# Patient Record
Sex: Male | Born: 1935 | Race: White | Hispanic: No | Marital: Single | Smoking: Former smoker
Health system: Southern US, Community
[De-identification: ages and names within clinical notes are randomized; demographics above are authoritative.]

## PROBLEM LIST (undated history)

## (undated) DIAGNOSIS — Z7901 Long term (current) use of anticoagulants: Secondary | ICD-10-CM

## (undated) DIAGNOSIS — I341 Nonrheumatic mitral (valve) prolapse: Secondary | ICD-10-CM

## (undated) DIAGNOSIS — I472 Ventricular tachycardia, unspecified: Secondary | ICD-10-CM

## (undated) DIAGNOSIS — E785 Hyperlipidemia, unspecified: Secondary | ICD-10-CM

## (undated) DIAGNOSIS — N400 Enlarged prostate without lower urinary tract symptoms: Secondary | ICD-10-CM

## (undated) HISTORY — DX: Nonrheumatic mitral (valve) prolapse: I34.1

## (undated) HISTORY — DX: Long term (current) use of anticoagulants: Z79.01

## (undated) HISTORY — DX: Benign prostatic hyperplasia without lower urinary tract symptoms: N40.0

## (undated) HISTORY — DX: Ventricular tachycardia: I47.2

## (undated) HISTORY — DX: Ventricular tachycardia, unspecified: I47.20

## (undated) HISTORY — PX: APPENDICO-VESICOSTOMY CUTANEOUS: SUR39

## (undated) HISTORY — DX: Hyperlipidemia, unspecified: E78.5

---

## 2000-08-28 ENCOUNTER — Ambulatory Visit (HOSPITAL_COMMUNITY): Admission: RE | Admit: 2000-08-28 | Discharge: 2000-08-28 | Payer: Self-pay | Admitting: *Deleted

## 2001-04-25 ENCOUNTER — Emergency Department (HOSPITAL_COMMUNITY): Admission: EM | Admit: 2001-04-25 | Discharge: 2001-04-25 | Payer: Self-pay | Admitting: Emergency Medicine

## 2001-06-17 ENCOUNTER — Emergency Department (HOSPITAL_COMMUNITY): Admission: EM | Admit: 2001-06-17 | Discharge: 2001-06-17 | Payer: Self-pay | Admitting: Emergency Medicine

## 2001-08-17 ENCOUNTER — Ambulatory Visit (HOSPITAL_COMMUNITY): Admission: RE | Admit: 2001-08-17 | Discharge: 2001-08-17 | Payer: Self-pay | Admitting: Cardiovascular Disease

## 2001-08-19 ENCOUNTER — Ambulatory Visit (HOSPITAL_COMMUNITY): Admission: RE | Admit: 2001-08-19 | Discharge: 2001-08-19 | Payer: Self-pay | Admitting: Cardiovascular Disease

## 2001-08-19 HISTORY — PX: CARDIAC CATHETERIZATION: SHX172

## 2001-10-29 ENCOUNTER — Encounter: Payer: Self-pay | Admitting: Cardiothoracic Surgery

## 2001-11-03 ENCOUNTER — Encounter (INDEPENDENT_AMBULATORY_CARE_PROVIDER_SITE_OTHER): Payer: Self-pay | Admitting: *Deleted

## 2001-11-03 ENCOUNTER — Encounter: Payer: Self-pay | Admitting: Cardiothoracic Surgery

## 2001-11-03 ENCOUNTER — Inpatient Hospital Stay (HOSPITAL_COMMUNITY): Admission: RE | Admit: 2001-11-03 | Discharge: 2001-11-08 | Payer: Self-pay | Admitting: Cardiothoracic Surgery

## 2001-11-03 HISTORY — PX: CORONARY STENT PLACEMENT: SHX1402

## 2001-11-04 ENCOUNTER — Encounter: Payer: Self-pay | Admitting: Cardiothoracic Surgery

## 2001-11-05 ENCOUNTER — Encounter: Payer: Self-pay | Admitting: Cardiothoracic Surgery

## 2003-02-01 ENCOUNTER — Encounter: Admission: RE | Admit: 2003-02-01 | Discharge: 2003-03-15 | Payer: Self-pay | Admitting: Internal Medicine

## 2009-12-26 ENCOUNTER — Ambulatory Visit: Payer: Self-pay | Admitting: Cardiology

## 2010-01-23 ENCOUNTER — Ambulatory Visit: Payer: Self-pay | Admitting: Cardiology

## 2010-02-20 ENCOUNTER — Ambulatory Visit: Payer: Self-pay | Admitting: Cardiology

## 2010-03-01 ENCOUNTER — Encounter: Admission: RE | Admit: 2010-03-01 | Discharge: 2010-03-01 | Payer: Self-pay | Admitting: Internal Medicine

## 2010-03-01 IMAGING — US US RENAL
1 series · 14 of 25 positions shown · non-contrast
Comparison: None.

CLINICAL DATA: 74-year-old with hematuria.  Evaluate for renal
abscess.

RENAL/URINARY TRACT ULTRASOUND COMPLETE

[Series 1: us renal · 0.22mm/px · 14 of 34 slices shown]
[im 1/34]
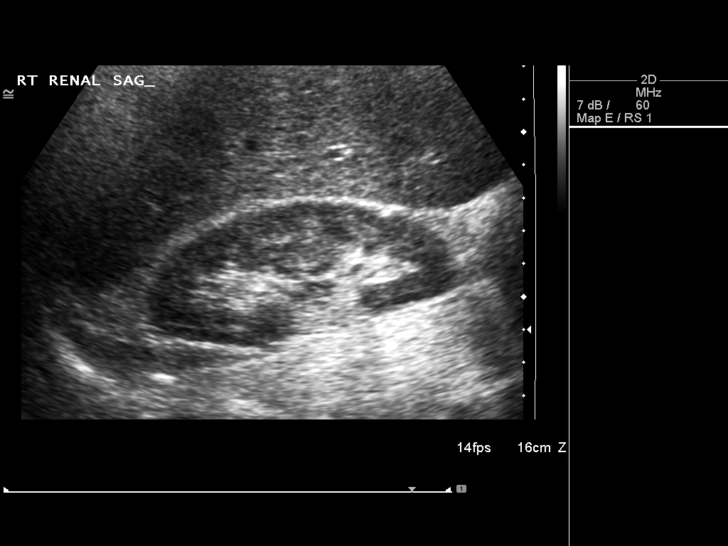
[im 3/34]
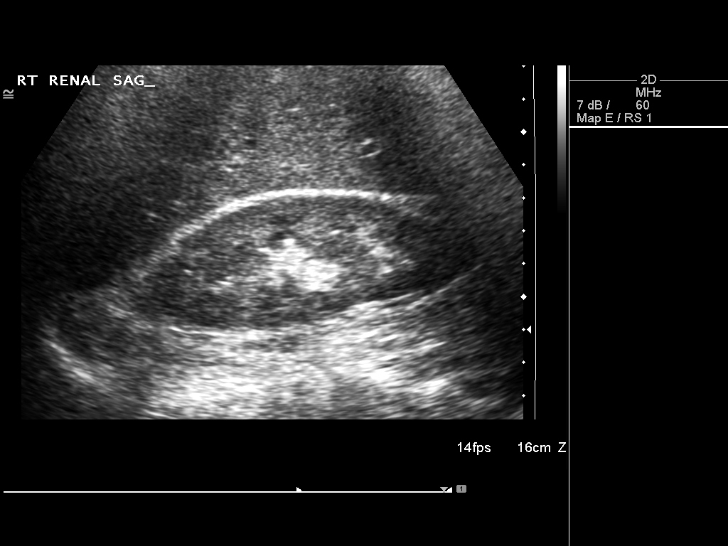
[im 6/34]
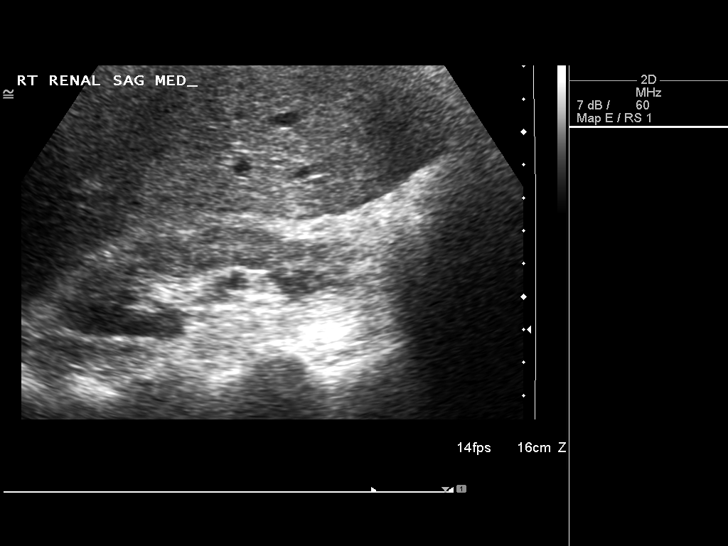
[im 9/34]
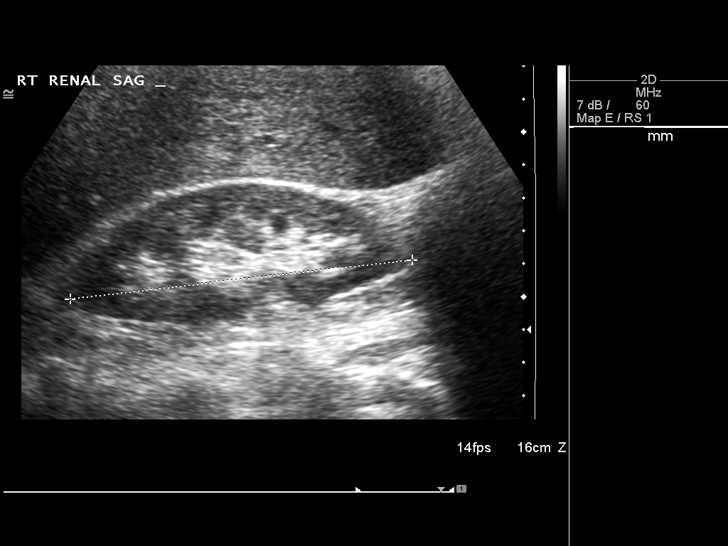
[im 12/34]
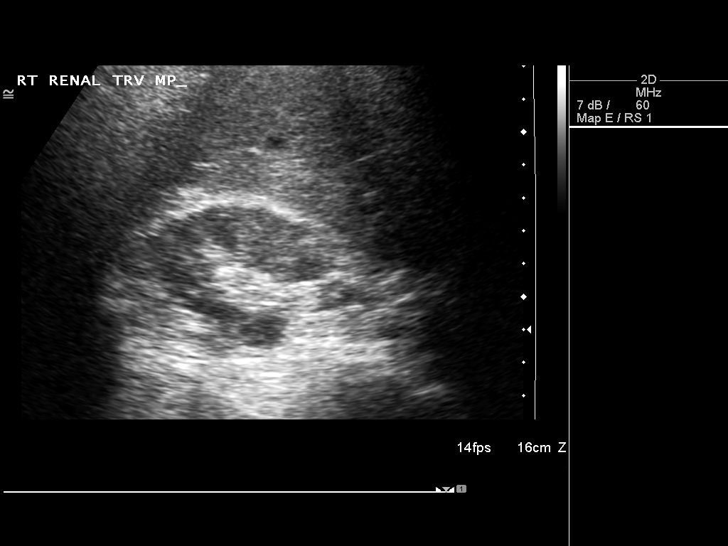
[im 13/34]
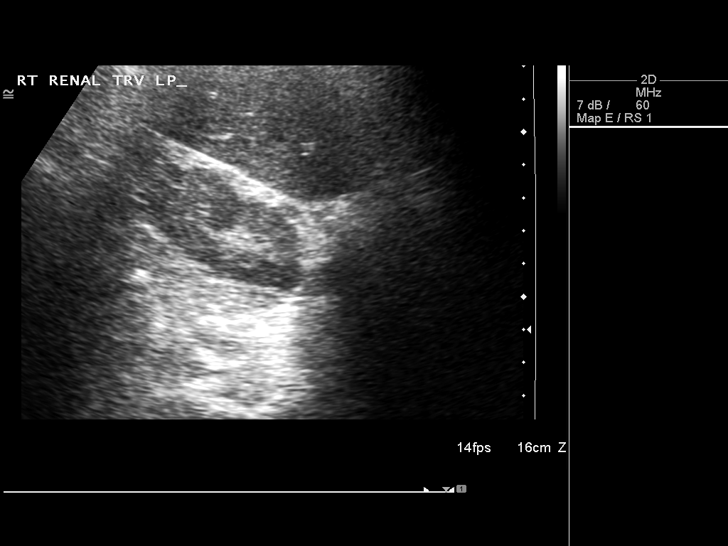
[im 16/34]
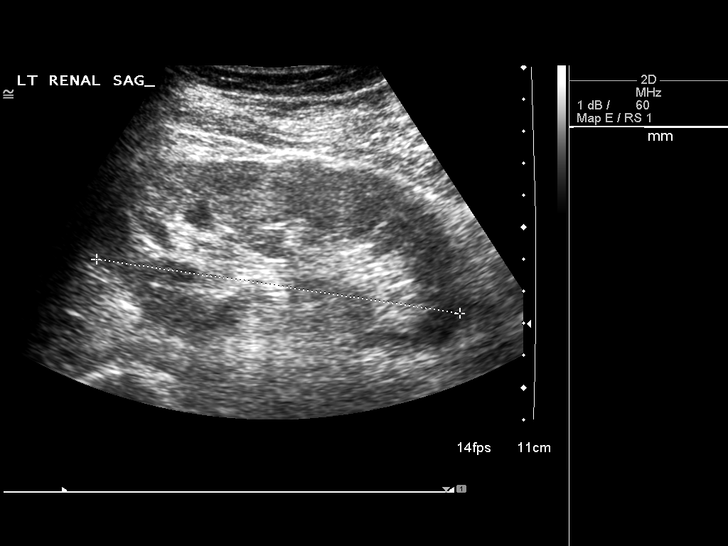
[im 18/34]
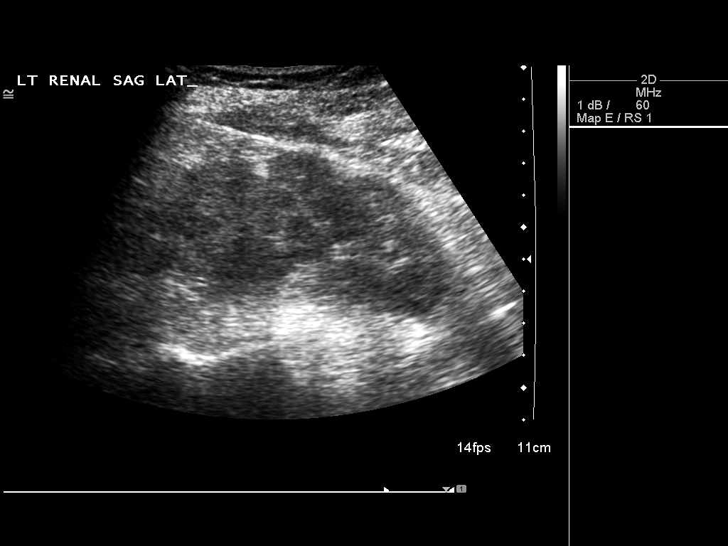
[im 21/34]
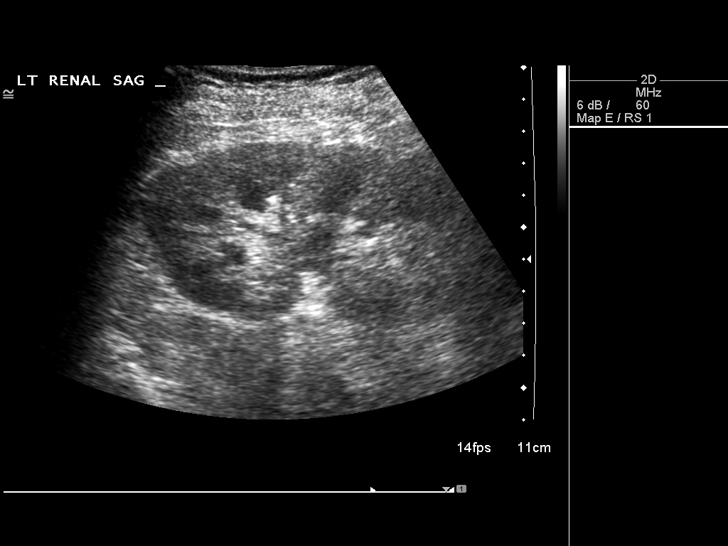
[im 23/34]
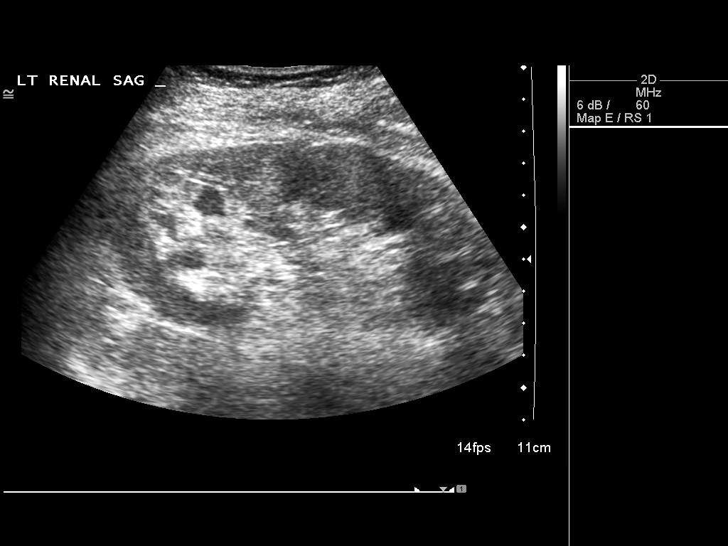
[im 25/34]
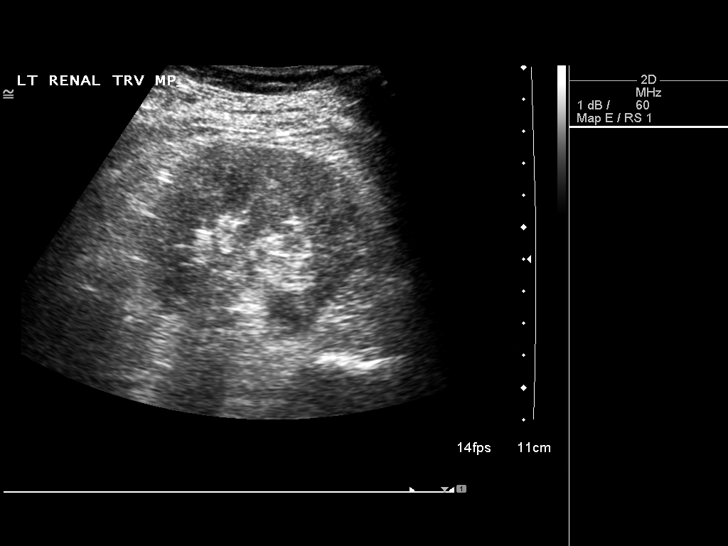
[im 28/34]
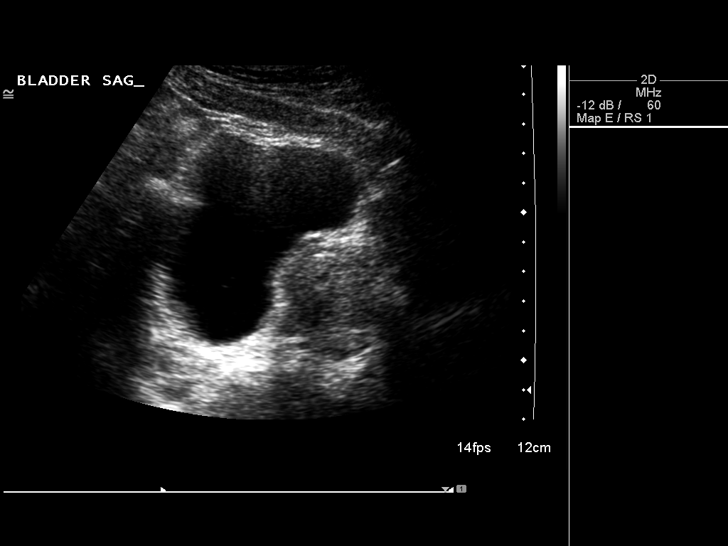
[im 31/34]
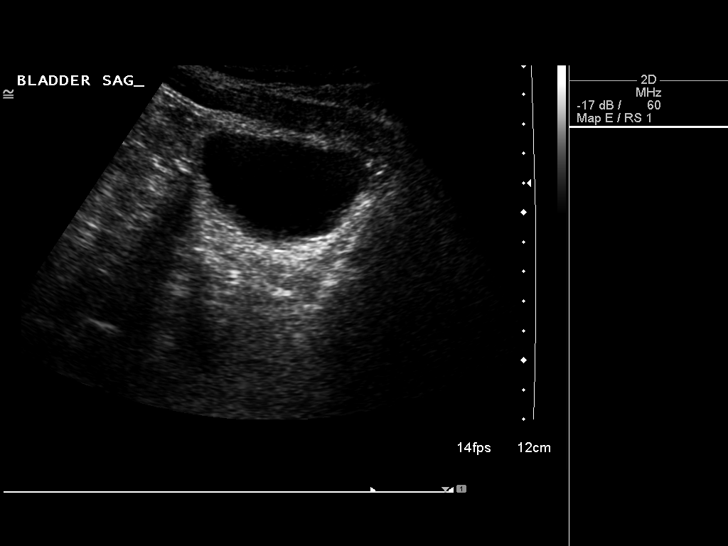
[im 34/34]
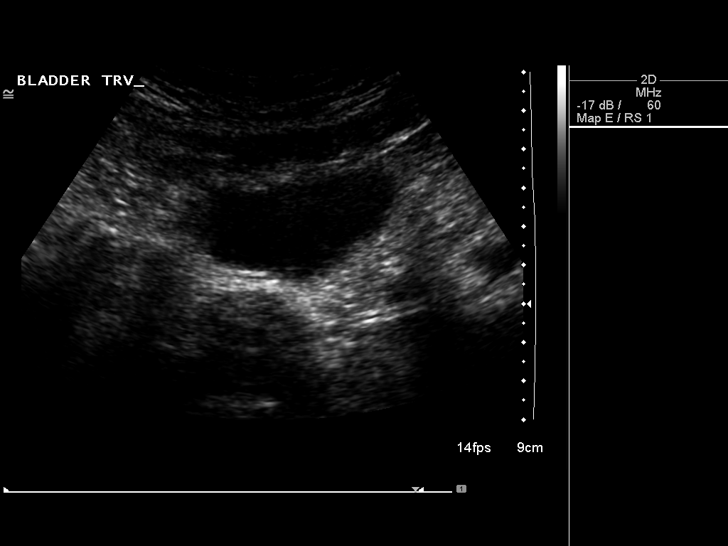

[14 of 25 positions shown; findings below may reference images not displayed]

FINDINGS: Right Kidney:  No hydronephrosis.  Well-preserved cortex.  Normal
size and parenchymal echotexture without focal abnormalities.
Renal length is 10.5 cm.

Left Kidney:  No hydronephrosis.  Well-preserved cortex.  Normal
size and parenchymal echotexture without focal abnormalities.
Renal length is 11.5 cm.

Bladder:  The bladder appears unremarkable for its degree of
distension.  Moderate enlargement prostate gland is noted.  The
prostate measures approximately 4.6 x 4.1 x 5.1 cm.
IMPRESSION: 1.  Normal renal ultrasound.  No evidence of renal abscess.
2.  Moderate enlargement of the prostate gland.
3.  Persistent unexplained hematuria may require further
evaluation.

## 2010-03-20 ENCOUNTER — Ambulatory Visit: Payer: Self-pay | Admitting: Cardiovascular Disease

## 2010-04-20 ENCOUNTER — Ambulatory Visit: Payer: Self-pay | Admitting: Cardiovascular Disease

## 2010-05-24 ENCOUNTER — Ambulatory Visit: Payer: Self-pay | Admitting: Cardiology

## 2010-06-12 ENCOUNTER — Ambulatory Visit: Payer: Self-pay | Admitting: Cardiovascular Disease

## 2010-07-06 ENCOUNTER — Other Ambulatory Visit (INDEPENDENT_AMBULATORY_CARE_PROVIDER_SITE_OTHER): Payer: Medicare Other

## 2010-07-06 DIAGNOSIS — Z954 Presence of other heart-valve replacement: Secondary | ICD-10-CM

## 2010-07-06 DIAGNOSIS — I059 Rheumatic mitral valve disease, unspecified: Secondary | ICD-10-CM

## 2010-07-20 ENCOUNTER — Other Ambulatory Visit (INDEPENDENT_AMBULATORY_CARE_PROVIDER_SITE_OTHER): Payer: Medicare Other

## 2010-07-20 DIAGNOSIS — Z954 Presence of other heart-valve replacement: Secondary | ICD-10-CM

## 2010-07-20 DIAGNOSIS — I059 Rheumatic mitral valve disease, unspecified: Secondary | ICD-10-CM

## 2010-08-03 ENCOUNTER — Other Ambulatory Visit (INDEPENDENT_AMBULATORY_CARE_PROVIDER_SITE_OTHER): Payer: Medicare Other | Admitting: *Deleted

## 2010-08-03 ENCOUNTER — Encounter: Payer: Self-pay | Admitting: *Deleted

## 2010-08-03 ENCOUNTER — Ambulatory Visit: Payer: Self-pay | Admitting: *Deleted

## 2010-08-03 DIAGNOSIS — Z952 Presence of prosthetic heart valve: Secondary | ICD-10-CM | POA: Insufficient documentation

## 2010-08-03 DIAGNOSIS — Z7901 Long term (current) use of anticoagulants: Secondary | ICD-10-CM

## 2010-08-03 DIAGNOSIS — I059 Rheumatic mitral valve disease, unspecified: Secondary | ICD-10-CM

## 2010-08-03 LAB — POCT INR: INR: 3.5

## 2010-08-03 NOTE — Patient Instructions (Signed)
Pt was instructed to stay on the same dose and increase his greens; will reck in 2 weeks; pt verbalized an understanding

## 2010-08-03 NOTE — Progress Notes (Signed)
erroneous

## 2010-08-03 NOTE — Telephone Encounter (Signed)
This encounter was created in error - please disregard.

## 2010-09-04 ENCOUNTER — Ambulatory Visit (INDEPENDENT_AMBULATORY_CARE_PROVIDER_SITE_OTHER): Payer: Medicare Other | Admitting: *Deleted

## 2010-09-04 DIAGNOSIS — I059 Rheumatic mitral valve disease, unspecified: Secondary | ICD-10-CM

## 2010-09-04 LAB — POCT INR: INR: 3.5

## 2010-09-25 ENCOUNTER — Ambulatory Visit (INDEPENDENT_AMBULATORY_CARE_PROVIDER_SITE_OTHER): Payer: Medicare Other | Admitting: *Deleted

## 2010-09-25 DIAGNOSIS — I059 Rheumatic mitral valve disease, unspecified: Secondary | ICD-10-CM

## 2010-09-28 NOTE — Cardiovascular Report (Signed)
[place]. [facility]  Patient:    Terry [name], Terry [name] Visit Number: [id number] MRN: [id number]          Service Type: CAT Location: [facility] 2853 01 Attending Physician:  Koren Bound Dictated by:   [name]., M.D. Proc. Date: 08/19/01 Admit Date:  08/19/2001   CC:         Ramon Dredge B. Tyrone Sage, M.D.  [name]., M.D.  Cardiac Catheterization Laboratory   Cardiac Catheterization  INDICATIONS: The patient is a 75 year old gentleman with a history of mitral valve prolapse - status post mitral valve repair approximately five years ago at another hospital. He presented to me a year or so ago with at least moderate mitral regurgitation. He has had some progressive enlargement of his left ventricle and is referred for a heart catheterization for further evaluation.  His recent echocardiograms reveal significant mitral regurgitation. A recent transesophageal echocardiogram reveals prolapse particularly of the posterior leaflet. There is at least moderate to severe mitral regurgitation.  The patient was referred for a right and left heart catheterization for further evaluation.  The right femoral artery and the right femoral vein were easily cannulated using a modified Seldinger technique.  HEMODYNAMICS: Right atrial pressure was a mean of 3, right ventricular pressure is 30/4, pulmonary artery pressure is 28/9 with a mean of 18. Pulmonary capillary wedge pressure is a mean of 10.  Left ventricular pressure is 89/8, the aortic pressure is 89/50.  ANGIOGRAPHY: The left main coronary artery is smooth and normal.  The left anterior descending artery is essentially normal. There are a few minor luminal irregularities in the mid LAD. The LAD reaches around the apex and supplies the inferoapical wall. There is a large diagonal branch which arises fairly early. This diagonal branch is normal.  There is a large ramus intermedius  branch which is fairly normal. There is a high marginal branch which arises very early on from the circumflex artery. This branch is fairly normal.  The left circumflex artery continues around the AV groove and gives off several smaller posterolateral branches. All of these branches are normal.  The right coronary artery is relatively smooth and normal. It is large and dominant. The posterior descending artery and the posterolateral segment arteries are normal.  LEFT VENTRICULOGRAM: The left ventricular size is at the upper limits of normal. There is moderate mitral regurgitation. The left atrium is significantly enlarged. There is no evidence of aortic stenosis.  COMPLICATIONS: None.  CONCLUSIONS: 1. Essentially smooth and normal coronary arteries. 2. Overall well preserved left ventricular systolic function with at least    moderate mitral regurgitation. The mitral regurgitation actually appeared    to be milder than it had been on the previous echocardiograms. We will    review the case with the patient and with Dr. Tyrone Sage. It is likely    that the patient will need a re-do mitral valve repair or a mitral valve    replacement in the near future. Dictated by:   [name]., M.D. Attending Physician:  Koren Bound DD:  08/19/01 TD:  08/19/01 Job: [id number]

## 2010-09-28 NOTE — Procedures (Signed)
[place]. [facility]  Patient:    ELVIN, MCCARTIN Visit Number: [id number] MRN: [id number]          Service Type: SUR Location: 2000 2019 01 Attending Physician:  Waldo Laine Dictated by:   Judie Petit, M.D. Proc. Date: 11/03/01 Admit Date:  11/03/2001                             Procedure Report  INDICATIONS: Mr. Junker is a 75 year old male, who presents today for mitral valve replacement by Dr. Sheliah Plane.  A TEE probe will be used for pre and post mitral valve replacement.  PROCEDURE: Pre-cardiopulmonary bypass transesophageal echocardiogram.  ANESTHESIOLOGIST: Judie Petit, M.D.  DESCRIPTION OF PROCEDURE: The patient was brought to the holding area on the morning of surgery.  A pulmonary artery catheter and A line catheter were inserted under local anesthesia without difficulty.  The patient was moved to the operating room for routine induction of general anesthesia.  The patient was intubated.  Following intubation of the trachea the TEE probe was passed oropharyngeally into the stomach and then withdrawn for imaging of the cardiac structures.  LEFT VENTRICLE - Papillary muscles were well outlined.  No masses were noted within the left ventricular chamber.  Left ventricular size appeared within normal limits.  The patient appeared to have relatively normal ejection fraction.  AORTIC VALVE - The aortic valve was trileaflet in nature.  On Doppler examination there was no evidence of regurgitation.  MITRAL VALVE - There appeared to be thickening of the mitral valve.  There appeared to be significant prolapsing of the posterior leaflet as well as some prolapsing of the anterior leaflet.  There was moderate to severe mitral regurgitation on Doppler examination.  LEFT ATRIUM - The left atrium was dilated.  The left atrial appendage was normal.  There were no masses noted.  The atrial septum appeared intact.  RIGHT  VENTRICLE - The right ventricle and tricuspid valve was somewhat difficult to visualize.  RIGHT ATRIUM - The right atrium appeared normal.  The patient was placed on cardiopulmonary bypass by Dr. Tyrone Sage.  Hypothermia was instituted.  The diseased mitral valve was excised and replaced with a #35 St. Jude valve.  De-airing maneuvers were carried out at the end of the procedure.  The patient was subsequently rewarmed and separated from cardiopulmonary bypass with the initial attempt.  CARDIOPULMONARY BYPASS TRANSESOPHAGEAL ECHOCARDIOGRAM: A limited examination.  MITRAL VALVE - The #35 St. Jude valve appeared to function with no significant obstruction to flow.  Overall the valve appeared to be seated well and function satisfactorily.  LEFT VENTRICLE - In the early bypass period the left ventricle chamber revealed low volume; however, with replacement of adequate volume the contractile pattern improved.  The rest of the cardiac examination was as previously described. Dictated by:   Judie Petit, M.D. Attending Physician:  Waldo Laine DD:  11/03/01 TD:  11/05/01 Job: [id number]

## 2010-09-28 NOTE — Discharge Summary (Signed)
[place]. [facility]  Patient:    Terry [name], Terry [name] Visit Number: [id number] MRN: [id number]          Service Type: SUR Location: 2000 2019 01 Attending Physician:  Waldo Laine Dictated by:   Tollie Pizza Collins, P.A.-C. Admit Date:  11/03/2001 Discharge Date: 11/08/2001   CC:         [name]., M.D.  Rodrigo Ran, M.D.   Discharge Summary  PRIMARY ADMITTING DIAGNOSES:  Mitral regurgitation status post previous mitral valve repair in 1995.  ADDITIONAL DIAGNOSES: 1. Hyperlipidemia, diet controlled. 2. History of previous ventricular tachycardia converted to normal sinus    rhythm with cardioversion. 3. Osteoporosis. 4. Benign prostatic hypertrophy. 5. 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  PROCEDURE PERFORMED: 1. Redo median sternotomy. 2. Mitral valve replacement with #35 St. Jude mitral valve. 3. Closure of left atrial appendage.  HISTORY:  The patient is a 75 year old white male who is status post previous mitral valve repair in [place] in 1995.  At that time the anterior and posterior leaflets were repaired but no ring was placed.  He has been followed by Dr. Elease Hashimoto and on most recent echocardiogram was found to have posterior leaflet flail and prolapse of the anterior leaflet.  There has been an increase in left ventricular size and worsening of his mitral regurgitation. He was evaluated in the office by Dr. Tyrone Sage and it was felt that his significant mitral regurgitation warranted mitral valve replacement.  HOSPITAL COURSE:  He was admitted on June 24.  Taken to the operating room where he underwent redo median sternotomy with mitral valve replacement with a #35 St. Jude valve.  He also underwent closure of the left atrial appendage. He tolerated procedure well and was transferred to the SICU in stable condition.  He was extubated shortly after  surgery and was hemodynamically stable on postoperative day #1.  At that time he was started on Coumadin and was transferred to the floor.  Postoperatively he has done well.  He has maintained normal sinus rhythm.  He has been afebrile and all other vital signs have been stable.  He has been ambulating in the halls without difficulty with cardiac rehabilitation phase I.  He has been weaned off supplemental oxygen and is maintaining O2 saturations over 90% on room air. He has been mildly anemic during her postoperative course; however, his hemoglobin and hematocrit have remained relatively stable and at present are at 8.3 and 24.6 respectively.  He is tolerating a regular diet and having normal bowel and bladder function.  His incisions are healing well.  He has been appropriately anticoagulated with Coumadin.  It is felt if he remains stable he will be ready for discharge home on November 07, 2001.  DISCHARGE MEDICATIONS: 1. Toprol XL 25 mg q.d. 2. Aspirin 81 mg q.d. 3. Tylox one to two q.4h. p.r.n. for pain. 4. Amiodarone 400 mg b.i.d. 5. Coumadin dose to be determined by PT and INR on day of discharge.  DISCHARGE INSTRUCTIONS:  He is to refrain from driving, heavy lifting, or strenuous activity.  He may continue a low fat, low sodium diet.  He is asked to shower daily and clean his incisions with soap and water.  DISCHARGE FOLLOWUP:  He will see Dr. Elease Hashimoto in the office in the next two weeks and have chest x-ray at that time.  He is to have PT and  INR drawn first of next week and call to Dr. Namon Cirri office for further Coumadin dosing. CVTS office will call and schedule an appointment with Dr. Tyrone Sage in three weeks.  He is asked to bring his chest x-ray to this appointment and he is asked to call if he has any problems in the interim. Dictated by:   Tollie Pizza Collins, P.A.-C. Attending Physician:  Waldo Laine DD:  11/06/01 TD:  11/09/01 Job: [id number]

## 2010-09-28 NOTE — H&P (Signed)
[place]. [facility]  Patient:    Terry [name], Terry [name] Visit Number: [id number] MRN: [id number]          Service Type: SUR Location: 2300 2310 01 Attending Physician:  Waldo Laine Dictated by:   Dominica Severin, P.A. Admit Date:  11/03/2001   CC:         CVTS office   History and Physical  DATE OF BIRTH:  1936-04-15  CHIEF COMPLAINT:  Mitral regurgitation status post repair of mitral valve in 1995.  HISTORY OF PRESENT ILLNESS:  This is a 75 year old Caucasian male who was referred by Dr. Elease Hashimoto for evaluation of mitral valve regurgitation.  He is status post mitral valve repair in [place] in 1995 with repair of the anterior and posterior leaflets.  There is no ring placed.  He now has posterior leaflet flailing and prolapsing of the anterior leaflet.  He has a long-standing history of mitral valve prolapse after he developed a ruptured cord and underwent mitral valve repair in 1995 at [facility] in [place].  He had been followed by Dr. Elease Hashimoto since returning to the [place] area.  Serial echocardiograms showed increase in left ventricular size and worsening of mitral regurgitation.  He was relatively asymptomatic. He walks five times a week three miles a day without any significant shortness of breath.  He denies any dyspnea on exertion, resting shortness of breath, chest pain, lower extremity edema, syncope, or exertional orthopnea.  He has had several episodes of presyncope in 1995 right after surgery, but has not had any since and he has had two episodes of significant tachycardia which he was told was ventricular in origin, but none since being on Toprol.  He denies any angina.  Denies any polyuria, nocturia, hematuria, abdominal fullness, hematochezia, cough, or sputum production.  PAST MEDICAL HISTORY: 1. Mitral valve repair status post cord A tendon rupture. 2. Osteoporosis. 3. History of ventricular  tachycardia requiring shocks, resolved. 4. Benign prostatic hypertrophy. 5. Hyperlipidemia, diet controlled.  PAST SURGICAL HISTORY: 1. Mitral valve repair in 1995 in [place]. 2. Appendectomy in 1960s. 3. Status post left green stick fracture as a child.  MEDICATIONS: 1. Lotensin 5 mg daily. 2. Toprol 100 mg daily. 3. Digoxin 250 mcg daily. 4. Actonel 35 mg q.weekly. 5. Aspirin 81 mg daily.  ALLERGIES:  No known drug allergies.  REVIEW OF SYSTEMS:  Please see HPI for significant positives.  Otherwise, the patient denies any changes in warts, moles, recent rashes, eczema, seborrhea, psoriasis, easy bruising or bleeding.  Denies any brittleness in his nails, ridging, pitting, or curvature.  Denies any difficulty with his lymph nodes such as enlargement or pain.  He denies any fractures, dislocations, sprain, arthritis, myositis, pain, swelling, muscle weakness, wasting, or night cramps.  Denies any history of anemia or spontaneous bleeding.  Denies any history of diabetes, thyroid problems.  He does state he has a history of painless migraines.  Denies any current headaches, trauma, dizziness, fainting, or convulsions.  He wears glasses.  Denies any visual loss or double vision.  He has constant tinnitus all the time which he attributes to using a shotgun frequently as a child.  He has some postnasal drainage.  Otherwise, denies any epistaxis.  Denies any soreness in his mouth, tongue, or problems with his teeth.  He sees a Education officer, community regularly.  Denies any sore throat or difficulty swallowing.  He has a history of vertebral AVM.  Lungs and heart: Please see HPI.  GASTROINTESTINAL:  Denies any changes in appetite, weight changes, difficulty swallowing, nausea, abdominal pain, bleeding in his bowels.  He does have some difficulty starting his stream with urination.  He has a history of benign prostatic hypertrophy.  He denies any weakness or numbness in any of his extremities on  one side of his body more than the other or any history of TIAs or CVAs or amaurosis fugax.  He denies any history of kidney disease.  FAMILY HISTORY:  The patients mother is deceased.  She had a history of rheumatoid arthritis.  The patients father is deceased at 59 chronic lymphocytic leukemia.  Brother is alive at 51 years old.  He has a history of tuberculosis.  He has one brother who passed away at 9 in Oct 03, 1983 of hepatitis C.  Otherwise, there is no history of coronary artery disease, pulmonary disease, diabetes, CVA, or TIAs.  SOCIAL HISTORY:  The patient is single.  Did not have any children.  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  He drinks an occasional one glass of wine a month.  He denies any tobacco use currently. He quit in 10-02-64.  He smoked less than one pack per day for seven to eight years.  PHYSICAL EXAMINATION  VITAL SIGNS:  Blood pressure 118/80, pulse 76, respirations 16.  GENERAL:  This is a 75 year old Caucasian male who is sitting upright on the examination table in our office today in no acute distress.  He is alert and oriented x3.  HEENT:  Head is normocephalic, atraumatic.  Eyes are PERRLA/EOMI without cataracts, glaucoma, or macular degeneration.  He does wear glasses.  NECK:  Supple without JVD, bruits.  He does have bilateral soft thrills throughout his neck.  No lymphadenopathy.  CHEST:  Symmetrical on inspiration.  Demonstrates a well healed sternotomy incision.  LUNGS:  Without wheezes, rhonchi, rales, lymphadenopathy.  HEART:  Regular rate and rhythm.  He has a 3/6 systolic ejection murmur notable in the mitral area that radiates up into the axilla.  There are no rubs or gallops.  ABDOMEN:  Soft, nontender with positive bowel sounds x4 quadrants without any masses or bruits.  GENITOURINARY:  Deferred.  RECTAL:  Deferred.  EXTREMITIES:  Without clubbing, cyanosis, edema, or ulcerations.   The  temperature is warm.  He has 2+ carotid, femoral, popliteal, dorsalis pedis, and posterior tibial pulses bilaterally.  NEUROLOGIC:  There are no focal deficits.  He has a steady gait.  He has deep tendon reflexes that are 2+ and muscle strength 4+ bilaterally.  ASSESSMENT AND PLAN:  Progressive mitral valve disease.  He is for redo mitral valve replacement and/or repair on November 03, 2001 at [facility].  Dr. Tyrone Sage has seen and evaluated this patient prior to this admission and has explained the risks and benefits of procedure and the patient has agreed to continued. Dictated by:   Dominica Severin, P.A. Attending Physician:  Waldo Laine DD:  10/29/01 TD:  10/30/01 Job: [id number]

## 2010-09-28 NOTE — Op Note (Signed)
[facility]  Patient:    Terry [name], Terry [name] Visit Number: [id number] MRN: [id number]          Service Type: SUR Location: 2000 2019 01 Attending Physician:  Waldo Laine Dictated by:   Gwenith Daily Tyrone Sage, M.D. Proc. Date: 11/03/01 Admit Date:  11/03/2001   CC:         [name]., M.D.   Operative Report  PREOPERATIVE DIAGNOSIS:  Recurrent severe mitral regurgitation, status post mitral valve repair.  POSTOPERATIVE DIAGNOSIS:  Recurrent severe mitral regurgitation, status post mitral valve repair.  OPERATION PERFORMED:  Mitral valve replacement with #35 St. Jude mechanical mitral valve prosthesis and closure of left atrial appendage.  SURGEON:  Gwenith Daily. Tyrone Sage, M.D.  FIRST ASSISTANT:  Gina H. Thomasena Edis, P.A.  ANESTHESIA:  General.  INDICATIONS FOR PROCEDURE:  The patient is a 75 year old male who in 1995 underwent mitral valve repair while living in [place].  At that time a repair was performed on the posterior leaflet, no mitral valve ring was placed.  The patient subsequently moved to the [facility] and has been followed by Dr. Delane Ginger and noted to have increasing mitral regurgitation with symptoms.  In addition serial echocardiogram showed some enlarging of the ventricle and decrease in left ventricular function.  Because of these findings, the patient was referred for surgery.  Cardiac catheterization was performed which demonstrated clear coronary arteries.  The patient each time evaluated had been in sinus rhythm and had not been on Coumadin.  After reviewing the cath films and echocardiogram, the risks and options were discussed with the patient including the possible need for mitral valve replacement and lifelong Coumadin therapy.  The patient had no contraindications to Coumadin and was agreeable with this approach.  DESCRIPTION OF PROCEDURE:  With Swan-Ganz and arterial line monitors in  place, the patient underwent general endotracheal anesthesia without incident.  A transesophageal echocardiogram probe was placed by Dr. Randa Evens.  The skin of the chest and legs was prepped with Betadine and draped in the usual sterile manner.  With a sagittal saw, a median sternotomy was performed.  Previously, the pericardium had not been closed.  Carefully, the right ventricle was dissected off the posterior table of the sternum.  The right atrium and ascending aorta were dissected free.  The anterior wall of the heart was dissected free, the posterior left ventricle was left with the superior inferior vena cava freed.  The patient was then systemically heparinized.  The ascending aorta was cannulated.  The cannulas were placed through the right atrium into the superior and inferior vena cava.  A retrograde cardioplegia catheter was placed in the coronary sinus through a separate pursestring suture in the right atrium.  The patient was placed on cardiopulmonary bypass at 2.4 L per minute per meter squared.  The patients body temperature was then cooled to 30 degrees.  The aortic crossclamp was applied.  500 cc of cold blood potassium cardioplegia was administered antegrade with rapid diastolic arrest of the heart.  The atrium was then opened through the previous left atriotomy suture line.  The left atrium was enlarged.  The mitral valve was inspected.  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 Jude mechanical valve.  The posterior leaflet was fibrotic and fused.  Areas of previous repair could be noted between 3 and 6 oclock on the valve annulus was a significant amount of calcification.  The hingepoint of the anterior leaflet, it was decided that the valve was not repairable.  The anterior leaflet was excised saving two paddles of anterior leaflet with chordal attachments in place.  A portion of  the posterior leaflet was also left intact.  The area of significant calcification was carefully debrided to the extent to allow seating of the valve.  Care was taken not to disrupt the atrioventricular junction.  The valve was then sized for a #35 St. Jude mechanical mitral valve prosthesis.  A pledgeted #2 Tycron sutures were placed circumferentially with the pledgets on the atrial side.  The care was taken throughout the procedure to remove all, any loose calcific debris.  The valve was then seated in place and secured.  There were free movements of the leaflets.  The valve seating and pledgets were all inspected including using a dental mirror.  A small Fogarty catheter was placed through the central lumen of the mitral valve.  The left aortotomy was closed with a horizontal mattress 3-0 Prolene suture.  Prior to complete closure of the suture line, the heart was allowed to passively fill.  The Foley catheter through the mitral valve was then removed and the aortotomy closure completed.  The head was put in the down position.  Further deairing maneuvers were carried out with the aortic root vent in place.  The aortic crossclamp was removed with a total crossclamp time of 132 minutes.  A 16 gauge needle was introduced into the left ventricular apex to further deair the heart.  The patient required electrical defibrillation and returned to a sinus rhythm.  Body temperature rewarmed to 37 degrees. The patient was then ventilated and weaned from cardiopulmonary bypass without difficulty.  He remained hemodynamically stable.  He was decannulated in the usual fashion.  Protamine sulfate was administered.  With the operative field hemostatic, two atrial and two ventricular pacing wires were applied.  Mediastinal tissue was closed over the ascending aorta.  There was not sufficient tissue to close over the right ventricle with exception of pleural tissue.  Two mediastinal tubes were left in  place.  The sternum was closed with #6 stainless steel wire, the fascia was closed with interrupted 0  Vicryl and running 3-0 Vicryl in the subcutaneous tissue and a 4-0 subcuticular stitch in the skin edges.  Dry dressings were applied.  The sponge and needle counts were reported as correct at the completion of the procedure.  Please note while the atrium was opened and inspected, there was no free clot both on TEE and direct observation.  The left atrial appendage was sufficient size to decrease the possibility of postoperative clots.  Using a running 4-0 Prolene suture, the left atrial appendage opening was suture obliterated. Dictated by:   Gwenith Daily Tyrone Sage, M.D. Attending Physician:  Waldo Laine DD:  11/04/01 TD:  11/05/01 Job: [id number]

## 2010-10-09 ENCOUNTER — Encounter: Payer: Self-pay | Admitting: Internal Medicine

## 2010-10-16 ENCOUNTER — Ambulatory Visit (INDEPENDENT_AMBULATORY_CARE_PROVIDER_SITE_OTHER): Payer: Medicare Other | Admitting: *Deleted

## 2010-10-16 DIAGNOSIS — I059 Rheumatic mitral valve disease, unspecified: Secondary | ICD-10-CM

## 2010-10-30 ENCOUNTER — Ambulatory Visit: Payer: Medicare Other | Admitting: Cardiovascular Disease

## 2010-11-01 ENCOUNTER — Encounter: Payer: Self-pay | Admitting: *Deleted

## 2010-11-09 ENCOUNTER — Ambulatory Visit (INDEPENDENT_AMBULATORY_CARE_PROVIDER_SITE_OTHER): Payer: Medicare Other | Admitting: Cardiovascular Disease

## 2010-11-09 ENCOUNTER — Ambulatory Visit (INDEPENDENT_AMBULATORY_CARE_PROVIDER_SITE_OTHER): Payer: Medicare Other | Admitting: Cardiology

## 2010-11-09 ENCOUNTER — Encounter: Payer: Self-pay | Admitting: Cardiovascular Disease

## 2010-11-09 VITALS — BP 104/70 | HR 80 | Ht 72.0 in | Wt 161.4 lb

## 2010-11-09 DIAGNOSIS — I059 Rheumatic mitral valve disease, unspecified: Secondary | ICD-10-CM

## 2010-11-09 DIAGNOSIS — Z7901 Long term (current) use of anticoagulants: Secondary | ICD-10-CM

## 2010-11-09 DIAGNOSIS — Z954 Presence of other heart-valve replacement: Secondary | ICD-10-CM

## 2010-11-09 DIAGNOSIS — Z952 Presence of prosthetic heart valve: Secondary | ICD-10-CM

## 2010-11-09 LAB — POCT INR: INR: 2.8

## 2010-11-09 NOTE — Progress Notes (Signed)
Terry [name] Date of Birth  1935-08-13 [facility] [address]     [address] [place], [place]  [address] [phone]  Fax  [phone]  History of Present Illness:  Terry [name] is a day 75 year old gentleman with a history of mitral valve replacement. He has done very well. His INR Levels have been therapeutic. He has not had any problems.  Current Outpatient Prescriptions on File Prior to Visit  Medication Sig Dispense Refill  . ACTONEL 150 MG tablet Take 1 tablet by mouth every 30 (thirty) days.      . calcium carbonate (OS-CAL) 600 MG TABS Take 600 mg by mouth daily.        . Cholecalciferol (VITAMIN D3) 1000 UNITS tablet Take 1,000 Units by mouth daily.        . CRESTOR 20 MG tablet Take 1 tablet by mouth Daily.      . metoprolol (TOPROL-XL) 100 MG 24 hr tablet Take 150 mg by mouth Daily.      . multivitamin (THERAGRAN) per tablet Take 1 tablet by mouth daily.        . niacin 500 MG tablet Take 500 mg by mouth 2 (two) times daily with a meal.        . omeprazole (PRILOSEC OTC) 20 MG tablet Take 20 mg by mouth every other day.        . propranolol (INDERAL) 10 MG tablet Take 10 mg by mouth as needed.        . warfarin (COUMADIN) 5 MG tablet Take by mouth. As directed by the Coumadin Clinic         Allergies  Allergen Reactions  . Sulfa Drugs Cross Reactors     Past Medical History  Diagnosis Date  . Mitral valve prolapse   . Dyslipidemia   . Long-term (current) use of anticoagulants   . Hyperlipidemia   . Ventricular tachycardia   . Osteoporosis   . Benign prostatic hypertrophy     Past Surgical History  Procedure Date  . Cardiac catheterization 08/19/2001    Essentially smooth and normal coronary arteries  . Coronary stent placement 11/03/2001    Mitral valve replacement with #35 St. Jude mechanical mitral valve prosthesis and closure of  left atrial appendage  . Appendico-vesicostomy cutaneous     History  Smoking status   . Former Smoker  . Quit date: 05/13/1964  Smokeless tobacco  . Not on file    History  Alcohol Use     History reviewed. No pertinent family history.  Reviw of Systems:  Reviewed in the HPI.  All other systems are negative.  Physical Exam: BP 104/70  Pulse 80  Ht 6' (1.829 m)  Wt 161 lb 6.4 oz (73.211 kg)  BMI 21.89 kg/m2 The patient is alert and oriented x 3.  The mood and affect are normal.   Skin: warm and dry.  Color is normal.    HEENT:   the sclera are nonicteric.  The mucous membranes are moist.  The carotids are 2+ without bruits.  There is no thyromegaly.  There is no JVD.    Lungs: clear.  The chest wall is non tender.    Heart: regular rate with a mechanical S1 and S2.  There are no murmurs, gallops, or rubs. The PMI is not displaced.     Abdomin: good bowel sounds.  There is no guarding or rebound.  There is no hepatosplenomegaly or tenderness.  There are no  masses.   Extremities:  no clubbing, cyanosis, or edema.  The legs are without rashes.  The distal pulses are intact.   Neuro:  Cranial nerves II - XII are intact.  Motor and sensory functions are intact.    The gait is normal.  Assessment / Plan:

## 2010-11-09 NOTE — Assessment & Plan Note (Signed)
He remains on Coumadin. His INR levels have been therapeutic. He is not having problems. I'll continue to see him every 6 months.

## 2010-11-12 ENCOUNTER — Other Ambulatory Visit: Payer: Self-pay | Admitting: Cardiology

## 2010-11-13 ENCOUNTER — Encounter: Payer: Medicare Other | Admitting: *Deleted

## 2010-11-13 ENCOUNTER — Telehealth: Payer: Self-pay | Admitting: *Deleted

## 2010-11-13 ENCOUNTER — Encounter: Payer: Self-pay | Admitting: Cardiovascular Disease

## 2010-11-13 NOTE — Telephone Encounter (Signed)
Faxed dr Dulce Sellar [phone] recommendation. See scanned paperwork. Cc to dr mark perini

## 2010-11-13 NOTE — Telephone Encounter (Signed)
escribe medication per fax request  

## 2010-11-16 ENCOUNTER — Encounter: Payer: Self-pay | Admitting: Cardiovascular Disease

## 2010-11-26 ENCOUNTER — Telehealth: Payer: Self-pay | Admitting: Cardiovascular Disease

## 2010-11-26 NOTE — Telephone Encounter (Signed)
[facility]NICO[facility]. PT IS HAVING A PROCEDURE AND SHE NEEDS TO GET DR NAHSER TO WRITE SCRIPT FOR PT TO GET LOVENOX IN LIEN OF HIS BLOODTHINNER. THIS HAS PREVIOUSLY DISCUSSED WITH DR Elease Hashimoto AND Jarold Song MD. PLEASE CALL BACK TODAY.

## 2010-11-26 NOTE — Telephone Encounter (Signed)
No date set yet for procedure/ colonoscopy but pt contacted and he does know how to do Lovenox bridge, re taught how to give and how often to give self sq injection, pt feels comfortable with inj. Nicole/ Dr Dulce Sellar nurse to call us when app set so we can order Lovenox and arrange f/u app for INR. All instructions reviewed with pt and i will re teach on final call. Terry Ramus RN

## 2010-12-07 ENCOUNTER — Telehealth: Payer: Self-pay | Admitting: *Deleted

## 2010-12-07 ENCOUNTER — Ambulatory Visit (INDEPENDENT_AMBULATORY_CARE_PROVIDER_SITE_OTHER): Payer: Medicare Other | Admitting: *Deleted

## 2010-12-07 DIAGNOSIS — I059 Rheumatic mitral valve disease, unspecified: Secondary | ICD-10-CM

## 2010-12-07 MED ORDER — ENOXAPARIN SODIUM 80 MG/0.8ML ~~LOC~~ SOLN: 70.0000 mg | Freq: Two times a day (BID) | SUBCUTANEOUS | Status: DC

## 2010-12-07 NOTE — Telephone Encounter (Signed)
Pt called and reviewed meds, coumadin to be held 12/29/10, start lovenox 70mg  12/31/10 BID only one dose morning of 01/02/11  For colonoscopy 01/03/11, will need inr 01/07/11 pt will restart coumadin 01/03/11 evening and will continue lovenox that evening if low risk of bleeding like no biopsies have been done, otherwise will start lovenox on morning of 01/04/11. Endo will advise, Coumadin clinic/ emily called and clarified dates. Pt verbalized understanding. Alfonso Ramus RN

## 2010-12-24 ENCOUNTER — Telehealth: Payer: Self-pay | Admitting: Cardiovascular Disease

## 2010-12-24 NOTE — Telephone Encounter (Signed)
Pt is scheduled for protime tomorrow and he is supposed to go off coumadin for colonoscopy and wants to know does he really need to come in to have protime check

## 2010-12-24 NOTE — Telephone Encounter (Signed)
Coumadin clinic contacted and they will call the pt back.

## 2010-12-25 ENCOUNTER — Encounter: Payer: Medicare Other | Admitting: *Deleted

## 2011-01-07 ENCOUNTER — Ambulatory Visit (INDEPENDENT_AMBULATORY_CARE_PROVIDER_SITE_OTHER): Payer: Medicare Other | Admitting: *Deleted

## 2011-01-07 DIAGNOSIS — I059 Rheumatic mitral valve disease, unspecified: Secondary | ICD-10-CM

## 2011-02-05 ENCOUNTER — Ambulatory Visit (INDEPENDENT_AMBULATORY_CARE_PROVIDER_SITE_OTHER): Payer: Medicare Other | Admitting: *Deleted

## 2011-02-05 DIAGNOSIS — I059 Rheumatic mitral valve disease, unspecified: Secondary | ICD-10-CM

## 2011-02-19 ENCOUNTER — Ambulatory Visit (INDEPENDENT_AMBULATORY_CARE_PROVIDER_SITE_OTHER): Payer: Medicare Other | Admitting: *Deleted

## 2011-02-19 DIAGNOSIS — I059 Rheumatic mitral valve disease, unspecified: Secondary | ICD-10-CM

## 2011-03-01 ENCOUNTER — Ambulatory Visit (INDEPENDENT_AMBULATORY_CARE_PROVIDER_SITE_OTHER): Payer: Medicare Other | Admitting: *Deleted

## 2011-03-01 DIAGNOSIS — I059 Rheumatic mitral valve disease, unspecified: Secondary | ICD-10-CM

## 2011-03-15 ENCOUNTER — Ambulatory Visit (INDEPENDENT_AMBULATORY_CARE_PROVIDER_SITE_OTHER): Payer: Medicare Other | Admitting: *Deleted

## 2011-03-15 DIAGNOSIS — I059 Rheumatic mitral valve disease, unspecified: Secondary | ICD-10-CM

## 2011-03-27 ENCOUNTER — Telehealth: Payer: Self-pay | Admitting: Cardiovascular Disease

## 2011-03-27 NOTE — Telephone Encounter (Signed)
Forwarded to coumadin clinic

## 2011-03-27 NOTE — Telephone Encounter (Signed)
New problem:  Per Sonia calling from [facility]. Patient need to be bridge on Lovenox prior to biposy - schedule on 12/6.  Last office note will faxed over.

## 2011-03-28 NOTE — Telephone Encounter (Signed)
Pt has appt with Coumadin clinic on 11/23.  Will set up Lovenox bridge at that time.

## 2011-04-05 ENCOUNTER — Encounter: Payer: Medicare Other | Admitting: *Deleted

## 2011-04-09 ENCOUNTER — Ambulatory Visit (INDEPENDENT_AMBULATORY_CARE_PROVIDER_SITE_OTHER): Payer: Medicare Other | Admitting: *Deleted

## 2011-04-09 DIAGNOSIS — I059 Rheumatic mitral valve disease, unspecified: Secondary | ICD-10-CM

## 2011-04-09 LAB — POCT INR: INR: 3.5

## 2011-04-09 MED ORDER — ENOXAPARIN SODIUM 80 MG/0.8ML ~~LOC~~ SOLN: 80.0000 mg | Freq: Two times a day (BID) | SUBCUTANEOUS | Status: DC

## 2011-04-09 NOTE — Patient Instructions (Addendum)
Last dose of Coumadin on 04/12/11. 04/13/11 No Coumadin, No Lovenox 04/14/11 No Coumadin, Lovenox 70mg  in then am, 70mg  in the pm 04/15/11 No Coumadin, Lovenox 70mg  in then am, 70mg  in the pm 04/16/11 No Coumadin, Lovenox 70mg  in then am, 70mg  in the pm 04/17/11 No Coumadin, Lovenox 70mg  in then am, nothing in the pm prior to procedure on 04/18/11. Resume Coumadin and Lovenox post procedure per MD instruction. Once resumes take an extra 1/2 tablet x 2 doses then resume normal dosage.

## 2011-04-11 ENCOUNTER — Telehealth: Payer: Self-pay | Admitting: Cardiovascular Disease

## 2011-04-11 ENCOUNTER — Telehealth: Payer: Self-pay | Admitting: *Deleted

## 2011-04-11 NOTE — Telephone Encounter (Signed)
New problem:  Bioposy  On 04/18/11. pls advise on coumadin.

## 2011-04-11 NOTE — Telephone Encounter (Signed)
Please see note/ telephone call from 03/27/11.

## 2011-04-11 NOTE — Telephone Encounter (Signed)
[facility] to let [facility] know he is being bridged.

## 2011-04-11 NOTE — Telephone Encounter (Signed)
Pt called about biopsy, forwarded to cc and sally putt but noted he was bridged in clinic and is at bottom of printout, called msg to call back if needs clarification.

## 2011-04-18 NOTE — Telephone Encounter (Signed)
Spoke with pt.  Had biopsy this morning.  Stated he took a Lovenox injection this AM prior to procedure per surgeon's instructions.  Unsure if this was correct or not.  MD stated he could start Coumadin and Lovenox tomorrow.  He will start Lovenox BID in AM and Coumadin tomorrow night.  He has appt to recheck INR on 12/11.

## 2011-04-18 NOTE — Telephone Encounter (Signed)
Sent to coumadin clinic, i see his inr last visit and bridging is at bottom of sheet but is unclear. Please advise pt, thank you.

## 2011-04-18 NOTE — Telephone Encounter (Signed)
New Msg: Pt calling wanting to know how long he should stay on lovenox after he starts taking his coumadin. Please return pt call to discuss further.

## 2011-04-23 ENCOUNTER — Ambulatory Visit (INDEPENDENT_AMBULATORY_CARE_PROVIDER_SITE_OTHER): Payer: Medicare Other | Admitting: *Deleted

## 2011-04-23 DIAGNOSIS — I059 Rheumatic mitral valve disease, unspecified: Secondary | ICD-10-CM

## 2011-04-23 LAB — POCT INR: INR: 1.7

## 2011-05-03 ENCOUNTER — Ambulatory Visit (INDEPENDENT_AMBULATORY_CARE_PROVIDER_SITE_OTHER): Payer: Medicare Other | Admitting: *Deleted

## 2011-05-03 DIAGNOSIS — I059 Rheumatic mitral valve disease, unspecified: Secondary | ICD-10-CM

## 2011-05-24 ENCOUNTER — Ambulatory Visit (INDEPENDENT_AMBULATORY_CARE_PROVIDER_SITE_OTHER): Payer: Medicare Other | Admitting: *Deleted

## 2011-05-24 DIAGNOSIS — I059 Rheumatic mitral valve disease, unspecified: Secondary | ICD-10-CM

## 2011-05-24 LAB — POCT INR: INR: 3.5

## 2011-06-03 ENCOUNTER — Other Ambulatory Visit: Payer: Self-pay | Admitting: Pharmacist

## 2011-06-03 MED ORDER — WARFARIN SODIUM 5 MG PO TABS: ORAL_TABLET | ORAL | Status: DC

## 2011-06-21 ENCOUNTER — Ambulatory Visit (INDEPENDENT_AMBULATORY_CARE_PROVIDER_SITE_OTHER): Payer: Medicare Other | Admitting: *Deleted

## 2011-06-21 DIAGNOSIS — I059 Rheumatic mitral valve disease, unspecified: Secondary | ICD-10-CM

## 2011-06-21 LAB — POCT INR: INR: 2.8

## 2011-07-19 ENCOUNTER — Ambulatory Visit (INDEPENDENT_AMBULATORY_CARE_PROVIDER_SITE_OTHER): Payer: Medicare Other

## 2011-07-19 DIAGNOSIS — I059 Rheumatic mitral valve disease, unspecified: Secondary | ICD-10-CM

## 2011-08-16 ENCOUNTER — Ambulatory Visit (INDEPENDENT_AMBULATORY_CARE_PROVIDER_SITE_OTHER): Payer: Medicare Other | Admitting: Pharmacist

## 2011-08-16 DIAGNOSIS — Z7901 Long term (current) use of anticoagulants: Secondary | ICD-10-CM

## 2011-08-16 DIAGNOSIS — I059 Rheumatic mitral valve disease, unspecified: Secondary | ICD-10-CM

## 2011-08-16 LAB — POCT INR: INR: 2.9

## 2011-09-27 ENCOUNTER — Ambulatory Visit (INDEPENDENT_AMBULATORY_CARE_PROVIDER_SITE_OTHER): Payer: Medicare Other

## 2011-09-27 DIAGNOSIS — Z7901 Long term (current) use of anticoagulants: Secondary | ICD-10-CM

## 2011-09-27 DIAGNOSIS — I059 Rheumatic mitral valve disease, unspecified: Secondary | ICD-10-CM

## 2011-11-08 ENCOUNTER — Ambulatory Visit (INDEPENDENT_AMBULATORY_CARE_PROVIDER_SITE_OTHER): Payer: Medicare Other | Admitting: *Deleted

## 2011-11-08 DIAGNOSIS — Z7901 Long term (current) use of anticoagulants: Secondary | ICD-10-CM

## 2011-11-08 DIAGNOSIS — I059 Rheumatic mitral valve disease, unspecified: Secondary | ICD-10-CM

## 2011-11-11 ENCOUNTER — Other Ambulatory Visit: Payer: Self-pay | Admitting: *Deleted

## 2011-11-11 MED ORDER — WARFARIN SODIUM 5 MG PO TABS: ORAL_TABLET | ORAL | Status: DC

## 2011-12-20 ENCOUNTER — Ambulatory Visit (INDEPENDENT_AMBULATORY_CARE_PROVIDER_SITE_OTHER): Payer: Medicare Other

## 2011-12-20 DIAGNOSIS — Z7901 Long term (current) use of anticoagulants: Secondary | ICD-10-CM

## 2011-12-20 DIAGNOSIS — I059 Rheumatic mitral valve disease, unspecified: Secondary | ICD-10-CM

## 2011-12-20 LAB — POCT INR: INR: 2.6

## 2012-01-24 ENCOUNTER — Encounter: Payer: Self-pay | Admitting: Cardiovascular Disease

## 2012-01-28 ENCOUNTER — Ambulatory Visit (INDEPENDENT_AMBULATORY_CARE_PROVIDER_SITE_OTHER): Payer: Medicare Other | Admitting: Cardiovascular Disease

## 2012-01-28 ENCOUNTER — Encounter: Payer: Self-pay | Admitting: Cardiovascular Disease

## 2012-01-28 VITALS — BP 118/76 | HR 84 | Ht 72.0 in | Wt 160.1 lb

## 2012-01-28 DIAGNOSIS — Z954 Presence of other heart-valve replacement: Secondary | ICD-10-CM

## 2012-01-28 DIAGNOSIS — Z952 Presence of prosthetic heart valve: Secondary | ICD-10-CM

## 2012-01-28 DIAGNOSIS — I059 Rheumatic mitral valve disease, unspecified: Secondary | ICD-10-CM

## 2012-01-28 NOTE — Assessment & Plan Note (Signed)
Terry [name] is doing very well. His valve sounds are nice and crisp. His INR  levels have been therapeutic.  I'll see him again in one year. He will call me if he has any problems.

## 2012-01-28 NOTE — Progress Notes (Signed)
Terry [name] Date of Birth  1935-07-13 [facility] [address]     [address] [place], [place]  [address] [phone]  Fax  [phone]   Problem list: 1. Mitral valve replacement 2. Hyperlipidemia  History of Present Illness:  Terry [name] is a day 76 year old gentleman with a history of mitral valve replacement. He has done very well. His INR Levels have been therapeutic. He has not had any problems.  He is doing very well.  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   Current Outpatient Prescriptions on File Prior to Visit  Medication Sig Dispense Refill  . ACTONEL 150 MG tablet Take 1 tablet by mouth every 30 (thirty) days.      . Calcium Carbonate-Vitamin D (CALCIUM + D PO) Take 600 mg by mouth daily.      . Cholecalciferol (VITAMIN D3) 1000 UNITS tablet Take 1,000 Units by mouth daily.        . CRESTOR 20 MG tablet Take 1 tablet by mouth Daily.      Marland Kitchen enoxaparin (LOVENOX) 80 MG/0.8ML SOLN injection Inject 0.8 mLs (80 mg total) into the skin every 12 (twelve) hours.  12 Syringe  1  . metoprolol (TOPROL-XL) 100 MG 24 hr tablet TAKE 1 1/2 TABLET BY MOUTH DAILY  140 tablet  11  . multivitamin (THERAGRAN) per tablet Take 1 tablet by mouth daily.        . niacin 500 MG tablet Take 500 mg by mouth 2 (two) times daily with a meal.        . omeprazole (PRILOSEC OTC) 20 MG tablet Take 20 mg by mouth every other day.        . propranolol (INDERAL) 10 MG tablet Take 10 mg by mouth as needed.        . warfarin (COUMADIN) 5 MG tablet Take as directed by Anticoagulation clinic.  90 tablet  1    Allergies  Allergen Reactions  . Sulfa Drugs Cross Reactors     Past Medical History  Diagnosis Date  . Mitral valve prolapse   . Dyslipidemia   . Long-term (current) use of anticoagulants   . Hyperlipidemia   . Ventricular tachycardia   . Osteoporosis   . Benign prostatic hypertrophy     Past Surgical History    Procedure Date  . Cardiac catheterization 08/19/2001    Essentially smooth and normal coronary arteries  . Coronary stent placement 11/03/2001    Mitral valve replacement with #35 St. Jude mechanical mitral valve prosthesis and closure of  left atrial appendage  . Appendico-vesicostomy cutaneous     History  Smoking status  . Former Smoker  . Quit date: 05/13/1964  Smokeless tobacco  . Not on file    History  Alcohol Use     No family history on file.  Reviw of Systems:  Reviewed in the HPI.  All other systems are negative.  Physical Exam: BP 118/76  Pulse 84  Ht 6' (1.829 m)  Wt 160 lb 1.9 oz (72.63 kg)  BMI 21.72 kg/m2 The patient is alert and oriented x 3.  The mood and affect are normal.   Skin: warm and dry.  Color is normal.    HEENT:   the sclera are nonicteric.  The mucous membranes are moist.  The carotids are 2+ without bruits.  There is no thyromegaly.  There is no JVD.    Lungs: clear.  The chest wall is non tender.    Heart: regular rate with a mechanical S1 and S2.  There are no murmurs, gallops, or rubs. The PMI is not displaced.     Abdomin: good bowel sounds.  There is no guarding or rebound.  There is no hepatosplenomegaly or tenderness.  There are no masses.   Extremities:  no clubbing, cyanosis, or edema.  The legs are without rashes.  The distal pulses are intact.   Neuro:  Cranial nerves II - XII are intact.  Motor and sensory functions are intact.    The gait is normal.  EKG: 01/28/2012-normal sinus rhythm at 84 beats a minute. He is minimal voltage criteria for left ventricular hypertrophy. He has nonspecific ST and T wave changes. Assessment / Plan:

## 2012-01-28 NOTE — Patient Instructions (Addendum)
Your physician wants you to follow-up in: 1 year. You will receive a reminder letter in the mail two months in advance. If you don't receive a letter, please call our office to schedule the follow-up appointment.  

## 2012-01-31 ENCOUNTER — Encounter: Payer: Self-pay | Admitting: Cardiovascular Disease

## 2012-01-31 ENCOUNTER — Ambulatory Visit (INDEPENDENT_AMBULATORY_CARE_PROVIDER_SITE_OTHER): Payer: Medicare Other | Admitting: Pharmacist

## 2012-01-31 DIAGNOSIS — Z7901 Long term (current) use of anticoagulants: Secondary | ICD-10-CM

## 2012-01-31 DIAGNOSIS — I059 Rheumatic mitral valve disease, unspecified: Secondary | ICD-10-CM

## 2012-02-03 ENCOUNTER — Other Ambulatory Visit: Payer: Self-pay | Admitting: *Deleted

## 2012-02-03 MED ORDER — METOPROLOL SUCCINATE ER 100 MG PO TB24: ORAL_TABLET | ORAL | Status: DC

## 2012-02-03 NOTE — Telephone Encounter (Signed)
Fax Received. Refill Completed. Terry [name] (R.M.A)   

## 2012-02-04 ENCOUNTER — Other Ambulatory Visit: Payer: Self-pay | Admitting: Cardiovascular Disease

## 2012-02-04 NOTE — Telephone Encounter (Signed)
Fax Received. Refill Completed. Korynn Kenedy Chowoe (R.M.A)   

## 2012-03-13 ENCOUNTER — Ambulatory Visit (INDEPENDENT_AMBULATORY_CARE_PROVIDER_SITE_OTHER): Payer: Medicare Other

## 2012-03-13 DIAGNOSIS — Z7901 Long term (current) use of anticoagulants: Secondary | ICD-10-CM

## 2012-03-13 DIAGNOSIS — I059 Rheumatic mitral valve disease, unspecified: Secondary | ICD-10-CM

## 2012-04-24 ENCOUNTER — Ambulatory Visit (INDEPENDENT_AMBULATORY_CARE_PROVIDER_SITE_OTHER): Payer: Medicare Other

## 2012-04-24 DIAGNOSIS — I059 Rheumatic mitral valve disease, unspecified: Secondary | ICD-10-CM

## 2012-04-24 DIAGNOSIS — Z7901 Long term (current) use of anticoagulants: Secondary | ICD-10-CM

## 2012-04-24 LAB — POCT INR: INR: 3.9

## 2012-05-22 ENCOUNTER — Ambulatory Visit (INDEPENDENT_AMBULATORY_CARE_PROVIDER_SITE_OTHER): Payer: Medicare Other | Admitting: *Deleted

## 2012-05-22 DIAGNOSIS — I059 Rheumatic mitral valve disease, unspecified: Secondary | ICD-10-CM

## 2012-05-22 DIAGNOSIS — Z7901 Long term (current) use of anticoagulants: Secondary | ICD-10-CM

## 2012-05-22 LAB — POCT INR: INR: 3.8

## 2012-06-05 ENCOUNTER — Ambulatory Visit (INDEPENDENT_AMBULATORY_CARE_PROVIDER_SITE_OTHER): Payer: Medicare Other

## 2012-06-05 DIAGNOSIS — I059 Rheumatic mitral valve disease, unspecified: Secondary | ICD-10-CM

## 2012-06-05 DIAGNOSIS — Z7901 Long term (current) use of anticoagulants: Secondary | ICD-10-CM

## 2012-06-27 ENCOUNTER — Other Ambulatory Visit: Payer: Self-pay

## 2012-07-03 ENCOUNTER — Ambulatory Visit (INDEPENDENT_AMBULATORY_CARE_PROVIDER_SITE_OTHER): Payer: Medicare Other

## 2012-07-03 DIAGNOSIS — Z7901 Long term (current) use of anticoagulants: Secondary | ICD-10-CM

## 2012-07-03 DIAGNOSIS — I059 Rheumatic mitral valve disease, unspecified: Secondary | ICD-10-CM

## 2012-07-03 LAB — POCT INR: INR: 2.7

## 2012-07-31 ENCOUNTER — Ambulatory Visit (INDEPENDENT_AMBULATORY_CARE_PROVIDER_SITE_OTHER): Payer: Medicare Other

## 2012-07-31 DIAGNOSIS — Z7901 Long term (current) use of anticoagulants: Secondary | ICD-10-CM

## 2012-07-31 DIAGNOSIS — I059 Rheumatic mitral valve disease, unspecified: Secondary | ICD-10-CM

## 2012-08-03 ENCOUNTER — Other Ambulatory Visit: Payer: Self-pay | Admitting: Pharmacist

## 2012-08-03 MED ORDER — WARFARIN SODIUM 5 MG PO TABS: ORAL_TABLET | ORAL | Status: DC

## 2012-09-11 ENCOUNTER — Ambulatory Visit (INDEPENDENT_AMBULATORY_CARE_PROVIDER_SITE_OTHER): Payer: Medicare Other | Admitting: *Deleted

## 2012-09-11 DIAGNOSIS — I059 Rheumatic mitral valve disease, unspecified: Secondary | ICD-10-CM

## 2012-09-11 DIAGNOSIS — Z7901 Long term (current) use of anticoagulants: Secondary | ICD-10-CM

## 2012-09-11 LAB — POCT INR: INR: 2.7

## 2012-10-23 ENCOUNTER — Ambulatory Visit (INDEPENDENT_AMBULATORY_CARE_PROVIDER_SITE_OTHER): Payer: Medicare Other

## 2012-10-23 DIAGNOSIS — Z7901 Long term (current) use of anticoagulants: Secondary | ICD-10-CM

## 2012-10-23 DIAGNOSIS — I059 Rheumatic mitral valve disease, unspecified: Secondary | ICD-10-CM

## 2012-10-23 LAB — POCT INR: INR: 2.9

## 2012-12-04 ENCOUNTER — Ambulatory Visit (INDEPENDENT_AMBULATORY_CARE_PROVIDER_SITE_OTHER): Payer: Medicare Other | Admitting: *Deleted

## 2012-12-04 DIAGNOSIS — I059 Rheumatic mitral valve disease, unspecified: Secondary | ICD-10-CM

## 2012-12-04 DIAGNOSIS — Z7901 Long term (current) use of anticoagulants: Secondary | ICD-10-CM

## 2012-12-16 ENCOUNTER — Other Ambulatory Visit: Payer: Self-pay

## 2012-12-17 ENCOUNTER — Encounter: Payer: Self-pay | Admitting: Cardiovascular Disease

## 2013-01-15 ENCOUNTER — Ambulatory Visit (INDEPENDENT_AMBULATORY_CARE_PROVIDER_SITE_OTHER): Payer: Medicare Other

## 2013-01-15 DIAGNOSIS — I059 Rheumatic mitral valve disease, unspecified: Secondary | ICD-10-CM

## 2013-01-15 DIAGNOSIS — Z7901 Long term (current) use of anticoagulants: Secondary | ICD-10-CM

## 2013-01-18 ENCOUNTER — Other Ambulatory Visit: Payer: Self-pay | Admitting: *Deleted

## 2013-01-18 MED ORDER — WARFARIN SODIUM 5 MG PO TABS: ORAL_TABLET | ORAL | Status: DC

## 2013-01-26 ENCOUNTER — Ambulatory Visit (INDEPENDENT_AMBULATORY_CARE_PROVIDER_SITE_OTHER): Payer: Medicare Other | Admitting: Pharmacist

## 2013-01-26 DIAGNOSIS — I059 Rheumatic mitral valve disease, unspecified: Secondary | ICD-10-CM

## 2013-01-26 DIAGNOSIS — Z7901 Long term (current) use of anticoagulants: Secondary | ICD-10-CM

## 2013-02-08 ENCOUNTER — Other Ambulatory Visit: Payer: Self-pay | Admitting: *Deleted

## 2013-02-08 MED ORDER — METOPROLOL SUCCINATE ER 100 MG PO TB24: ORAL_TABLET | ORAL | Status: DC

## 2013-02-24 ENCOUNTER — Encounter: Payer: Self-pay | Admitting: Cardiovascular Disease

## 2013-02-26 ENCOUNTER — Ambulatory Visit (INDEPENDENT_AMBULATORY_CARE_PROVIDER_SITE_OTHER): Payer: Medicare Other | Admitting: Pharmacist

## 2013-02-26 DIAGNOSIS — I059 Rheumatic mitral valve disease, unspecified: Secondary | ICD-10-CM

## 2013-02-26 DIAGNOSIS — Z7901 Long term (current) use of anticoagulants: Secondary | ICD-10-CM

## 2013-03-05 ENCOUNTER — Encounter: Payer: Self-pay | Admitting: Cardiovascular Disease

## 2013-03-05 ENCOUNTER — Ambulatory Visit (INDEPENDENT_AMBULATORY_CARE_PROVIDER_SITE_OTHER): Payer: Medicare Other | Admitting: Cardiovascular Disease

## 2013-03-05 VITALS — BP 110/68 | HR 80 | Ht 72.0 in | Wt 161.0 lb

## 2013-03-05 DIAGNOSIS — Z954 Presence of other heart-valve replacement: Secondary | ICD-10-CM

## 2013-03-05 DIAGNOSIS — Z952 Presence of prosthetic heart valve: Secondary | ICD-10-CM

## 2013-03-05 NOTE — Assessment & Plan Note (Signed)
Terry [name] is doing well. His valve sounds crisp. Her levels have been therapeutic. It was a little high today. We'll continue with his same medications. I'll see him again in one year for followup office visit and EKG.

## 2013-03-05 NOTE — Progress Notes (Signed)
Terry [name]  Date of Birth  March 25, 1936 [facility] [address]     [address] [place], [place]  [address] [phone]  Fax  [phone]   Problem list: 1. Mitral valve replacement 2. Hyperlipidemia  History of Present Illness:  Terry [name] is a day 77 year old gentleman with a history of mitral valve replacement. He has done very well. His INR Levels have been therapeutic. He has not had any problems.  He is doing very well.  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   Oct. 24, 2014:  Terry [name] has done well.  Still volunteering at the hospital.   No CP , no dyspnea.   INR levels are ok.     Current Outpatient Prescriptions on File Prior to Visit  Medication Sig Dispense Refill  . ACTONEL 150 MG tablet Take 1 tablet by mouth every 30 (thirty) days.      . Calcium Carbonate-Vitamin D (CALCIUM + D PO) Take 600 mg by mouth daily.      . Cholecalciferol (VITAMIN D3) 1000 UNITS tablet Take 1,000 Units by mouth daily.        . CRESTOR 20 MG tablet Take 1 tablet by mouth Daily.      . metoprolol succinate (TOPROL-XL) 100 MG 24 hr tablet TAKE 1 AND 1/2 TABLET BY MOUTH DAILY  135 tablet  0  . multivitamin (THERAGRAN) per tablet Take 1 tablet by mouth daily.        . niacin 500 MG tablet Take 500 mg by mouth 2 (two) times daily with a meal.        . omeprazole (PRILOSEC OTC) 20 MG tablet Take 20 mg by mouth every other day.        . propranolol (INDERAL) 10 MG tablet Take 10 mg by mouth as needed.        . warfarin (COUMADIN) 5 MG tablet Take as directed by Anticoagulation clinic.  100 tablet  1   No current facility-administered medications on file prior to visit.    Allergies  Allergen Reactions  . Sulfa Drugs Cross Reactors     Past Medical History  Diagnosis Date  . Mitral valve prolapse   . Dyslipidemia   . Long-term (current) use of anticoagulants   . Hyperlipidemia   . Ventricular tachycardia     . Osteoporosis   . Benign prostatic hypertrophy     Past Surgical History  Procedure Laterality Date  . Cardiac catheterization  08/19/2001    Essentially smooth and normal coronary arteries  . Coronary stent placement  11/03/2001    Mitral valve replacement with #35 St. Jude mechanical mitral valve prosthesis and closure of  left atrial appendage  . Appendico-vesicostomy cutaneous      History  Smoking status  . Former Smoker  . Quit date: 05/13/1964  Smokeless tobacco  . Not on file    History  Alcohol Use     No family history on file.  Reviw of Systems:  Reviewed in the HPI.  All other systems are negative.  Physical Exam: BP 110/68  Pulse 80  Ht 6' (1.829 m)  Wt 161 lb (73.029 kg)  BMI 21.83 kg/m2 The patient is alert and oriented x 3.  The mood and affect are normal.   Skin: warm and dry.  Color is normal.    HEENT:   the sclera are nonicteric.  The mucous membranes are moist.  The carotids are 2+ without bruits.  There is no thyromegaly.  There is no JVD.    Lungs: clear.  The chest wall is non tender.    Heart: regular rate with a mechanical S1 and S2.  There are no murmurs, gallops, or rubs. The PMI is not displaced.     Abdomin: good bowel sounds.  There is no guarding or rebound.  There is no hepatosplenomegaly or tenderness.  There are no masses.   Extremities:  no clubbing, cyanosis, or edema.  The legs are without rashes.  The distal pulses are intact.   Neuro:  Cranial nerves II - XII are intact.  Motor and sensory functions are intact.    The gait is normal.  EKG: Oct. 24, 2014;  -normal sinus rhythm at 80 beats a minute. He is minimal voltage criteria for left ventricular hypertrophy. He has nonspecific ST and T wave changes.  Assessment / Plan:

## 2013-03-05 NOTE — Patient Instructions (Signed)
Your physician wants you to follow-up in: 1 year  You will receive a reminder letter in the mail two months in advance. If you don't receive a letter, please call our office to schedule the follow-up appointment.  Your physician recommends that you continue on your current medications as directed. Please refer to the Current Medication list given to you today.  

## 2013-03-18 ENCOUNTER — Other Ambulatory Visit: Payer: Self-pay

## 2013-03-19 ENCOUNTER — Ambulatory Visit (INDEPENDENT_AMBULATORY_CARE_PROVIDER_SITE_OTHER): Payer: Medicare Other | Admitting: Pharmacist

## 2013-03-19 DIAGNOSIS — Z7901 Long term (current) use of anticoagulants: Secondary | ICD-10-CM

## 2013-03-19 DIAGNOSIS — I059 Rheumatic mitral valve disease, unspecified: Secondary | ICD-10-CM

## 2013-03-19 LAB — POCT INR: INR: 3

## 2013-03-25 ENCOUNTER — Encounter: Payer: Self-pay | Admitting: Cardiovascular Disease

## 2013-04-20 ENCOUNTER — Ambulatory Visit (INDEPENDENT_AMBULATORY_CARE_PROVIDER_SITE_OTHER): Payer: Medicare Other | Admitting: General Practice

## 2013-04-20 DIAGNOSIS — Z7901 Long term (current) use of anticoagulants: Secondary | ICD-10-CM

## 2013-04-20 DIAGNOSIS — I059 Rheumatic mitral valve disease, unspecified: Secondary | ICD-10-CM

## 2013-05-17 ENCOUNTER — Other Ambulatory Visit: Payer: Self-pay

## 2013-05-17 MED ORDER — METOPROLOL SUCCINATE ER 100 MG PO TB24: ORAL_TABLET | ORAL | Status: DC

## 2013-05-25 ENCOUNTER — Ambulatory Visit (INDEPENDENT_AMBULATORY_CARE_PROVIDER_SITE_OTHER): Payer: Medicare Other | Admitting: Pharmacist

## 2013-05-25 DIAGNOSIS — Z7901 Long term (current) use of anticoagulants: Secondary | ICD-10-CM

## 2013-05-25 DIAGNOSIS — I059 Rheumatic mitral valve disease, unspecified: Secondary | ICD-10-CM

## 2013-05-25 LAB — POCT INR: INR: 4.2

## 2013-06-15 ENCOUNTER — Ambulatory Visit (INDEPENDENT_AMBULATORY_CARE_PROVIDER_SITE_OTHER): Payer: Medicare Other

## 2013-06-15 DIAGNOSIS — Z7901 Long term (current) use of anticoagulants: Secondary | ICD-10-CM

## 2013-06-15 DIAGNOSIS — I059 Rheumatic mitral valve disease, unspecified: Secondary | ICD-10-CM

## 2013-06-15 LAB — POCT INR: INR: 3.5

## 2013-07-13 ENCOUNTER — Ambulatory Visit (INDEPENDENT_AMBULATORY_CARE_PROVIDER_SITE_OTHER): Payer: Medicare Other | Admitting: Pharmacist

## 2013-07-13 DIAGNOSIS — I059 Rheumatic mitral valve disease, unspecified: Secondary | ICD-10-CM

## 2013-07-13 DIAGNOSIS — Z7901 Long term (current) use of anticoagulants: Secondary | ICD-10-CM

## 2013-07-13 LAB — POCT INR: INR: 2.6

## 2013-08-10 ENCOUNTER — Ambulatory Visit (INDEPENDENT_AMBULATORY_CARE_PROVIDER_SITE_OTHER): Payer: Medicare Other

## 2013-08-10 DIAGNOSIS — Z7901 Long term (current) use of anticoagulants: Secondary | ICD-10-CM

## 2013-08-10 DIAGNOSIS — I059 Rheumatic mitral valve disease, unspecified: Secondary | ICD-10-CM

## 2013-08-10 LAB — POCT INR: INR: 2.6

## 2013-09-14 ENCOUNTER — Ambulatory Visit (INDEPENDENT_AMBULATORY_CARE_PROVIDER_SITE_OTHER): Payer: Medicare Other | Admitting: Pharmacist Clinician (PhC)/ Clinical Pharmacy Specialist

## 2013-09-14 DIAGNOSIS — Z7901 Long term (current) use of anticoagulants: Secondary | ICD-10-CM

## 2013-09-14 DIAGNOSIS — I059 Rheumatic mitral valve disease, unspecified: Secondary | ICD-10-CM

## 2013-09-14 LAB — POCT INR: INR: 2.8

## 2013-10-26 ENCOUNTER — Ambulatory Visit (INDEPENDENT_AMBULATORY_CARE_PROVIDER_SITE_OTHER): Payer: Medicare Other | Admitting: Pharmacist Clinician (PhC)/ Clinical Pharmacy Specialist

## 2013-10-26 DIAGNOSIS — I059 Rheumatic mitral valve disease, unspecified: Secondary | ICD-10-CM

## 2013-10-26 DIAGNOSIS — Z7901 Long term (current) use of anticoagulants: Secondary | ICD-10-CM

## 2013-10-26 LAB — POCT INR: INR: 2.5

## 2013-11-19 ENCOUNTER — Encounter: Payer: Self-pay | Admitting: Cardiovascular Disease

## 2013-11-19 ENCOUNTER — Ambulatory Visit (INDEPENDENT_AMBULATORY_CARE_PROVIDER_SITE_OTHER): Payer: Medicare Other | Admitting: Cardiovascular Disease

## 2013-11-19 VITALS — BP 116/70 | HR 70 | Ht 72.0 in | Wt 161.6 lb

## 2013-11-19 DIAGNOSIS — R002 Palpitations: Secondary | ICD-10-CM

## 2013-11-19 DIAGNOSIS — Z954 Presence of other heart-valve replacement: Secondary | ICD-10-CM

## 2013-11-19 DIAGNOSIS — Z952 Presence of prosthetic heart valve: Secondary | ICD-10-CM

## 2013-11-19 LAB — BASIC METABOLIC PANEL
BUN: 18 mg/dL (ref 6–23)
CHLORIDE: 99 meq/L (ref 96–112)
CO2: 30 meq/L (ref 19–32)
Calcium: 9.4 mg/dL (ref 8.4–10.5)
Creatinine, Ser: 0.8 mg/dL (ref 0.4–1.5)
GFR: 102.74 mL/min (ref >60.00)
GLUCOSE: 94 mg/dL (ref 70–99)
POTASSIUM: 4.1 meq/L (ref 3.5–5.1)
Sodium: 136 mEq/L (ref 135–145)

## 2013-11-19 LAB — TSH: TSH: 0.57 u[IU]/mL (ref 0.35–4.50)

## 2013-11-19 NOTE — Assessment & Plan Note (Signed)
Rush Landmark is doing very well. His INR levels have been therapeutic. Continue with same medications.

## 2013-11-19 NOTE — Patient Instructions (Signed)
Your physician recommends that you have lab work:  TODAY  Your physician recommends that you continue on your current medications as directed. Please refer to the Current Medication list given to you today.  Your physician wants you to follow-up in: 1 year with Dr. Acie Fredrickson.  You will receive a reminder letter in the mail two months in advance. If you don't receive a letter, please call our office to schedule the follow-up appointment.

## 2013-11-19 NOTE — Progress Notes (Signed)
Terry [name]  Date of Birth  02-07-1936 [facility] / [facility] [address]     [place], [place]  [address] [phone]  Fax  [phone]   Problem list: 1. Mitral valve replacement- mitral valve repair while in [place] and had mitral valve replacement here with Dr. Servando Snare 2003 2. Hyperlipidemia  History of Present Illness:  Terry [name] is a day 78 year old gentleman with a history of mitral valve replacement. He has done very well. His INR Levels have been therapeutic. He has not had any problems.  He is doing very well.  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   Oct. 24, 2014:  Terry [name] has done well.  Still volunteering at the hospital.   No CP , no dyspnea.   INR levels are ok.    November 19, 2013:  Terry [name] is doing ok but has been having more palpitations associated with a very brief episode of lightheadedness. He's not had any episodes of syncope. These episodes seem to be more common in the morning and can occur when he is walking down the hall. He also has an occasionally at night when he is relaxing.  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 He will walks for exercise everyday.   Current Outpatient Prescriptions on File Prior to Visit  Medication Sig Dispense Refill  . Calcium Carbonate-Vitamin D (CALCIUM + D PO) Take 600 mg by mouth daily.      . Cholecalciferol (VITAMIN D3) 1000 UNITS tablet Take 1,000 Units by mouth daily.        . CRESTOR 20 MG tablet Take 1 tablet by mouth Daily.      . metoprolol succinate (TOPROL-XL) 100 MG 24 hr tablet TAKE 1 AND 1/2 TABLET BY MOUTH DAILY  135 tablet  6  . multivitamin (THERAGRAN) per tablet Take 1 tablet by mouth daily.        . niacin 500 MG tablet Take 500 mg by mouth 2 (two) times daily with a meal.        . omeprazole (PRILOSEC OTC) 20 MG tablet Take 20 mg by mouth every other day.        .  propranolol (INDERAL) 10 MG tablet Take 10 mg by mouth as needed.        . warfarin (COUMADIN) 5 MG tablet Take as directed by Anticoagulation clinic.  100 tablet  1   No current facility-administered medications on file prior to visit.    Allergies  Allergen Reactions  . Sulfa Drugs Cross Reactors     Past Medical History  Diagnosis Date  . Mitral valve prolapse   . Dyslipidemia   . Long-term (current) use of anticoagulants   . Hyperlipidemia   . Ventricular tachycardia   . Osteoporosis   . Benign prostatic hypertrophy     Past Surgical History  Procedure Laterality Date  . Cardiac catheterization  08/19/2001    Essentially smooth and normal coronary arteries  . Coronary stent placement  11/03/2001    Mitral valve replacement with #35 St. Jude mechanical mitral valve prosthesis and closure of  left atrial appendage  . Appendico-vesicostomy cutaneous      History  Smoking status  . Former Smoker  . Quit date: 05/13/1964  Smokeless tobacco  . Not on file    History  Alcohol Use     No family history on file.  Reviw of Systems:  Reviewed in the HPI.  All other systems are negative.  Physical Exam: BP 116/70  Pulse 70  Ht 6' (1.829 m)  Wt 161 lb 9.6 oz (73.301 kg)  BMI 21.91 kg/m2 The patient is alert and oriented x 3.  The mood and affect are normal.   Skin: warm and dry.  Color is normal.    HEENT:   the sclera are nonicteric.  The mucous membranes are moist.  The carotids are 2+ without bruits.  There is no thyromegaly.  There is no JVD.    Lungs: clear.  The chest wall is non tender.    Heart: regular rate with a mechanical S1 and S2.  There are no murmurs, gallops, or rubs. The PMI is not displaced.     Abdomin: good bowel sounds.  There is no guarding or rebound.  There is no hepatosplenomegaly or tenderness.  There are no masses.   Extremities:  no clubbing, cyanosis, or edema.  The legs are without rashes.  The distal pulses are intact.   Neuro:   Cranial nerves II - XII are intact.  Motor and sensory functions are intact.    The gait is normal.  EKG:  11/19/2013: Normal sinus rhythm at 70. EKG is normal.   Assessment / Plan:

## 2013-12-07 ENCOUNTER — Ambulatory Visit (INDEPENDENT_AMBULATORY_CARE_PROVIDER_SITE_OTHER): Payer: Medicare Other | Admitting: *Deleted

## 2013-12-07 DIAGNOSIS — I059 Rheumatic mitral valve disease, unspecified: Secondary | ICD-10-CM

## 2013-12-07 DIAGNOSIS — Z7901 Long term (current) use of anticoagulants: Secondary | ICD-10-CM

## 2013-12-07 LAB — POCT INR: INR: 3

## 2013-12-09 ENCOUNTER — Telehealth: Payer: Self-pay | Admitting: *Deleted

## 2013-12-09 MED ORDER — WARFARIN SODIUM 5 MG PO TABS: ORAL_TABLET | ORAL | Status: DC

## 2013-12-09 NOTE — Telephone Encounter (Signed)
Rx sent 

## 2013-12-09 NOTE — Telephone Encounter (Signed)
Patient requests coumadin refill [facility] [place]. Thanks, MI

## 2014-01-18 ENCOUNTER — Ambulatory Visit (INDEPENDENT_AMBULATORY_CARE_PROVIDER_SITE_OTHER): Payer: Medicare Other | Admitting: Pharmacist

## 2014-01-18 DIAGNOSIS — Z7901 Long term (current) use of anticoagulants: Secondary | ICD-10-CM

## 2014-01-18 DIAGNOSIS — I059 Rheumatic mitral valve disease, unspecified: Secondary | ICD-10-CM

## 2014-01-18 LAB — POCT INR: INR: 2.5

## 2014-03-01 ENCOUNTER — Ambulatory Visit (INDEPENDENT_AMBULATORY_CARE_PROVIDER_SITE_OTHER): Payer: Medicare Other | Admitting: Pharmacist

## 2014-03-01 DIAGNOSIS — Z7901 Long term (current) use of anticoagulants: Secondary | ICD-10-CM

## 2014-03-01 DIAGNOSIS — I059 Rheumatic mitral valve disease, unspecified: Secondary | ICD-10-CM

## 2014-03-01 LAB — POCT INR: INR: 3.1

## 2014-03-07 ENCOUNTER — Encounter: Payer: Self-pay | Admitting: Cardiovascular Disease

## 2014-03-25 ENCOUNTER — Ambulatory Visit (INDEPENDENT_AMBULATORY_CARE_PROVIDER_SITE_OTHER): Payer: Medicare Other | Admitting: Pharmacist

## 2014-03-25 DIAGNOSIS — I059 Rheumatic mitral valve disease, unspecified: Secondary | ICD-10-CM

## 2014-03-25 DIAGNOSIS — Z7901 Long term (current) use of anticoagulants: Secondary | ICD-10-CM

## 2014-03-25 LAB — POCT INR: INR: 4.3

## 2014-04-12 ENCOUNTER — Ambulatory Visit (INDEPENDENT_AMBULATORY_CARE_PROVIDER_SITE_OTHER): Payer: Medicare Other | Admitting: Pharmacist

## 2014-04-12 DIAGNOSIS — Z7901 Long term (current) use of anticoagulants: Secondary | ICD-10-CM

## 2014-04-12 DIAGNOSIS — I059 Rheumatic mitral valve disease, unspecified: Secondary | ICD-10-CM

## 2014-04-12 LAB — POCT INR: INR: 3.4

## 2014-05-10 ENCOUNTER — Ambulatory Visit (INDEPENDENT_AMBULATORY_CARE_PROVIDER_SITE_OTHER): Payer: Medicare Other | Admitting: Pharmacist Clinician (PhC)/ Clinical Pharmacy Specialist

## 2014-05-10 DIAGNOSIS — I059 Rheumatic mitral valve disease, unspecified: Secondary | ICD-10-CM

## 2014-05-10 DIAGNOSIS — Z7901 Long term (current) use of anticoagulants: Secondary | ICD-10-CM

## 2014-05-10 LAB — POCT INR: INR: 3

## 2014-06-21 ENCOUNTER — Ambulatory Visit (INDEPENDENT_AMBULATORY_CARE_PROVIDER_SITE_OTHER): Payer: 59 | Admitting: *Deleted

## 2014-06-21 DIAGNOSIS — I059 Rheumatic mitral valve disease, unspecified: Secondary | ICD-10-CM

## 2014-06-21 DIAGNOSIS — Z7901 Long term (current) use of anticoagulants: Secondary | ICD-10-CM

## 2014-06-21 LAB — POCT INR: INR: 3.2

## 2014-06-27 ENCOUNTER — Other Ambulatory Visit: Payer: Self-pay | Admitting: Cardiovascular Disease

## 2014-08-02 ENCOUNTER — Other Ambulatory Visit: Payer: Self-pay | Admitting: *Deleted

## 2014-08-02 ENCOUNTER — Ambulatory Visit (INDEPENDENT_AMBULATORY_CARE_PROVIDER_SITE_OTHER): Payer: Medicare Other | Admitting: *Deleted

## 2014-08-02 DIAGNOSIS — Z7901 Long term (current) use of anticoagulants: Secondary | ICD-10-CM | POA: Diagnosis not present

## 2014-08-02 DIAGNOSIS — I059 Rheumatic mitral valve disease, unspecified: Secondary | ICD-10-CM

## 2014-08-02 LAB — POCT INR: INR: 3.8

## 2014-08-02 MED ORDER — METOPROLOL SUCCINATE ER 100 MG PO TB24: ORAL_TABLET | ORAL | Status: DC

## 2014-08-09 ENCOUNTER — Encounter: Payer: Self-pay | Admitting: Cardiovascular Disease

## 2014-08-16 ENCOUNTER — Ambulatory Visit (INDEPENDENT_AMBULATORY_CARE_PROVIDER_SITE_OTHER): Payer: Medicare Other | Admitting: *Deleted

## 2014-08-16 DIAGNOSIS — Z7901 Long term (current) use of anticoagulants: Secondary | ICD-10-CM

## 2014-08-16 DIAGNOSIS — I059 Rheumatic mitral valve disease, unspecified: Secondary | ICD-10-CM

## 2014-08-16 LAB — POCT INR: INR: 3

## 2014-09-13 ENCOUNTER — Ambulatory Visit (INDEPENDENT_AMBULATORY_CARE_PROVIDER_SITE_OTHER): Payer: Medicare Other | Admitting: Pharmacist Clinician (PhC)/ Clinical Pharmacy Specialist

## 2014-09-13 DIAGNOSIS — Z7901 Long term (current) use of anticoagulants: Secondary | ICD-10-CM

## 2014-09-13 DIAGNOSIS — I059 Rheumatic mitral valve disease, unspecified: Secondary | ICD-10-CM | POA: Diagnosis not present

## 2014-09-13 LAB — POCT INR: INR: 3.1

## 2014-10-25 ENCOUNTER — Ambulatory Visit (INDEPENDENT_AMBULATORY_CARE_PROVIDER_SITE_OTHER): Payer: Medicare Other | Admitting: *Deleted

## 2014-10-25 DIAGNOSIS — Z7901 Long term (current) use of anticoagulants: Secondary | ICD-10-CM

## 2014-10-25 DIAGNOSIS — I059 Rheumatic mitral valve disease, unspecified: Secondary | ICD-10-CM

## 2014-10-25 LAB — POCT INR: INR: 3.6

## 2014-11-16 ENCOUNTER — Encounter: Payer: Self-pay | Admitting: Podiatry

## 2014-11-16 ENCOUNTER — Ambulatory Visit (INDEPENDENT_AMBULATORY_CARE_PROVIDER_SITE_OTHER): Payer: Medicare Other | Admitting: Podiatry

## 2014-11-16 ENCOUNTER — Ambulatory Visit (INDEPENDENT_AMBULATORY_CARE_PROVIDER_SITE_OTHER): Payer: Medicare Other

## 2014-11-16 VITALS — BP 125/75 | HR 66 | Resp 15

## 2014-11-16 DIAGNOSIS — M79675 Pain in left toe(s): Secondary | ICD-10-CM

## 2014-11-16 DIAGNOSIS — D169 Benign neoplasm of bone and articular cartilage, unspecified: Secondary | ICD-10-CM

## 2014-11-16 DIAGNOSIS — L84 Corns and callosities: Secondary | ICD-10-CM | POA: Diagnosis not present

## 2014-11-16 NOTE — Progress Notes (Signed)
   Subjective:    Patient ID: Terry [name], male    DOB: 1936-02-11, 79 y.o.   MRN: [id number]  HPI  Pt presents with left 5th toe corn/pressure area that is very painful when wearing shoes. First noticed 2 weeks ago.   Review of Systems  All other systems reviewed and are negative.      Objective:   Physical Exam        Assessment & Plan:

## 2014-11-18 NOTE — Progress Notes (Signed)
Subjective:     Patient ID: Terry [name], male   DOB: 12-03-1935, 79 y.o.   MRN: [id number]  HPI patient presents with painful lesion on the inside of the fifth toe that it's making hard for her to ambulate with an states that he has tried to trim it and pad it without relief   Review of Systems  All other systems reviewed and are negative.      Objective:   Physical Exam  Constitutional: He is oriented to person, place, and time.  Cardiovascular: Intact distal pulses.   Musculoskeletal: Normal range of motion.  Neurological: He is oriented to person, place, and time.  Skin: Skin is warm.  Nursing note and vitals reviewed.  neurovascular status intact muscle strength adequate range of motion within normal with exostotic keratotic-type lesion on the inner medial side of the fifth toe left with a rotated fifth toe against the fourth toe. Patient's noted to have no other forefoot pathology and does have good digital perfusion and is well oriented 3     Assessment:     Distal medial exostosis with keratotic lesion fifth toe left foot with structural abnormality of the toe    Plan:     H&P and x-ray reviewed and today I debrided the lesion and applied padding to take pressure off of it. If symptoms persist we will need to discuss exostectomy which I reviewed with patient

## 2014-11-22 ENCOUNTER — Ambulatory Visit (INDEPENDENT_AMBULATORY_CARE_PROVIDER_SITE_OTHER): Payer: Medicare Other | Admitting: Pharmacist

## 2014-11-22 DIAGNOSIS — Z7901 Long term (current) use of anticoagulants: Secondary | ICD-10-CM

## 2014-11-22 DIAGNOSIS — I059 Rheumatic mitral valve disease, unspecified: Secondary | ICD-10-CM

## 2014-11-22 LAB — POCT INR: INR: 3

## 2015-01-03 ENCOUNTER — Ambulatory Visit (INDEPENDENT_AMBULATORY_CARE_PROVIDER_SITE_OTHER): Payer: Medicare Other | Admitting: *Deleted

## 2015-01-03 DIAGNOSIS — I059 Rheumatic mitral valve disease, unspecified: Secondary | ICD-10-CM | POA: Diagnosis not present

## 2015-01-03 DIAGNOSIS — Z7901 Long term (current) use of anticoagulants: Secondary | ICD-10-CM | POA: Diagnosis not present

## 2015-01-03 LAB — POCT INR: INR: 3.6

## 2015-01-27 ENCOUNTER — Other Ambulatory Visit: Payer: Self-pay | Admitting: Cardiovascular Disease

## 2015-01-31 ENCOUNTER — Ambulatory Visit (INDEPENDENT_AMBULATORY_CARE_PROVIDER_SITE_OTHER): Payer: Medicare Other | Admitting: Pharmacist Clinician (PhC)/ Clinical Pharmacy Specialist

## 2015-01-31 DIAGNOSIS — Z7901 Long term (current) use of anticoagulants: Secondary | ICD-10-CM

## 2015-01-31 DIAGNOSIS — I059 Rheumatic mitral valve disease, unspecified: Secondary | ICD-10-CM

## 2015-01-31 LAB — POCT INR: INR: 3.7

## 2015-02-06 ENCOUNTER — Encounter: Payer: Self-pay | Admitting: Cardiovascular Disease

## 2015-02-14 ENCOUNTER — Ambulatory Visit (INDEPENDENT_AMBULATORY_CARE_PROVIDER_SITE_OTHER): Payer: Medicare Other | Admitting: *Deleted

## 2015-02-14 DIAGNOSIS — Z7901 Long term (current) use of anticoagulants: Secondary | ICD-10-CM | POA: Diagnosis not present

## 2015-02-14 DIAGNOSIS — I059 Rheumatic mitral valve disease, unspecified: Secondary | ICD-10-CM | POA: Diagnosis not present

## 2015-02-14 LAB — POCT INR: INR: 3.2

## 2015-02-15 ENCOUNTER — Telehealth: Payer: Self-pay | Admitting: *Deleted

## 2015-02-15 NOTE — Telephone Encounter (Signed)
called both numbers, no answer, need fm hx & status.Marland KitchenMarland Kitchen

## 2015-02-21 ENCOUNTER — Ambulatory Visit (INDEPENDENT_AMBULATORY_CARE_PROVIDER_SITE_OTHER): Payer: Medicare Other | Admitting: Cardiovascular Disease

## 2015-02-21 ENCOUNTER — Encounter: Payer: Self-pay | Admitting: Cardiovascular Disease

## 2015-02-21 VITALS — BP 102/62 | HR 71 | Ht 72.0 in | Wt 166.2 lb

## 2015-02-21 DIAGNOSIS — Z954 Presence of other heart-valve replacement: Secondary | ICD-10-CM

## 2015-02-21 DIAGNOSIS — I059 Rheumatic mitral valve disease, unspecified: Secondary | ICD-10-CM

## 2015-02-21 DIAGNOSIS — Z952 Presence of prosthetic heart valve: Secondary | ICD-10-CM

## 2015-02-21 NOTE — Progress Notes (Signed)
Terry [name]  Date of Birth  1935/12/27 [name] / [facility] [address]     Terry [name] [place], [place]  [address] [phone]  Fax  [phone]   Problem list: 1. Mitral valve replacement- mitral valve repair while in [place] and had mitral valve replacement here with Dr. Servando [name] 2003 2. Hyperlipidemia  History of Present Illness:  Terry [name] is a day 79 year old gentleman with a history of mitral valve replacement. He has done very well. His INR Levels have been therapeutic. He has not had any problems.  He is doing very well.  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   Oct. 24, 2014:  Terry [name] has done well.  Still volunteering at the hospital.   No CP , no dyspnea.   INR levels are ok.    November 19, 2013:  Terry [name] is doing ok but has been having more palpitations associated with a very brief episode of lightheadedness. He's not had any episodes of syncope. These episodes seem to be more common in the morning and can occur when he is walking down the hall. He also has an occasionally at night when he is relaxing.  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 He will walks for exercise everyday.   02/21/2015.  Doing well. 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  No further episodes of lightheadedness.    Current Outpatient Prescriptions on File Prior to Visit  Medication Sig Dispense Refill  . Calcium Carbonate-Vitamin D (CALCIUM + D PO) Take 600 mg by mouth daily.    . Cholecalciferol (VITAMIN D3) 1000 UNITS tablet Take 1,000 Units by mouth daily.      . CRESTOR 20 MG tablet Take 1 tablet by mouth Daily.    . metoprolol succinate (TOPROL-XL) 100 MG 24 hr tablet TAKE 1 AND 1/2 TABLET BY MOUTH DAILY 90 tablet 0  . multivitamin (THERAGRAN) per tablet Take 1 tablet by mouth daily.      . niacin 500 MG tablet Take 500 mg  by mouth 2 (two) times daily with a meal.      . omeprazole (PRILOSEC OTC) 20 MG tablet Take 20 mg by mouth every other day.      . propranolol (INDERAL) 10 MG tablet Take 10 mg by mouth as needed.      . timolol (TIMOPTIC) 0.5 % ophthalmic solution Place 1 drop into the left eye daily.    Marland Kitchen warfarin (COUMADIN) 5 MG tablet TAKE AS DIRECTED BY ANTICOAGULATION CLINIC. 100 tablet 1   No current facility-administered medications on file prior to visit.    Allergies  Allergen Reactions  . Sulfa Drugs Cross Reactors     Past Medical History  Diagnosis Date  . Mitral valve prolapse   . Dyslipidemia   . Long-term (current) use of anticoagulants   . Hyperlipidemia   . Ventricular tachycardia ([place])   . Osteoporosis   . Benign prostatic hypertrophy     Past Surgical History  Procedure Laterality Date  . Cardiac catheterization  08/19/2001    Essentially smooth and normal coronary arteries  . Coronary stent placement  11/03/2001    Mitral valve replacement with #35 St. Jude mechanical mitral valve prosthesis and closure of  left atrial appendage  . Appendico-vesicostomy cutaneous      History  Smoking status  . Former Smoker  . Quit date: 05/13/1964  Smokeless tobacco  . Not on file    History  Alcohol Use: Not on file    No family history on file.  Reviw of Systems:  Reviewed in the HPI.  All other systems are negative.  Physical Exam: BP 102/62 mmHg  Pulse 71  Ht 6' (1.829 m)  Wt 75.388 kg (166 lb 3.2 oz)  BMI 22.54 kg/m2 The patient is alert and oriented x 3.  The mood and affect are normal.   Skin: warm and dry.  Color is normal.    HEENT:   the sclera are nonicteric.  The mucous membranes are moist.  The carotids are 2+ without bruits.  There is no thyromegaly.  There is no JVD.    Lungs: clear.  The chest wall is non tender.    Heart: regular rate with a mechanical S1 and S2.  There are no murmurs, gallops, or rubs. The PMI is not displaced.     Abdomin:  good bowel sounds.  There is no guarding or rebound.  There is no hepatosplenomegaly or tenderness.  There are no masses.   Extremities:  no clubbing, cyanosis, or edema.  The legs are without rashes.  The distal pulses are intact.   Neuro:  Cranial nerves II - XII are intact.  Motor and sensory functions are intact.    The gait is normal.  EKG:  OCt, 11, 2016:  NSR at 71. Normal ecg    Assessment / Plan:    1. Mitral valve replacement- mitral valve repair while in [place] and had mitral valve replacement here with Dr. Servando [name] 2003 Doing well.  Continue coumadin   2. Hyperlipidemia -  Managed by Dr. Joylene [name]  Will see him in 1 year.     Terry [name], Terry Cheng, MD  02/21/2015 2:45 PM    Terry [name],  [place], [place]  [address] Pager [phone] Phone: [phone]; Fax: [phone]   [facility]  [address] [place], [place]  [address] [phone]   Fax [phone]

## 2015-02-21 NOTE — Patient Instructions (Signed)
Medication Instructions:  Your physician recommends that you continue on your current medications as directed. Please refer to the Current Medication list given to you today.   Labwork: None Ordered   Testing/Procedures: None Ordered   Follow-Up: Your physician wants you to follow-up in: 1 year with Dr. Nahser.  You will receive a reminder letter in the mail two months in advance. If you don't receive a letter, please call our office to schedule the follow-up appointment.      

## 2015-03-07 ENCOUNTER — Ambulatory Visit (INDEPENDENT_AMBULATORY_CARE_PROVIDER_SITE_OTHER): Payer: Medicare Other | Admitting: Pharmacist Clinician (PhC)/ Clinical Pharmacy Specialist

## 2015-03-07 DIAGNOSIS — Z952 Presence of prosthetic heart valve: Secondary | ICD-10-CM

## 2015-03-07 DIAGNOSIS — I059 Rheumatic mitral valve disease, unspecified: Secondary | ICD-10-CM

## 2015-03-07 DIAGNOSIS — Z7901 Long term (current) use of anticoagulants: Secondary | ICD-10-CM

## 2015-03-07 DIAGNOSIS — Z954 Presence of other heart-valve replacement: Secondary | ICD-10-CM

## 2015-03-07 LAB — POCT INR: INR: 2.6

## 2015-03-24 ENCOUNTER — Other Ambulatory Visit: Payer: Self-pay | Admitting: Cardiovascular Disease

## 2015-04-04 ENCOUNTER — Ambulatory Visit (INDEPENDENT_AMBULATORY_CARE_PROVIDER_SITE_OTHER): Payer: Medicare Other | Admitting: *Deleted

## 2015-04-04 DIAGNOSIS — I059 Rheumatic mitral valve disease, unspecified: Secondary | ICD-10-CM | POA: Diagnosis not present

## 2015-04-04 DIAGNOSIS — Z954 Presence of other heart-valve replacement: Secondary | ICD-10-CM

## 2015-04-04 DIAGNOSIS — Z7901 Long term (current) use of anticoagulants: Secondary | ICD-10-CM

## 2015-04-04 DIAGNOSIS — Z952 Presence of prosthetic heart valve: Secondary | ICD-10-CM

## 2015-04-04 LAB — POCT INR: INR: 2.7

## 2015-05-02 ENCOUNTER — Ambulatory Visit (INDEPENDENT_AMBULATORY_CARE_PROVIDER_SITE_OTHER): Payer: Medicare Other | Admitting: Pharmacist

## 2015-05-02 DIAGNOSIS — Z7901 Long term (current) use of anticoagulants: Secondary | ICD-10-CM | POA: Diagnosis not present

## 2015-05-02 DIAGNOSIS — Z954 Presence of other heart-valve replacement: Secondary | ICD-10-CM

## 2015-05-02 DIAGNOSIS — Z952 Presence of prosthetic heart valve: Secondary | ICD-10-CM

## 2015-05-02 DIAGNOSIS — I059 Rheumatic mitral valve disease, unspecified: Secondary | ICD-10-CM

## 2015-05-02 LAB — POCT INR: INR: 2.5

## 2015-05-15 ENCOUNTER — Other Ambulatory Visit: Payer: Self-pay | Admitting: Cardiovascular Disease

## 2015-06-13 ENCOUNTER — Other Ambulatory Visit: Payer: Self-pay | Admitting: Internal Medicine

## 2015-06-13 ENCOUNTER — Ambulatory Visit (INDEPENDENT_AMBULATORY_CARE_PROVIDER_SITE_OTHER): Payer: Medicare Other | Admitting: *Deleted

## 2015-06-13 DIAGNOSIS — I059 Rheumatic mitral valve disease, unspecified: Secondary | ICD-10-CM

## 2015-06-13 DIAGNOSIS — Z7901 Long term (current) use of anticoagulants: Secondary | ICD-10-CM | POA: Diagnosis not present

## 2015-06-13 DIAGNOSIS — G459 Transient cerebral ischemic attack, unspecified: Secondary | ICD-10-CM

## 2015-06-13 DIAGNOSIS — Z954 Presence of other heart-valve replacement: Secondary | ICD-10-CM

## 2015-06-13 DIAGNOSIS — Z952 Presence of prosthetic heart valve: Secondary | ICD-10-CM

## 2015-06-13 LAB — POCT INR: INR: 2.2

## 2015-06-19 ENCOUNTER — Ambulatory Visit
Admission: RE | Admit: 2015-06-19 | Discharge: 2015-06-19 | Disposition: A | Discharge status: Home / Self Care | Payer: Medicare Other | Source: Ambulatory Visit | Attending: Internal Medicine | Admitting: Internal Medicine

## 2015-06-19 DIAGNOSIS — G459 Transient cerebral ischemic attack, unspecified: Secondary | ICD-10-CM

## 2015-06-30 ENCOUNTER — Ambulatory Visit (INDEPENDENT_AMBULATORY_CARE_PROVIDER_SITE_OTHER): Payer: Medicare Other | Admitting: *Deleted

## 2015-06-30 DIAGNOSIS — Z954 Presence of other heart-valve replacement: Secondary | ICD-10-CM

## 2015-06-30 DIAGNOSIS — Z7901 Long term (current) use of anticoagulants: Secondary | ICD-10-CM

## 2015-06-30 DIAGNOSIS — Z952 Presence of prosthetic heart valve: Secondary | ICD-10-CM

## 2015-06-30 DIAGNOSIS — I059 Rheumatic mitral valve disease, unspecified: Secondary | ICD-10-CM | POA: Diagnosis not present

## 2015-06-30 LAB — POCT INR: INR: 3

## 2015-07-03 ENCOUNTER — Encounter: Payer: Self-pay | Admitting: Cardiovascular Disease

## 2015-07-21 ENCOUNTER — Ambulatory Visit (INDEPENDENT_AMBULATORY_CARE_PROVIDER_SITE_OTHER): Payer: Medicare Other | Admitting: *Deleted

## 2015-07-21 DIAGNOSIS — Z952 Presence of prosthetic heart valve: Secondary | ICD-10-CM

## 2015-07-21 DIAGNOSIS — I059 Rheumatic mitral valve disease, unspecified: Secondary | ICD-10-CM | POA: Diagnosis not present

## 2015-07-21 DIAGNOSIS — Z7901 Long term (current) use of anticoagulants: Secondary | ICD-10-CM | POA: Diagnosis not present

## 2015-07-21 DIAGNOSIS — Z954 Presence of other heart-valve replacement: Secondary | ICD-10-CM | POA: Diagnosis not present

## 2015-07-21 LAB — POCT INR: INR: 3.4

## 2015-08-18 ENCOUNTER — Ambulatory Visit (INDEPENDENT_AMBULATORY_CARE_PROVIDER_SITE_OTHER): Payer: Medicare Other | Admitting: Pharmacist

## 2015-08-18 DIAGNOSIS — Z954 Presence of other heart-valve replacement: Secondary | ICD-10-CM

## 2015-08-18 DIAGNOSIS — Z7901 Long term (current) use of anticoagulants: Secondary | ICD-10-CM

## 2015-08-18 DIAGNOSIS — Z952 Presence of prosthetic heart valve: Secondary | ICD-10-CM

## 2015-08-18 DIAGNOSIS — I059 Rheumatic mitral valve disease, unspecified: Secondary | ICD-10-CM

## 2015-08-18 LAB — POCT INR: INR: 3.4

## 2015-08-23 ENCOUNTER — Other Ambulatory Visit (HOSPITAL_COMMUNITY): Payer: Self-pay | Admitting: Internal Medicine

## 2015-08-23 ENCOUNTER — Ambulatory Visit (HOSPITAL_COMMUNITY)
Admission: RE | Admit: 2015-08-23 | Discharge: 2015-08-23 | Disposition: A | Discharge status: Home / Self Care | Payer: Medicare Other | Source: Ambulatory Visit | Attending: Vascular Surgery | Admitting: Vascular Surgery

## 2015-08-23 DIAGNOSIS — Z7901 Long term (current) use of anticoagulants: Secondary | ICD-10-CM | POA: Insufficient documentation

## 2015-08-23 DIAGNOSIS — E785 Hyperlipidemia, unspecified: Secondary | ICD-10-CM | POA: Insufficient documentation

## 2015-08-23 DIAGNOSIS — G459 Transient cerebral ischemic attack, unspecified: Secondary | ICD-10-CM | POA: Diagnosis not present

## 2015-09-15 ENCOUNTER — Ambulatory Visit (INDEPENDENT_AMBULATORY_CARE_PROVIDER_SITE_OTHER): Payer: Medicare Other | Admitting: *Deleted

## 2015-09-15 DIAGNOSIS — I059 Rheumatic mitral valve disease, unspecified: Secondary | ICD-10-CM | POA: Diagnosis not present

## 2015-09-15 DIAGNOSIS — Z952 Presence of prosthetic heart valve: Secondary | ICD-10-CM

## 2015-09-15 DIAGNOSIS — Z7901 Long term (current) use of anticoagulants: Secondary | ICD-10-CM

## 2015-09-15 DIAGNOSIS — Z954 Presence of other heart-valve replacement: Secondary | ICD-10-CM | POA: Diagnosis not present

## 2015-09-15 LAB — POCT INR: INR: 3.3

## 2015-10-27 ENCOUNTER — Ambulatory Visit (INDEPENDENT_AMBULATORY_CARE_PROVIDER_SITE_OTHER): Payer: Medicare Other | Admitting: *Deleted

## 2015-10-27 DIAGNOSIS — I059 Rheumatic mitral valve disease, unspecified: Secondary | ICD-10-CM | POA: Diagnosis not present

## 2015-10-27 DIAGNOSIS — Z954 Presence of other heart-valve replacement: Secondary | ICD-10-CM

## 2015-10-27 DIAGNOSIS — Z952 Presence of prosthetic heart valve: Secondary | ICD-10-CM

## 2015-10-27 DIAGNOSIS — Z7901 Long term (current) use of anticoagulants: Secondary | ICD-10-CM

## 2015-10-27 LAB — POCT INR: INR: 3.6

## 2015-11-20 ENCOUNTER — Telehealth: Payer: Self-pay | Admitting: Cardiovascular Disease

## 2015-11-20 NOTE — Telephone Encounter (Signed)
Spoke with Doroteo Bradford RN in [facility] and she advised that patient be scheduled for appointment with them 3-4 days after starting the medication and to encourage the patient to increase intake of leafy greens.  I spoke with patient who states he doesn't usually eat a lot of leafy greens but he will see what he can do.  He is scheduled for appointment in coumadin clinic on Friday 7/14.  He thanked me for the call.

## 2015-11-20 NOTE — Telephone Encounter (Signed)
New Message  Pt call requesting to speak with RN about new meds doxycycline 100mg   that may affect his coumadin.pt has concerns Please call back to discuss

## 2015-11-24 ENCOUNTER — Ambulatory Visit (INDEPENDENT_AMBULATORY_CARE_PROVIDER_SITE_OTHER): Payer: Medicare Other | Admitting: *Deleted

## 2015-11-24 DIAGNOSIS — I059 Rheumatic mitral valve disease, unspecified: Secondary | ICD-10-CM

## 2015-11-24 DIAGNOSIS — Z954 Presence of other heart-valve replacement: Secondary | ICD-10-CM

## 2015-11-24 DIAGNOSIS — Z7901 Long term (current) use of anticoagulants: Secondary | ICD-10-CM | POA: Diagnosis not present

## 2015-11-24 DIAGNOSIS — Z952 Presence of prosthetic heart valve: Secondary | ICD-10-CM

## 2015-11-24 LAB — POCT INR: INR: 3.8

## 2015-12-05 ENCOUNTER — Ambulatory Visit (INDEPENDENT_AMBULATORY_CARE_PROVIDER_SITE_OTHER): Payer: Medicare Other | Admitting: Pharmacist

## 2015-12-05 DIAGNOSIS — I059 Rheumatic mitral valve disease, unspecified: Secondary | ICD-10-CM | POA: Diagnosis not present

## 2015-12-05 DIAGNOSIS — Z7901 Long term (current) use of anticoagulants: Secondary | ICD-10-CM | POA: Diagnosis not present

## 2015-12-05 DIAGNOSIS — Z954 Presence of other heart-valve replacement: Secondary | ICD-10-CM

## 2015-12-05 DIAGNOSIS — Z952 Presence of prosthetic heart valve: Secondary | ICD-10-CM

## 2015-12-05 LAB — POCT INR: INR: 3.6

## 2015-12-19 ENCOUNTER — Ambulatory Visit (INDEPENDENT_AMBULATORY_CARE_PROVIDER_SITE_OTHER): Payer: Medicare Other

## 2015-12-19 DIAGNOSIS — I059 Rheumatic mitral valve disease, unspecified: Secondary | ICD-10-CM

## 2015-12-19 DIAGNOSIS — Z7901 Long term (current) use of anticoagulants: Secondary | ICD-10-CM

## 2015-12-19 DIAGNOSIS — Z954 Presence of other heart-valve replacement: Secondary | ICD-10-CM

## 2015-12-19 DIAGNOSIS — Z952 Presence of prosthetic heart valve: Secondary | ICD-10-CM

## 2015-12-19 LAB — POCT INR: INR: 3.3

## 2015-12-22 ENCOUNTER — Other Ambulatory Visit: Payer: Self-pay | Admitting: Cardiovascular Disease

## 2016-01-09 ENCOUNTER — Ambulatory Visit (INDEPENDENT_AMBULATORY_CARE_PROVIDER_SITE_OTHER): Payer: Medicare Other | Admitting: *Deleted

## 2016-01-09 DIAGNOSIS — Z7901 Long term (current) use of anticoagulants: Secondary | ICD-10-CM | POA: Diagnosis not present

## 2016-01-09 DIAGNOSIS — I059 Rheumatic mitral valve disease, unspecified: Secondary | ICD-10-CM

## 2016-01-09 DIAGNOSIS — Z954 Presence of other heart-valve replacement: Secondary | ICD-10-CM | POA: Diagnosis not present

## 2016-01-09 DIAGNOSIS — Z952 Presence of prosthetic heart valve: Secondary | ICD-10-CM

## 2016-01-09 LAB — POCT INR: INR: 3.3

## 2016-02-09 ENCOUNTER — Ambulatory Visit (INDEPENDENT_AMBULATORY_CARE_PROVIDER_SITE_OTHER): Payer: Medicare Other | Admitting: *Deleted

## 2016-02-09 DIAGNOSIS — Z954 Presence of other heart-valve replacement: Secondary | ICD-10-CM | POA: Diagnosis not present

## 2016-02-09 DIAGNOSIS — I059 Rheumatic mitral valve disease, unspecified: Secondary | ICD-10-CM | POA: Diagnosis not present

## 2016-02-09 DIAGNOSIS — Z7901 Long term (current) use of anticoagulants: Secondary | ICD-10-CM | POA: Diagnosis not present

## 2016-02-09 DIAGNOSIS — Z952 Presence of prosthetic heart valve: Secondary | ICD-10-CM

## 2016-02-09 LAB — POCT INR: INR: 2.9

## 2016-02-19 ENCOUNTER — Encounter: Payer: Self-pay | Admitting: Cardiovascular Disease

## 2016-03-01 ENCOUNTER — Encounter: Payer: Self-pay | Admitting: Cardiovascular Disease

## 2016-03-01 ENCOUNTER — Ambulatory Visit (INDEPENDENT_AMBULATORY_CARE_PROVIDER_SITE_OTHER): Payer: Medicare Other | Admitting: Cardiovascular Disease

## 2016-03-01 VITALS — BP 114/64 | HR 71 | Ht 71.5 in | Wt 166.0 lb

## 2016-03-01 DIAGNOSIS — Z7901 Long term (current) use of anticoagulants: Secondary | ICD-10-CM | POA: Diagnosis not present

## 2016-03-01 DIAGNOSIS — Z952 Presence of prosthetic heart valve: Secondary | ICD-10-CM | POA: Diagnosis not present

## 2016-03-01 NOTE — Patient Instructions (Signed)
Medication Instructions:  Your physician recommends that you continue on your current medications as directed. Please refer to the Current Medication list given to you today.   Labwork: None Ordered   Testing/Procedures: None Ordered   Follow-Up: Your physician wants you to follow-up in: 1 year with Dr. Nahser.  You will receive a reminder letter in the mail two months in advance. If you don't receive a letter, please call our office to schedule the follow-up appointment.   If you need a refill on your cardiac medications before your next appointment, please call your pharmacy.   Thank you for choosing [facility]! Barett Whidbee, RN [phone]    

## 2016-03-01 NOTE — Progress Notes (Signed)
Terry [name]  Date of Birth  1936/03/11 [facility] / [facility] [id number]     [place], [place]  [address] [phone]  Fax  [phone]   Problem list: 1. Mitral valve replacement- mitral valve repair while in [place] and had mitral valve replacement here with Dr. Servando Snare 2003 2. Hyperlipidemia  History of Present Illness:  Terry [name] is a day 80 year old gentleman with a history of mitral valve replacement. He has done very well. His INR Levels have been therapeutic. He has not had any problems.  He is doing very well.  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   Oct. 24, 2014:  Terry [name] has done well.  Still volunteering at the hospital.   No CP , no dyspnea.   INR levels are ok.    November 19, 2013:  Terry [name] is doing ok but has been having more palpitations associated with a very brief episode of lightheadedness. He's not had any episodes of syncope. These episodes seem to be more common in the morning and can occur when he is walking down the hall. He also has an occasionally at night when he is relaxing.  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 He will walks for exercise everyday.   02/21/2015.  Doing well. 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  No further episodes of lightheadedness.   Oct. 20, 2017:   Doing well Still working at [employer]  INR levels are great.     Current Outpatient Prescriptions on File Prior to Visit  Medication Sig Dispense Refill  . Calcium Carbonate-Vitamin D (CALCIUM + D PO) Take 600 mg by mouth daily.    . Cholecalciferol (VITAMIN D3) 1000 UNITS tablet Take 1,000 Units by mouth daily.      . CRESTOR 20 MG tablet Take 1 tablet by mouth Daily.    . metoprolol succinate (TOPROL-XL) 100 MG 24 hr tablet TAKE 1 AND 1/2 TABLET BY MOUTH DAILY 135 tablet 3  . multivitamin  (THERAGRAN) per tablet Take 1 tablet by mouth daily.      . niacin 500 MG tablet Take 500 mg by mouth 2 (two) times daily with a meal.      . omeprazole (PRILOSEC OTC) 20 MG tablet Take 20 mg by mouth every other day.      . propranolol (INDERAL) 10 MG tablet Take 10 mg by mouth as needed.      . warfarin (COUMADIN) 5 MG tablet TAKE AS DIRECTED BY ANTICOAGULATION CLINIC. 100 tablet 1   No current facility-administered medications on file prior to visit.     Allergies  Allergen Reactions  . Sulfa Drugs Cross Reactors     Past Medical History:  Diagnosis Date  . Benign prostatic hypertrophy   . Dyslipidemia   . Hyperlipidemia   . Long-term (current) use of anticoagulants   . Mitral valve prolapse   . Osteoporosis   . Ventricular tachycardia [facility])     Past Surgical History:  Procedure Laterality Date  . APPENDICO-VESICOSTOMY CUTANEOUS    . CARDIAC CATHETERIZATION  08/19/2001   Essentially smooth and normal coronary arteries  . CORONARY STENT PLACEMENT  11/03/2001   Mitral valve replacement with #35 St. Jude mechanical mitral valve prosthesis and closure of  left atrial appendage    History  Smoking Status  . Former Smoker  . Quit date: 05/13/1964  Smokeless Tobacco  . Never Used    History  Alcohol Use No    No family history on file.  Reviw of Systems:  Reviewed in the HPI.  All other systems are negative.  Physical Exam: BP 114/64   Pulse 71   Ht 5' 11.5" (1.816 m)   Wt 166 lb (75.3 kg)   BMI 22.83 kg/m  The patient is alert and oriented x 3.  The mood and affect are normal.   Skin: warm and dry.  Color is normal.    HEENT:   the sclera are nonicteric.  The mucous membranes are moist.  The carotids are 2+ without bruits.  There is no thyromegaly.  There is no JVD.    Lungs: clear.  The chest wall is non tender.    Heart: regular rate with a mechanical S1 and S2.  There are no murmurs, gallops, or rubs. The PMI is not displaced.     Abdomin: good bowel  sounds.  There is no guarding or rebound.  There is no hepatosplenomegaly or tenderness.  There are no masses.   Extremities:  no clubbing, cyanosis, or edema.  The legs are without rashes.  The distal pulses are intact.   Neuro:  Cranial nerves II - XII are intact.  Motor and sensory functions are intact.    The gait is normal.  EKG:  Oct. 26, 2017:  NSR at 71.  Normal ECG   Assessment / Plan:   1. Mitral valve replacement- mitral valve repair while in [place] and had mitral valve replacement here with Dr. Servando Snare 2003 Doing well.  Continue coumadin  INR levels have been theraputic   2. Hyperlipidemia -  Managed by Dr. Joylene Draft  Will see him in 1 year.     Mertie Moores, MD  03/01/2016 2:23 PM    [place],  [place], [place]  [address] Pager [phone] Phone: [phone]; Fax: [phone]   [facility]  [address] [place], [place]  [address] [phone]   Fax [phone]

## 2016-03-08 ENCOUNTER — Ambulatory Visit (INDEPENDENT_AMBULATORY_CARE_PROVIDER_SITE_OTHER): Payer: Medicare Other | Admitting: Pharmacist

## 2016-03-08 DIAGNOSIS — Z7901 Long term (current) use of anticoagulants: Secondary | ICD-10-CM | POA: Diagnosis not present

## 2016-03-08 DIAGNOSIS — I059 Rheumatic mitral valve disease, unspecified: Secondary | ICD-10-CM

## 2016-03-08 DIAGNOSIS — Z952 Presence of prosthetic heart valve: Secondary | ICD-10-CM | POA: Diagnosis not present

## 2016-03-08 LAB — POCT INR: INR: 3.7

## 2016-03-25 ENCOUNTER — Other Ambulatory Visit: Payer: Self-pay | Admitting: Cardiovascular Disease

## 2016-04-02 ENCOUNTER — Ambulatory Visit (INDEPENDENT_AMBULATORY_CARE_PROVIDER_SITE_OTHER): Payer: Medicare Other | Admitting: *Deleted

## 2016-04-02 DIAGNOSIS — I059 Rheumatic mitral valve disease, unspecified: Secondary | ICD-10-CM | POA: Diagnosis not present

## 2016-04-02 DIAGNOSIS — Z7901 Long term (current) use of anticoagulants: Secondary | ICD-10-CM | POA: Diagnosis not present

## 2016-04-02 DIAGNOSIS — Z952 Presence of prosthetic heart valve: Secondary | ICD-10-CM | POA: Diagnosis not present

## 2016-04-02 LAB — POCT INR: INR: 1.8

## 2016-04-12 ENCOUNTER — Telehealth: Payer: Self-pay | Admitting: Pharmacist

## 2016-04-12 NOTE — Telephone Encounter (Signed)
Pt called stating he forgot to take his 5mg  dose of Coumadin yesterday. Advised him to take a full tablet today, usual dose tomorrow, and full tablet on Sunday to make up for missed dose. Advised him to keep his next appt with [place] on Tuesday 12/5.

## 2016-04-16 ENCOUNTER — Ambulatory Visit (INDEPENDENT_AMBULATORY_CARE_PROVIDER_SITE_OTHER): Payer: Medicare Other | Admitting: *Deleted

## 2016-04-16 DIAGNOSIS — Z952 Presence of prosthetic heart valve: Secondary | ICD-10-CM | POA: Diagnosis not present

## 2016-04-16 DIAGNOSIS — Z7901 Long term (current) use of anticoagulants: Secondary | ICD-10-CM

## 2016-04-16 DIAGNOSIS — I059 Rheumatic mitral valve disease, unspecified: Secondary | ICD-10-CM | POA: Diagnosis not present

## 2016-04-16 LAB — POCT INR: INR: 2.8

## 2016-05-10 ENCOUNTER — Ambulatory Visit (INDEPENDENT_AMBULATORY_CARE_PROVIDER_SITE_OTHER): Payer: Medicare Other

## 2016-05-10 DIAGNOSIS — I059 Rheumatic mitral valve disease, unspecified: Secondary | ICD-10-CM

## 2016-05-10 DIAGNOSIS — Z952 Presence of prosthetic heart valve: Secondary | ICD-10-CM | POA: Diagnosis not present

## 2016-05-10 DIAGNOSIS — Z7901 Long term (current) use of anticoagulants: Secondary | ICD-10-CM | POA: Diagnosis not present

## 2016-05-10 LAB — POCT INR: INR: 2.6

## 2016-06-07 ENCOUNTER — Ambulatory Visit (INDEPENDENT_AMBULATORY_CARE_PROVIDER_SITE_OTHER): Payer: Medicare Other

## 2016-06-07 DIAGNOSIS — Z7901 Long term (current) use of anticoagulants: Secondary | ICD-10-CM

## 2016-06-07 DIAGNOSIS — I059 Rheumatic mitral valve disease, unspecified: Secondary | ICD-10-CM

## 2016-06-07 DIAGNOSIS — Z952 Presence of prosthetic heart valve: Secondary | ICD-10-CM

## 2016-06-07 LAB — POCT INR: INR: 1.8

## 2016-07-05 ENCOUNTER — Ambulatory Visit (INDEPENDENT_AMBULATORY_CARE_PROVIDER_SITE_OTHER): Payer: Medicare Other | Admitting: *Deleted

## 2016-07-05 DIAGNOSIS — Z7901 Long term (current) use of anticoagulants: Secondary | ICD-10-CM

## 2016-07-05 DIAGNOSIS — Z952 Presence of prosthetic heart valve: Secondary | ICD-10-CM

## 2016-07-05 DIAGNOSIS — I059 Rheumatic mitral valve disease, unspecified: Secondary | ICD-10-CM | POA: Diagnosis not present

## 2016-07-05 LAB — POCT INR: INR: 3.5

## 2016-08-02 ENCOUNTER — Ambulatory Visit (INDEPENDENT_AMBULATORY_CARE_PROVIDER_SITE_OTHER): Payer: Medicare Other

## 2016-08-02 DIAGNOSIS — Z7901 Long term (current) use of anticoagulants: Secondary | ICD-10-CM | POA: Diagnosis not present

## 2016-08-02 DIAGNOSIS — I059 Rheumatic mitral valve disease, unspecified: Secondary | ICD-10-CM

## 2016-08-02 DIAGNOSIS — Z952 Presence of prosthetic heart valve: Secondary | ICD-10-CM

## 2016-08-02 LAB — POCT INR: INR: 2.2

## 2016-08-23 ENCOUNTER — Ambulatory Visit (INDEPENDENT_AMBULATORY_CARE_PROVIDER_SITE_OTHER): Payer: Medicare Other | Admitting: *Deleted

## 2016-08-23 DIAGNOSIS — Z7901 Long term (current) use of anticoagulants: Secondary | ICD-10-CM

## 2016-08-23 DIAGNOSIS — I059 Rheumatic mitral valve disease, unspecified: Secondary | ICD-10-CM

## 2016-08-23 DIAGNOSIS — Z952 Presence of prosthetic heart valve: Secondary | ICD-10-CM | POA: Diagnosis not present

## 2016-08-23 LAB — POCT INR: INR: 2.9

## 2016-09-20 ENCOUNTER — Ambulatory Visit (INDEPENDENT_AMBULATORY_CARE_PROVIDER_SITE_OTHER): Payer: Medicare Other | Admitting: Pharmacist

## 2016-09-20 DIAGNOSIS — Z952 Presence of prosthetic heart valve: Secondary | ICD-10-CM

## 2016-09-20 DIAGNOSIS — Z7901 Long term (current) use of anticoagulants: Secondary | ICD-10-CM | POA: Diagnosis not present

## 2016-09-20 DIAGNOSIS — I059 Rheumatic mitral valve disease, unspecified: Secondary | ICD-10-CM

## 2016-09-20 LAB — POCT INR: INR: 2.4

## 2016-09-26 ENCOUNTER — Other Ambulatory Visit: Payer: Self-pay | Admitting: Cardiovascular Disease

## 2016-10-18 ENCOUNTER — Ambulatory Visit (INDEPENDENT_AMBULATORY_CARE_PROVIDER_SITE_OTHER): Payer: Medicare Other | Admitting: *Deleted

## 2016-10-18 DIAGNOSIS — Z952 Presence of prosthetic heart valve: Secondary | ICD-10-CM

## 2016-10-18 LAB — POCT INR: INR: 2.6

## 2016-10-23 ENCOUNTER — Other Ambulatory Visit: Payer: Self-pay | Admitting: Cardiovascular Disease

## 2016-11-15 ENCOUNTER — Ambulatory Visit (INDEPENDENT_AMBULATORY_CARE_PROVIDER_SITE_OTHER): Payer: Medicare Other | Admitting: *Deleted

## 2016-11-15 DIAGNOSIS — Z952 Presence of prosthetic heart valve: Secondary | ICD-10-CM | POA: Diagnosis not present

## 2016-11-15 LAB — POCT INR: INR: 2.3

## 2016-12-10 ENCOUNTER — Ambulatory Visit (INDEPENDENT_AMBULATORY_CARE_PROVIDER_SITE_OTHER): Payer: Medicare Other | Admitting: *Deleted

## 2016-12-10 DIAGNOSIS — Z952 Presence of prosthetic heart valve: Secondary | ICD-10-CM

## 2016-12-10 LAB — POCT INR: INR: 2.4

## 2016-12-11 ENCOUNTER — Ambulatory Visit (INDEPENDENT_AMBULATORY_CARE_PROVIDER_SITE_OTHER): Payer: Medicare Other | Admitting: Podiatry

## 2016-12-11 ENCOUNTER — Other Ambulatory Visit: Payer: Self-pay | Admitting: Podiatry

## 2016-12-11 ENCOUNTER — Ambulatory Visit (INDEPENDENT_AMBULATORY_CARE_PROVIDER_SITE_OTHER): Payer: Medicare Other

## 2016-12-11 ENCOUNTER — Encounter: Payer: Self-pay | Admitting: Podiatry

## 2016-12-11 VITALS — BP 147/81 | HR 74 | Resp 16

## 2016-12-11 DIAGNOSIS — D169 Benign neoplasm of bone and articular cartilage, unspecified: Secondary | ICD-10-CM

## 2016-12-11 DIAGNOSIS — L84 Corns and callosities: Secondary | ICD-10-CM

## 2016-12-11 DIAGNOSIS — M79672 Pain in left foot: Secondary | ICD-10-CM

## 2016-12-11 NOTE — Progress Notes (Signed)
Subjective:    Patient ID: Terry [name], male   DOB: 81 y.o.   MRN: [id number]   HPI patient present stating he wants to get the spur checked on his left fifth toe. States it's been bothersome and he's been wearing a pad    ROS      Objective:  Physical Exam neurovascular status intact with distal medial keratotic lesion with exostotic spot fifth digit left     Assessment:    Exostosis fifth digit left     Plan:    Advised on wider shoes padding cushioning and this was accomplished today

## 2016-12-27 ENCOUNTER — Ambulatory Visit (INDEPENDENT_AMBULATORY_CARE_PROVIDER_SITE_OTHER): Payer: Medicare Other

## 2016-12-27 DIAGNOSIS — Z952 Presence of prosthetic heart valve: Secondary | ICD-10-CM | POA: Diagnosis not present

## 2016-12-27 LAB — POCT INR: INR: 3.3

## 2017-01-17 ENCOUNTER — Ambulatory Visit (INDEPENDENT_AMBULATORY_CARE_PROVIDER_SITE_OTHER): Payer: Medicare Other | Admitting: *Deleted

## 2017-01-17 DIAGNOSIS — Z952 Presence of prosthetic heart valve: Secondary | ICD-10-CM | POA: Diagnosis not present

## 2017-01-17 DIAGNOSIS — Z5181 Encounter for therapeutic drug level monitoring: Secondary | ICD-10-CM

## 2017-01-17 LAB — POCT INR: INR: 3.4

## 2017-02-14 ENCOUNTER — Ambulatory Visit (INDEPENDENT_AMBULATORY_CARE_PROVIDER_SITE_OTHER): Payer: Medicare Other | Admitting: Pharmacist

## 2017-02-14 DIAGNOSIS — Z7901 Long term (current) use of anticoagulants: Secondary | ICD-10-CM | POA: Diagnosis not present

## 2017-02-14 DIAGNOSIS — Z952 Presence of prosthetic heart valve: Secondary | ICD-10-CM

## 2017-02-14 LAB — POCT INR: INR: 2.9

## 2017-03-20 ENCOUNTER — Other Ambulatory Visit: Payer: Self-pay | Admitting: Cardiovascular Disease

## 2017-03-28 ENCOUNTER — Ambulatory Visit (INDEPENDENT_AMBULATORY_CARE_PROVIDER_SITE_OTHER): Payer: Medicare Other | Admitting: *Deleted

## 2017-03-28 DIAGNOSIS — Z952 Presence of prosthetic heart valve: Secondary | ICD-10-CM

## 2017-03-28 DIAGNOSIS — Z5181 Encounter for therapeutic drug level monitoring: Secondary | ICD-10-CM | POA: Diagnosis not present

## 2017-03-28 LAB — POCT INR: INR: 3.1

## 2017-03-28 NOTE — Patient Instructions (Signed)
Continue on same dosage 1 tablet daily except for 1/2 tablet on Mondays, Wednesdays, and Fridays.  Repeat INR in 6 weeks.

## 2017-03-31 ENCOUNTER — Other Ambulatory Visit: Payer: Self-pay | Admitting: Cardiovascular Disease

## 2017-05-09 ENCOUNTER — Ambulatory Visit: Payer: Medicare Other | Admitting: *Deleted

## 2017-05-09 DIAGNOSIS — Z952 Presence of prosthetic heart valve: Secondary | ICD-10-CM | POA: Diagnosis not present

## 2017-05-09 DIAGNOSIS — Z5181 Encounter for therapeutic drug level monitoring: Secondary | ICD-10-CM

## 2017-05-09 LAB — POCT INR: INR: 2.5

## 2017-05-09 NOTE — Patient Instructions (Signed)
Description   Continue on same dosage 1 tablet daily except for 1/2 tablet on Mondays, Wednesdays, and Fridays.  Repeat INR in 6 weeks.

## 2017-05-27 ENCOUNTER — Encounter: Payer: Self-pay | Admitting: Cardiovascular Disease

## 2017-05-27 ENCOUNTER — Ambulatory Visit: Payer: Medicare Other | Admitting: Cardiovascular Disease

## 2017-05-27 VITALS — BP 122/82 | HR 70 | Ht 71.5 in | Wt 173.0 lb

## 2017-05-27 DIAGNOSIS — Z7901 Long term (current) use of anticoagulants: Secondary | ICD-10-CM

## 2017-05-27 DIAGNOSIS — Z952 Presence of prosthetic heart valve: Secondary | ICD-10-CM | POA: Diagnosis not present

## 2017-05-27 NOTE — Progress Notes (Signed)
Terry [name]  Date of Birth  10-20-1935 [facility] / [facility] [address]     [place], [place]  [address] [phone]  Fax  [phone]   Problem list: 1. Mitral valve replacement- mitral valve repair while in [place] and had mitral valve replacement here with Dr. Servando Snare 2003 2. Hyperlipidemia  History of Present Illness:  Rush Landmark is a day 82 year old gentleman with a history of mitral valve replacement. He has done very well. His INR Levels have been therapeutic. He has not had any problems.  He is doing very well.  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   Oct. 24, 2014:  Rush Landmark has done well.  Still volunteering at the hospital.   No CP , no dyspnea.   INR levels are ok.    November 19, 2013:  Rush Landmark is doing ok but has been having more palpitations associated with a very brief episode of lightheadedness. He's not had any episodes of syncope. These episodes seem to be more common in the morning and can occur when he is walking down the hall. He also has an occasionally at night when he is relaxing.  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 He will walks for exercise everyday.   02/21/2015.  Doing well. Still working  at the hospital .  [employer] 5 days a week - 3 miles  No CP or dyspnea.  No further episodes of lightheadedness.   Oct. 20, 2017:   Doing well Still working at [employer]  INR levels are great.   Jan. 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  Tries to walk every day  No CP or dyspnea.  INR is theraputic.    Current Outpatient Medications on File Prior to Visit  Medication Sig Dispense Refill  . aspirin 81 MG tablet Take 81 mg by mouth daily.    . Calcium Carbonate-Vitamin D (CALCIUM + D PO) Take 600 mg by mouth daily.    . Cholecalciferol (VITAMIN D3) 1000 UNITS tablet Take 1,000 Units  by mouth daily.      . COSOPT PF 22.3-6.8 MG/ML SOLN ophthalmic solution APPLY 1 DROP INTO BOTH EYES TWICE A DAY  3  . CRESTOR 20 MG tablet Take 1 tablet by mouth Daily.    . metoprolol succinate (TOPROL-XL) 100 MG 24 hr tablet Take 1 and 1/2 tablet by mouth daily. Please keep upcoming appt in January before anymore refills. Thank you 135 tablet 0  . multivitamin (THERAGRAN) per tablet Take 1 tablet by mouth daily.      Marland Kitchen omeprazole (PRILOSEC OTC) 20 MG tablet Take 20 mg by mouth every other day.      . propranolol (INDERAL) 10 MG tablet Take 10 mg by mouth as needed.      . warfarin (COUMADIN) 5 MG tablet TAKE AS DIRECTED BY ANTICOAGULATION CLINIC. 90 tablet 0  . ZIOPTAN 0.0015 % SOLN APPLY 1 DROP INTO BOTH EYES AT BEDTIME  3   No current facility-administered medications on file prior to visit.     Allergies  Allergen Reactions  . Ciprofloxacin     Heartburn  . Sulfa Drugs Cross Reactors     Past Medical History:  Diagnosis Date  . Benign prostatic hypertrophy   . Dyslipidemia   . Hyperlipidemia   . Long-term (  current) use of anticoagulants   . Mitral valve prolapse   . Osteoporosis   . Ventricular tachycardia [facility])     Past Surgical History:  Procedure Laterality Date  . APPENDICO-VESICOSTOMY CUTANEOUS    . CARDIAC CATHETERIZATION  08/19/2001   Essentially smooth and normal coronary arteries  . CORONARY STENT PLACEMENT  11/03/2001   Mitral valve replacement with #35 St. Jude mechanical mitral valve prosthesis and closure of  left atrial appendage    Social History   Tobacco Use  Smoking Status Former Smoker  . Last attempt to quit: 05/13/1964  . Years since quitting: 53.0  Smokeless Tobacco Never Used    Social History   Substance and Sexual Activity  Alcohol Use No    History reviewed. No pertinent family history.  Reviw of Systems:  Noted in current history.  All other systems are negative.  Physical Exam: Blood pressure 122/82, pulse 70, height 5'  11.5" (1.816 m), weight 173 lb (78.5 kg).  GEN:  Well nourished, well developed in no acute distress HEENT: Normal NECK: No JVD; No carotid bruits LYMPHATICS: No lymphadenopathy CARDIAC:  RR , mechanical S1,  Normal S2. RESPIRATORY:  Clear to auscultation without rales, wheezing or rhonchi  ABDOMEN: Soft, non-tender, non-distended MUSCULOSKELETAL:  No edema; No deformity  SKIN: Warm and dry NEUROLOGIC:  Alert and oriented x 3  EKG: May 27, 2017: Normal sinus rhythm at 70.  Minimal voltage criteria for LVH.  Quite likely normal variant.   Assessment / Plan:   1. Mitral valve replacement- mitral valve repair while in [place] and had mitral valve replacement here with Dr. Servando Snare 2003   2. Hyperlipidemia -   Managed by primary MD   Doing well.  Will see him in 1 year.    Mertie Moores, MD  05/27/2017 2:46 PM    [place] [place],  [place], [place]  [address] Pager [phone] Phone: [phone]; Fax: [phone]

## 2017-05-27 NOTE — Patient Instructions (Signed)

## 2017-06-19 ENCOUNTER — Other Ambulatory Visit: Payer: Self-pay | Admitting: Cardiovascular Disease

## 2017-06-27 ENCOUNTER — Ambulatory Visit: Payer: Medicare Other | Admitting: *Deleted

## 2017-06-27 DIAGNOSIS — Z5181 Encounter for therapeutic drug level monitoring: Secondary | ICD-10-CM

## 2017-06-27 DIAGNOSIS — Z952 Presence of prosthetic heart valve: Secondary | ICD-10-CM

## 2017-06-27 LAB — POCT INR: INR: 3

## 2017-06-27 NOTE — Patient Instructions (Signed)
Description   Continue on same dosage 1 tablet daily except for 1/2 tablet on Mondays, Wednesdays, and Fridays.  Repeat INR in 6 weeks.

## 2017-07-20 ENCOUNTER — Other Ambulatory Visit: Payer: Self-pay | Admitting: Cardiovascular Disease

## 2017-08-08 ENCOUNTER — Ambulatory Visit: Payer: Medicare Other | Admitting: *Deleted

## 2017-08-08 DIAGNOSIS — Z952 Presence of prosthetic heart valve: Secondary | ICD-10-CM | POA: Diagnosis not present

## 2017-08-08 DIAGNOSIS — Z5181 Encounter for therapeutic drug level monitoring: Secondary | ICD-10-CM

## 2017-08-08 LAB — POCT INR: INR: 2.8

## 2017-08-08 NOTE — Patient Instructions (Signed)
Description   Continue on same dosage 1 tablet daily except for 1/2 tablet on Mondays, Wednesdays, and Fridays.  Repeat INR in 6 weeks.

## 2017-08-13 ENCOUNTER — Telehealth: Payer: Self-pay | Admitting: Cardiovascular Disease

## 2017-08-13 NOTE — Telephone Encounter (Signed)
Spoke with patient and advised per Dr. Acie Fredrickson that he continue aspirin in addition to coumadin for his mitral valve replacement unless he is having problems with bleeding. Patient denies problems with bleeding and verbalized understanding to continue current therapy. He thanked me for the prompt call.

## 2017-08-13 NOTE — Telephone Encounter (Signed)
New Message  Had yearly physical with internist, daily low dose aspirin  Pt c/o medication issue:  1. Name of Medication: Aspirin  2. How are you currently taking this medication (dosage and times per day)? Take 81 mg by mouth daily  3. Are you having a reaction (difficulty breathing--STAT)? no  4. What is your medication issue? Pt ants to know if he still needs to take is low dose aspirin. Please call

## 2017-09-16 ENCOUNTER — Ambulatory Visit: Payer: Medicare Other | Admitting: *Deleted

## 2017-09-16 DIAGNOSIS — Z5181 Encounter for therapeutic drug level monitoring: Secondary | ICD-10-CM | POA: Diagnosis not present

## 2017-09-16 DIAGNOSIS — Z952 Presence of prosthetic heart valve: Secondary | ICD-10-CM

## 2017-09-16 LAB — POCT INR: INR: 2.6

## 2017-09-16 NOTE — Patient Instructions (Signed)
Description   Continue on same dosage 1 tablet daily except for 1/2 tablet on Mondays, Wednesdays, and Fridays.  Repeat INR in 6 weeks.

## 2017-10-28 ENCOUNTER — Ambulatory Visit: Payer: Medicare Other

## 2017-10-28 DIAGNOSIS — Z952 Presence of prosthetic heart valve: Secondary | ICD-10-CM

## 2017-10-28 DIAGNOSIS — Z5181 Encounter for therapeutic drug level monitoring: Secondary | ICD-10-CM

## 2017-10-28 LAB — POCT INR: INR: 3.2 — AB (ref 2.0–3.0)

## 2017-10-28 NOTE — Patient Instructions (Signed)
Description   Continue on same dosage 1 tablet daily except for 1/2 tablet on Mondays, Wednesdays, and Fridays.  Repeat INR in 6 weeks.

## 2017-11-17 ENCOUNTER — Other Ambulatory Visit: Payer: Self-pay | Admitting: Cardiovascular Disease

## 2017-12-09 ENCOUNTER — Ambulatory Visit: Payer: Medicare Other | Admitting: *Deleted

## 2017-12-09 DIAGNOSIS — Z5181 Encounter for therapeutic drug level monitoring: Secondary | ICD-10-CM | POA: Diagnosis not present

## 2017-12-09 DIAGNOSIS — Z952 Presence of prosthetic heart valve: Secondary | ICD-10-CM

## 2017-12-09 LAB — POCT INR: INR: 4.2 — AB (ref 2.0–3.0)

## 2017-12-09 NOTE — Patient Instructions (Addendum)
Description   Hold tomorrow's dose then continue on same dosage 1 tablet daily except for 1/2 tablet on Mondays, Wednesdays, and Fridays.  Repeat INR in 4 weeks.

## 2018-01-06 ENCOUNTER — Ambulatory Visit: Payer: Medicare Other | Admitting: *Deleted

## 2018-01-06 DIAGNOSIS — Z5181 Encounter for therapeutic drug level monitoring: Secondary | ICD-10-CM

## 2018-01-06 DIAGNOSIS — Z952 Presence of prosthetic heart valve: Secondary | ICD-10-CM

## 2018-01-06 LAB — POCT INR: INR: 3.2 — AB (ref 2.0–3.0)

## 2018-01-06 NOTE — Patient Instructions (Signed)
Description   Continue on same dosage 1 tablet daily except for 1/2 tablet on Mondays, Wednesdays, and Fridays.  Repeat INR in 4 weeks.

## 2018-02-03 ENCOUNTER — Ambulatory Visit: Payer: Medicare Other | Admitting: *Deleted

## 2018-02-03 DIAGNOSIS — Z5181 Encounter for therapeutic drug level monitoring: Secondary | ICD-10-CM | POA: Diagnosis not present

## 2018-02-03 DIAGNOSIS — Z952 Presence of prosthetic heart valve: Secondary | ICD-10-CM | POA: Diagnosis not present

## 2018-02-03 LAB — POCT INR: INR: 3.3 — AB (ref 2.0–3.0)

## 2018-02-03 NOTE — Patient Instructions (Signed)
Description   Continue on same dosage 1 tablet daily except for 1/2 tablet on Mondays, Wednesdays, and Fridays.  Repeat INR in 5 weeks.

## 2018-03-09 ENCOUNTER — Ambulatory Visit: Payer: Medicare Other

## 2018-03-09 DIAGNOSIS — Z952 Presence of prosthetic heart valve: Secondary | ICD-10-CM | POA: Diagnosis not present

## 2018-03-09 DIAGNOSIS — Z5181 Encounter for therapeutic drug level monitoring: Secondary | ICD-10-CM

## 2018-03-09 LAB — POCT INR: INR: 4.5 — AB (ref 2.0–3.0)

## 2018-03-09 NOTE — Patient Instructions (Signed)
Description   Skip today's dosage of Coumadin, then resume same dosage 1 tablet daily except for 1/2 tablet on Mondays, Wednesdays, and Fridays.  Repeat INR in 2 weeks.

## 2018-03-13 ENCOUNTER — Other Ambulatory Visit: Payer: Self-pay | Admitting: Cardiovascular Disease

## 2018-03-24 ENCOUNTER — Ambulatory Visit: Payer: Medicare Other | Admitting: *Deleted

## 2018-03-24 DIAGNOSIS — Z5181 Encounter for therapeutic drug level monitoring: Secondary | ICD-10-CM

## 2018-03-24 DIAGNOSIS — Z952 Presence of prosthetic heart valve: Secondary | ICD-10-CM

## 2018-03-24 LAB — POCT INR: INR: 3.5 — AB (ref 2.0–3.0)

## 2018-03-24 NOTE — Patient Instructions (Signed)
Description   Continue taking 1 tablet daily except for 1/2 tablet on Mondays, Wednesdays, and Fridays.  Repeat INR in 3 weeks.

## 2018-04-14 ENCOUNTER — Ambulatory Visit: Payer: Medicare Other | Admitting: *Deleted

## 2018-04-14 DIAGNOSIS — Z952 Presence of prosthetic heart valve: Secondary | ICD-10-CM

## 2018-04-14 DIAGNOSIS — Z5181 Encounter for therapeutic drug level monitoring: Secondary | ICD-10-CM | POA: Diagnosis not present

## 2018-04-14 LAB — POCT INR: INR: 2.2 (ref 2.0–3.0)

## 2018-04-14 NOTE — Patient Instructions (Addendum)
Description   Today take another 1/2 tablet then continue taking 1 tablet daily except for 1/2 tablet on Mondays, Wednesdays, and Fridays.  Repeat INR in 2 weeks.

## 2018-05-01 ENCOUNTER — Ambulatory Visit: Payer: Medicare Other | Admitting: Pharmacist

## 2018-05-01 DIAGNOSIS — Z952 Presence of prosthetic heart valve: Secondary | ICD-10-CM | POA: Diagnosis not present

## 2018-05-01 DIAGNOSIS — Z5181 Encounter for therapeutic drug level monitoring: Secondary | ICD-10-CM

## 2018-05-01 LAB — POCT INR: INR: 3.1 — AB (ref 2.0–3.0)

## 2018-05-01 NOTE — Patient Instructions (Addendum)
Description   Continue taking 1 tablet daily except for 1/2 tablet on Mondays, Wednesdays, and Fridays.  Repeat INR in 3 weeks.

## 2018-05-28 ENCOUNTER — Encounter: Payer: Self-pay | Admitting: Cardiovascular Disease

## 2018-05-28 ENCOUNTER — Ambulatory Visit: Payer: Medicare Other | Admitting: Cardiovascular Disease

## 2018-05-28 ENCOUNTER — Ambulatory Visit (INDEPENDENT_AMBULATORY_CARE_PROVIDER_SITE_OTHER): Payer: Medicare Other

## 2018-05-28 VITALS — BP 136/70 | HR 72 | Ht 71.5 in | Wt 163.8 lb

## 2018-05-28 DIAGNOSIS — Z5181 Encounter for therapeutic drug level monitoring: Secondary | ICD-10-CM | POA: Diagnosis not present

## 2018-05-28 DIAGNOSIS — Z7901 Long term (current) use of anticoagulants: Secondary | ICD-10-CM | POA: Diagnosis not present

## 2018-05-28 DIAGNOSIS — Z952 Presence of prosthetic heart valve: Secondary | ICD-10-CM | POA: Diagnosis not present

## 2018-05-28 LAB — POCT INR: INR: 2.8 (ref 2.0–3.0)

## 2018-05-28 NOTE — Patient Instructions (Signed)

## 2018-05-28 NOTE — Progress Notes (Signed)
Terry [name]  Date of Birth  12-14-1935     Problem list: 1. Mitral valve replacement- mitral valve repair while in [place] and had mitral valve replacement here with Dr. Servando Snare 2003 2. Hyperlipidemia    Notes from 2013:   Terry [name] is a day 83 year old gentleman with a history of mitral valve replacement. He has done very well. His INR Levels have been therapeutic. He has not had any problems.  He is doing very well.  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   Oct. 24, 2014:  Terry [name] has done well.  Still volunteering at the hospital.   No CP , no dyspnea.   INR levels are ok.    November 19, 2013:  Terry [name] is doing ok but has been having more palpitations associated with a very brief episode of lightheadedness. He's not had any episodes of syncope. These episodes seem to be more common in the morning and can occur when he is walking down the hall. He also has an occasionally at night when he is relaxing.  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 He will walks for exercise everyday.   02/21/2015.  Doing well. Still working  at the hospital .  Terry [name] 5 days a week - 3 miles  No CP or dyspnea.  No further episodes of lightheadedness.   Oct. 20, 2017:   Doing well Still working at [employer]  INR levels are great.   Jan. 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  Tries to walk every day  No CP or dyspnea.  INR is theraputic.   May 28, 2018: Terry [name] is seen today for follow-up of his mitral valve replacement.  He is done well.  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  Walks daily  INR levels have been therapeutic.  He denies any bleeding.  He does have occasional bruises.   Current Outpatient Medications on File Prior to Visit  Medication Sig Dispense Refill  . aspirin 81 MG tablet Take 81 mg by mouth daily.    .  Calcium Carbonate-Vitamin D (CALCIUM + D PO) Take 600 mg by mouth daily.    . Cholecalciferol (VITAMIN D3) 1000 UNITS tablet Take 1,000 Units by mouth daily.      . COSOPT PF 22.3-6.8 MG/ML SOLN ophthalmic solution APPLY 1 DROP INTO BOTH EYES TWICE A DAY  3  . CRESTOR 20 MG tablet Take 1 tablet by mouth Daily.    . metoprolol succinate (TOPROL-XL) 100 MG 24 hr tablet TAKE 1 AND 1/2 TABLET BY MOUTH DAILY. 135 tablet 3  . multivitamin (THERAGRAN) per tablet Take 1 tablet by mouth daily.      Marland Kitchen omeprazole (PRILOSEC OTC) 20 MG tablet Take 20 mg by mouth every other day.      . propranolol (INDERAL) 10 MG tablet Take 10 mg by mouth as needed.      . warfarin (COUMADIN) 5 MG tablet TAKE AS DIRECTED BY ANTICOAGULATION CLINIC. 90 tablet 1  . ZIOPTAN 0.0015 % SOLN APPLY 1 DROP INTO BOTH EYES AT BEDTIME  3   No current facility-administered medications on file prior to visit.     Allergies  Allergen Reactions  . Ciprofloxacin     Heartburn  . Sulfa Drugs Cross Reactors     Past  Medical History:  Diagnosis Date  . Benign prostatic hypertrophy   . Dyslipidemia   . Hyperlipidemia   . Long-term (current) use of anticoagulants   . Mitral valve prolapse   . Osteoporosis   . Ventricular tachycardia [name])     Past Surgical History:  Procedure Laterality Date  . APPENDICO-VESICOSTOMY CUTANEOUS    . CARDIAC CATHETERIZATION  08/19/2001   Essentially smooth and normal coronary arteries  . CORONARY STENT PLACEMENT  11/03/2001   Mitral valve replacement with #35 St. Jude mechanical mitral valve prosthesis and closure of  left atrial appendage    Social History   Tobacco Use  Smoking Status Former Smoker  . Last attempt to quit: 05/13/1964  . Years since quitting: 54.0  Smokeless Tobacco Never Used    Social History   Substance and Sexual Activity  Alcohol Use No    History reviewed. No pertinent family history.  Reviw of Systems:  Noted in current history.  All other systems are  negative.  Physical Exam: Blood pressure 136/70, pulse 72, height 5' 11.5" (1.816 m), weight 163 lb 12.8 oz (74.3 kg).  GEN:  Well nourished, well developed in no acute distress HEENT: Normal NECK: No JVD; No carotid bruits LYMPHATICS: No lymphadenopathy CARDIAC: RRR, mechanical first heart sound.  Occasional premature beat RESPIRATORY:  Clear to auscultation without rales, wheezing or rhonchi  ABDOMEN: Soft, non-tender, non-distended MUSCULOSKELETAL:  No edema; No deformity  SKIN: Warm and dry NEUROLOGIC:  Alert and oriented x 3   EKG:  May 28, 2018: Normal sinus rhythm at a rate of 72.  Frequent premature ventricular contractions.   Assessment / Plan:   1. Mitral valve replacement- mitral valve repair while in [place] and had mitral valve replacement here with Dr. Servando Snare 2003 Seems to be doing well  His INR levels have been therapeutic.  He has occasional bruising but has not had any bleeding issues.   2. Hyperlipidemia -   Is doing very very well.  He has his levels checked by Dr. Joylene Draft.  He walks on a daily basis.      Mertie Moores, MD  05/28/2018 8:57 AM    [place],  [place], [place]  [address] Pager [phone] Phone: [phone]; Fax: [phone]

## 2018-05-28 NOTE — Patient Instructions (Signed)
Description   Continue taking 1 tablet daily except for 1/2 tablet on Mondays, Wednesdays, and Fridays.  Recheck INR in 4 weeks.

## 2018-06-21 ENCOUNTER — Other Ambulatory Visit: Payer: Self-pay | Admitting: Cardiovascular Disease

## 2018-06-26 ENCOUNTER — Ambulatory Visit: Payer: Medicare Other | Admitting: Pharmacist

## 2018-06-26 DIAGNOSIS — Z5181 Encounter for therapeutic drug level monitoring: Secondary | ICD-10-CM

## 2018-06-26 DIAGNOSIS — Z952 Presence of prosthetic heart valve: Secondary | ICD-10-CM

## 2018-06-26 LAB — POCT INR: INR: 2.2 (ref 2.0–3.0)

## 2018-06-26 NOTE — Patient Instructions (Signed)
Description   Take an extra 1/2 tablet today (since already took 1/2 tablet) then continue taking 1 tablet daily except for 1/2 tablet on Mondays, Wednesdays, and Fridays.  Recheck INR in 3 weeks.

## 2018-07-17 ENCOUNTER — Ambulatory Visit: Payer: Medicare Other | Admitting: *Deleted

## 2018-07-17 DIAGNOSIS — Z952 Presence of prosthetic heart valve: Secondary | ICD-10-CM | POA: Diagnosis not present

## 2018-07-17 DIAGNOSIS — Z5181 Encounter for therapeutic drug level monitoring: Secondary | ICD-10-CM | POA: Diagnosis not present

## 2018-07-17 LAB — POCT INR: INR: 2 (ref 2.0–3.0)

## 2018-07-17 NOTE — Patient Instructions (Signed)
Description   Take an extra 1/2 tablet today (since already took 1/2 tablet) then start taking 1 tablet daily except for 1/2 tablet on Mondays and Fridays.  Recheck INR in 2 weeks.

## 2018-07-30 ENCOUNTER — Telehealth: Payer: Self-pay

## 2018-07-30 NOTE — Telephone Encounter (Signed)

## 2018-07-31 ENCOUNTER — Other Ambulatory Visit: Payer: Self-pay

## 2018-07-31 ENCOUNTER — Ambulatory Visit (INDEPENDENT_AMBULATORY_CARE_PROVIDER_SITE_OTHER): Payer: Medicare Other | Admitting: *Deleted

## 2018-07-31 DIAGNOSIS — Z952 Presence of prosthetic heart valve: Secondary | ICD-10-CM | POA: Diagnosis not present

## 2018-07-31 DIAGNOSIS — Z5181 Encounter for therapeutic drug level monitoring: Secondary | ICD-10-CM

## 2018-07-31 LAB — POCT INR: INR: 3 (ref 2.0–3.0)

## 2018-07-31 NOTE — Patient Instructions (Signed)
Description   Continue taking 1 tablet daily except for 1/2 tablet on Mondays and Fridays.  Recheck INR in 4 weeks.

## 2018-08-27 ENCOUNTER — Telehealth: Payer: Self-pay

## 2018-08-27 NOTE — Telephone Encounter (Signed)

## 2018-08-28 ENCOUNTER — Other Ambulatory Visit: Payer: Self-pay

## 2018-08-28 ENCOUNTER — Ambulatory Visit (INDEPENDENT_AMBULATORY_CARE_PROVIDER_SITE_OTHER): Payer: Medicare Other

## 2018-08-28 DIAGNOSIS — Z5181 Encounter for therapeutic drug level monitoring: Secondary | ICD-10-CM

## 2018-08-28 DIAGNOSIS — Z952 Presence of prosthetic heart valve: Secondary | ICD-10-CM | POA: Diagnosis not present

## 2018-08-28 LAB — POCT INR: INR: 4.5 — AB (ref 2.0–3.0)

## 2018-08-28 NOTE — Patient Instructions (Signed)
Description   Called spoke with pt, advised to skip tomorrow's dosage of Coumadin, then start taking 1 tablet daily except 1/2 tablet on Mondays, Wednesdays, and Fridays.  Recheck INR in 3 weeks.  Call [phone] with new medications.

## 2018-09-16 ENCOUNTER — Telehealth: Payer: Self-pay

## 2018-09-16 NOTE — Telephone Encounter (Signed)

## 2018-09-18 ENCOUNTER — Other Ambulatory Visit: Payer: Self-pay

## 2018-09-18 ENCOUNTER — Ambulatory Visit (INDEPENDENT_AMBULATORY_CARE_PROVIDER_SITE_OTHER): Payer: Medicare Other | Admitting: Pharmacist

## 2018-09-18 DIAGNOSIS — Z5181 Encounter for therapeutic drug level monitoring: Secondary | ICD-10-CM

## 2018-09-18 DIAGNOSIS — Z952 Presence of prosthetic heart valve: Secondary | ICD-10-CM | POA: Diagnosis not present

## 2018-09-18 LAB — POCT INR: INR: 2.7 (ref 2.0–3.0)

## 2018-10-13 ENCOUNTER — Telehealth: Payer: Self-pay

## 2018-10-13 NOTE — Telephone Encounter (Signed)

## 2018-10-16 ENCOUNTER — Ambulatory Visit: Payer: Medicare Other | Admitting: *Deleted

## 2018-10-16 ENCOUNTER — Other Ambulatory Visit: Payer: Self-pay

## 2018-10-16 DIAGNOSIS — Z952 Presence of prosthetic heart valve: Secondary | ICD-10-CM | POA: Diagnosis not present

## 2018-10-16 DIAGNOSIS — Z5181 Encounter for therapeutic drug level monitoring: Secondary | ICD-10-CM

## 2018-10-16 LAB — POCT INR: INR: 3 (ref 2.0–3.0)

## 2018-10-16 NOTE — Patient Instructions (Signed)
Description   Continue taking 1 tablet daily except 1/2 tablet on Mondays, Wednesdays, and Fridays.  Recheck INR in 5 weeks.  Call [phone] with new medications.

## 2018-10-23 ENCOUNTER — Other Ambulatory Visit: Payer: Self-pay | Admitting: Cardiovascular Disease

## 2018-11-16 ENCOUNTER — Telehealth: Payer: Self-pay

## 2018-11-16 NOTE — Telephone Encounter (Signed)

## 2018-11-20 ENCOUNTER — Ambulatory Visit (INDEPENDENT_AMBULATORY_CARE_PROVIDER_SITE_OTHER): Payer: Medicare Other | Admitting: *Deleted

## 2018-11-20 ENCOUNTER — Other Ambulatory Visit: Payer: Self-pay

## 2018-11-20 DIAGNOSIS — Z952 Presence of prosthetic heart valve: Secondary | ICD-10-CM

## 2018-11-20 DIAGNOSIS — Z5181 Encounter for therapeutic drug level monitoring: Secondary | ICD-10-CM

## 2018-11-20 LAB — POCT INR: INR: 3 (ref 2.0–3.0)

## 2018-11-20 NOTE — Patient Instructions (Signed)
Description   Continue taking 1 tablet daily except 1/2 tablet on Mondays, Wednesdays, and Fridays.  Recheck INR in 6 weeks.  Call [phone] with new medications.

## 2019-01-01 ENCOUNTER — Ambulatory Visit (INDEPENDENT_AMBULATORY_CARE_PROVIDER_SITE_OTHER): Payer: Medicare Other | Admitting: *Deleted

## 2019-01-01 ENCOUNTER — Other Ambulatory Visit: Payer: Self-pay

## 2019-01-01 DIAGNOSIS — Z5181 Encounter for therapeutic drug level monitoring: Secondary | ICD-10-CM | POA: Diagnosis not present

## 2019-01-01 DIAGNOSIS — Z952 Presence of prosthetic heart valve: Secondary | ICD-10-CM

## 2019-01-01 LAB — POCT INR: INR: 3.3 — AB (ref 2.0–3.0)

## 2019-01-01 NOTE — Patient Instructions (Signed)
Description   Continue taking 1 tablet daily except 1/2 tablet on Mondays, Wednesdays, and Fridays.  Recheck INR in 6 weeks.  Call [phone] with new medications.    

## 2019-02-12 ENCOUNTER — Other Ambulatory Visit: Payer: Self-pay

## 2019-02-12 ENCOUNTER — Ambulatory Visit (INDEPENDENT_AMBULATORY_CARE_PROVIDER_SITE_OTHER): Payer: Medicare Other | Admitting: *Deleted

## 2019-02-12 DIAGNOSIS — Z5181 Encounter for therapeutic drug level monitoring: Secondary | ICD-10-CM

## 2019-02-12 DIAGNOSIS — Z952 Presence of prosthetic heart valve: Secondary | ICD-10-CM

## 2019-02-12 LAB — POCT INR: INR: 2.7 (ref 2.0–3.0)

## 2019-02-12 NOTE — Patient Instructions (Signed)
Description   Continue taking 1 tablet daily except 1/2 tablet on Mondays, Wednesdays, and Fridays.  Recheck INR in 7 weeks.  Call [phone] with new medications.

## 2019-03-30 ENCOUNTER — Other Ambulatory Visit: Payer: Self-pay

## 2019-03-30 ENCOUNTER — Ambulatory Visit (INDEPENDENT_AMBULATORY_CARE_PROVIDER_SITE_OTHER): Payer: Medicare Other | Admitting: *Deleted

## 2019-03-30 DIAGNOSIS — Z5181 Encounter for therapeutic drug level monitoring: Secondary | ICD-10-CM | POA: Diagnosis not present

## 2019-03-30 DIAGNOSIS — Z952 Presence of prosthetic heart valve: Secondary | ICD-10-CM | POA: Diagnosis not present

## 2019-03-30 LAB — POCT INR: INR: 2.5 (ref 2.0–3.0)

## 2019-03-30 NOTE — Patient Instructions (Signed)
Description   Today take 1.5 tablets then continue taking 1 tablet daily except 1/2 tablet on Mondays, Wednesdays, and Fridays.  Recheck INR in 4 weeks.  Call [phone] with new medications.

## 2019-04-05 ENCOUNTER — Other Ambulatory Visit: Payer: Self-pay | Admitting: Cardiovascular Disease

## 2019-04-27 ENCOUNTER — Other Ambulatory Visit: Payer: Self-pay

## 2019-04-27 ENCOUNTER — Ambulatory Visit: Payer: Medicare Other | Admitting: *Deleted

## 2019-04-27 DIAGNOSIS — Z952 Presence of prosthetic heart valve: Secondary | ICD-10-CM | POA: Diagnosis not present

## 2019-04-27 DIAGNOSIS — Z5181 Encounter for therapeutic drug level monitoring: Secondary | ICD-10-CM | POA: Diagnosis not present

## 2019-04-27 LAB — POCT INR: INR: 2.5 (ref 2.0–3.0)

## 2019-04-27 NOTE — Patient Instructions (Addendum)
Description   Start taking 1 tablet daily except 1/2 tablet on Mondays and Fridays.  Recheck INR in 4 weeks with MD appt.  Call [phone] with new medications.

## 2019-05-28 ENCOUNTER — Encounter: Payer: Self-pay | Admitting: Cardiovascular Disease

## 2019-05-28 ENCOUNTER — Ambulatory Visit (INDEPENDENT_AMBULATORY_CARE_PROVIDER_SITE_OTHER): Payer: Medicare Other

## 2019-05-28 ENCOUNTER — Other Ambulatory Visit: Payer: Self-pay

## 2019-05-28 ENCOUNTER — Ambulatory Visit: Payer: Medicare Other | Admitting: Cardiovascular Disease

## 2019-05-28 VITALS — BP 130/72 | HR 69 | Ht 71.05 in | Wt 168.8 lb

## 2019-05-28 DIAGNOSIS — E782 Mixed hyperlipidemia: Secondary | ICD-10-CM

## 2019-05-28 DIAGNOSIS — Z952 Presence of prosthetic heart valve: Secondary | ICD-10-CM | POA: Diagnosis not present

## 2019-05-28 DIAGNOSIS — Z5181 Encounter for therapeutic drug level monitoring: Secondary | ICD-10-CM

## 2019-05-28 LAB — POCT INR: INR: 3.2 — AB (ref 2.0–3.0)

## 2019-05-28 NOTE — Patient Instructions (Signed)
Description   Continue on same dosage 1 tablet daily except 1/2 tablet on Mondays and Fridays.  Recheck INR in 4 weeks.  Call [phone] with new medications.

## 2019-05-28 NOTE — Patient Instructions (Signed)

## 2019-05-28 NOTE — Progress Notes (Signed)
Terry [name]  Date of Birth  1935-07-19     Problem list: 1. Mitral valve replacement- mitral valve repair while in [place] and had mitral valve replacement here with Dr. Servando Snare 2003 2. Hyperlipidemia    Notes from 2013:   Terry [name] is a day 84 year old gentleman with a history of mitral valve replacement. He has done very well. His INR Levels have been therapeutic. He has not had any problems.  He is doing very well.  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   Oct. 24, 2014:  Terry [name] has done well.  Still volunteering at the hospital.   No CP , no dyspnea.   INR levels are ok.    November 19, 2013:  Terry [name] is doing ok but has been having more palpitations associated with a very brief episode of lightheadedness. He's not had any episodes of syncope. These episodes seem to be more common in the morning and can occur when he is walking down the hall. He also has an occasionally at night when he is relaxing.  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 He will walks for exercise everyday.   02/21/2015.  Doing well. Still working  at the hospital .  [employer] [facility] 5 days a week - 3 miles  No CP or dyspnea.  No further episodes of lightheadedness.   Oct. 20, 2017:   Doing well Still working at [employer]  INR levels are great.   Jan. 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  Tries to walk every day  No CP or dyspnea.  INR is theraputic.   May 28, 2018: Terry [name] is seen today for follow-up of his mitral valve replacement.  He is done well.  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  Walks daily  INR levels have been therapeutic.  He denies any bleeding.  He does have occasional bruises.  May 28, 2019:  Terry [name] is seen for follow-up of his mitral valve replacement.  He is on Coumadin.  His INR levels have been therapeutic.  He has a  history of hyperlipidemia and is on rosuvastatin. Has had his first covid injection  Still very active .   Is on hiatus from volunteering at the hospital     Current Outpatient Medications on File Prior to Visit  Medication Sig Dispense Refill  . aspirin 81 MG tablet Take 81 mg by mouth daily.    . Calcium Carbonate-Vitamin D (CALCIUM + D PO) Take 600 mg by mouth daily.    . Cholecalciferol (VITAMIN D3) 1000 UNITS tablet Take 1,000 Units by mouth daily.      . COSOPT PF 22.3-6.8 MG/ML SOLN ophthalmic solution APPLY 1 DROP INTO BOTH EYES TWICE A DAY  3  . CRESTOR 20 MG tablet Take 1 tablet by mouth Daily.    . metoprolol succinate (TOPROL-XL) 100 MG 24 hr tablet TAKE 1 AND 1/2 TABLETS BY MOUTH DAILY. Please keep upcoming appt in January with Dr. Acie Fredrickson for future refills. Thank you 135 tablet 0  . multivitamin (THERAGRAN) per tablet Take 1 tablet by mouth daily.      Marland Kitchen omeprazole (PRILOSEC OTC) 20 MG tablet Take 20 mg by mouth every other day.      . propranolol (INDERAL) 10 MG tablet Take 10 mg by mouth as  needed.      . triamcinolone cream (KENALOG) 0.1 % Apply 1 application topically as needed.    . warfarin (COUMADIN) 5 MG tablet TAKE AS DIRECTED BY ANTICOAGULATION CLINIC. 90 tablet 1  . ZIOPTAN 0.0015 % SOLN APPLY 1 DROP INTO BOTH EYES AT BEDTIME  3   No current facility-administered medications on file prior to visit.    Allergies  Allergen Reactions  . Ciprofloxacin     Heartburn  . Sulfa Drugs Cross Reactors     Past Medical History:  Diagnosis Date  . Benign prostatic hypertrophy   . Dyslipidemia   . Hyperlipidemia   . Long-term (current) use of anticoagulants   . Mitral valve prolapse   . Osteoporosis   . Ventricular tachycardia [facility])     Past Surgical History:  Procedure Laterality Date  . APPENDICO-VESICOSTOMY CUTANEOUS    . CARDIAC CATHETERIZATION  08/19/2001   Essentially smooth and normal coronary arteries  . CORONARY STENT PLACEMENT  11/03/2001   Mitral  valve replacement with #35 St. Jude mechanical mitral valve prosthesis and closure of  left atrial appendage    Social History   Tobacco Use  Smoking Status Former Smoker  . Quit date: 05/13/1964  . Years since quitting: 55.0  Smokeless Tobacco Never Used    Social History   Substance and Sexual Activity  Alcohol Use No    History reviewed. No pertinent family history.  Reviw of Systems:  Noted in current history.  All other systems are negative.  Physical Exam: Blood pressure 130/72, pulse 69, height 5' 11.05" (1.805 m), weight 168 lb 12.8 oz (76.6 kg), SpO2 98 %.  GEN:  Well nourished, well developed in no acute distress HEENT: Normal NECK: No JVD; No carotid bruits LYMPHATICS: No lymphadenopathy CARDIAC: RRR , mechanical S1  RESPIRATORY:  Clear to auscultation without rales, wheezing or rhonchi  ABDOMEN: Soft, non-tender, non-distended MUSCULOSKELETAL:  No edema; No deformity  SKIN: Warm and dry NEUROLOGIC:  Alert and oriented x 3   EKG:  May 28, 2019: Normal sinus rhythm at 69 beats a minute.  No ST or T wave abnormalities.  Assessment / Plan:   1. Mitral valve replacement- mitral valve repair while in [place] and had mitral valve replacement here with Dr. Servando Snare 2003 Valve sound great.  INR is theraputic.  HR and BP are good    2. Hyperlipidemia -  cont crestor   labs look good    Mertie Moores, MD  05/28/2019 11:28 AM    [facility] [place],  [place], [place]  [address] Pager [phone] Phone: [phone]; Fax: [phone]

## 2019-06-25 ENCOUNTER — Other Ambulatory Visit: Payer: Self-pay

## 2019-06-25 ENCOUNTER — Ambulatory Visit (INDEPENDENT_AMBULATORY_CARE_PROVIDER_SITE_OTHER): Payer: Medicare Other

## 2019-06-25 DIAGNOSIS — Z5181 Encounter for therapeutic drug level monitoring: Secondary | ICD-10-CM | POA: Diagnosis not present

## 2019-06-25 DIAGNOSIS — Z952 Presence of prosthetic heart valve: Secondary | ICD-10-CM | POA: Diagnosis not present

## 2019-06-25 LAB — POCT INR: INR: 2.7 (ref 2.0–3.0)

## 2019-06-25 NOTE — Patient Instructions (Signed)
Description   Continue on same dosage 1 tablet daily except 1/2 tablet on Mondays and Fridays.  Recheck INR in 5 weeks.  Call [phone] with new medications.

## 2019-07-07 ENCOUNTER — Telehealth: Payer: Self-pay | Admitting: *Deleted

## 2019-07-07 ENCOUNTER — Telehealth: Payer: Self-pay | Admitting: Cardiovascular Disease

## 2019-07-07 DIAGNOSIS — R002 Palpitations: Secondary | ICD-10-CM

## 2019-07-07 NOTE — Telephone Encounter (Signed)
Patient notified.  I told him our monitor department would be in touch with him.

## 2019-07-07 NOTE — Telephone Encounter (Signed)
Lets place a 2 week event monitor

## 2019-07-07 NOTE — Telephone Encounter (Signed)
Patient enrolled for [employer] to mail a 14 day ZIO AT long term monitor-Live Telemetry to his home.  Instructions reviewed briefly and sent to patient via My Chart message.

## 2019-07-07 NOTE — Telephone Encounter (Signed)
I spoke with patient who reports he has been having more skipped beats over the last 2 weeks.  He states he has PVC's but this feels different. He will feel skipped beats and then a pounding feeling. Notices more when he is quiet. Occasional brief episodes of feeling lightheaded but otherwise is feeling OK.

## 2019-07-07 NOTE — Telephone Encounter (Signed)
New Message  Patient c/o Palpitations:  High priority if patient c/o lightheadedness, shortness of breath, or chest pain  1) How long have you had palpitations/irregular HR/ Afib? Are you having the symptoms now? A couple weeks ago  2) Are you currently experiencing lightheadedness, SOB or CP? No  3) Do you have a history of afib (atrial fibrillation) or irregular heart rhythm? Heart skipping beats  4) Have you checked your BP or HR? (document readings if available): No  5) Are you experiencing any other symptoms? heart pauses and tries to catch up

## 2019-07-09 ENCOUNTER — Encounter (INDEPENDENT_AMBULATORY_CARE_PROVIDER_SITE_OTHER): Payer: Medicare Other

## 2019-07-09 DIAGNOSIS — R002 Palpitations: Secondary | ICD-10-CM | POA: Diagnosis not present

## 2019-07-10 ENCOUNTER — Telehealth: Payer: Self-pay | Admitting: Cardiology

## 2019-07-10 NOTE — Telephone Encounter (Signed)
Pt is wearing monitor - his HR by his checking is 40 - he has no chest pain or SOB, he does on occ have mild dizziness.  Discussed we could decrease BB to one pill per day but for now he prefers to continue current dose.  If symptoms increase he will call.  He may not be picking up premature beats with checking his pulse.   Will notify Dr. Acie Fredrickson

## 2019-07-12 NOTE — Telephone Encounter (Signed)
These low HR readings by pulse may be PVCs that are not felt.   Will review the monitor when it is available  Cont current meds

## 2019-07-30 ENCOUNTER — Ambulatory Visit: Payer: Medicare Other | Admitting: *Deleted

## 2019-07-30 ENCOUNTER — Other Ambulatory Visit: Payer: Self-pay

## 2019-07-30 DIAGNOSIS — Z5181 Encounter for therapeutic drug level monitoring: Secondary | ICD-10-CM

## 2019-07-30 DIAGNOSIS — Z952 Presence of prosthetic heart valve: Secondary | ICD-10-CM | POA: Diagnosis not present

## 2019-07-30 LAB — POCT INR: INR: 3.3 — AB (ref 2.0–3.0)

## 2019-07-30 NOTE — Patient Instructions (Signed)
Description   Continue on same dosage 1 tablet daily except 1/2 tablet on Mondays and Fridays.  Recheck INR in 6 weeks.  Call [phone] with new medications.    

## 2019-08-15 ENCOUNTER — Other Ambulatory Visit: Payer: Self-pay | Admitting: Cardiovascular Disease

## 2019-08-29 ENCOUNTER — Other Ambulatory Visit: Payer: Self-pay | Admitting: Cardiovascular Disease

## 2019-09-10 ENCOUNTER — Ambulatory Visit: Payer: Medicare Other | Admitting: *Deleted

## 2019-09-10 ENCOUNTER — Other Ambulatory Visit: Payer: Self-pay

## 2019-09-10 DIAGNOSIS — Z5181 Encounter for therapeutic drug level monitoring: Secondary | ICD-10-CM

## 2019-09-10 DIAGNOSIS — Z952 Presence of prosthetic heart valve: Secondary | ICD-10-CM

## 2019-09-10 LAB — POCT INR: INR: 2.6 (ref 2.0–3.0)

## 2019-09-10 NOTE — Patient Instructions (Signed)
Description   Continue on same dosage 1 tablet daily except 1/2 tablet on Mondays and Fridays.  Recheck INR in 6 weeks.  Call [phone] with new medications.    

## 2019-10-22 ENCOUNTER — Ambulatory Visit (INDEPENDENT_AMBULATORY_CARE_PROVIDER_SITE_OTHER): Payer: Medicare Other | Admitting: *Deleted

## 2019-10-22 ENCOUNTER — Other Ambulatory Visit: Payer: Self-pay

## 2019-10-22 DIAGNOSIS — Z952 Presence of prosthetic heart valve: Secondary | ICD-10-CM | POA: Diagnosis not present

## 2019-10-22 DIAGNOSIS — Z5181 Encounter for therapeutic drug level monitoring: Secondary | ICD-10-CM

## 2019-10-22 LAB — POCT INR: INR: 3.3 — AB (ref 2.0–3.0)

## 2019-10-22 NOTE — Patient Instructions (Signed)
Description   Continue on same dosage 1 tablet daily except 1/2 tablet on Mondays and Fridays.  Recheck INR in 6 weeks.  Call [phone] with new medications.

## 2019-12-03 ENCOUNTER — Other Ambulatory Visit: Payer: Self-pay

## 2019-12-03 ENCOUNTER — Ambulatory Visit: Payer: Medicare Other

## 2019-12-03 DIAGNOSIS — Z5181 Encounter for therapeutic drug level monitoring: Secondary | ICD-10-CM | POA: Diagnosis not present

## 2019-12-03 DIAGNOSIS — Z952 Presence of prosthetic heart valve: Secondary | ICD-10-CM | POA: Diagnosis not present

## 2019-12-03 LAB — POCT INR: INR: 2.9 (ref 2.0–3.0)

## 2019-12-03 NOTE — Patient Instructions (Signed)
Continue on same dosage 1 tablet daily except 1/2 tablet on Mondays and Fridays.  Recheck INR in 6 weeks.  Call [phone] with new medications.

## 2020-01-14 ENCOUNTER — Other Ambulatory Visit: Payer: Self-pay

## 2020-01-14 ENCOUNTER — Ambulatory Visit: Payer: Medicare Other | Admitting: *Deleted

## 2020-01-14 DIAGNOSIS — Z5181 Encounter for therapeutic drug level monitoring: Secondary | ICD-10-CM | POA: Diagnosis not present

## 2020-01-14 DIAGNOSIS — Z952 Presence of prosthetic heart valve: Secondary | ICD-10-CM | POA: Diagnosis not present

## 2020-01-14 LAB — POCT INR: INR: 3.8 — AB (ref 2.0–3.0)

## 2020-01-14 NOTE — Patient Instructions (Signed)
Description   Do not take any Warfarin tomorrow, then continue on same dosage 1 tablet daily except 1/2 tablet on Mondays and Fridays. Recheck INR in 4 weeks. Call [phone] with new medications.

## 2020-02-11 ENCOUNTER — Ambulatory Visit (INDEPENDENT_AMBULATORY_CARE_PROVIDER_SITE_OTHER): Payer: Medicare Other | Admitting: *Deleted

## 2020-02-11 ENCOUNTER — Other Ambulatory Visit: Payer: Self-pay

## 2020-02-11 DIAGNOSIS — Z5181 Encounter for therapeutic drug level monitoring: Secondary | ICD-10-CM

## 2020-02-11 DIAGNOSIS — Z952 Presence of prosthetic heart valve: Secondary | ICD-10-CM | POA: Diagnosis not present

## 2020-02-11 LAB — POCT INR: INR: 3.1 — AB (ref 2.0–3.0)

## 2020-02-11 NOTE — Patient Instructions (Signed)
Description   Continue taking 1 tablet daily except 1/2 tablet on Mondays and Fridays. Recheck INR in 5 weeks. Call [phone] with new medications.

## 2020-02-21 ENCOUNTER — Ambulatory Visit: Payer: Medicare Other | Admitting: Podiatry

## 2020-02-21 ENCOUNTER — Ambulatory Visit (INDEPENDENT_AMBULATORY_CARE_PROVIDER_SITE_OTHER): Payer: Medicare Other

## 2020-02-21 ENCOUNTER — Other Ambulatory Visit: Payer: Self-pay

## 2020-02-21 ENCOUNTER — Encounter: Payer: Self-pay | Admitting: Podiatry

## 2020-02-21 DIAGNOSIS — M21619 Bunion of unspecified foot: Secondary | ICD-10-CM

## 2020-02-21 DIAGNOSIS — M2041 Other hammer toe(s) (acquired), right foot: Secondary | ICD-10-CM

## 2020-02-21 DIAGNOSIS — R52 Pain, unspecified: Secondary | ICD-10-CM | POA: Diagnosis not present

## 2020-02-21 DIAGNOSIS — L84 Corns and callosities: Secondary | ICD-10-CM

## 2020-02-21 DIAGNOSIS — M2042 Other hammer toe(s) (acquired), left foot: Secondary | ICD-10-CM

## 2020-02-23 NOTE — Progress Notes (Signed)
Subjective:   Patient ID: Terry [name], male   DOB: 84 y.o.   MRN: [id number]   HPI Patient presents with severe pain in the second digit right which came on all of a sudden over the last month.  States it is in the toe and then the metatarsal has gradually become more of an issue over that time.  Patient states he does not remember any trauma or other injury and does not smoke likes to be active   Review of Systems  All other systems reviewed and are negative.       Objective:  Physical Exam Vitals and nursing note reviewed.  Constitutional:      Appearance: He is well-developed.  Pulmonary:     Effort: Pulmonary effort is normal.  Musculoskeletal:        General: Normal range of motion.  Skin:    General: Skin is warm.  Neurological:     Mental Status: He is alert.     Neurovascular status found to be intact muscle strength adequate range of motion within normal limits with patient noted to have severe plantar callus on the second digit right that he cannot see.  It is localized I did not note any drainage associated with it and there is mild to moderate pain within the metatarsal phalangeal joint but not to the same degree.  Has good digital perfusion well oriented x3     Assessment:  Strong probability for digital deformity with possibility for underlying capsulitis stress fracture     Plan:  H&P all conditions reviewed and today sterile sharp debridement of lesion accomplished no iatrogenic bleeding applied padding discussed the possibility for digital procedure or treatment of the underlying metatarsal if symptoms persist.  Reappoint if any issues were to occur and I educated him on deformity and different possible other solutions  X-rays indicate that there is no signs of osteolysis with digital deformity noted

## 2020-03-13 ENCOUNTER — Other Ambulatory Visit: Payer: Self-pay | Admitting: Cardiovascular Disease

## 2020-03-17 ENCOUNTER — Ambulatory Visit (INDEPENDENT_AMBULATORY_CARE_PROVIDER_SITE_OTHER): Payer: Medicare Other | Admitting: *Deleted

## 2020-03-17 ENCOUNTER — Other Ambulatory Visit: Payer: Self-pay

## 2020-03-17 DIAGNOSIS — Z5181 Encounter for therapeutic drug level monitoring: Secondary | ICD-10-CM | POA: Diagnosis not present

## 2020-03-17 DIAGNOSIS — Z952 Presence of prosthetic heart valve: Secondary | ICD-10-CM

## 2020-03-17 LAB — POCT INR: INR: 3.7 — AB (ref 2.0–3.0)

## 2020-03-17 NOTE — Patient Instructions (Addendum)
Description   Hold tomorrow, then continue taking 1 tablet daily except 1/2 tablet on Mondays and Fridays. Recheck INR in 3 weeks. Call [phone] with new medications.

## 2020-04-04 ENCOUNTER — Other Ambulatory Visit: Payer: Self-pay

## 2020-04-04 ENCOUNTER — Ambulatory Visit: Payer: Medicare Other | Admitting: *Deleted

## 2020-04-04 DIAGNOSIS — Z5181 Encounter for therapeutic drug level monitoring: Secondary | ICD-10-CM | POA: Diagnosis not present

## 2020-04-04 DIAGNOSIS — Z952 Presence of prosthetic heart valve: Secondary | ICD-10-CM

## 2020-04-04 LAB — POCT INR: INR: 3 (ref 2.0–3.0)

## 2020-04-04 NOTE — Patient Instructions (Signed)
Description   Continue taking 1 tablet daily except 1/2 tablet on Mondays and Fridays. Recheck INR in 4 weeks. Coumadin Clinic [phone]

## 2020-05-02 ENCOUNTER — Ambulatory Visit (INDEPENDENT_AMBULATORY_CARE_PROVIDER_SITE_OTHER): Payer: Medicare Other | Admitting: Pharmacist

## 2020-05-02 ENCOUNTER — Other Ambulatory Visit: Payer: Self-pay

## 2020-05-02 DIAGNOSIS — Z952 Presence of prosthetic heart valve: Secondary | ICD-10-CM | POA: Diagnosis not present

## 2020-05-02 DIAGNOSIS — Z5181 Encounter for therapeutic drug level monitoring: Secondary | ICD-10-CM | POA: Diagnosis not present

## 2020-05-02 LAB — POCT INR: INR: 2.6 (ref 2.0–3.0)

## 2020-05-09 ENCOUNTER — Other Ambulatory Visit: Payer: Self-pay | Admitting: Cardiovascular Disease

## 2020-06-02 ENCOUNTER — Ambulatory Visit: Payer: Medicare Other | Admitting: Cardiovascular Disease

## 2020-06-02 ENCOUNTER — Encounter: Payer: Self-pay | Admitting: Cardiovascular Disease

## 2020-06-02 ENCOUNTER — Ambulatory Visit (INDEPENDENT_AMBULATORY_CARE_PROVIDER_SITE_OTHER): Payer: Medicare Other | Admitting: *Deleted

## 2020-06-02 ENCOUNTER — Other Ambulatory Visit: Payer: Self-pay

## 2020-06-02 VITALS — BP 114/66 | HR 74 | Ht 71.0 in | Wt 168.0 lb

## 2020-06-02 DIAGNOSIS — Z7901 Long term (current) use of anticoagulants: Secondary | ICD-10-CM | POA: Diagnosis not present

## 2020-06-02 DIAGNOSIS — Z952 Presence of prosthetic heart valve: Secondary | ICD-10-CM | POA: Diagnosis not present

## 2020-06-02 DIAGNOSIS — Z5181 Encounter for therapeutic drug level monitoring: Secondary | ICD-10-CM

## 2020-06-02 LAB — POCT INR: INR: 4.1 — AB (ref 2.0–3.0)

## 2020-06-02 NOTE — Patient Instructions (Signed)
Medication Instructions:  The current medical regimen is effective;  continue present plan and medications.  *If you need a refill on your cardiac medications before your next appointment, please call your pharmacy*  Follow-Up: At [facility], you and your health needs are our priority.  As part of our continuing mission to provide you with exceptional heart care, we have created designated Provider Care Teams.  These Care Teams include your primary Cardiologist (physician) and Advanced Practice Providers (APPs -  Physician Assistants and Nurse Practitioners) who all work together to provide you with the care you need, when you need it.  We recommend signing up for the patient portal called "MyChart".  Sign up information is provided on this After Visit Summary.  MyChart is used to connect with patients for Virtual Visits (Telemedicine).  Patients are able to view lab/test results, encounter notes, upcoming appointments, etc.  Non-urgent messages can be sent to your provider as well.   To learn more about what you can do with MyChart, go to [email or web address].    Your next appointment:   12 month(s)  The format for your next appointment:   In Person  Provider:   Philip Nahser, MD  Thank you for choosing [facility]!!     

## 2020-06-02 NOTE — Progress Notes (Signed)
Terry [name]  Date of Birth  Jun 20, 1935     Problem list: 1. Mitral valve replacement- mitral valve repair while in [place] and had mitral valve replacement here with Dr. Servando Snare 2003 2. Hyperlipidemia    Notes from 2013:   Terry [name] is a day 85 year old gentleman with a history of mitral valve replacement. He has done very well. His INR Levels have been therapeutic. He has not had any problems.  He is doing very well.  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   Oct. 24, 2014:  Terry [name] has done well.  Still volunteering at the hospital.   No CP , no dyspnea.   INR levels are ok.    November 19, 2013:  Terry [name] is doing ok but has been having more palpitations associated with a very brief episode of lightheadedness. He's not had any episodes of syncope. These episodes seem to be more common in the morning and can occur when he is walking down the hall. He also has an occasionally at night when he is relaxing.  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 He will walks for exercise everyday.   02/21/2015.  Doing well. Still working  at the hospital .  [facility] 5 days a week - 3 miles  No CP or dyspnea.  No further episodes of lightheadedness.   Oct. 20, 2017:   Doing well Still working at [employer]  INR levels are great.   Jan. 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  Tries to walk every day  No CP or dyspnea.  INR is theraputic.   May 28, 2018: Terry [name] is seen today for follow-up of his mitral valve replacement.  He is done well.  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  Walks daily  INR levels have been therapeutic.  He denies any bleeding.  He does have occasional bruises.  May 28, 2019:  Terry [name] is seen for follow-up of his mitral valve replacement.  He is on Coumadin.  His INR levels have been therapeutic.  He has a  history of hyperlipidemia and is on rosuvastatin. Has had his first covid injection  Still very active .   Is on hiatus from volunteering at the hospital   Jan. 21, 2022  Terry [name] is doing ok today  INR is 4.1 today  Still volunteering at [facility] .   Current Outpatient Medications on File Prior to Visit  Medication Sig Dispense Refill  . aspirin 81 MG tablet Take 81 mg by mouth daily.    . Calcium Carbonate-Vitamin D (CALCIUM + D PO) Take 600 mg by mouth daily.    . Cholecalciferol (VITAMIN D3) 1000 UNITS tablet Take 1,000 Units by mouth daily.    . COSOPT PF 22.3-6.8 MG/ML SOLN ophthalmic solution APPLY 1 DROP INTO BOTH EYES TWICE A DAY  3  . CRESTOR 20 MG tablet Take 1 tablet by mouth Daily.    . metoprolol succinate (TOPROL-XL) 100 MG 24 hr tablet TAKE 1 & 1/2 TABLETS BY MOUTH DAILY 135 tablet 2  . multivitamin (THERAGRAN) per tablet Take 1 tablet by mouth daily.    Marland Kitchen omeprazole (PRILOSEC OTC) 20 MG tablet Take 20 mg by mouth every other day.    . propranolol (INDERAL) 10 MG tablet Take 10 mg by  mouth as needed.    . warfarin (COUMADIN) 5 MG tablet TAKE AS DIRECTED BY ANTICOAGULATION CLINIC. 90 tablet 1  . ZIOPTAN 0.0015 % SOLN APPLY 1 DROP INTO BOTH EYES AT BEDTIME  3   No current facility-administered medications on file prior to visit.    Allergies  Allergen Reactions  . Ciprofloxacin     Heartburn  . Sulfa Drugs Cross Reactors     Past Medical History:  Diagnosis Date  . Benign prostatic hypertrophy   . Dyslipidemia   . Hyperlipidemia   . Long-term (current) use of anticoagulants   . Mitral valve prolapse   . Osteoporosis   . Ventricular tachycardia [facility])     Past Surgical History:  Procedure Laterality Date  . APPENDICO-VESICOSTOMY CUTANEOUS    . CARDIAC CATHETERIZATION  08/19/2001   Essentially smooth and normal coronary arteries  . CORONARY STENT PLACEMENT  11/03/2001   Mitral valve replacement with #35 St. Jude mechanical mitral valve prosthesis  and closure of  left atrial appendage    Social History   Tobacco Use  Smoking Status Former Smoker  . Quit date: 05/13/1964  . Years since quitting: 56.0  Smokeless Tobacco Never Used    Social History   Substance and Sexual Activity  Alcohol Use No    History reviewed. No pertinent family history.  Reviw of Systems:  Noted in current history.  All other systems are negative. Physical Exam: Blood pressure 114/66, pulse 74, height 5\' 11"  (1.803 m), weight 168 lb (76.2 kg), SpO2 97 %.  GEN:  Well nourished, well developed in no acute distress HEENT: Normal NECK: No JVD; No carotid bruits LYMPHATICS: No lymphadenopathy CARDIAC: RRR, mechanical S1.   Soft systolic murur  RESPIRATORY:  Clear to auscultation without rales, wheezing or rhonchi  ABDOMEN: Soft, non-tender, non-distended MUSCULOSKELETAL:  No edema; No deformity  SKIN: Warm and dry NEUROLOGIC:  Alert and oriented x 3   EKG:  Jan. 21, 2022:   NSR at 74.   No ST or T wave abn   Assessment / Plan:   1. Mitral valve replacement- mitral valve repair while in [place] and had mitral valve replacement here with Dr. Servando Snare 2003 Valve sounds great.   Cont coumadin , Coumadin is managed by our coumadin clinic   2. Hyperlipidemia -  cont crestor   labs look good    Mertie Moores, MD  06/02/2020 1:31 PM    [facility] [address],  [place],   [address] Pager [phone] Phone: [phone]; Fax: [phone]

## 2020-06-02 NOTE — Patient Instructions (Signed)
Description   Do not take any Warfarin tomorrow then continue taking 1 tablet daily except 1/2 tablet on Mondays and Fridays. Recheck INR in 3 weeks. Coumadin Clinic [phone]

## 2020-06-23 ENCOUNTER — Ambulatory Visit (INDEPENDENT_AMBULATORY_CARE_PROVIDER_SITE_OTHER): Payer: Medicare Other | Admitting: Pharmacist

## 2020-06-23 ENCOUNTER — Other Ambulatory Visit: Payer: Self-pay

## 2020-06-23 DIAGNOSIS — Z952 Presence of prosthetic heart valve: Secondary | ICD-10-CM

## 2020-06-23 DIAGNOSIS — Z5181 Encounter for therapeutic drug level monitoring: Secondary | ICD-10-CM

## 2020-06-23 LAB — POCT INR: INR: 3.7 — AB (ref 2.0–3.0)

## 2020-06-23 NOTE — Patient Instructions (Signed)
Description   Do not take any Warfarin tomorrow then start taking 1 tablet daily except 1/2 tablet on Mondays, Wednesdays, and Fridays. Recheck INR in 3 weeks. Coumadin Clinic [phone]

## 2020-07-14 ENCOUNTER — Ambulatory Visit (INDEPENDENT_AMBULATORY_CARE_PROVIDER_SITE_OTHER): Payer: Medicare Other | Admitting: *Deleted

## 2020-07-14 ENCOUNTER — Other Ambulatory Visit: Payer: Self-pay

## 2020-07-14 DIAGNOSIS — Z5181 Encounter for therapeutic drug level monitoring: Secondary | ICD-10-CM | POA: Diagnosis not present

## 2020-07-14 DIAGNOSIS — Z952 Presence of prosthetic heart valve: Secondary | ICD-10-CM

## 2020-07-14 LAB — POCT INR: INR: 2.3 (ref 2.0–3.0)

## 2020-07-14 NOTE — Patient Instructions (Signed)
Description   Today take 1 tablet then continue taking 1 tablet daily except 1/2 tablet on Mondays, Wednesdays, and Fridays. Recheck INR in 3 weeks. Coumadin Clinic [phone]

## 2020-08-04 ENCOUNTER — Other Ambulatory Visit: Payer: Self-pay

## 2020-08-04 ENCOUNTER — Ambulatory Visit (INDEPENDENT_AMBULATORY_CARE_PROVIDER_SITE_OTHER): Payer: Medicare Other

## 2020-08-04 DIAGNOSIS — Z952 Presence of prosthetic heart valve: Secondary | ICD-10-CM | POA: Diagnosis not present

## 2020-08-04 DIAGNOSIS — Z5181 Encounter for therapeutic drug level monitoring: Secondary | ICD-10-CM | POA: Diagnosis not present

## 2020-08-04 LAB — POCT INR: INR: 2.6 (ref 2.0–3.0)

## 2020-08-04 NOTE — Patient Instructions (Signed)
Description   Continue on same dosage 1 tablet daily except 1/2 tablet on Mondays, Wednesdays, and Fridays. Recheck INR in 4 weeks. Coumadin Clinic [phone]

## 2020-09-01 ENCOUNTER — Other Ambulatory Visit: Payer: Self-pay

## 2020-09-01 ENCOUNTER — Ambulatory Visit (INDEPENDENT_AMBULATORY_CARE_PROVIDER_SITE_OTHER): Payer: Medicare Other | Admitting: Pharmacist

## 2020-09-01 DIAGNOSIS — Z7901 Long term (current) use of anticoagulants: Secondary | ICD-10-CM | POA: Diagnosis not present

## 2020-09-01 DIAGNOSIS — Z5181 Encounter for therapeutic drug level monitoring: Secondary | ICD-10-CM

## 2020-09-01 DIAGNOSIS — Z952 Presence of prosthetic heart valve: Secondary | ICD-10-CM

## 2020-09-01 LAB — POCT INR: INR: 3 (ref 2.0–3.0)

## 2020-09-01 NOTE — Patient Instructions (Signed)
Description   Continue on same dosage 1 tablet daily except 1/2 tablet on Mondays, Wednesdays, and Fridays. Recheck INR in 5 weeks. Coumadin Clinic [phone]

## 2020-09-25 ENCOUNTER — Other Ambulatory Visit: Payer: Self-pay | Admitting: Cardiovascular Disease

## 2020-10-06 ENCOUNTER — Ambulatory Visit (INDEPENDENT_AMBULATORY_CARE_PROVIDER_SITE_OTHER): Payer: Medicare Other | Admitting: *Deleted

## 2020-10-06 ENCOUNTER — Other Ambulatory Visit: Payer: Self-pay

## 2020-10-06 DIAGNOSIS — Z952 Presence of prosthetic heart valve: Secondary | ICD-10-CM

## 2020-10-06 DIAGNOSIS — Z5181 Encounter for therapeutic drug level monitoring: Secondary | ICD-10-CM | POA: Diagnosis not present

## 2020-10-06 LAB — POCT INR: INR: 2.6 (ref 2.0–3.0)

## 2020-10-06 NOTE — Patient Instructions (Signed)
Description   Continue taking Warfarin 1 tablet daily except 1/2 tablet on Mondays, Wednesdays, and Fridays. Recheck INR in 6 weeks. Coumadin Clinic [phone]

## 2020-11-17 ENCOUNTER — Other Ambulatory Visit: Payer: Self-pay

## 2020-11-17 ENCOUNTER — Ambulatory Visit: Payer: Medicare Other | Admitting: *Deleted

## 2020-11-17 DIAGNOSIS — Z952 Presence of prosthetic heart valve: Secondary | ICD-10-CM | POA: Diagnosis not present

## 2020-11-17 DIAGNOSIS — Z5181 Encounter for therapeutic drug level monitoring: Secondary | ICD-10-CM | POA: Diagnosis not present

## 2020-11-17 LAB — POCT INR: INR: 3.1 — AB (ref 2.0–3.0)

## 2020-11-17 NOTE — Patient Instructions (Signed)
Description   Continue taking Warfarin 1 tablet daily except 1/2 tablet on Mondays, Wednesdays, and Fridays. Recheck INR in 6 weeks. Coumadin Clinic [phone]

## 2020-12-29 ENCOUNTER — Other Ambulatory Visit: Payer: Self-pay

## 2020-12-29 ENCOUNTER — Ambulatory Visit: Payer: Medicare Other | Admitting: *Deleted

## 2020-12-29 DIAGNOSIS — Z952 Presence of prosthetic heart valve: Secondary | ICD-10-CM | POA: Diagnosis not present

## 2020-12-29 DIAGNOSIS — Z5181 Encounter for therapeutic drug level monitoring: Secondary | ICD-10-CM | POA: Diagnosis not present

## 2020-12-29 LAB — POCT INR: INR: 1.9 — AB (ref 2.0–3.0)

## 2020-12-29 NOTE — Patient Instructions (Signed)
Description   Today take another 1/2 tablet (already taken today's dose) and tomorrow take 1.5 tablets then continue taking Warfarin 1 tablet daily except 1/2 tablet on Mondays, Wednesdays, and Fridays. Recheck INR in 2 weeks (normally 6 weeks). Coumadin Clinic [phone]

## 2021-01-12 ENCOUNTER — Ambulatory Visit: Payer: Medicare Other | Admitting: *Deleted

## 2021-01-12 ENCOUNTER — Other Ambulatory Visit: Payer: Self-pay

## 2021-01-12 DIAGNOSIS — Z952 Presence of prosthetic heart valve: Secondary | ICD-10-CM | POA: Diagnosis not present

## 2021-01-12 DIAGNOSIS — Z5181 Encounter for therapeutic drug level monitoring: Secondary | ICD-10-CM | POA: Diagnosis not present

## 2021-01-12 LAB — POCT INR: INR: 2.9 (ref 2.0–3.0)

## 2021-01-12 NOTE — Patient Instructions (Signed)
Description   Continue taking Warfarin 1 tablet daily except 1/2 tablet on Mondays, Wednesdays, and Fridays. Recheck INR in 4 weeks (normally 6 weeks). Coumadin Clinic [phone]

## 2021-02-09 ENCOUNTER — Other Ambulatory Visit: Payer: Self-pay

## 2021-02-09 ENCOUNTER — Ambulatory Visit: Payer: Medicare Other | Admitting: *Deleted

## 2021-02-09 DIAGNOSIS — Z952 Presence of prosthetic heart valve: Secondary | ICD-10-CM

## 2021-02-09 DIAGNOSIS — Z5181 Encounter for therapeutic drug level monitoring: Secondary | ICD-10-CM

## 2021-02-09 LAB — POCT INR: INR: 2.8 (ref 2.0–3.0)

## 2021-02-09 NOTE — Patient Instructions (Signed)
Description   Continue taking Warfarin 1 tablet daily except 1/2 tablet on Mondays, Wednesdays, and Fridays. Recheck INR in 6 weeks. Coumadin Clinic [phone]

## 2021-03-05 ENCOUNTER — Other Ambulatory Visit: Payer: Self-pay | Admitting: Cardiovascular Disease

## 2021-03-23 ENCOUNTER — Ambulatory Visit: Payer: Medicare Other | Admitting: *Deleted

## 2021-03-23 ENCOUNTER — Other Ambulatory Visit: Payer: Self-pay

## 2021-03-23 DIAGNOSIS — Z952 Presence of prosthetic heart valve: Secondary | ICD-10-CM

## 2021-03-23 DIAGNOSIS — Z5181 Encounter for therapeutic drug level monitoring: Secondary | ICD-10-CM | POA: Diagnosis not present

## 2021-03-23 LAB — POCT INR: INR: 2.5 (ref 2.0–3.0)

## 2021-03-23 NOTE — Patient Instructions (Signed)
Description   Today take another 1/2 tablet then continue taking Warfarin 1 tablet daily except 1/2 tablet on Mondays, Wednesdays, and Fridays. Recheck INR in 6 weeks. Coumadin Clinic [phone]

## 2021-05-01 ENCOUNTER — Other Ambulatory Visit: Payer: Self-pay

## 2021-05-01 ENCOUNTER — Ambulatory Visit: Payer: Medicare Other | Admitting: Pharmacist

## 2021-05-01 DIAGNOSIS — Z5181 Encounter for therapeutic drug level monitoring: Secondary | ICD-10-CM

## 2021-05-01 DIAGNOSIS — Z952 Presence of prosthetic heart valve: Secondary | ICD-10-CM

## 2021-05-01 LAB — POCT INR: INR: 2.3 (ref 2.0–3.0)

## 2021-05-01 NOTE — Patient Instructions (Signed)
Today take another 1/2 tablet today then start taking Warfarin 1 tablet daily except 1/2 tablet on Mondays and Fridays. Recheck INR in 3 weeks. Coumadin Clinic [phone]

## 2021-05-22 ENCOUNTER — Other Ambulatory Visit: Payer: Self-pay

## 2021-05-22 ENCOUNTER — Ambulatory Visit: Payer: Medicare Other

## 2021-05-22 DIAGNOSIS — Z952 Presence of prosthetic heart valve: Secondary | ICD-10-CM | POA: Diagnosis not present

## 2021-05-22 DIAGNOSIS — Z5181 Encounter for therapeutic drug level monitoring: Secondary | ICD-10-CM

## 2021-05-22 LAB — POCT INR: INR: 2.8 (ref 2.0–3.0)

## 2021-05-22 NOTE — Patient Instructions (Signed)
Description   Continue on same dosage of Warfarin 1 tablet daily except 1/2 tablet on Mondays and Fridays. Recheck INR in 4 weeks (normally a 6 weeker). Coumadin Clinic [phone]

## 2021-06-01 ENCOUNTER — Other Ambulatory Visit: Payer: Self-pay | Admitting: Cardiovascular Disease

## 2021-06-01 DIAGNOSIS — Z952 Presence of prosthetic heart valve: Secondary | ICD-10-CM

## 2021-06-01 DIAGNOSIS — Z7901 Long term (current) use of anticoagulants: Secondary | ICD-10-CM

## 2021-06-19 ENCOUNTER — Other Ambulatory Visit: Payer: Self-pay

## 2021-06-19 ENCOUNTER — Ambulatory Visit: Payer: Medicare Other

## 2021-06-19 DIAGNOSIS — Z5181 Encounter for therapeutic drug level monitoring: Secondary | ICD-10-CM | POA: Diagnosis not present

## 2021-06-19 DIAGNOSIS — Z952 Presence of prosthetic heart valve: Secondary | ICD-10-CM | POA: Diagnosis not present

## 2021-06-19 LAB — POCT INR: INR: 3 (ref 2.0–3.0)

## 2021-06-19 NOTE — Patient Instructions (Signed)
Description   Continue on same dosage of Warfarin 1 tablet daily except 1/2 tablet on Mondays and Fridays. Recheck INR in 6 weeks. Coumadin Clinic [phone]

## 2021-07-31 ENCOUNTER — Ambulatory Visit: Payer: Medicare Other

## 2021-07-31 ENCOUNTER — Other Ambulatory Visit: Payer: Self-pay

## 2021-07-31 DIAGNOSIS — Z952 Presence of prosthetic heart valve: Secondary | ICD-10-CM | POA: Diagnosis not present

## 2021-07-31 DIAGNOSIS — Z5181 Encounter for therapeutic drug level monitoring: Secondary | ICD-10-CM

## 2021-07-31 LAB — POCT INR: INR: 2.8 (ref 2.0–3.0)

## 2021-07-31 NOTE — Patient Instructions (Signed)
Description   ?Continue on same dosage of Warfarin 1 tablet daily except 1/2 tablet on Mondays and Fridays. Recheck INR in 6 weeks. Coumadin Clinic [phone] ?  ?  ?

## 2021-08-14 ENCOUNTER — Encounter: Payer: Self-pay | Admitting: Cardiovascular Disease

## 2021-08-14 NOTE — Progress Notes (Signed)
? ? ?[name] ? ?Date of Birth  1935/05/19 ?  ? ? ?Problem list: ?1. Mitral valve replacement- mitral valve repair while in [place] and had mitral valve replacement here with Dr. Servando Snare 2003 ?2. Hyperlipidemia ? ? ? ?Notes from 2013: ? ? ?Terry [name] is a day 86 year old gentleman with a history of mitral valve replacement. He has done very well. His INR Levels have been therapeutic. He has not had any problems. ? ?He is doing very well.  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  ? ?Oct. 24, 2014: ? ?Terry [name] has done well.  Still volunteering at the hospital.   No CP , no dyspnea.   INR levels are ok.   ? ?November 19, 2013: ? ?Terry [name] is doing ok but has been having more palpitations associated with a very brief episode of lightheadedness. He's not had any episodes of syncope. These episodes seem to be more common in the morning and can occur when he is walking down the hall. He also has an occasionally at night when he is relaxing.  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 He will walks for exercise everyday. ? ? 02/21/2015. ? ?Doing well. ?Still working  at the hospital .  [facility] [employer] [facility] 5 days a week - 3 miles  ?No CP or dyspnea.  No further episodes of lightheadedness.  ? ?Oct. 20, 2017: ?  ?Doing well ?Still working at [employer]  ?INR levels are great.  ? ?Jan. 15, 2019: ? 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 ? ?Tries to walk every day  ?No CP or dyspnea.  INR is theraputic.  ? ?May 28, 2018: ?Terry [name] is seen today for follow-up of his mitral valve replacement.  He is done well.  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 ? ?Walks daily  ?INR levels have been therapeutic.  He denies any bleeding.  He does have occasional bruises. ? ?May 28, 2019: ? ?Terry [name] is seen for follow-up of his mitral valve replacement.  He is on Coumadin.  His INR levels have been therapeutic.  He has a  history of hyperlipidemia and is on rosuvastatin. ?Has had his first covid injection  ?Still very active .   Is on hiatus from volunteering at the hospital  ? ?Jan. 21, 2022 ? ?Terry [name] is doing ok today  ?INR is 4.1 today  ?Still volunteering at [facility] .  ? ?August 15, 2021: ?Terry [name] is doing well.  ?Hx of MV replacement in 2003 Servando Snare)  ?Still volunteers at [place] about 3 miles day ?INR levels are good ,  reviewed, theraputic ? ? ? ?Current Outpatient Medications on File Prior to Visit  ?Medication Sig Dispense Refill  ? aspirin 81 MG tablet Take 81 mg by mouth daily.    ? Calcium Carbonate-Vitamin D (CALCIUM + D PO) Take 600 mg by mouth daily.    ? Cholecalciferol (VITAMIN D3) 1000 UNITS tablet Take 1,000 Units by mouth daily.    ? COSOPT PF 22.3-6.8 MG/ML SOLN ophthalmic solution APPLY 1 DROP INTO BOTH EYES TWICE A DAY  3  ? CRESTOR 20 MG tablet Take 1 tablet by mouth Daily.    ? metoprolol succinate (TOPROL-XL) 100 MG 24 hr tablet TAKE 1 AND 1/2 TABLETS BY MOUTH DAILY 135 tablet 2  ? multivitamin [facility])  per tablet Take 1 tablet by mouth daily.    ? omeprazole (PRILOSEC OTC) 20 MG tablet Take 20 mg by mouth every other day.    ? propranolol (INDERAL) 10 MG tablet Take 10 mg by mouth as needed.    ? warfarin (COUMADIN) 5 MG tablet TAKE AS DIRECTED BY ANTICOAGULATION CLINIC 90 tablet 1  ? ZIOPTAN 0.0015 % SOLN APPLY 1 DROP INTO BOTH EYES AT BEDTIME  3  ? silodosin (RAPAFLO) 8 MG CAPS capsule Take 8 mg by mouth daily with breakfast. (Patient not taking: Reported on 08/15/2021)    ? ?No current facility-administered medications on file prior to visit.  ? ? ?Allergies  ?Allergen Reactions  ? Ciprofloxacin   ?  Heartburn  ? Sulfa Drugs Cross Reactors   ? ? ?Past Medical History:  ?Diagnosis Date  ? Benign prostatic hypertrophy   ? Dyslipidemia   ? Hyperlipidemia   ? Long-term (current) use of anticoagulants   ? Mitral valve prolapse   ? Osteoporosis   ? Ventricular tachycardia ([place])    ? ? ?Past Surgical History:  ?Procedure Laterality Date  ? APPENDICO-VESICOSTOMY CUTANEOUS    ? CARDIAC CATHETERIZATION  08/19/2001  ? Essentially smooth and normal coronary arteries  ? CORONARY STENT PLACEMENT  11/03/2001  ? Mitral valve replacement with #35 St. Jude mechanical mitral valve prosthesis and closure of  left atrial appendage  ? ? ?Social History  ? ?Tobacco Use  ?Smoking Status Former  ? Types: Cigarettes  ? Quit date: 05/13/1964  ? Years since quitting: 57.2  ?Smokeless Tobacco Never  ? ? ?Social History  ? ?Substance and Sexual Activity  ?Alcohol Use No  ? ? ?History reviewed. No pertinent family history. ? ?Reviw of Systems:  ?Noted in current history.  All other systems are negative. ? ? ?Physical Exam: ?Blood pressure 122/62, pulse 63, height 5' 11.5" (1.816 m), weight 172 lb (78 kg), SpO2 96 %. ? ?GEN:  Well nourished, well developed in no acute distress ?HEENT: Normal ?NECK: No JVD; No carotid bruits ?LYMPHATICS: No lymphadenopathy ?CARDIAC: RR,  mechanical S1,  ?RESPIRATORY:  Clear to auscultation without rales, wheezing or rhonchi  ?ABDOMEN: Soft, non-tender, non-distended ?MUSCULOSKELETAL:  No edema; No deformity  ?SKIN: Warm and dry ?NEUROLOGIC:  Alert and oriented x 3 ? ? ? ?EKG: ?  ?Assessment / Plan:  ? ?1. Mitral valve replacement- mitral valve repair while in [place] and had mitral valve replacement here with Dr. Servando Snare 2003 ? ?Inr levels have been theraputic  ? ?2. Hyperlipidemia -  managed by Dr. Joylene Draft  ? ? ?Mertie Moores, MD  ?08/15/2021 11:37 AM    ?[place] ?[address],  [address] ?[place], [place]  [address] ?Pager [phone] ?Phone: [phone]; Fax: [phone]  ? ? ?

## 2021-08-15 ENCOUNTER — Ambulatory Visit: Payer: Medicare Other | Admitting: Cardiovascular Disease

## 2021-08-15 ENCOUNTER — Encounter: Payer: Self-pay | Admitting: Cardiovascular Disease

## 2021-08-15 VITALS — BP 122/62 | HR 63 | Ht 71.5 in | Wt 172.0 lb

## 2021-08-15 DIAGNOSIS — Z952 Presence of prosthetic heart valve: Secondary | ICD-10-CM

## 2021-08-15 DIAGNOSIS — Z7901 Long term (current) use of anticoagulants: Secondary | ICD-10-CM

## 2021-08-15 NOTE — Patient Instructions (Signed)
Medication Instructions:  ?Your physician recommends that you continue on your current medications as directed. Please refer to the Current Medication list given to you today. ? ?*If you need a refill on your cardiac medications before your next appointment, please call your pharmacy* ? ?Lab Work: ?NONE ?If you have labs (blood work) drawn today and your tests are completely normal, you will receive your results only by: ?MyChart Message (if you have MyChart) OR ?A paper copy in the mail ?If you have any lab test that is abnormal or we need to change your treatment, we will call you to review the results. ? ?Testing/Procedures: ?NONE ? ?Follow-Up: ?At [facility], you and your health needs are our priority.  As part of our continuing mission to provide you with exceptional heart care, we have created designated Provider Care Teams.  These Care Teams include your primary Cardiologist (physician) and Advanced Practice Providers (APPs -  Physician Assistants and Nurse Practitioners) who all work together to provide you with the care you need, when you need it. ? ?Your next appointment:   ?1 year(s) ? ?The format for your next appointment:   ?In Person ? ?Provider:   ?Christen Bame, NP  ?

## 2021-09-12 ENCOUNTER — Ambulatory Visit (INDEPENDENT_AMBULATORY_CARE_PROVIDER_SITE_OTHER): Payer: Medicare Other | Admitting: *Deleted

## 2021-09-12 DIAGNOSIS — Z952 Presence of prosthetic heart valve: Secondary | ICD-10-CM

## 2021-09-12 DIAGNOSIS — Z5181 Encounter for therapeutic drug level monitoring: Secondary | ICD-10-CM

## 2021-09-12 LAB — POCT INR: INR: 3.3 — AB (ref 2.0–3.0)

## 2021-09-12 NOTE — Patient Instructions (Signed)
Description   ?Continue taking Warfarin 1 tablet daily except 1/2 tablet on Mondays and Fridays. Recheck INR in 6 weeks. Coumadin Clinic [phone] ?  ?  ?

## 2021-09-26 ENCOUNTER — Other Ambulatory Visit: Payer: Self-pay | Admitting: Gastroenterology

## 2021-09-26 DIAGNOSIS — Z1211 Encounter for screening for malignant neoplasm of colon: Secondary | ICD-10-CM

## 2021-10-23 ENCOUNTER — Ambulatory Visit: Payer: Medicare Other | Admitting: *Deleted

## 2021-10-23 DIAGNOSIS — Z5181 Encounter for therapeutic drug level monitoring: Secondary | ICD-10-CM | POA: Diagnosis not present

## 2021-10-23 DIAGNOSIS — Z952 Presence of prosthetic heart valve: Secondary | ICD-10-CM | POA: Diagnosis not present

## 2021-10-23 LAB — POCT INR: INR: 2.7 (ref 2.0–3.0)

## 2021-10-23 NOTE — Patient Instructions (Signed)
Description   Continue taking Warfarin 1 tablet daily except 1/2 tablet on Mondays and Fridays. Recheck INR in 6 weeks. Coumadin Clinic [phone]

## 2021-11-01 ENCOUNTER — Ambulatory Visit
Admission: RE | Admit: 2021-11-01 | Discharge: 2021-11-01 | Disposition: A | Discharge status: Home / Self Care | Payer: Medicare Other | Source: Ambulatory Visit | Attending: Gastroenterology | Admitting: Gastroenterology

## 2021-11-01 DIAGNOSIS — Z1211 Encounter for screening for malignant neoplasm of colon: Secondary | ICD-10-CM

## 2021-12-07 ENCOUNTER — Emergency Department (HOSPITAL_COMMUNITY): Payer: PRIVATE HEALTH INSURANCE

## 2021-12-07 ENCOUNTER — Other Ambulatory Visit: Payer: Self-pay

## 2021-12-07 ENCOUNTER — Encounter (HOSPITAL_COMMUNITY): Payer: Self-pay

## 2021-12-07 ENCOUNTER — Inpatient Hospital Stay (HOSPITAL_COMMUNITY)
Admission: EM | Admit: 2021-12-07 | Discharge: 2021-12-24 | DRG: 312 | Disposition: A | Discharge status: Skilled Nursing Facility | Payer: PRIVATE HEALTH INSURANCE | Attending: Internal Medicine | Admitting: Internal Medicine

## 2021-12-07 DIAGNOSIS — Z79899 Other long term (current) drug therapy: Secondary | ICD-10-CM

## 2021-12-07 DIAGNOSIS — Z955 Presence of coronary angioplasty implant and graft: Secondary | ICD-10-CM

## 2021-12-07 DIAGNOSIS — R54 Age-related physical debility: Secondary | ICD-10-CM | POA: Diagnosis present

## 2021-12-07 DIAGNOSIS — I10 Essential (primary) hypertension: Secondary | ICD-10-CM

## 2021-12-07 DIAGNOSIS — H05232 Hemorrhage of left orbit: Secondary | ICD-10-CM

## 2021-12-07 DIAGNOSIS — T148XXA Other injury of unspecified body region, initial encounter: Secondary | ICD-10-CM

## 2021-12-07 DIAGNOSIS — Z881 Allergy status to other antibiotic agents status: Secondary | ICD-10-CM

## 2021-12-07 DIAGNOSIS — I951 Orthostatic hypotension: Secondary | ICD-10-CM | POA: Diagnosis not present

## 2021-12-07 DIAGNOSIS — S0012XA Contusion of left eyelid and periocular area, initial encounter: Secondary | ICD-10-CM | POA: Diagnosis present

## 2021-12-07 DIAGNOSIS — Z7982 Long term (current) use of aspirin: Secondary | ICD-10-CM

## 2021-12-07 DIAGNOSIS — Z602 Problems related to living alone: Secondary | ICD-10-CM | POA: Diagnosis present

## 2021-12-07 DIAGNOSIS — Z952 Presence of prosthetic heart valve: Secondary | ICD-10-CM | POA: Diagnosis not present

## 2021-12-07 DIAGNOSIS — E785 Hyperlipidemia, unspecified: Secondary | ICD-10-CM

## 2021-12-07 DIAGNOSIS — R55 Syncope and collapse: Secondary | ICD-10-CM | POA: Diagnosis not present

## 2021-12-07 DIAGNOSIS — K5900 Constipation, unspecified: Secondary | ICD-10-CM | POA: Diagnosis not present

## 2021-12-07 DIAGNOSIS — Z7901 Long term (current) use of anticoagulants: Secondary | ICD-10-CM

## 2021-12-07 DIAGNOSIS — W19XXXA Unspecified fall, initial encounter: Secondary | ICD-10-CM | POA: Diagnosis not present

## 2021-12-07 DIAGNOSIS — S51012A Laceration without foreign body of left elbow, initial encounter: Secondary | ICD-10-CM | POA: Diagnosis present

## 2021-12-07 DIAGNOSIS — H1132 Conjunctival hemorrhage, left eye: Secondary | ICD-10-CM | POA: Diagnosis present

## 2021-12-07 DIAGNOSIS — M810 Age-related osteoporosis without current pathological fracture: Secondary | ICD-10-CM | POA: Diagnosis present

## 2021-12-07 DIAGNOSIS — Z66 Do not resuscitate: Secondary | ICD-10-CM | POA: Diagnosis present

## 2021-12-07 DIAGNOSIS — T07XXXA Unspecified multiple injuries, initial encounter: Secondary | ICD-10-CM

## 2021-12-07 DIAGNOSIS — W010XXA Fall on same level from slipping, tripping and stumbling without subsequent striking against object, initial encounter: Secondary | ICD-10-CM | POA: Diagnosis present

## 2021-12-07 DIAGNOSIS — Z882 Allergy status to sulfonamides status: Secondary | ICD-10-CM

## 2021-12-07 DIAGNOSIS — D62 Acute posthemorrhagic anemia: Secondary | ICD-10-CM

## 2021-12-07 DIAGNOSIS — Y92481 Parking lot as the place of occurrence of the external cause: Secondary | ICD-10-CM

## 2021-12-07 DIAGNOSIS — S7012XA Contusion of left thigh, initial encounter: Secondary | ICD-10-CM | POA: Diagnosis present

## 2021-12-07 DIAGNOSIS — S80212A Abrasion, left knee, initial encounter: Secondary | ICD-10-CM | POA: Diagnosis present

## 2021-12-07 DIAGNOSIS — S41102A Unspecified open wound of left upper arm, initial encounter: Secondary | ICD-10-CM

## 2021-12-07 DIAGNOSIS — N400 Enlarged prostate without lower urinary tract symptoms: Secondary | ICD-10-CM | POA: Diagnosis present

## 2021-12-07 LAB — CBC WITH DIFFERENTIAL/PLATELET
Abs Immature Granulocytes: 0.03 10*3/uL (ref 0.00–0.07)
Basophils Absolute: 0 10*3/uL (ref 0.0–0.1)
Basophils Relative: 0 %
Eosinophils Absolute: 0 10*3/uL (ref 0.0–0.5)
Eosinophils Relative: 0 %
HCT: 39.2 % (ref 39.0–52.0)
Hemoglobin: 12.7 g/dL — ABNORMAL LOW (ref 13.0–17.0)
Immature Granulocytes: 0 %
Lymphocytes Relative: 12 %
Lymphs Abs: 1.5 10*3/uL (ref 0.7–4.0)
MCH: 31.9 pg (ref 26.0–34.0)
MCHC: 32.4 g/dL (ref 30.0–36.0)
MCV: 98.5 fL (ref 80.0–100.0)
Monocytes Absolute: 1.4 10*3/uL — ABNORMAL HIGH (ref 0.1–1.0)
Monocytes Relative: 11 %
Neutro Abs: 9.6 10*3/uL — ABNORMAL HIGH (ref 1.7–7.7)
Neutrophils Relative %: 77 %
Platelets: 182 10*3/uL (ref 150–400)
RBC: 3.98 MIL/uL — ABNORMAL LOW (ref 4.22–5.81)
RDW: 12.9 % (ref 11.5–15.5)
WBC: 12.6 10*3/uL — ABNORMAL HIGH (ref 4.0–10.5)
nRBC: 0 % (ref 0.0–0.2)

## 2021-12-07 LAB — PROTIME-INR
INR: 3.5 — ABNORMAL HIGH (ref 0.8–1.2)
Prothrombin Time: 35 seconds — ABNORMAL HIGH (ref 11.4–15.2)

## 2021-12-07 LAB — CBC
HCT: 41.5 % (ref 39.0–52.0)
Hemoglobin: 13.5 g/dL (ref 13.0–17.0)
MCH: 31.8 pg (ref 26.0–34.0)
MCHC: 32.5 g/dL (ref 30.0–36.0)
MCV: 97.6 fL (ref 80.0–100.0)
Platelets: 177 10*3/uL (ref 150–400)
RBC: 4.25 MIL/uL (ref 4.22–5.81)
RDW: 12.9 % (ref 11.5–15.5)
WBC: 6.6 10*3/uL (ref 4.0–10.5)
nRBC: 0 % (ref 0.0–0.2)

## 2021-12-07 LAB — BASIC METABOLIC PANEL
Anion gap: 9 (ref 5–15)
BUN: 22 mg/dL (ref 8–23)
CO2: 26 mmol/L (ref 22–32)
Calcium: 8.7 mg/dL — ABNORMAL LOW (ref 8.9–10.3)
Chloride: 106 mmol/L (ref 98–111)
Creatinine, Ser: 0.98 mg/dL (ref 0.61–1.24)
GFR, Estimated: >60 mL/min (ref >60)
Glucose, Bld: 139 mg/dL — ABNORMAL HIGH (ref 70–99)
Potassium: 3.9 mmol/L (ref 3.5–5.1)
Sodium: 141 mmol/L (ref 135–145)

## 2021-12-07 MED ORDER — TRANEXAMIC ACID FOR EPISTAXIS
500.0000 mg | Freq: Once | TOPICAL | Status: AC
  Administered 2021-12-07: 500 mg via TOPICAL
  Filled 2021-12-07: qty 10

## 2021-12-07 NOTE — ED Notes (Signed)
Pt's wounds were wrapped twice with combat gauze & he was still bleeding through after pressure was held. EDP at bedside assessing the wounds at this time.

## 2021-12-07 NOTE — ED Notes (Signed)
Pt up to walk, began to feel dizzy, had syncopal episode lasting approx. 1 minute. Assisted to chair and then stretcher. Bolus hung per Dr. Maryan Rued.

## 2021-12-07 NOTE — ED Notes (Signed)
Suture cart & supplies requested by EDP at bedside.

## 2021-12-07 NOTE — ED Provider Notes (Signed)
[place] EMERGENCY DEPARTMENT Provider Note   CSN: [id number] Arrival date & time: 12/07/21  1310     History {Add pertinent medical, surgical, social history, OB history to HPI:1} No chief complaint on file.   Terry [name] is a 86 y.o. male with a history of a mechanical valve, Coumadin, presenting to ED with complaint of mechanical fall and abrasions to the forehead and hands.  Patient tripped in the parking lot reportedly over speed bump.  He fell onto his outstretched hands and also struck his left forehead on the ground.  He denies loss of consciousness.  He was having oozing or bleeding from the superficial cut sites of the bilateral arms, Rescriptor his hands  HPI     Home Medications Prior to Admission medications   Medication Sig Start Date End Date Taking? Authorizing Provider  aspirin 81 MG tablet Take 81 mg by mouth daily.    [provider]  Calcium Carbonate-Vitamin D (CALCIUM + D PO) Take 600 mg by mouth daily.    [provider]  Cholecalciferol (VITAMIN D3) 1000 UNITS tablet Take 1,000 Units by mouth daily.    [provider]  COSOPT PF 22.3-6.8 MG/ML SOLN ophthalmic solution APPLY 1 DROP INTO BOTH EYES TWICE A DAY 04/25/17   [provider]  CRESTOR 20 MG tablet Take 1 tablet by mouth Daily. 09/01/10   [provider]  metoprolol succinate (TOPROL-XL) 100 MG 24 hr tablet TAKE 1 AND 1/2 TABLETS BY MOUTH DAILY 03/05/21   Nahser, Wonda Cheng, MD  multivitamin [facility]) per tablet Take 1 tablet by mouth daily.    [provider]  omeprazole (PRILOSEC OTC) 20 MG tablet Take 20 mg by mouth every other day.    [provider]  propranolol (INDERAL) 10 MG tablet Take 10 mg by mouth as needed.    [provider]  silodosin (RAPAFLO) 8 MG CAPS capsule Take 8 mg by mouth daily with breakfast. Patient not taking: Reported on 08/15/2021    [provider]  warfarin (COUMADIN)  5 MG tablet TAKE AS DIRECTED BY ANTICOAGULATION CLINIC 06/01/21   Nahser, Wonda Cheng, MD  ZIOPTAN 0.0015 % SOLN APPLY 1 DROP INTO BOTH EYES AT BEDTIME 04/24/17   [provider]      Allergies    Ciprofloxacin and Sulfa drugs cross reactors    Review of Systems   Review of Systems  Physical Exam Updated Vital Signs BP 140/82 (BP Location: Left Arm)  Physical Exam Constitutional:      General: He is not in acute distress. HENT:     Head: Normocephalic.     Comments: Ecchymosis around left eye Eyes:     Conjunctiva/sclera: Conjunctivae normal.     Pupils: Pupils are equal, round, and reactive to light.  Cardiovascular:     Rate and Rhythm: Normal rate. Rhythm irregular.  Pulmonary:     Effort: Pulmonary effort is normal. No respiratory distress.  Abdominal:     General: There is no distension.     Tenderness: There is no abdominal tenderness.  Musculoskeletal:     Comments: Skin tear to the right hand, left forearm, small skin tear to the left cheek  Skin:    General: Skin is warm and dry.  Neurological:     General: No focal deficit present.     Mental Status: He is alert and oriented to person, place, and time. Mental status is at baseline.  Psychiatric:  Mood and Affect: Mood normal.        Behavior: Behavior normal.     ED Results / Procedures / Treatments   Labs (all labs ordered are listed, but only abnormal results are displayed) Labs Reviewed - No data to display  EKG None  Radiology No results found.  Procedures Procedures  {Document cardiac monitor, telemetry assessment procedure when appropriate:1}  Medications Ordered in ED Medications - No data to display  ED Course/ Medical Decision Making/ A&P                           Medical Decision Making Amount and/or Complexity of Data Reviewed Labs: ordered. Radiology: ordered.   Patient is here as a level 2 fall on Coumadin blood thinners with head injury.  No loss of  consciousness  CT imaging of the head and x-rays of the extremities ordered and personally reviewed and interpreted, showing ***  He does have some oozing from the superficial skin bleeding sites.  His LV irrigated, this is not amenable to suturing given how thin and friable his skin is, we will attempt quick clot powder or topical TXA  {Document critical care time when appropriate:1} {Document review of labs and clinical decision tools ie heart score, Chads2Vasc2 etc:1}  {Document your independent review of radiology images, and any outside records:1} {Document your discussion with family members, caretakers, and with consultants:1} {Document social determinants of health affecting pt's care:1} {Document your decision making why or why not admission, treatments were needed:1} Final Clinical Impression(s) / ED Diagnoses Final diagnoses:  None    Rx / DC Orders ED Discharge Orders     None

## 2021-12-07 NOTE — ED Notes (Signed)
X-ray at bedside

## 2021-12-07 NOTE — Progress Notes (Signed)
ANTICOAGULATION CONSULT NOTE - Initial Consult  Pharmacy Consult for Warfarin  Indication: MVR  Allergies  Allergen Reactions   Ciprofloxacin     Heartburn   Sulfa Drugs Cross Reactors     Patient Measurements: Height: '5\' 11"'$  (180.3 cm) Weight: 74.8 kg (165 lb) IBW/kg (Calculated) : 75.3  Vital Signs: Temp: 97.6 F (36.4 C) (07/28 1830) Temp Source: Oral (07/28 1322) BP: 119/75 (07/28 2230) Pulse Rate: 81 (07/28 2230)  Labs: Recent Labs    12/07/21 1355 12/07/21 2248  HGB 13.5 12.7*  HCT 41.5 39.2  PLT 177 182  LABPROT 35.0*  --   INR 3.5*  --   CREATININE 0.98  --     Estimated Creatinine Clearance: 59.4 mL/min (by C-G formula based on SCr of 0.98 mg/dL).   Medical History: Past Medical History:  Diagnosis Date   Benign prostatic hypertrophy    Dyslipidemia    Hyperlipidemia    Long-term (current) use of anticoagulants    Mitral valve prolapse    Osteoporosis    Ventricular tachycardia [facility])     [name], INR is 3.5, pt in the ED s/p fall with significant skin tears that took multiple hours to stop bleeding. No bleeding on CT head. Hgb 12.7.   Goal of Therapy:  INR 2.5-3.5 Monitor platelets by anticoagulation protocol: Yes   Plan:  INR with AM labs to assess dosing needs  Narda Bonds, PharmD, BCPS Clinical Pharmacist Phone: [phone]

## 2021-12-07 NOTE — Discharge Instructions (Addendum)
Follow with Primary MD Crist Infante, MD /SNF Physician  Get CBC, CMP,  checked  by Primary MD next visit.    Activity: As tolerated with Full fall precautions use walker/cane & assistance as needed   Disposition SNF   Diet: Heart Healthy    On your next visit with your primary care physician please Get Medicines reviewed and adjusted.   Please request your Prim.MD to go over all Hospital Tests and Procedure/Radiological results at the follow up, please get all Hospital records sent to your Prim MD by signing hospital release before you go home.   If you experience worsening of your admission symptoms, develop shortness of breath, life threatening emergency, suicidal or homicidal thoughts you must seek medical attention immediately by calling 911 or calling your MD immediately  if symptoms less severe.  You Must read complete instructions/literature along with all the possible adverse reactions/side effects for all the Medicines you take and that have been prescribed to you. Take any new Medicines after you have completely understood and accpet all the possible adverse reactions/side effects.   Do not drive, operating heavy machinery, perform activities at heights, swimming or participation in water activities or provide baby sitting services if your were admitted for syncope or siezures until you have seen by Primary MD or a Neurologist and advised to do so again.  Do not drive when taking Pain medications.    Do not take more than prescribed Pain, Sleep and Anxiety Medications  Special Instructions: If you have smoked or chewed Tobacco  in the last 2 yrs please stop smoking, stop any regular Alcohol  and or any Recreational drug use.  Wear Seat belts while driving.   Please note  You were cared for by a hospitalist during your hospital stay. If you have any questions about your discharge medications or the care you received while you were in the hospital after you are  discharged, you can call the unit and asked to speak with the hospitalist on call if the hospitalist that took care of you is not available. Once you are discharged, your primary care physician will handle any further medical issues. Please note that NO REFILLS for any discharge medications will be authorized once you are discharged, as it is imperative that you return to your primary care physician (or establish a relationship with a primary care physician if you do not have one) for your aftercare needs so that they can reassess your need for medications and monitor your lab values.

## 2021-12-07 NOTE — ED Notes (Signed)
Trauma Response Nurse Documentation   Terry [name] is a 86 y.o. 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 He was in the parking lot going to his car and his foot caught the edge of the curb causing him to go down onto cement causing abrasions to his left eye/face, left forearm/hand and right hand.  On warfarin daily. Trauma was activated as a Level 2 by CRN based on the following trauma criteria Elderly patients > 65 with head trauma on anti-coagulation (excluding ASA). Trauma team at the bedside on patient arrival. Patient cleared for CT by Dr. Langston Masker. Patient to CT with team. GCS 15.  History   Past Medical History:  Diagnosis Date   Benign prostatic hypertrophy    Dyslipidemia    Hyperlipidemia    Long-term (current) use of anticoagulants    Mitral valve prolapse    Osteoporosis    Ventricular tachycardia [facility])      Past Surgical History:  Procedure Laterality Date   APPENDICO-VESICOSTOMY CUTANEOUS     CARDIAC CATHETERIZATION  08/19/2001   Essentially smooth and normal coronary arteries   CORONARY STENT PLACEMENT  11/03/2001   Mitral valve replacement with #35 [employer] Jude mechanical mitral valve prosthesis and closure of  left atrial appendage     Initial Focused Assessment (If applicable, or please see trauma documentation): A&Ox4, GCS 15, PERR 3 Abrasions/laceration to left face/eye, left forearm hand and right hand. Wrapped and bleeding controlled.  CT's Completed:   CT Head and CT Maxillofacial   Interventions:  IV, labs XR L FA, R hand CT Head/Maxillofacial Sutures and pressure dressing  Plan for disposition:  Discharge home   Event Summary: Imaging showed no injury, attempts to control bleeding from extremity wounds. Will hopefully discharge home.  Bedside handoff with ED RN Arby Barrette.    Park Pope Ashe Graybeal  Trauma Response RN  Please call TRN at [phone] for further assistance.

## 2021-12-07 NOTE — ED Notes (Signed)
Pt's Temple wound was added more pressure to stop his bleeding through his combat guaze.

## 2021-12-07 NOTE — ED Notes (Signed)
Pt transported to CT with Trauma RN

## 2021-12-07 NOTE — H&P (Incomplete)
History and Physical    Patient: Terry [name] FAO:[id number] DOB: Mar 19, 1936 DOA: 12/07/2021 DOS: the patient was seen and examined on 12/08/2021 PCP: Crist Infante, MD  Patient coming from: Home  Chief Complaint:  Chief Complaint  Patient presents with   Level 2-Fall on thinners   HPI: Terry [name] is a 86 y.o. male with medical history significant of mitral valve replacement on warfarin, and high blood, history of V. tach, BPH who presents following a fall.  Patient is a Psychologist, occupational here at [employer] and when he was walking in the parking Terry he tripped and fell hitting his forehead.  He denies any lightheadedness or dizziness prior.  He was found to have abrasions to his forehead, forearm and hand.  INR was therapeutic at 3.5.  He had persistent bleeding from an arm wound that required figure of 8 suturing, combat gauze and pressure.  After multiple hours the bleeding was controlled.  ED physician then attempted to ambulate him prior to discharge but patient felt dizzy and had a syncopal episode with loss of consciousness for about a minute.    He otherwise remained normotensive in the ED.  No anemia or leukocytosis.  No significant electrolyte derangements.  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  Initial hemoglobin was normal at 13.5.  Repeat hemoglobin following syncopal episode at 12.7. Review of Systems: As mentioned in the history of present illness. All other systems reviewed and are negative. Past Medical History:  Diagnosis Date   Benign prostatic hypertrophy    Dyslipidemia    Hyperlipidemia    Long-term (current) use of anticoagulants    Mitral valve prolapse    Osteoporosis    Ventricular tachycardia [facility])    Past Surgical History:  Procedure Laterality Date   APPENDICO-VESICOSTOMY CUTANEOUS     CARDIAC CATHETERIZATION  08/19/2001   Essentially smooth and normal  coronary arteries   CORONARY STENT PLACEMENT  11/03/2001   Mitral valve replacement with #35 [employer]. Jude mechanical mitral valve prosthesis and closure of  left atrial appendage   Social History:  reports that he quit smoking about 57 years ago. His smoking use included cigarettes. He has never used smokeless tobacco. He reports that he does not drink alcohol and does not use drugs.  Allergies  Allergen Reactions   Ciprofloxacin     Heartburn   Sulfa Drugs Cross Reactors     History reviewed. No pertinent family history.  Prior to Admission medications   Medication Sig Start Date End Date Taking? Authorizing Provider  aspirin 81 MG tablet Take 81 mg by mouth daily.    [provider]  Calcium Carbonate-Vitamin D (CALCIUM + D PO) Take 600 mg by mouth daily.    [provider]  Cholecalciferol (VITAMIN D3) 1000 UNITS tablet Take 1,000 Units by mouth daily.    [provider]  COSOPT PF 22.3-6.8 MG/ML SOLN ophthalmic solution APPLY 1 DROP INTO BOTH EYES TWICE A DAY 04/25/17   [provider]  CRESTOR 20 MG tablet Take 1 tablet by mouth Daily. 09/01/10   [provider]  metoprolol succinate (TOPROL-XL) 100 MG 24 hr tablet TAKE 1 AND 1/2 TABLETS BY MOUTH DAILY 03/05/21   Nahser, Wonda Cheng, MD  multivitamin [facility]) per tablet Take 1 tablet by mouth daily.    [provider]  omeprazole (PRILOSEC OTC) 20 MG tablet Take 20 mg by mouth every other  day.    [provider]  propranolol (INDERAL) 10 MG tablet Take 10 mg by mouth as needed.    [provider]  silodosin (RAPAFLO) 8 MG CAPS capsule Take 8 mg by mouth daily with breakfast. Patient not taking: Reported on 08/15/2021    [provider]  warfarin (COUMADIN) 5 MG tablet TAKE AS DIRECTED BY ANTICOAGULATION CLINIC 06/01/21   Nahser, Wonda Cheng, MD  ZIOPTAN 0.0015 % SOLN APPLY 1 DROP INTO BOTH EYES AT BEDTIME 04/24/17   [provider]    Physical  Exam: Vitals:   12/07/21 2030 12/07/21 2100 12/07/21 2130 12/07/21 2230  BP: 140/90 121/85 123/77 119/75  Pulse: 81 79 78 81  Resp: '20 19 13 15  '$ Temp:      TempSrc:      SpO2: 96% 98% 96% 97%  Weight:      Height:       Constitutional: NAD, calm, elderly gentleman attempting to stand during orthostatic and became severely symptomatic feeling lightheaded and nauseous.  This gradually improved after being placed into the bed. Head is wrapped with clean Ace bandage across the forehead Eyes: Subconjunctival bleeding of the left eye with periorbital swelling and hematoma. ENMT: Mucous membranes are moist.  Neck: normal, supple,  Respiratory: clear to auscultation bilaterally, no wheezing, no crackles. Normal respiratory effort. No accessory muscle use.  Cardiovascular: Regular rate and rhythm, no murmurs / rubs / gallops. No extremity edema.  Abdomen: Soft, nontender nondistended.  Bowel sounds positive.  Musculoskeletal: no clubbing / cyanosis. No joint deformity upper and lower extremities.  Skin: Left forearm wrapped in clean Ace bandage.  Small abrasion to left knee.  Large golf ball sized firm hematoma to left lateral thigh. Neurologic: CN 2-12 grossly intact.  Sensation intact.  Strength 5/5 in all 4.  Psychiatric: Normal judgment and insight. Alert and oriented x 3. Normal mood.  Able to tell jokes. Data Reviewed:  See HPI  Assessment and Plan: * Syncope Pt had syncope with LOC when attempts made to ambulate in ED. Orthostatic vital signs later attempted and he had no significant drop in blood pressure from laying to sitting but became severely symptomatic with dizziness and nausea when attempting to stand.  Suspect orthostatic hypotension more likely due to postconcussive symptoms. -Initial CT head is negative for acute findings although did show a possible subacute to chronic infarct -Will follow with neurochecks q2hr x 2 and then q4hr if he remains stable. Currently has no  abnormal findings on neurological exam.  Low threshold to repeat CT head imaging if he has any significant clinical changes overnight -Keep on continuous telemetry - Obtain echocardiogram  Multiple abrasions -Subconjunctival with periorbital hematoma. Stable with no vision changes -left forearm abrasion.  This had more persistent bleeding requiring figure-of-eight, combat gauze and pressure dressing.  This is now stable with clean bandage. -Left lateral thigh hematoma -Small left anterior patella abrasion -Although patient had persistent bleeding these were at non-life-threatening locations and is now controlled.  Benefit still outweighs risk to continue Coumadin due to his mechanical mitral valve.  Hemoglobin is stable at 13.5 with repeat at 12.7.  We will follow again in the morning.  H/O mitral valve replacement with mechanical valve Therapeutic today at 3.5 with goal of 2.5-3.5 -Coumadin per pharmacy      Advance Care Planning: DNR- confirmed with pt and niece also has healthcare power of attorney  Consults: none  Family Communication: Discussed with niece at bedside  Severity of Illness:  The appropriate patient status for this patient is OBSERVATION. Observation status is judged to be reasonable and necessary in order to provide the required intensity of service to ensure the patient's safety. The patient's presenting symptoms, physical exam findings, and initial radiographic and laboratory data in the context of their medical condition is felt to place them at decreased risk for further clinical deterioration. Furthermore, it is anticipated that the patient will be medically stable for discharge from the hospital within 2 midnights of admission.   Author: Orene Desanctis, DO 12/08/2021 12:18 AM  For on call review [email or web address].

## 2021-12-07 NOTE — ED Triage Notes (Addendum)
Pt came in from the parking deck here at the hospital d/t tripping over the speed bump. He is on Coumadin, A/Ox4, arrived to ED rating pain 2/10. Have skin tears to bilateral hands, Left forearm, & Left temple, bleeding controlled.

## 2021-12-07 NOTE — ED Notes (Signed)
Pt unable to complete a full set of orthostatic vitals. Pt stated he is feeling dizzy and needs to sit back on bed. Once back in bed pt stated he feels like he needs to vomit. Pt provided vomit bag. Vitals retaken once laying back down. BP now 89/58. Admitting doctor came in, is updated and is currently at bedside. Nurse notified.

## 2021-12-07 NOTE — ED Notes (Signed)
Pt's Lt FA continues to bleed through dressings, EDP Trifan is putting in some stitches at this time.

## 2021-12-07 NOTE — ED Provider Notes (Signed)
Patient was checked out by Dr. Langston Masker at 330.  Patient had a fall in the parking lot with significant skin tears of the arms, face.  Patient is on Coumadin with a therapeutic INR of 3.5.  Patient had persistent bleeding from his arm wound that required figure-of-eight suturing, combat gauze and pressure.  After multiple hours in the emergency room the bleeding did finally stop.  He has also multiple wounds to his hands and face.  He was scanned showed no intracranial bleed.  Patient was feeling okay however when attempting to walk him patient had a syncopal event.  He has now been in the emergency room for 9 and half hours.  We will recheck his hemoglobin.  The event was short and he regained consciousness.  However do not feel he is appropriate for discharge home.   Blanchie Dessert, MD 12/07/21 [phone]

## 2021-12-08 ENCOUNTER — Observation Stay (HOSPITAL_COMMUNITY): Payer: PRIVATE HEALTH INSURANCE

## 2021-12-08 ENCOUNTER — Other Ambulatory Visit (HOSPITAL_COMMUNITY): Payer: Medicare Other

## 2021-12-08 DIAGNOSIS — W19XXXA Unspecified fall, initial encounter: Secondary | ICD-10-CM | POA: Diagnosis not present

## 2021-12-08 DIAGNOSIS — Z952 Presence of prosthetic heart valve: Secondary | ICD-10-CM | POA: Diagnosis not present

## 2021-12-08 DIAGNOSIS — T148XXA Other injury of unspecified body region, initial encounter: Secondary | ICD-10-CM

## 2021-12-08 DIAGNOSIS — R55 Syncope and collapse: Secondary | ICD-10-CM

## 2021-12-08 DIAGNOSIS — T07XXXA Unspecified multiple injuries, initial encounter: Secondary | ICD-10-CM

## 2021-12-08 LAB — ECHOCARDIOGRAM COMPLETE
Area-P 1/2: 4.31 cm2
Height: 71 in
MV VTI: 2.1 cm2
Weight: 2640 oz

## 2021-12-08 LAB — CBC
HCT: 35.3 % — ABNORMAL LOW (ref 39.0–52.0)
Hemoglobin: 11.5 g/dL — ABNORMAL LOW (ref 13.0–17.0)
MCH: 31.6 pg (ref 26.0–34.0)
MCHC: 32.6 g/dL (ref 30.0–36.0)
MCV: 97 fL (ref 80.0–100.0)
Platelets: 165 10*3/uL (ref 150–400)
RBC: 3.64 MIL/uL — ABNORMAL LOW (ref 4.22–5.81)
RDW: 12.9 % (ref 11.5–15.5)
WBC: 11.7 10*3/uL — ABNORMAL HIGH (ref 4.0–10.5)
nRBC: 0 % (ref 0.0–0.2)

## 2021-12-08 LAB — PROTIME-INR
INR: 3.2 — ABNORMAL HIGH (ref 0.8–1.2)
Prothrombin Time: 32.4 seconds — ABNORMAL HIGH (ref 11.4–15.2)

## 2021-12-08 MED ORDER — WARFARIN - PHARMACIST DOSING INPATIENT: Freq: Every day | Status: DC

## 2021-12-08 MED ORDER — ROSUVASTATIN CALCIUM 20 MG PO TABS
20.0000 mg | ORAL_TABLET | Freq: Every day | ORAL | Status: DC
  Administered 2021-12-08 – 2021-12-24 (×17): 20 mg via ORAL
  Filled 2021-12-08 (×17): qty 1

## 2021-12-08 MED ORDER — PERFLUTREN LIPID MICROSPHERE
1.0000 mL | INTRAVENOUS | Status: AC | PRN
  Administered 2021-12-08: 3 mL via INTRAVENOUS

## 2021-12-08 MED ORDER — SODIUM CHLORIDE 0.9 % IV SOLN
INTRAVENOUS | Status: AC
Start: 2021-12-08 — End: 2021-12-08

## 2021-12-08 MED ORDER — WARFARIN SODIUM 5 MG PO TABS
5.0000 mg | ORAL_TABLET | Freq: Once | ORAL | Status: AC
  Administered 2021-12-08: 5 mg via ORAL
  Filled 2021-12-08: qty 1

## 2021-12-08 NOTE — Evaluation (Signed)
Occupational Therapy Evaluation Patient Details Name: Terry [name] MRN: [id number] DOB: [birth year] Today's Date: 12/08/2021   History of Present Illness 86 y/o male presented to ED on 12/07/21 after fall in parking lot. Had a syncopal event in ED. 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 Awaiting MRI. PMH: mitral valve replacement, HTN   Clinical Impression   Pt independent at baseline with ADLs and functional mobility, lives in an in-law suite on niece's property. Pt currently min A for ADLs, min guard for short distance mobility, limited by orthostatic hypotension, pt asymptomatic. Pt BP from sit >stand orthostatic however 99/66 with initiatial stand and 99/70 after 3 mins. Pt presenting with impairments listed below, will follow acutely. Anticipate not OT follow up pending pt progression.      Recommendations for follow up therapy are one component of a multi-disciplinary discharge planning process, led by the attending physician.  Recommendations may be updated based on patient status, additional functional criteria and insurance authorization.   Follow Up Recommendations  No OT follow up (anticipate will not need OT follow up pending progress with further mobility)    Assistance Recommended at Discharge Set up Supervision/Assistance  Patient can return home with the following A little help with walking and/or transfers;A little help with bathing/dressing/bathroom;Assistance with cooking/housework;Direct supervision/assist for medications management;Direct supervision/assist for financial management;Assist for transportation;Help with stairs or ramp for entrance    Functional Status Assessment  Patient has had a recent decline in their functional status and demonstrates the ability to make significant improvements in function in a reasonable and predictable amount of time.  Equipment Recommendations  None recommended by OT (pt has all needed DME)     Recommendations for Other Services PT consult     Precautions / Restrictions Precautions Precautions: Fall Precaution Comments: watch BP Restrictions Weight Bearing Restrictions: No      Mobility Bed Mobility               General bed mobility comments: sitting EOB on arrival    Transfers Overall transfer level: Needs assistance Equipment used: None Transfers: Sit to/from Stand, Bed to chair/wheelchair/BSC Sit to Stand: Min guard     Step pivot transfers: Min guard     General transfer comment: min guard for safety. decreased stance time on L during step pivot transfer due to pain      Balance Overall balance assessment: Needs assistance Sitting-balance support: No upper extremity supported, Feet supported Sitting balance-Leahy Scale: Good     Standing balance support: Single extremity supported, During functional activity Standing balance-Leahy Scale: Fair                             ADL either performed or assessed with clinical judgement   ADL Overall ADL's : Needs assistance/impaired Eating/Feeding: Modified independent   Grooming: Min guard   Upper Body Bathing: Minimal assistance   Lower Body Bathing: Minimal assistance   Upper Body Dressing : Minimal assistance   Lower Body Dressing: Minimal assistance Lower Body Dressing Details (indicate cue type and reason): to don sock Toilet Transfer: Minimal assistance   Toileting- Clothing Manipulation and Hygiene: Minimal assistance       Functional mobility during ADLs: Minimal assistance       Vision Patient Visual Report: No change from baseline Additional Comments: pt noted with bruising and blood in L eye     Perception     Praxis  Pertinent Vitals/Pain Pain Assessment Pain Assessment: Faces Faces Pain Scale: Hurts even more Pain Location: L hip Pain Descriptors / Indicators: Discomfort, Grimacing, Guarding Pain Intervention(s): Limited activity within patient's  tolerance, Monitored during session     Hand Dominance     Extremity/Trunk Assessment Upper Extremity Assessment Upper Extremity Assessment: Overall WFL for tasks assessed (functional, WFL given bandages limiting mobility, bil hands and L elbow bandaged)   Lower Extremity Assessment Lower Extremity Assessment: Defer to PT evaluation   Cervical / Trunk Assessment Cervical / Trunk Assessment: Kyphotic   Communication Communication Communication: HOH   Cognition Arousal/Alertness: Awake/alert Behavior During Therapy: WFL for tasks assessed/performed Overall Cognitive Status: Within Functional Limits for tasks assessed                                       General Comments  pt orthostatic with sit > stand transfer, however BP soft for standing mobility 99/66 (inital stand) and 99/70 (after 3 mins); pt asymptomatic    Exercises     Shoulder Instructions      Home Living Family/patient expects to be discharged to:: Private residence Living Arrangements: Other relatives (niece) Available Help at Discharge: Family;Available 24 hours/day Type of Home: Apartment (tiny home/in law suite on neice's property) Home Access: Level entry     Home Layout: One level     Bathroom Shower/Tub: Occupational psychologist: Handicapped height     Home Equipment: Grab bars - tub/shower;Grab bars - toilet;Shower seat - built in          Prior Functioning/Environment Prior Level of Function : Independent/Modified Independent;Driving             Mobility Comments: works as Psychologist, occupational at [employer]          OT Problem List: Decreased strength;Decreased range of motion;Decreased activity tolerance;Impaired balance (sitting and/or standing);Impaired UE functional use      OT Treatment/Interventions: Self-care/ADL training;Therapeutic exercise;DME and/or AE instruction;Energy conservation;Therapeutic activities;Patient/family education;Balance training    OT  Goals(Current goals can be found in the care plan section) Acute Rehab OT Goals Patient Stated Goal: to get better OT Goal Formulation: With patient Time For Goal Achievement: 12/22/21 Potential to Achieve Goals: Good ADL Goals Pt Will Perform Upper Body Dressing: with modified independence;sitting Pt Will Perform Lower Body Dressing: with modified independence;sitting/lateral leans;sit to/from stand Pt Will Transfer to Toilet: with modified independence;regular height toilet;ambulating Pt Will Perform Tub/Shower Transfer: Shower transfer;shower seat;ambulating;with modified independence Additional ADL Goal #1: pt will complete bed mobility with mod I in prep for ADLs  OT Frequency: Min 2X/week    Co-evaluation              AM-PAC OT "6 Clicks" Daily Activity     Outcome Measure Help from another person eating meals?: None Help from another person taking care of personal grooming?: A Little Help from another person toileting, which includes using toliet, bedpan, or urinal?: A Little Help from another person bathing (including washing, rinsing, drying)?: A Little Help from another person to put on and taking off regular upper body clothing?: A Little Help from another person to put on and taking off regular lower body clothing?: A Little 6 Click Score: 19   End of Session Equipment Utilized During Treatment: Gait belt;Rolling walker (2 wheels) Nurse Communication: Mobility status;Other (comment) (pt's IV came out)  Activity Tolerance: Patient tolerated treatment well Patient left: in  chair;with call bell/phone within reach;with chair alarm set  OT Visit Diagnosis: Other abnormalities of gait and mobility (R26.89);Unsteadiness on feet (R26.81);Muscle weakness (generalized) (M62.81)                Time: 1100-1119 OT Time Calculation (min): 19 min Charges:  OT General Charges $OT Visit: 1 Visit OT Evaluation $OT Eval Low Complexity: 1 Low  Lynnda Child, OTD, OTR/L Acute  Rehab [phone] - [place] 12/08/2021, 12:19 PM

## 2021-12-08 NOTE — Progress Notes (Signed)
PROGRESS NOTE        PATIENT DETAILS Name: Terry [name] Age: 86 y.o. Sex: male Date of Birth: 08/12/1935 Admit Date: 12/07/2021 Admitting Physician Orene Desanctis, DO VEH:MCNOBS, [name], MD  Brief Summary: Patient is a 86 y.o.  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  See below for further details.   Significant events: 7/28>> fall at [facility] tears on bilateral arms/face-s/p suturing/combat gauze/pressure-subsequently when attempting to ambulate-he syncopized.  Admit to [facility]. 7/29>> orthostatic vital signs positive  Significant studies: 7/28>> x-ray left forearm: No fracture/dislocation.  Soft tissue swelling over the dorsum. 7/28>> x-ray right hand: No fracture. 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 7/28>> CT maxillofacial: Left periorbital soft tissue swelling without acute fracture.  Significant microbiology data: None  Procedures: None  Consults: None  Subjective: Lying comfortably in bed-denies any chest pain or shortness of breath.  Speech clear.  Moving all 4 extremities.  Has pain in his left periorbital area-and in his bilateral upper extremities from where he sustained soft tissue injury when he fell yesterday.  Objective: Vitals: Blood pressure 124/69, pulse 84, temperature 98 F (36.7 C), temperature source Oral, resp. rate 17, height '5\' 11"'$  (1.803 m), weight 74.8 kg, SpO2 99 %.   Exam: Gen Exam:Alert awake-not in any distress.  Left periorbital ecchymosis present.  Numerous bandages present-did not open. HEENT:atraumatic, normocephalic Chest: B/L  clear to auscultation anteriorly CVS:S1S2 regular.  Metallic click. Abdomen:soft non tender, non distended Extremities:no edema.  Both upper extremities wrapped in compression bandages. Neurology: Non focal Skin: no rash  Pertinent Labs/Radiology:    Latest Ref Rng & Units 12/08/2021    3:28 AM 12/07/2021   10:48 PM 12/07/2021    1:55 PM  CBC  WBC 4.0 - 10.5 K/uL 11.7  12.6  6.6   Hemoglobin 13.0 - 17.0 g/dL 11.5  12.7  13.5   Hematocrit 39.0 - 52.0 % 35.3  39.2  41.5   Platelets 150 - 400 K/uL 165  182  177     Lab Results  Component Value Date   NA 141 12/07/2021   K 3.9 12/07/2021   CL 106 12/07/2021   CO2 26 12/07/2021      Assessment/Plan: Syncope: Likely orthostatic hypotension-telemetry negative-echo pending-continue gentle hydration for a few more hours today-Place thigh-high TED stockings-hold all antihypertensives-and repeat orthostatic vital signs tomorrow.  Mechanical fall with soft tissue injury involving left pelvic periorbital area/bilateral upper extremities: Supportive care-mobilize with PT/OT.  Per patient he is updated with his tetanus vaccine.  Note-he required suture to his left forearm small arterial bleeding.  Low-density changes seen on occipital lobe on CT imaging: Getting MRI brain to further delineate pathology.  Mechanical mitral valve replacement: On Coumadin-being managed by pharmacy.  Goal INR 3.5-4.5.  HTN: Hold all antihypertensives for now.  Recheck orthostatic vital signs tomorrow before resuming.  HLD: On statin  BMI: Estimated body mass index is 23.01 kg/m as calculated from the following:   Height as of this encounter: '5\' 11"'$  (1.803 m).  Weight as of this encounter: 74.8 kg.   Code status:   Code Status: DNR   DVT Prophylaxis: warfarin (COUMADIN) tablet 5 mg    Family Communication: None at bedside   Disposition Plan: Status is: Observation The patient will require care spanning > 2 midnights and should be moved to  inpatient because: Ongoing orthostatic hypotension-continuing gentle hydration for a few more hours-holding all antihypertensives-not yet stable for discharge.   Planned Discharge Destination:Home likely on 7/30   Diet: Diet Order             Diet regular Room service appropriate? Yes; Fluid consistency: Thin  Diet effective now                     Antimicrobial agents: Anti-infectives (From admission, onward)    None        MEDICATIONS: Scheduled Meds:  rosuvastatin  20 mg Oral Daily   warfarin  5 mg Oral ONCE-1600   Warfarin - Pharmacist Dosing Inpatient   Does not apply q1600   Continuous Infusions:  sodium chloride 75 mL/hr at 12/08/21 0250   PRN Meds:.   I have personally reviewed following labs and imaging studies  LABORATORY DATA: CBC: Recent Labs  Lab 12/07/21 1355 12/07/21 2248 12/08/21 0328  WBC 6.6 12.6* 11.7*  NEUTROABS  --  9.6*  --   HGB 13.5 12.7* 11.5*  HCT 41.5 39.2 35.3*  MCV 97.6 98.5 97.0  PLT 177 182 751    Basic Metabolic Panel: Recent Labs  Lab 12/07/21 1355  NA 141  K 3.9  CL 106  CO2 26  GLUCOSE 139*  BUN 22  CREATININE 0.98  CALCIUM 8.7*    GFR: Estimated Creatinine Clearance: 59.4 mL/min (by C-G formula based on SCr of 0.98 mg/dL).  Liver Function Tests: No results for input(s): "AST", "ALT", "ALKPHOS", "BILITOT", "PROT", "ALBUMIN" in the last 168 hours. No results for input(s): "LIPASE", "AMYLASE" in the last 168 hours. No results for input(s): "AMMONIA" in the last 168 hours.  Coagulation Profile: Recent Labs  Lab 12/07/21 1355 12/08/21 0328  INR 3.5* 3.2*    Cardiac Enzymes: No results for input(s): "CKTOTAL", "CKMB", "CKMBINDEX", "TROPONINI" in the last 168 hours.  BNP (last 3 results) No results for input(s): "PROBNP" in the last 8760 hours.  Lipid Profile: No results for input(s): "CHOL", "HDL", "LDLCALC", "TRIG", "CHOLHDL", "LDLDIRECT" in the last 72 hours.  Thyroid Function Tests: No  results for input(s): "TSH", "T4TOTAL", "FREET4", "T3FREE", "THYROIDAB" in the last 72 hours.  Anemia Panel: No results for input(s): "VITAMINB12", "FOLATE", "FERRITIN", "TIBC", "IRON", "RETICCTPCT" in the last 72 hours.  Urine analysis: No results found for: "COLORURINE", "APPEARANCEUR", "LABSPEC", "PHURINE", "GLUCOSEU", "HGBUR", "BILIRUBINUR", "KETONESUR", "PROTEINUR", "UROBILINOGEN", "NITRITE", "LEUKOCYTESUR"  Sepsis Labs: Lactic Acid, Venous No results found for: "LATICACIDVEN"  MICROBIOLOGY: No results found for this or any previous visit (from the past 240 hour(s)).  RADIOLOGY STUDIES/RESULTS: CT HEAD WO CONTRAST (5MM)  Result Date: 12/07/2021 CLINICAL DATA:  Fall.  Left eye pain.  Head trauma EXAM: CT HEAD WITHOUT CONTRAST CT MAXILLOFACIAL WITHOUT CONTRAST TECHNIQUE: Multidetector CT imaging of the head and maxillofacial structures were performed using the standard protocol without intravenous contrast. Multiplanar CT image reconstructions of the maxillofacial structures were also generated. RADIATION DOSE REDUCTION: This exam was performed according to the departmental dose-optimization program which includes automated exposure control, adjustment of the mA and/or kV according to patient size and/or use of iterative reconstruction technique. 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 Elsewhere, no evidence of an acute infarction. No evidence of hemorrhage, hydrocephalus, extra-axial collection or mass lesion/mass effect. Scattered low-density changes within the periventricular and subcortical white matter compatible with chronic microvascular ischemic change. Mild diffuse cerebral volume loss. Vascular: Atherosclerotic calcifications involving the large vessels of the skull base. No unexpected hyperdense vessel. Skull: Normal. Negative for fracture or focal lesion. Other: Negative for  scalp hematoma. CT MAXILLOFACIAL FINDINGS Osseous: No acute maxillofacial bone fracture. Bony orbital walls are intact. Mandible intact. Temporomandibular joints are aligned without dislocation. Degenerative changes of the temporomandibular joints. Orbits: Negative. No traumatic or inflammatory finding. Sinuses: Clear. Soft tissues: Left periorbital soft tissue swelling. IMPRESSION: 1. 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 Consider dedicated MRI to further characterize. 2. Otherwise, no acute intracranial findings. 3. Left periorbital soft tissue swelling without acute maxillofacial bone fracture. Electronically Signed   By: Davina Poke D.O.   On: 12/07/2021 13:59   CT Maxillofacial Wo Contrast  Result Date: 12/07/2021 CLINICAL DATA:  Fall.  Left eye pain.  Head trauma EXAM: CT HEAD WITHOUT CONTRAST CT MAXILLOFACIAL WITHOUT CONTRAST TECHNIQUE: Multidetector CT imaging of the head and maxillofacial structures were performed using the standard protocol without intravenous contrast. Multiplanar CT image reconstructions of the maxillofacial structures were also generated. RADIATION DOSE REDUCTION: This exam was performed according to the departmental dose-optimization program which includes automated exposure control, adjustment of the mA and/or kV according to patient size and/or use of iterative reconstruction technique. 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 Elsewhere, no evidence of an acute infarction. No evidence of hemorrhage, hydrocephalus, extra-axial collection or mass lesion/mass effect. Scattered low-density changes within the periventricular and subcortical white matter compatible with chronic microvascular ischemic change. Mild diffuse cerebral volume loss. Vascular: Atherosclerotic calcifications involving the large vessels of the  skull base. No unexpected hyperdense vessel. Skull: Normal. Negative for fracture or focal lesion. Other: Negative for scalp hematoma. CT MAXILLOFACIAL FINDINGS Osseous: No acute maxillofacial bone fracture. Bony orbital walls are intact. Mandible intact. Temporomandibular joints are aligned without dislocation. Degenerative changes of the temporomandibular joints. Orbits: Negative. No traumatic or inflammatory finding. Sinuses: Clear. Soft tissues: Left periorbital soft tissue swelling. IMPRESSION: 1. 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 Consider dedicated MRI to further characterize. 2. Otherwise, no acute intracranial findings. 3. Left periorbital soft tissue swelling without acute maxillofacial bone fracture. Electronically Signed   By: Davina Poke D.O.   On: 12/07/2021 13:59   DG Forearm Left  Result Date: 12/07/2021 CLINICAL DATA:  Fall, left forearm pain EXAM: LEFT FOREARM - 2 VIEW COMPARISON:  None Available. FINDINGS: There is no evidence of fracture or dislocation. Soft tissue swelling over the dorsum of the forearm. IMPRESSION: No acute fracture or dislocation. Soft tissue swelling over the dorsum of the forearm. Electronically Signed   By: Davina Poke D.O.   On: 12/07/2021 13:42   DG Hand Complete Right  Result Date: 12/07/2021 CLINICAL DATA:  Fall.  Right hand laceration EXAM: RIGHT HAND - COMPLETE 3+ VIEW COMPARISON:  None Available. FINDINGS: No acute fracture. No dislocation. Severe arthropathy of the first [facility] joint. Mild-to-moderate degenerative changes throughout the rest of the hand. Mild soft tissue swelling. Scattered atherosclerotic vascular calcifications. IMPRESSION: 1. No acute osseous abnormality of the right hand. 2.  Degenerative changes of the hand, severe at the first [facility] joint. Electronically Signed   By: Davina Poke D.O.   On: 12/07/2021 13:41     LOS: 0 days   Oren Binet, MD  [facility]    To contact the attending provider between 7A-7P or the covering provider during after hours 7P-7A, please log into the web site [email or web address] and access using universal [place] password for that web site. If you do not have the password, please call the hospital operator.  12/08/2021, 9:46 AM

## 2021-12-08 NOTE — Progress Notes (Signed)
Echocardiogram 2D Echocardiogram has been performed.  Oneal Deputy Sanaiyah Kirchhoff RDCS 12/08/2021, 2:13 PM  Peripheral IV started in order to administer perflutren as part of echo, IV left in for use during this admission.  Patient is on blood thinners and has pre-existing bruise to the left chest, this bruise was irritated and started to blister from probe during echo despite light pressure being used.  RN notified of both events.

## 2021-12-08 NOTE — Assessment & Plan Note (Addendum)
-  Subconjunctival with periorbital hematoma. Stable with no vision changes -left forearm abrasion.  This had more persistent bleeding requiring figure-of-eight, combat gauze and pressure dressing.  This is now stable with clean bandage. -Left lateral thigh hematoma -Small left anterior patella abrasion -Although patient had persistent bleeding these were at non-life-threatening locations and is now controlled.  Benefit still outweighs risk to continue Coumadin due to his mechanical mitral valve.  Hemoglobin is stable at 13.5 with repeat at 12.7.  We will follow again in the morning.

## 2021-12-08 NOTE — Assessment & Plan Note (Signed)
Therapeutic today at 3.5 with goal of 2.5-3.5 -Coumadin per pharmacy

## 2021-12-08 NOTE — Assessment & Plan Note (Addendum)
Pt had syncope with LOC when attempts made to ambulate in ED. Orthostatic vital signs later attempted and he had no significant drop in blood pressure from laying to sitting but became severely symptomatic with dizziness and nausea when attempting to stand.  Suspect orthostatic hypotension more likely due to postconcussive symptoms. -Initial CT head is negative for acute findings although did show a possible subacute to chronic infarct -Will follow with neurochecks q2hr x 2 and then q4hr if he remains stable. Currently has no abnormal findings on neurological exam.  Low threshold to repeat CT head imaging if he has any significant clinical changes overnight -Keep on continuous telemetry - Obtain echocardiogram

## 2021-12-08 NOTE — Evaluation (Signed)
Physical Therapy Evaluation Patient Details Name: Terry [name] MRN: [id number] DOB: 1936-04-03 Today's Date: 12/08/2021  History of Present Illness  86 y/o male presented to ED on 12/07/21 after fall in parking lot. Had a syncopal event in ED. 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 Awaiting MRI. PMH: mitral valve replacement, HTN  Clinical Impression  Patient admitted with the above. PTA, patient lives in apartment connected to niece's home and was independent as well as working as Psychologist, occupational at [employer]. Patient presents with weakness, impaired balance, decreased activity tolerance, and pain. Patient able to stand and transfer to recliner with min guard and no AD. Patient complaining of pain in L hip. Obtained orthostatics but asymptomatic, see below. Patient will benefit from skilled PT services during acute stay to address listed deficits. No PT follow up recommended at this time, per patient request but will consider if needed after ambulating further.   Orthostatic BPs  Sitting 107/67  Standing 89/59  Standing after 3 min 102/63        Recommendations for follow up therapy are one component of a multi-disciplinary discharge planning process, led by the attending physician.  Recommendations may be updated based on patient status, additional functional criteria and insurance authorization.  Follow Up Recommendations No PT follow up (patient prefers no follow up but will consider if needed after further ambulation)      Assistance Recommended at Discharge Intermittent Supervision/Assistance  Patient can return home with the following  A little help with walking and/or transfers;Assistance with cooking/housework;Assist for transportation;Help with stairs or ramp for entrance    Equipment Recommendations Rolling Francia Verry (2 wheels)  Recommendations for Other Services       Functional Status Assessment Patient has had a recent decline in their functional status  and demonstrates the ability to make significant improvements in function in a reasonable and predictable amount of time.     Precautions / Restrictions Precautions Precautions: Fall Precaution Comments: watch BP Restrictions Weight Bearing Restrictions: No      Mobility  Bed Mobility               General bed mobility comments: sitting EOB on arrival    Transfers Overall transfer level: Needs assistance Equipment used: None Transfers: Sit to/from Stand, Bed to chair/wheelchair/BSC Sit to Stand: Min guard   Step pivot transfers: Min guard       General transfer comment: min guard for safety. decreased stance time on L during step pivot transfer due to pain    Ambulation/Gait               General Gait Details: patient declined hallway ambulation due to pain and feeling tired  Stairs            Wheelchair Mobility    Modified Rankin (Stroke Patients Only)       Balance Overall balance assessment: Needs assistance Sitting-balance support: No upper extremity supported, Feet supported Sitting balance-Leahy Scale: Good     Standing balance support: Single extremity supported, During functional activity Standing balance-Leahy Scale: Fair                               Pertinent Vitals/Pain Pain Assessment Pain Assessment: Faces Faces Pain Scale: Hurts even more Pain Location: L hip Pain Descriptors / Indicators: Discomfort, Grimacing, Guarding Pain Intervention(s): Monitored during session, Repositioned, Limited activity within patient's tolerance    Home Living Family/patient expects to be  discharged to:: Private residence Living Arrangements: Other relatives (niece) Available Help at Discharge: Family;Available 24 hours/day Type of Home: Apartment Home Access: Level entry       Home Layout: One level Home Equipment: None      Prior Function Prior Level of Function : Independent/Modified Independent;Driving              Mobility Comments: works as Psychologist, occupational at [employer]        Extremity/Trunk Assessment   Upper Extremity Assessment Upper Extremity Assessment: Defer to OT evaluation    Lower Extremity Assessment Lower Extremity Assessment: Generalized weakness    Cervical / Trunk Assessment Cervical / Trunk Assessment: Kyphotic  Communication   Communication: No difficulties  Cognition Arousal/Alertness: Awake/alert Behavior During Therapy: WFL for tasks assessed/performed Overall Cognitive Status: Within Functional Limits for tasks assessed                                          General Comments      Exercises     Assessment/Plan    PT Assessment Patient needs continued PT services  PT Problem List Decreased strength;Decreased activity tolerance;Decreased balance;Decreased mobility       PT Treatment Interventions DME instruction;Gait training;Functional mobility training;Therapeutic activities;Therapeutic exercise;Balance training;Patient/family education    PT Goals (Current goals can be found in the Care Plan section)  Acute Rehab PT Goals Patient Stated Goal: to get some rest PT Goal Formulation: With patient Time For Goal Achievement: 12/22/21 Potential to Achieve Goals: Good    Frequency Min 4X/week     Co-evaluation               AM-PAC PT "6 Clicks" Mobility  Outcome Measure Help needed turning from your back to your side while in a flat bed without using bedrails?: A Little Help needed moving from lying on your back to sitting on the side of a flat bed without using bedrails?: A Little Help needed moving to and from a bed to a chair (including a wheelchair)?: A Little Help needed standing up from a chair using your arms (e.g., wheelchair or bedside chair)?: A Little Help needed to walk in hospital room?: A Little Help needed climbing 3-5 steps with a railing? : A Little 6 Click Score: 18    End of Session  Equipment Utilized During Treatment: Gait belt Activity Tolerance: Patient tolerated treatment well Patient left: in chair;with call bell/phone within reach;with chair alarm set Nurse Communication: Mobility status PT Visit Diagnosis: Unsteadiness on feet (R26.81);Muscle weakness (generalized) (M62.81);History of falling (Z91.81);Difficulty in walking, not elsewhere classified (R26.2)    Time: 1505-6979 PT Time Calculation (min) (ACUTE ONLY): 24 min   Charges:   PT Evaluation $PT Eval Low Complexity: 1 Low PT Treatments $Therapeutic Activity: 8-22 mins        Kimara Bencomo A. Gilford Rile PT, DPT Acute Rehabilitation Services Office [phone]   Linna Hoff 12/08/2021, 9:58 AM

## 2021-12-08 NOTE — Progress Notes (Signed)
ANTICOAGULATION CONSULT NOTE - Initial Consult  Pharmacy Consult for Warfarin  Indication: MVR  Allergies  Allergen Reactions   Ciprofloxacin     Heartburn   Sulfa Drugs Cross Reactors     Patient Measurements: Height: '5\' 11"'$  (180.3 cm) Weight: 74.8 kg (165 lb) IBW/kg (Calculated) : 75.3  Vital Signs: Temp: 97.9 F (36.6 C) (07/29 0327) BP: 119/95 (07/29 0327) Pulse Rate: 84 (07/29 0327)  Labs: Recent Labs    12/07/21 1355 12/07/21 2248 12/08/21 0328  HGB 13.5 12.7* 11.5*  HCT 41.5 39.2 35.3*  PLT 177 182 165  LABPROT 35.0*  --  32.4*  INR 3.5*  --  3.2*  CREATININE 0.98  --   --      Estimated Creatinine Clearance: 59.4 mL/min (by C-G formula based on SCr of 0.98 mg/dL).   Medical History: Past Medical History:  Diagnosis Date   Benign prostatic hypertrophy    Dyslipidemia    Hyperlipidemia    Long-term (current) use of anticoagulants    Mitral valve prolapse    Osteoporosis    Ventricular tachycardia [facility])     [name], INR is 3.5, pt in the ED s/p fall with significant skin tears that took multiple hours to stop bleeding. Periorbital hematoma and left lateral thigh hematoma noted. No bleeding on CT head.   7/29: INR 3.2, Hgb 11.5 (down from 12.7). No bleeding noted this morning per RN.   [name], [name]   Goal of Therapy:  INR 2.5-3.5 Monitor platelets by anticoagulation protocol: Yes   Plan:  Warfarin 5 mg PO x1 per PTA regimen  Daily CBC/INR  Monitor for s/sx of bleeding   Adria Dill, PharmD PGY-2 Infectious Diseases Resident  12/08/2021 8:57 AM

## 2021-12-09 ENCOUNTER — Inpatient Hospital Stay (HOSPITAL_COMMUNITY): Payer: PRIVATE HEALTH INSURANCE

## 2021-12-09 DIAGNOSIS — S0012XA Contusion of left eyelid and periocular area, initial encounter: Secondary | ICD-10-CM | POA: Diagnosis not present

## 2021-12-09 DIAGNOSIS — Z66 Do not resuscitate: Secondary | ICD-10-CM | POA: Diagnosis not present

## 2021-12-09 DIAGNOSIS — S7012XA Contusion of left thigh, initial encounter: Secondary | ICD-10-CM | POA: Diagnosis present

## 2021-12-09 DIAGNOSIS — Z882 Allergy status to sulfonamides status: Secondary | ICD-10-CM | POA: Diagnosis not present

## 2021-12-09 DIAGNOSIS — M810 Age-related osteoporosis without current pathological fracture: Secondary | ICD-10-CM | POA: Diagnosis not present

## 2021-12-09 DIAGNOSIS — S80212A Abrasion, left knee, initial encounter: Secondary | ICD-10-CM | POA: Diagnosis present

## 2021-12-09 DIAGNOSIS — S51012A Laceration without foreign body of left elbow, initial encounter: Secondary | ICD-10-CM | POA: Diagnosis not present

## 2021-12-09 DIAGNOSIS — Z952 Presence of prosthetic heart valve: Secondary | ICD-10-CM | POA: Diagnosis not present

## 2021-12-09 DIAGNOSIS — K5900 Constipation, unspecified: Secondary | ICD-10-CM | POA: Diagnosis not present

## 2021-12-09 DIAGNOSIS — E785 Hyperlipidemia, unspecified: Secondary | ICD-10-CM | POA: Diagnosis not present

## 2021-12-09 DIAGNOSIS — H1132 Conjunctival hemorrhage, left eye: Secondary | ICD-10-CM | POA: Diagnosis not present

## 2021-12-09 DIAGNOSIS — Z79899 Other long term (current) drug therapy: Secondary | ICD-10-CM | POA: Diagnosis not present

## 2021-12-09 DIAGNOSIS — Z7901 Long term (current) use of anticoagulants: Secondary | ICD-10-CM | POA: Diagnosis not present

## 2021-12-09 DIAGNOSIS — Z602 Problems related to living alone: Secondary | ICD-10-CM | POA: Diagnosis present

## 2021-12-09 DIAGNOSIS — S41102A Unspecified open wound of left upper arm, initial encounter: Secondary | ICD-10-CM | POA: Diagnosis not present

## 2021-12-09 DIAGNOSIS — N400 Enlarged prostate without lower urinary tract symptoms: Secondary | ICD-10-CM | POA: Diagnosis not present

## 2021-12-09 DIAGNOSIS — I10 Essential (primary) hypertension: Secondary | ICD-10-CM | POA: Diagnosis not present

## 2021-12-09 DIAGNOSIS — Y92481 Parking lot as the place of occurrence of the external cause: Secondary | ICD-10-CM | POA: Diagnosis not present

## 2021-12-09 DIAGNOSIS — W010XXA Fall on same level from slipping, tripping and stumbling without subsequent striking against object, initial encounter: Secondary | ICD-10-CM | POA: Diagnosis present

## 2021-12-09 DIAGNOSIS — R54 Age-related physical debility: Secondary | ICD-10-CM | POA: Diagnosis not present

## 2021-12-09 DIAGNOSIS — Z955 Presence of coronary angioplasty implant and graft: Secondary | ICD-10-CM | POA: Diagnosis not present

## 2021-12-09 DIAGNOSIS — Z7982 Long term (current) use of aspirin: Secondary | ICD-10-CM | POA: Diagnosis not present

## 2021-12-09 DIAGNOSIS — D62 Acute posthemorrhagic anemia: Secondary | ICD-10-CM | POA: Diagnosis not present

## 2021-12-09 DIAGNOSIS — R55 Syncope and collapse: Secondary | ICD-10-CM | POA: Diagnosis present

## 2021-12-09 DIAGNOSIS — Z881 Allergy status to other antibiotic agents status: Secondary | ICD-10-CM | POA: Diagnosis not present

## 2021-12-09 DIAGNOSIS — I951 Orthostatic hypotension: Secondary | ICD-10-CM | POA: Diagnosis not present

## 2021-12-09 LAB — CBC
HCT: 30.8 % — ABNORMAL LOW (ref 39.0–52.0)
Hemoglobin: 10.4 g/dL — ABNORMAL LOW (ref 13.0–17.0)
MCH: 32.7 pg (ref 26.0–34.0)
MCHC: 33.8 g/dL (ref 30.0–36.0)
MCV: 96.9 fL (ref 80.0–100.0)
Platelets: 172 10*3/uL (ref 150–400)
RBC: 3.18 MIL/uL — ABNORMAL LOW (ref 4.22–5.81)
RDW: 13.1 % (ref 11.5–15.5)
WBC: 10.1 10*3/uL (ref 4.0–10.5)
nRBC: 0 % (ref 0.0–0.2)

## 2021-12-09 LAB — PROTIME-INR
INR: 3.4 — ABNORMAL HIGH (ref 0.8–1.2)
Prothrombin Time: 34.2 seconds — ABNORMAL HIGH (ref 11.4–15.2)

## 2021-12-09 MED ORDER — ACETAMINOPHEN 500 MG PO TABS
500.0000 mg | ORAL_TABLET | Freq: Four times a day (QID) | ORAL | Status: DC | PRN
  Administered 2021-12-09 – 2021-12-23 (×15): 500 mg via ORAL
  Filled 2021-12-09 (×16): qty 1

## 2021-12-09 MED ORDER — METOPROLOL TARTRATE 25 MG PO TABS
25.0000 mg | ORAL_TABLET | Freq: Two times a day (BID) | ORAL | Status: DC
  Administered 2021-12-09: 25 mg via ORAL
  Filled 2021-12-09 (×2): qty 1

## 2021-12-09 MED ORDER — WARFARIN SODIUM 2.5 MG PO TABS
2.5000 mg | ORAL_TABLET | Freq: Once | ORAL | Status: AC
Start: 2021-12-09 — End: 2021-12-09
  Administered 2021-12-09: 2.5 mg via ORAL
  Filled 2021-12-09: qty 1

## 2021-12-09 MED ORDER — LACTATED RINGERS IV SOLN: INTRAVENOUS | Status: AC

## 2021-12-09 MED ORDER — FUROSEMIDE 10 MG/ML IJ SOLN
20.0000 mg | Freq: Once | INTRAMUSCULAR | Status: AC
  Administered 2021-12-09: 20 mg via INTRAVENOUS
  Filled 2021-12-09: qty 4

## 2021-12-09 NOTE — Progress Notes (Addendum)
PROGRESS NOTE        PATIENT DETAILS Name: RYSHAWN SANZONE Age: 86 y.o. Sex: male Date of Birth: 04/02/1936 Admit Date: 12/07/2021 Admitting Physician Orene Desanctis, DO TKW:[id number], [name], MD  Brief Summary: Patient is a 86 y.o.  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  See below for further details.   Significant events: 7/28>> fall at [facility] tears on bilateral arms/face-s/p suturing/combat gauze/pressure-subsequently when attempting to ambulate-he syncopized.  Admit to [facility]. 7/29>> orthostatic vital signs positive  Significant studies: 7/28>> x-ray left forearm: No fracture/dislocation.  Soft tissue swelling over the dorsum. 7/28>> x-ray right hand: No fracture. 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 7/28>> CT maxillofacial: Left periorbital soft tissue swelling without acute fracture.  7/29 >>  MRI - 1. No acute intracranial abnormality. 2. Small left periorbital/facial contusion. 3. Chronic encephalomalacia of with hemosiderin staining at the right occipital lobe, consistent with remote hemorrhagic infarct and/or bleed at this location. 4. Generalized age-related cerebral atrophy with chronic small vessel ischemic disease, with a few scattered remote lacunar infarcts involving the pons/midbrain and cerebellum. Overall, changes are progressed as compared to 2017. 5. Multiple scattered chronic micro hemorrhages involving both cerebral and cerebellar hemispheres. Findings are nonspecific, and could be related to chronic underlying hypertension or possibly cerebral amyloid  angiopathy  7/29 >> TTE -  1. Study is not well visualized. Left ventricular ejection fraction, by estimation, is 60 to 65%. The left ventricle has normal function. The left ventricle has no regional wall motion abnormalities. Left ventricular diastolic parameters are consistent with Grade I diastolic dysfunction (impaired relaxation).  2. Right ventricular systolic function is normal. The right ventricular size is not well visualized. Tricuspid regurgitation signal is inadequate for assessing PA pressure.  3. S/p mechanical MVR 2003. MV mean gradient 4 mmHg at HR 92 bpm. No significant stenosis; normal prosthesis. No evidence of mitral valve regurgitation.  4. The aortic valve was not well visualized. Aortic valve regurgitation is not visualized. No aortic stenosis is present.  5. The inferior vena cava is normal in size with greater than 50% respiratory variability, suggesting right atrial pressure of 3 mmHg  Significant microbiology data: None  Procedures: None  Consults:  None  Subjective:  Sitting in recliner trying to eat breakfast, denies any headache chest or abdominal pain, overall feels extremely weak and frail, he lives by himself and not confident that he can take care of himself at home with large left periorbital ecchymosis, had bandage and multiple bandages in upper extremities from fall related wounds.   Objective: Vitals: Blood pressure 116/70, pulse 97, temperature 97.7 F (36.5 C), temperature source Oral, resp. rate 16, height '5\' 11"'$  (1.803 m), weight 74.8 kg, SpO2 98 %.   Exam:  Alert awake-not in any distress.  Left periorbital ecchymosis present.  Numerous bandages present-did not open. Supple Neck, No JVD,   Symmetrical Chest wall movement, Good air movement bilaterally, CTAB RRR,No Gallops, Rubs or new Murmurs,  +ve B.Sounds, Abd Soft, No tenderness,   No Cyanosis,  Clubbing or edema   Assessment/Plan:  Syncope: Likely orthostatic hypotension-sustained a fall  at home and incurred severe left periorbital bruise and laceration, multiple wounds in upper extremities requiring bandages, lives by himself, also on Coumadin with high INR.  Overall weak and frail do not think he is safe for discharge by himself to home at this point, continue PT OT, advance activity, echo MRI nonacute.  Continue to monitor on telemetry, still orthostatic, gentle hydration with IV fluids.  Mechanical fall with soft tissue injury involving left pelvic periorbital area/bilateral upper extremities: Supportive care-mobilize with PT/OT.  Per patient he is updated with his tetanus vaccine.  Note-he required suture to his left forearm small arterial bleeding.  Low-density changes seen on occipital lobe on CT imaging: MRI brain nonacute.  Mechanical mitral valve replacement: On Coumadin-being managed by pharmacy.  Goal INR 2.5-3.5  HTN: Hold all antihypertensives for now.  Recheck orthostatic vital signs tomorrow before resuming.  HLD: On statin  BMI: Estimated body mass index is 23.01 kg/m as calculated from the following:   Height as of this encounter: '5\' 11"'$  (1.803 m).   Weight as of this encounter: 74.8 kg.   Code status:   Code Status: DNR   DVT Prophylaxis:Place TED hose Start: 12/08/21 1008    Family Communication: None at bedside   Disposition Plan: Status is: Observation The patient will require care spanning > 2 midnights and should be moved to inpatient because: Ongoing orthostatic hypotension-continuing gentle hydration for a few more hours-holding all antihypertensives-not yet stable for discharge.   Planned Discharge Destination:Home likely on 7/30   Diet: Diet Order             Diet regular Room service appropriate? Yes; Fluid consistency: Thin  Diet effective now                   MEDICATIONS: Scheduled Meds:  rosuvastatin  20 mg Oral Daily   Warfarin - Pharmacist Dosing Inpatient   Does not apply q1600   Continuous Infusions:   PRN  Meds:.   I have personally reviewed following labs and imaging studies  LABORATORY DATA:  Recent Labs  Lab 12/07/21 1355 12/07/21 2248 12/08/21 0328  WBC 6.6 12.6* 11.7*  HGB 13.5 12.7* 11.5*  HCT 41.5 39.2 35.3*  PLT 177 182 165  MCV 97.6 98.5 97.0  MCH 31.8 31.9 31.6  MCHC 32.5 32.4 32.6  RDW 12.9 12.9 12.9  LYMPHSABS  --  1.5  --   MONOABS  --  1.4*  --   EOSABS  --  0.0  --   BASOSABS  --  0.0  --     Recent Labs  Lab 12/07/21 1355 12/08/21 0328  NA 141  --   K 3.9  --   CL 106  --   CO2 26  --   GLUCOSE 139*  --   BUN 22  --   CREATININE 0.98  --   CALCIUM 8.7*  --   INR 3.5* 3.2*    RADIOLOGY STUDIES/RESULTS: MR BRAIN WO CONTRAST  Result Date: 12/08/2021 CLINICAL DATA:  Initial evaluation for acute dizziness. EXAM: MRI HEAD WITHOUT CONTRAST TECHNIQUE: Multiplanar, multiecho pulse sequences of the brain and surrounding structures were obtained without intravenous contrast. COMPARISON:  Prior head CT from 12/07/2021 as well as brain MRI from 06/19/2015. FINDINGS: Brain: Generalized age-related cerebral atrophy. Scattered patchy T2/FLAIR hyperintensity involving the periventricular deep white matter both cerebral hemispheres as well as the pons, most consistent with  chronic small vessel ischemic disease. Changes are progressed as compared to 2017. Few scattered superimposed remote lacunar infarcts present about the pons/midbrain and cerebellum. Encephalomalacia and gliosis involving the right occipital lobe with associated susceptibility artifact, consistent with prior hemorrhagic infarction or bleed at this location. Additionally, there are multiple scattered chronic micro hemorrhages involving both cerebral and cerebellar hemispheres. While these could be hypertensive/small vessel related, possible cerebral amyloid angiopathy could also be considered. No acute or subacute intracranial infarct. Gray-white matter differentiation maintained. No acute intracranial  hemorrhage. No mass lesion, midline shift or mass effect. No hydrocephalus or extra-axial fluid collection. Pituitary gland grossly within normal limits. Vascular: Major intracranial vascular flow voids are maintained. Skull and upper cervical spine: Craniocervical junction within normal limits. Bone marrow signal intensity within normal limits. Small left periorbital/facial contusion noted. Sinuses/Orbits: Globes and orbital soft tissues demonstrate no acute finding. Mild scattered mucosal thickening about the ethmoidal air cells. Paranasal sinuses are otherwise clear. Trace right mastoid effusion noted from about significance. Other: None. IMPRESSION: 1. No acute intracranial abnormality. 2. Small left periorbital/facial contusion. 3. Chronic encephalomalacia of with hemosiderin staining at the right occipital lobe, consistent with remote hemorrhagic infarct and/or bleed at this location. 4. Generalized age-related cerebral atrophy with chronic small vessel ischemic disease, with a few scattered remote lacunar infarcts involving the pons/midbrain and cerebellum. Overall, changes are progressed as compared to 2017. 5. Multiple scattered chronic micro hemorrhages involving both cerebral and cerebellar hemispheres. Findings are nonspecific, and could be related to chronic underlying hypertension or possibly cerebral amyloid angiopathy. Electronically Signed   By: Jeannine Boga M.D.   On: 12/08/2021 20:01   ECHOCARDIOGRAM COMPLETE  Result Date: 12/08/2021    ECHOCARDIOGRAM REPORT   Patient Name:   BRANDAN ROBICHEAUX Date of Exam: 12/08/2021 Medical Rec #:  [id number]          Height:       71.0 in Accession #:    [id number]         Weight:       165.0 lb Date of Birth:  January 15, 1936           BSA:          1.943 m Patient Age:    68 years           BP:           124/69 mmHg Patient Gender: M                  HR:           92 bpm. Exam Location:  Inpatient Procedure: 2D Echo, Color Doppler, Cardiac Doppler and  Intracardiac            Opacification Agent Indications:    R55 Syncope  History:        Patient has prior history of Echocardiogram examinations, most                 recent 10/17/2006. Risk Factors:Dyslipidemia. Mechanical MVR June                 2003.  Sonographer:    Raquel Sarna Senior RDCS Referring Phys: [id number] [place]  1. Study is not well visualized. Left ventricular ejection fraction, by estimation, is 60 to 65%. The left ventricle has normal function. The left ventricle has no regional wall motion abnormalities. Left ventricular diastolic parameters are consistent with Grade I diastolic dysfunction (impaired relaxation).  2. Right ventricular systolic function is normal.  The right ventricular size is not well visualized. Tricuspid regurgitation signal is inadequate for assessing PA pressure.  3. S/p mechanical MVR 2003. MV mean gradient 4 mmHg at HR 92 bpm. No significant stenosis; normal prosthesis. No evidence of mitral valve regurgitation.  4. The aortic valve was not well visualized. Aortic valve regurgitation is not visualized. No aortic stenosis is present.  5. The inferior vena cava is normal in size with greater than 50% respiratory variability, suggesting right atrial pressure of 3 mmHg. Comparison(s): No prior Echocardiogram. Conclusion(s)/Recommendation(s): Normal biventricular function without evidence of hemodynamically significant valvular heart disease. FINDINGS  Left Ventricle: Study is not well visualized. Left ventricular ejection fraction, by estimation, is 60 to 65%. The left ventricle has normal function. The left ventricle has no regional wall motion abnormalities. Definity contrast agent was given IV to delineate the left ventricular endocardial borders. The left ventricular internal cavity size was normal in size. Left ventricular diastolic parameters are consistent with Grade I diastolic dysfunction (impaired relaxation). Right Ventricle: The right ventricular size is not  well visualized. Right ventricular systolic function is normal. Tricuspid regurgitation signal is inadequate for assessing PA pressure. Left Atrium: Left atrial size was normal in size. Right Atrium: Right atrial size was normal in size. Pericardium: There is no evidence of pericardial effusion. Mitral Valve: S/p mechanical MVR 2003. MV mean gradient 4 mmHg at HR 92 bpm. No significant stenosis; normal prosthesis. No evidence of mitral valve regurgitation. MV peak gradient, 7.8 mmHg. The mean mitral valve gradient is 4.5 mmHg. Tricuspid Valve: Tricuspid valve regurgitation is not demonstrated. Aortic Valve: The aortic valve was not well visualized. Aortic valve regurgitation is not visualized. No aortic stenosis is present. Pulmonic Valve: The pulmonic valve was not well visualized. Pulmonic valve regurgitation is not visualized. Aorta: The aortic root and ascending aorta are structurally normal, with no evidence of dilitation. Venous: The inferior vena cava is normal in size with greater than 50% respiratory variability, suggesting right atrial pressure of 3 mmHg. IAS/Shunts: No atrial level shunt detected by color flow Doppler.  LEFT VENTRICLE PLAX 2D LVOT diam:     2.00 cm   Diastology LV SV:         52        LV e' medial:    6.53 cm/s LV SV Index:   27        LV E/e' medial:  13.2 LVOT Area:     3.14 cm  LV e' lateral:   8.05 cm/s                          LV E/e' lateral: 10.7  RIGHT VENTRICLE RV S prime:     10.20 cm/s TAPSE (M-mode): 1.9 cm LEFT ATRIUM           Index LA Vol (A4C): 42.8 ml 22.03 ml/m  AORTIC VALVE LVOT Vmax:   95.30 cm/s LVOT Vmean:  67.200 cm/s LVOT VTI:    0.165 m  AORTA Ao Root diam: 3.50 cm Ao Asc diam:  3.50 cm MITRAL VALVE MV Area (PHT): 4.31 cm     SHUNTS MV Area VTI:   2.10 cm     Systemic VTI:  0.16 m MV Peak grad:  7.8 mmHg     Systemic Diam: 2.00 cm MV Mean grad:  4.5 mmHg MV Vmax:       1.40 m/s MV Vmean:      100.9 cm/s MV Decel Time: 176 msec  MV E velocity: 85.90 cm/s MV A  velocity: 111.00 cm/s MV E/A ratio:  0.77 Phineas Inches Electronically signed by Phineas Inches Signature Date/Time: 12/08/2021/2:27:52 PM    Final    CT HEAD WO CONTRAST (5MM)  Result Date: 12/07/2021 CLINICAL DATA:  Fall.  Left eye pain.  Head trauma EXAM: CT HEAD WITHOUT CONTRAST CT MAXILLOFACIAL WITHOUT CONTRAST TECHNIQUE: Multidetector CT imaging of the head and maxillofacial structures were performed using the standard protocol without intravenous contrast. Multiplanar CT image reconstructions of the maxillofacial structures were also generated. RADIATION DOSE REDUCTION: This exam was performed according to the departmental dose-optimization program which includes automated exposure control, adjustment of the mA and/or kV according to patient size and/or use of iterative reconstruction technique. 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 Elsewhere, no evidence of an acute infarction. No evidence of hemorrhage, hydrocephalus, extra-axial collection or mass lesion/mass effect. Scattered low-density changes within the periventricular and subcortical white matter compatible with chronic microvascular ischemic change. Mild diffuse cerebral volume loss. Vascular: Atherosclerotic calcifications involving the large vessels of the skull base. No unexpected hyperdense vessel. Skull: Normal. Negative for fracture or focal lesion. Other: Negative for scalp hematoma. CT MAXILLOFACIAL FINDINGS Osseous: No acute maxillofacial bone fracture. Bony orbital walls are intact. Mandible intact. Temporomandibular joints are aligned without dislocation. Degenerative changes of the temporomandibular joints. Orbits: Negative. No traumatic or inflammatory finding. Sinuses: Clear. Soft tissues: Left periorbital soft tissue swelling. IMPRESSION: 1. Low-density changes within the right occipital lobe likely late subacute to chronic  infarct. Consider dedicated MRI to further characterize. 2. Otherwise, no acute intracranial findings. 3. Left periorbital soft tissue swelling without acute maxillofacial bone fracture. Electronically Signed   By: Davina Poke D.O.   On: 12/07/2021 13:59   CT Maxillofacial Wo Contrast  Result Date: 12/07/2021 CLINICAL DATA:  Fall.  Left eye pain.  Head trauma EXAM: CT HEAD WITHOUT CONTRAST CT MAXILLOFACIAL WITHOUT CONTRAST TECHNIQUE: Multidetector CT imaging of the head and maxillofacial structures were performed using the standard protocol without intravenous contrast. Multiplanar CT image reconstructions of the maxillofacial structures were also generated. RADIATION DOSE REDUCTION: This exam was performed according to the departmental dose-optimization program which includes automated exposure control, adjustment of the mA and/or kV according to patient size and/or use of iterative reconstruction technique. 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 Elsewhere, no evidence of an acute infarction. No evidence of hemorrhage, hydrocephalus, extra-axial collection or mass lesion/mass effect. Scattered low-density changes within the periventricular and subcortical white matter compatible with chronic microvascular ischemic change. Mild diffuse cerebral volume loss. Vascular: Atherosclerotic calcifications involving the large vessels of the skull base. No unexpected hyperdense vessel. Skull: Normal. Negative for fracture or focal lesion. Other: Negative for scalp hematoma. CT MAXILLOFACIAL FINDINGS Osseous: No acute maxillofacial bone fracture. Bony orbital walls are intact. Mandible intact. Temporomandibular joints are aligned without dislocation. Degenerative changes of the temporomandibular joints. Orbits: Negative. No traumatic or inflammatory finding. Sinuses: Clear. Soft tissues: Left periorbital  soft tissue swelling. IMPRESSION: 1. 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 Consider dedicated MRI to further characterize. 2. Otherwise, no acute intracranial findings. 3. Left periorbital soft tissue swelling without acute maxillofacial bone fracture. Electronically Signed   By: Davina Poke D.O.   On: 12/07/2021 13:59   DG Forearm Left  Result Date: 12/07/2021  CLINICAL DATA:  Fall, left forearm pain EXAM: LEFT FOREARM - 2 VIEW COMPARISON:  None Available. FINDINGS: There is no evidence of fracture or dislocation. Soft tissue swelling over the dorsum of the forearm. IMPRESSION: No acute fracture or dislocation. Soft tissue swelling over the dorsum of the forearm. Electronically Signed   By: Davina Poke D.O.   On: 12/07/2021 13:42   DG Hand Complete Right  Result Date: 12/07/2021 CLINICAL DATA:  Fall.  Right hand laceration EXAM: RIGHT HAND - COMPLETE 3+ VIEW COMPARISON:  None Available. FINDINGS: No acute fracture. No dislocation. Severe arthropathy of the first [facility] joint. Mild-to-moderate degenerative changes throughout the rest of the hand. Mild soft tissue swelling. Scattered atherosclerotic vascular calcifications. IMPRESSION: 1. No acute osseous abnormality of the right hand. 2. Degenerative changes of the hand, severe at the first [facility] joint. Electronically Signed   By: Davina Poke D.O.   On: 12/07/2021 13:41     LOS: 0 days   Signature  Lala Lund M.D on 12/09/2021 at 8:39 AM   -  To page go to [email or web address]

## 2021-12-09 NOTE — Progress Notes (Signed)
Physical Therapy Treatment Patient Details Name: Terry [name] MRN: [id number] DOB: 10/06/1935 Today's Date: 12/09/2021   History of Present Illness 86 y/o male presented to ED on 12/07/21 after fall in parking lot. Had a syncopal event in ED. 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 PMH: mitral valve replacement, HTN    PT Comments    Pt received in recliner, pleasant and eager to participate in therapy. He required min assist sit to stand, and min guard assist ambulation 150' with RW. RW needed due to L hip pain. Discharge recommendation updated to SNF due to pt lives alone.     Recommendations for follow up therapy are one component of a multi-disciplinary discharge planning process, led by the attending physician.  Recommendations may be updated based on patient status, additional functional criteria and insurance authorization.  Follow Up Recommendations  Skilled nursing-short term rehab (<3 hours/day) Can patient physically be transported by private vehicle: Yes   Assistance Recommended at Discharge Frequent or constant Supervision/Assistance  Patient can return home with the following A little help with walking and/or transfers;A lot of help with bathing/dressing/bathroom;Assistance with cooking/housework;Assist for transportation;Assistance with feeding   Equipment Recommendations  Rolling walker (2 wheels)    Recommendations for Other Services       Precautions / Restrictions Precautions Precautions: Fall;Other (comment) Precaution Comments: watch BP (+orthostatics but assymptomatic)     Mobility  Bed Mobility               General bed mobility comments: Up in recliner    Transfers Overall transfer level: Needs assistance Equipment used: Rolling walker (2 wheels) Transfers: Sit to/from Stand Sit to Stand: Min assist           General transfer comment: increased time    Ambulation/Gait Ambulation/Gait assistance: Min guard Gait  Distance (Feet): 150 Feet Assistive device: Rolling walker (2 wheels) Gait Pattern/deviations: Step-through pattern, Decreased stride length Gait velocity: decreased Gait velocity interpretation: <1.31 ft/sec, indicative of household ambulator   General Gait Details: slow, steady gait. No c/o dizziness   Stairs             Wheelchair Mobility    Modified Rankin (Stroke Patients Only)       Balance Overall balance assessment: Needs assistance Sitting-balance support: No upper extremity supported, Feet supported Sitting balance-Leahy Scale: Good     Standing balance support: Bilateral upper extremity supported, During functional activity, No upper extremity supported Standing balance-Leahy Scale: Fair Standing balance comment: RW for amb due to L hip pain                            Cognition Arousal/Alertness: Awake/alert Behavior During Therapy: WFL for tasks assessed/performed Overall Cognitive Status: Within Functional Limits for tasks assessed                                          Exercises      General Comments        Pertinent Vitals/Pain Pain Assessment Pain Assessment: Faces Faces Pain Scale: Hurts even more Pain Location: L hip Pain Descriptors / Indicators: Discomfort, Grimacing, Guarding Pain Intervention(s): Limited activity within patient's tolerance, Monitored during session    Home Living  Prior Function            PT Goals (current goals can now be found in the care plan section) Acute Rehab PT Goals Patient Stated 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 Progress towards PT goals: Progressing toward goals    Frequency    Min 3X/week      PT Plan Frequency needs to be updated    Co-evaluation              AM-PAC PT "6 Clicks" Mobility   Outcome Measure  Help needed turning from your back to your side while in a flat bed without using bedrails?: A  Little Help needed moving from lying on your back to sitting on the side of a flat bed without using bedrails?: A Little Help needed moving to and from a bed to a chair (including a wheelchair)?: A Little Help needed standing up from a chair using your arms (e.g., wheelchair or bedside chair)?: A Little Help needed to walk in hospital room?: A Little Help needed climbing 3-5 steps with a railing? : A Lot 6 Click Score: 17    End of Session Equipment Utilized During Treatment: Gait belt Activity Tolerance: Patient tolerated treatment well Patient left: in chair;with call bell/phone within reach Nurse Communication: Mobility status PT Visit Diagnosis: Unsteadiness on feet (R26.81);Muscle weakness (generalized) (M62.81);History of falling (Z91.81);Difficulty in walking, not elsewhere classified (R26.2)     Time: 0932-6712 PT Time Calculation (min) (ACUTE ONLY): 23 min  Charges:  $Gait Training: 23-37 mins                     Terry [name], [place]  Office # [phone] Pager [phone]    Terry [name] 12/09/2021, 9:38 AM

## 2021-12-09 NOTE — Progress Notes (Signed)
Occupational Therapy Treatment Patient Details Name: Terry [name] MRN: [id number] DOB: [birth year] Today's Date: 12/09/2021   History of present illness 86 y/o male presented to ED on 12/07/21 after fall in parking lot. Had a syncopal event in ED. 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 PMH: mitral valve replacement, HTN   OT comments  Pt progressing towards goals, completing standing ADLs and transfers with min guard-min A this session using RW, presents with impaired fine motor coordination skills due to bil hand bandaging. Administered built up handle to assist with self feeding and grooming tasks, pt verbalized understanding of use. Pt presenting with impairments listed below, will follow acutely. Updating d/c recommendation to SNF.   Recommendations for follow up therapy are one component of a multi-disciplinary discharge planning process, led by the attending physician.  Recommendations may be updated based on patient status, additional functional criteria and insurance authorization.    Follow Up Recommendations  Skilled nursing-short term rehab (<3 hours/day)    Assistance Recommended at Discharge Intermittent Supervision/Assistance  Patient can return home with the following  A little help with walking and/or transfers;Assistance with cooking/housework;Direct supervision/assist for medications management;Direct supervision/assist for financial management;Assist for transportation;Help with stairs or ramp for entrance;A lot of help with bathing/dressing/bathroom   Equipment Recommendations  None recommended by OT;Other (comment) (defer)    Recommendations for Other Services PT consult    Precautions / Restrictions Precautions Precautions: Fall;Other (comment) Precaution Comments: watch BP (+orthostatics but assymptomatic) Restrictions Weight Bearing Restrictions: No       Mobility Bed Mobility               General bed mobility comments: Up in  recliner    Transfers Overall transfer level: Needs assistance Equipment used: Rolling walker (2 wheels) Transfers: Sit to/from Stand Sit to Stand: Min assist           General transfer comment: increased time     Balance                                           ADL either performed or assessed with clinical judgement   ADL Overall ADL's : Needs assistance/impaired     Grooming: Minimal assistance;Standing Grooming Details (indicate cue type and reason): oral care in standing                 Toilet Transfer: Min guard;Comfort height toilet;Ambulation;Rolling walker (2 wheels) Toilet Transfer Details (indicate cue type and reason): simulated in room         Functional mobility during ADLs: Min guard;Rolling walker (2 wheels)      Extremity/Trunk Assessment Upper Extremity Assessment Upper Extremity Assessment:  (impaired fine motor skills, bil hands bandaged, L elbow bandaged)   Lower Extremity Assessment Lower Extremity Assessment: Defer to PT evaluation        Vision   Additional Comments: pt with noted bruising/blood in L eye, vision [facility]   Perception Perception Perception: Not tested   Praxis Praxis Praxis: Not tested    Cognition Arousal/Alertness: Awake/alert Behavior During Therapy: WFL for tasks assessed/performed Overall Cognitive Status: Within Functional Limits for tasks assessed                                          Exercises  Shoulder Instructions       General Comments BP 119/66 (81) HR 105 post ambulation    Pertinent Vitals/ Pain       Pain Assessment Pain Assessment: Faces Pain Score: 6  Faces Pain Scale: Hurts even more Pain Location: L hip Pain Descriptors / Indicators: Discomfort, Grimacing, Guarding Pain Intervention(s): Limited activity within patient's tolerance, Monitored during session, Repositioned  Home Living                                           Prior Functioning/Environment              Frequency  Min 2X/week        Progress Toward Goals  OT Goals(current goals can now be found in the care plan section)  Progress towards OT goals: Progressing toward goals  Acute Rehab OT Goals Patient Stated Goal: to go to rehab OT Goal Formulation: With patient Time For Goal Achievement: 12/22/21 Potential to Achieve Goals: Good ADL Goals Pt Will Perform Upper Body Dressing: with modified independence;sitting Pt Will Perform Lower Body Dressing: with modified independence;sitting/lateral leans;sit to/from stand Pt Will Transfer to Toilet: with modified independence;regular height toilet;ambulating Pt Will Perform Tub/Shower Transfer: Shower transfer;shower seat;ambulating;with modified independence Additional ADL Goal #1: pt will complete bed mobility with mod I in prep for ADLs  Plan Discharge plan needs to be updated;Frequency remains appropriate    Co-evaluation                 AM-PAC OT "6 Clicks" Daily Activity     Outcome Measure   Help from another person eating meals?: A Little Help from another person taking care of personal grooming?: A Little Help from another person toileting, which includes using toliet, bedpan, or urinal?: A Lot Help from another person bathing (including washing, rinsing, drying)?: A Little Help from another person to put on and taking off regular upper body clothing?: A Little Help from another person to put on and taking off regular lower body clothing?: A Little 6 Click Score: 17    End of Session Equipment Utilized During Treatment: Gait belt;Rolling walker (2 wheels)  OT Visit Diagnosis: Other abnormalities of gait and mobility (R26.89);Unsteadiness on feet (R26.81);Muscle weakness (generalized) (M62.81)   Activity Tolerance Patient tolerated treatment well   Patient Left in chair;with call bell/phone within reach;with chair alarm set   Nurse Communication Mobility  status        Time: 9774-1423 OT Time Calculation (min): 16 min  Charges: OT General Charges $OT Visit: 1 Visit OT Treatments $Self Care/Home Management : 8-22 mins  Lynnda Child, OTD, OTR/L Acute Rehab [phone] Craig 12/09/2021, 10:24 AM

## 2021-12-09 NOTE — Progress Notes (Signed)
Terry [name] for Warfarin  Indication: MVR  Allergies  Allergen Reactions   Cipro [Ciprofloxacin Hcl] Other (See Comments)    Heartburn    Sulfa Drugs Cross Reactors Hives    Patient Measurements: Height: '5\' 11"'$  (180.3 cm) Weight: 74.8 kg (165 lb) IBW/kg (Calculated) : 75.3  Vital Signs: Temp: 97.6 F (36.4 C) (07/30 0500) Temp Source: Oral (07/30 0500) BP: 135/72 (07/30 0500) Pulse Rate: 95 (07/30 0500)  Labs: Recent Labs    12/07/21 1355 12/07/21 2248 12/08/21 0328  HGB 13.5 12.7* 11.5*  HCT 41.5 39.2 35.3*  PLT 177 182 165  LABPROT 35.0*  --  32.4*  INR 3.5*  --  3.2*  CREATININE 0.98  --   --      Estimated Creatinine Clearance: 59.4 mL/min (by C-G formula based on SCr of 0.98 mg/dL).   Medical History: Past Medical History:  Diagnosis Date   Benign prostatic hypertrophy    Dyslipidemia    Hyperlipidemia    Long-term (current) use of anticoagulants    Mitral valve prolapse    Osteoporosis    Ventricular tachycardia [facility])     [name], INR is 3.5, pt in the ED s/p fall with significant skin tears that took multiple hours to stop bleeding. Periorbital hematoma and left lateral thigh hematoma noted. No bleeding on CT head.   7/29: INR 3.2, Hgb 11.5>10.4 (down from 12.7). No significant bleeding noted this morning per RN (dressings not saturated).   [name], [name]   Goal of Therapy:  INR 2.5-3.5 Monitor platelets by anticoagulation protocol: Yes   Plan:  Warfarin 2.5 mg PO x1 per PTA regimen  Daily CBC/INR  Monitor for s/sx of bleeding   Adria Dill, PharmD PGY-2 Infectious Diseases Resident  12/09/2021 7:37 AM

## 2021-12-10 DIAGNOSIS — R55 Syncope and collapse: Secondary | ICD-10-CM | POA: Diagnosis not present

## 2021-12-10 LAB — CBC
HCT: 29.1 % — ABNORMAL LOW (ref 39.0–52.0)
Hemoglobin: 9.5 g/dL — ABNORMAL LOW (ref 13.0–17.0)
MCH: 31.6 pg (ref 26.0–34.0)
MCHC: 32.6 g/dL (ref 30.0–36.0)
MCV: 96.7 fL (ref 80.0–100.0)
Platelets: 162 10*3/uL (ref 150–400)
RBC: 3.01 MIL/uL — ABNORMAL LOW (ref 4.22–5.81)
RDW: 13 % (ref 11.5–15.5)
WBC: 9.5 10*3/uL (ref 4.0–10.5)
nRBC: 0 % (ref 0.0–0.2)

## 2021-12-10 LAB — PROTIME-INR
INR: 3.2 — ABNORMAL HIGH (ref 0.8–1.2)
Prothrombin Time: 32.8 seconds — ABNORMAL HIGH (ref 11.4–15.2)

## 2021-12-10 LAB — BRAIN NATRIURETIC PEPTIDE: B Natriuretic Peptide: 48 pg/mL (ref 0.0–100.0)

## 2021-12-10 LAB — BASIC METABOLIC PANEL
Anion gap: 7 (ref 5–15)
BUN: 22 mg/dL (ref 8–23)
CO2: 26 mmol/L (ref 22–32)
Calcium: 8.2 mg/dL — ABNORMAL LOW (ref 8.9–10.3)
Chloride: 105 mmol/L (ref 98–111)
Creatinine, Ser: 0.82 mg/dL (ref 0.61–1.24)
GFR, Estimated: >60 mL/min (ref >60)
Glucose, Bld: 98 mg/dL (ref 70–99)
Potassium: 3.9 mmol/L (ref 3.5–5.1)
Sodium: 138 mmol/L (ref 135–145)

## 2021-12-10 LAB — MAGNESIUM: Magnesium: 1.9 mg/dL (ref 1.7–2.4)

## 2021-12-10 MED ORDER — METOPROLOL TARTRATE 25 MG PO TABS
25.0000 mg | ORAL_TABLET | Freq: Two times a day (BID) | ORAL | Status: DC
  Administered 2021-12-10 – 2021-12-24 (×26): 25 mg via ORAL
  Filled 2021-12-10 (×28): qty 1

## 2021-12-10 MED ORDER — WARFARIN SODIUM 5 MG PO TABS
5.0000 mg | ORAL_TABLET | Freq: Once | ORAL | Status: AC
  Administered 2021-12-10: 5 mg via ORAL
  Filled 2021-12-10: qty 1

## 2021-12-10 MED ORDER — SILVER NITRATE-POT NITRATE 75-25 % EX MISC
10.0000 | CUTANEOUS | Status: DC | PRN
  Administered 2021-12-11 – 2021-12-18 (×3): 10 via TOPICAL
  Filled 2021-12-10: qty 10

## 2021-12-10 NOTE — Progress Notes (Signed)
PROGRESS NOTE        PATIENT DETAILS Name: Terry [name] Age: 86 y.o. Sex: male Date of Birth: [birth year] Admit Date: 12/07/2021 Admitting Physician Orene Desanctis, DO PQZ:RAQTMA, [name], MD  Brief Summary: Patient is a 86 y.o.  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  See below for further details.   Significant events: 7/28>> fall at [facility] tears on bilateral arms/face-s/p suturing/combat gauze/pressure-subsequently when attempting to ambulate-he syncopized.  Admit to [facility]. 7/29>> orthostatic vital signs positive  Significant studies: 7/28>> x-ray left forearm: No fracture/dislocation.  Soft tissue swelling over the dorsum. 7/28>> x-ray right hand: No fracture. 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 7/28>> CT maxillofacial: Left periorbital soft tissue swelling without acute fracture.  7/29 >>  MRI - 1. No acute intracranial abnormality. 2. Small left periorbital/facial contusion. 3. Chronic encephalomalacia of with hemosiderin staining at the right occipital lobe, consistent with remote hemorrhagic infarct and/or bleed at this location. 4. Generalized age-related cerebral atrophy with chronic small vessel ischemic disease, with a few scattered remote lacunar infarcts involving the pons/midbrain and cerebellum. Overall, changes are progressed as compared to 2017. 5. Multiple scattered chronic micro hemorrhages involving both cerebral and cerebellar hemispheres. Findings are nonspecific, and could be related to chronic underlying hypertension or possibly cerebral amyloid  angiopathy  7/29 >> TTE -  1. Study is not well visualized. Left ventricular ejection fraction, by estimation, is 60 to 65%. The left ventricle has normal function. The left ventricle has no regional wall motion abnormalities. Left ventricular diastolic parameters are consistent with Grade I diastolic dysfunction (impaired relaxation).  2. Right ventricular systolic function is normal. The right ventricular size is not well visualized. Tricuspid regurgitation signal is inadequate for assessing PA pressure.  3. S/p mechanical MVR 2003. MV mean gradient 4 mmHg at HR 92 bpm. No significant stenosis; normal prosthesis. No evidence of mitral valve regurgitation.  4. The aortic valve was not well visualized. Aortic valve regurgitation is not visualized. No aortic stenosis is present.  5. The inferior vena cava is normal in size with greater than 50% respiratory variability, suggesting right atrial pressure of 3 mmHg  Significant microbiology data: None  Procedures: None  Consults:  None  Subjective: Patient in bed, appears comfortable, denies any headache, no fever, no chest pain or pressure, no shortness of breath , no abdominal pain. No focal weakness.  Objective: Vitals: Blood pressure 110/70, pulse 99, temperature 97.9 F (36.6 C), temperature source Axillary, resp. rate 20, height '5\' 11"'$  (1.803 m), weight 74.8 kg, SpO2 99 %.   Exam:  Alert awake-not in any distress.  Left periorbital ecchymosis present.  Numerous bandages present-did not open. Terry [name],PERRAL Supple Neck, No JVD,   Symmetrical Chest wall movement, Good air movement bilaterally, CTAB RRR,No Gallops, Rubs or new Murmurs,  +ve B.Sounds, Abd Soft, No tenderness,   No Cyanosis, Clubbing or edema    Assessment/Plan:  Syncope: Likely orthostatic hypotension-sustained a fall at home and incurred severe left periorbital bruise and laceration, multiple wounds in upper  extremities requiring bandages, lives by himself, also on  Coumadin with high INR.  Overall weak and frail do not think he is safe for discharge by himself to home at this point, continue PT OT, advance activity, echo MRI nonacute.  Orthostatics have improved, stable on telemetry, looking for SNF bed.  Mechanical fall with soft tissue injury involving left pelvic periorbital area/bilateral upper extremities: Supportive care-mobilize with PT/OT.  Per patient he is updated with his tetanus vaccine.  Note-he required suture to his left forearm small arterial bleeding.  Low-density changes seen on occipital lobe on CT imaging: MRI brain nonacute.  Mechanical mitral valve replacement: On Coumadin-being managed by pharmacy.  Goal INR 2.5-3.5  HTN: Blood pressure is improved low-dose beta-blocker added.  HLD: On statin  BMI: Estimated body mass index is 23.01 kg/m as calculated from the following:   Height as of this encounter: '5\' 11"'$  (1.803 m).   Weight as of this encounter: 74.8 kg.   Code status:   Code Status: DNR   DVT Prophylaxis:Place TED hose Start: 12/09/21 0849 Place TED hose Start: 12/08/21 1008    Family Communication: None at bedside   Disposition Plan: Status is: Observation The patient will require care spanning > 2 midnights and should be moved to inpatient because: Ongoing orthostatic hypotension-continuing gentle hydration for a few more hours-holding all antihypertensives-not yet stable for discharge.   Planned Discharge Destination:Home likely on 7/30   Diet: Diet Order             Diet regular Room service appropriate? Yes; Fluid consistency: Thin  Diet effective now                   MEDICATIONS: Scheduled Meds:  metoprolol tartrate  25 mg Oral BID   rosuvastatin  20 mg Oral Daily   Warfarin - Pharmacist Dosing Inpatient   Does not apply q1600   Continuous Infusions:   PRN Meds:.   I have personally reviewed following labs and imaging studies  LABORATORY DATA:  Recent Labs  Lab 12/07/21 1355  12/07/21 2248 12/08/21 0328 12/09/21 0658 12/10/21 0327  WBC 6.6 12.6* 11.7* 10.1 9.5  HGB 13.5 12.7* 11.5* 10.4* 9.5*  HCT 41.5 39.2 35.3* 30.8* 29.1*  PLT 177 182 165 172 162  MCV 97.6 98.5 97.0 96.9 96.7  MCH 31.8 31.9 31.6 32.7 31.6  MCHC 32.5 32.4 32.6 33.8 32.6  RDW 12.9 12.9 12.9 13.1 13.0  LYMPHSABS  --  1.5  --   --   --   MONOABS  --  1.4*  --   --   --   EOSABS  --  0.0  --   --   --   BASOSABS  --  0.0  --   --   --     Recent Labs  Lab 12/07/21 1355 12/08/21 0328 12/09/21 0658 12/10/21 0327  NA 141  --   --  138  K 3.9  --   --  3.9  CL 106  --   --  105  CO2 26  --   --  26  GLUCOSE 139*  --   --  98  BUN 22  --   --  22  CREATININE 0.98  --   --  0.82  CALCIUM 8.7*  --   --  8.2*  MG  --   --   --  1.9  INR 3.5* 3.2* 3.4* 3.2*  BNP  --   --   --  48.0    RADIOLOGY STUDIES/RESULTS: DG Chest Port 1 View  Result Date: 12/09/2021 CLINICAL DATA:  Shortness of breath. Level 2 trauma. Fell on thinners. EXAM: PORTABLE CHEST 1 VIEW COMPARISON:  None Available. FINDINGS: Postoperative changes in the mediastinum. Heart size and pulmonary vascularity are normal. Emphysematous changes in the lungs with fibrosis scattered throughout the lungs. Bilateral upper lobe scarring and apical pleural calcifications are likely postinflammatory. No airspace disease or consolidation. No pleural effusions. No pneumothorax. Mediastinal contours appear intact. IMPRESSION: Emphysematous changes, fibrosis, and apical postinflammatory changes demonstrated in the chest. No focal consolidation. Electronically Signed   By: Lucienne Capers M.D.   On: 12/09/2021 20:27   MR BRAIN WO CONTRAST  Result Date: 12/08/2021 CLINICAL DATA:  Initial evaluation for acute dizziness. EXAM: MRI HEAD WITHOUT CONTRAST TECHNIQUE: Multiplanar, multiecho pulse sequences of the brain and surrounding structures were obtained without intravenous contrast. COMPARISON:  Prior head CT from 12/07/2021 as well as brain  MRI from 06/19/2015. FINDINGS: Brain: Generalized age-related cerebral atrophy. Scattered patchy T2/FLAIR hyperintensity involving the periventricular deep white matter both cerebral hemispheres as well as the pons, most consistent with chronic small vessel ischemic disease. Changes are progressed as compared to 2017. Few scattered superimposed remote lacunar infarcts present about the pons/midbrain and cerebellum. Encephalomalacia and gliosis involving the right occipital lobe with associated susceptibility artifact, consistent with prior hemorrhagic infarction or bleed at this location. Additionally, there are multiple scattered chronic micro hemorrhages involving both cerebral and cerebellar hemispheres. While these could be hypertensive/small vessel related, possible cerebral amyloid angiopathy could also be considered. No acute or subacute intracranial infarct. Gray-white matter differentiation maintained. No acute intracranial hemorrhage. No mass lesion, midline shift or mass effect. No hydrocephalus or extra-axial fluid collection. Pituitary gland grossly within normal limits. Vascular: Major intracranial vascular flow voids are maintained. Skull and upper cervical spine: Craniocervical junction within normal limits. Bone marrow signal intensity within normal limits. Small left periorbital/facial contusion noted. Sinuses/Orbits: Globes and orbital soft tissues demonstrate no acute finding. Mild scattered mucosal thickening about the ethmoidal air cells. Paranasal sinuses are otherwise clear. Trace right mastoid effusion noted from about significance. Other: None. IMPRESSION: 1. No acute intracranial abnormality. 2. Small left periorbital/facial contusion. 3. Chronic encephalomalacia of with hemosiderin staining at the right occipital lobe, consistent with remote hemorrhagic infarct and/or bleed at this location. 4. Generalized age-related cerebral atrophy with chronic small vessel ischemic disease, with a few  scattered remote lacunar infarcts involving the pons/midbrain and cerebellum. Overall, changes are progressed as compared to 2017. 5. Multiple scattered chronic micro hemorrhages involving both cerebral and cerebellar hemispheres. Findings are nonspecific, and could be related to chronic underlying hypertension or possibly cerebral amyloid angiopathy. Electronically Signed   By: Jeannine Boga M.D.   On: 12/08/2021 20:01   ECHOCARDIOGRAM COMPLETE  Result Date: 12/08/2021    ECHOCARDIOGRAM REPORT   Patient Name:   ARI BERNABEI Date of Exam: 12/08/2021 Medical Rec #:  [id number]          Height:       71.0 in Accession #:    [id number]         Weight:       165.0 lb Date of Birth:  1936/01/27           BSA:          1.943 m Patient Age:    20 years           BP:  124/69 mmHg Patient Gender: M                  HR:           92 bpm. Exam Location:  Inpatient Procedure: 2D Echo, Color Doppler, Cardiac Doppler and Intracardiac            Opacification Agent Indications:    R55 Syncope  History:        Patient has prior history of Echocardiogram examinations, most                 recent 10/17/2006. Risk Factors:Dyslipidemia. Mechanical MVR June                 2003.  Sonographer:    Raquel Sarna Senior RDCS Referring Phys: [id number] [place]  1. Study is not well visualized. Left ventricular ejection fraction, by estimation, is 60 to 65%. The left ventricle has normal function. The left ventricle has no regional wall motion abnormalities. Left ventricular diastolic parameters are consistent with Grade I diastolic dysfunction (impaired relaxation).  2. Right ventricular systolic function is normal. The right ventricular size is not well visualized. Tricuspid regurgitation signal is inadequate for assessing PA pressure.  3. S/p mechanical MVR 2003. MV mean gradient 4 mmHg at HR 92 bpm. No significant stenosis; normal prosthesis. No evidence of mitral valve regurgitation.  4. The aortic valve was not  well visualized. Aortic valve regurgitation is not visualized. No aortic stenosis is present.  5. The inferior vena cava is normal in size with greater than 50% respiratory variability, suggesting right atrial pressure of 3 mmHg. Comparison(s): No prior Echocardiogram. Conclusion(s)/Recommendation(s): Normal biventricular function without evidence of hemodynamically significant valvular heart disease. FINDINGS  Left Ventricle: Study is not well visualized. Left ventricular ejection fraction, by estimation, is 60 to 65%. The left ventricle has normal function. The left ventricle has no regional wall motion abnormalities. Definity contrast agent was given IV to delineate the left ventricular endocardial borders. The left ventricular internal cavity size was normal in size. Left ventricular diastolic parameters are consistent with Grade I diastolic dysfunction (impaired relaxation). Right Ventricle: The right ventricular size is not well visualized. Right ventricular systolic function is normal. Tricuspid regurgitation signal is inadequate for assessing PA pressure. Left Atrium: Left atrial size was normal in size. Right Atrium: Right atrial size was normal in size. Pericardium: There is no evidence of pericardial effusion. Mitral Valve: S/p mechanical MVR 2003. MV mean gradient 4 mmHg at HR 92 bpm. No significant stenosis; normal prosthesis. No evidence of mitral valve regurgitation. MV peak gradient, 7.8 mmHg. The mean mitral valve gradient is 4.5 mmHg. Tricuspid Valve: Tricuspid valve regurgitation is not demonstrated. Aortic Valve: The aortic valve was not well visualized. Aortic valve regurgitation is not visualized. No aortic stenosis is present. Pulmonic Valve: The pulmonic valve was not well visualized. Pulmonic valve regurgitation is not visualized. Aorta: The aortic root and ascending aorta are structurally normal, with no evidence of dilitation. Venous: The inferior vena cava is normal in size with greater  than 50% respiratory variability, suggesting right atrial pressure of 3 mmHg. IAS/Shunts: No atrial level shunt detected by color flow Doppler.  LEFT VENTRICLE PLAX 2D LVOT diam:     2.00 cm   Diastology LV SV:         52        LV e' medial:    6.53 cm/s LV SV Index:   27  LV E/e' medial:  13.2 LVOT Area:     3.14 cm  LV e' lateral:   8.05 cm/s                          LV E/e' lateral: 10.7  RIGHT VENTRICLE RV S prime:     10.20 cm/s TAPSE (M-mode): 1.9 cm LEFT ATRIUM           Index LA Vol (A4C): 42.8 ml 22.03 ml/m  AORTIC VALVE LVOT Vmax:   95.30 cm/s LVOT Vmean:  67.200 cm/s LVOT VTI:    0.165 m  AORTA Ao Root diam: 3.50 cm Ao Asc diam:  3.50 cm MITRAL VALVE MV Area (PHT): 4.31 cm     SHUNTS MV Area VTI:   2.10 cm     Systemic VTI:  0.16 m MV Peak grad:  7.8 mmHg     Systemic Diam: 2.00 cm MV Mean grad:  4.5 mmHg MV Vmax:       1.40 m/s MV Vmean:      100.9 cm/s MV Decel Time: 176 msec MV E velocity: 85.90 cm/s MV A velocity: 111.00 cm/s MV E/A ratio:  0.77 Mary Scientist, physiological signed by Phineas Inches Signature Date/Time: 12/08/2021/2:27:52 PM    Final      LOS: 1 day   Signature  Lala Lund M.D on 12/10/2021 at 10:40 AM   -  To page go to [email or web address]

## 2021-12-10 NOTE — Progress Notes (Signed)
[place] for Warfarin  Indication: MVR  Allergies  Allergen Reactions   Cipro [Ciprofloxacin Hcl] Other (See Comments)    Heartburn    Sulfa Drugs Cross Reactors Hives    Patient Measurements: Height: '5\' 11"'$  (180.3 cm) Weight: 74.8 kg (165 lb) IBW/kg (Calculated) : 75.3  Vital Signs: Temp: 97.6 F (36.4 C) (07/31 1147) Temp Source: Oral (07/31 1147) BP: 128/73 (07/31 1147) Pulse Rate: 103 (07/31 1147)  Labs: Recent Labs    12/08/21 0328 12/09/21 0658 12/10/21 0327  HGB 11.5* 10.4* 9.5*  HCT 35.3* 30.8* 29.1*  PLT 165 172 162  LABPROT 32.4* 34.2* 32.8*  INR 3.2* 3.4* 3.2*  CREATININE  --   --  0.82     Estimated Creatinine Clearance: 70.9 mL/min (by C-G formula based on SCr of 0.82 mg/dL).   Medical History: Past Medical History:  Diagnosis Date   Benign prostatic hypertrophy    Dyslipidemia    Hyperlipidemia    Long-term (current) use of anticoagulants    Mitral valve prolapse    Osteoporosis    Ventricular tachycardia [facility])     [name], INR is 3.5, pt in the ED s/p fall with significant skin tears that took multiple hours to stop bleeding. Periorbital hematoma and left lateral thigh hematoma noted. No bleeding on CT head.   7/31:  INR 3.2, therapeutic, Goal 2.5-3.5.  Hgb 10.4>9.5,  PLTC  wnl. No bleeding noted. [name], [name]   Goal of Therapy:  INR 2.5-3.5 Monitor platelets by anticoagulation protocol: Yes   Plan:  Warfarin 5 mg PO x1 today per PTA regimen  Daily CBC/INR  Monitor for s/sx of bleeding    Nicole Cella, RPh Clinical Pharmacist [phone] 12/10/2021 2:08 PM   Please check AMION for all [place] phone numbers After 10:00 PM, call [place] [phone]

## 2021-12-10 NOTE — TOC CAGE-AID Note (Signed)
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 Patient denies any alcohol or drug use, no need for substance abuse resources at this time.  CAGE-AID Screening:    Have You Ever Felt You Ought to Cut Down on Your Drinking or Drug Use?: No Have People Annoyed You By Critizing Your Drinking Or Drug Use?: No Have You Felt Bad Or Guilty About Your Drinking Or Drug Use?: No Have You Ever Had a Drink or Used Drugs First Thing In The Morning to Steady Your Nerves or to Get Rid of a Hangover?: No CAGE-AID Score: 0  Substance Abuse Education Offered: No

## 2021-12-10 NOTE — Plan of Care (Signed)
  Problem: Clinical Measurements: Goal: Will remain free from infection Outcome: Progressing   Problem: Clinical Measurements: Goal: Diagnostic test results will improve Outcome: Progressing   Problem: Clinical Measurements: Goal: Respiratory complications will improve Outcome: Progressing   Problem: Clinical Measurements: Goal: Cardiovascular complication will be avoided Outcome: Progressing   Problem: Activity: Goal: Risk for activity intolerance will decrease Outcome: Progressing   

## 2021-12-10 NOTE — Consult Note (Signed)
WOC Nurse Consult Note: Reason for Consult: multiple wounds from mechanical fall  Wound type: Skin tear left arm requiring sutures Abrasions hands and face Pressure Injury POA: NA Measurement:see nursing flow sheets Dressing procedure/placement/frequency: I would not recommend daily wound care as to not disrupt hemostasis  Dressings last applied and reinforced 7/31 per notes.   Beginning 12/11/21. Removed dressing to the UEs, knees, hands Use calcium alginate on and wounds with oozing. Cover with ABD pads and secure with kerlix and Coban. Wounds with no active oozing use single layer of xeroform gauze.   Change every other day.   Kellie Simmering # for alginate [id number] placed this in wound care order as well.   Will add PRN order for silver nitrate use for oozing if needed during dressing changes.    Re consult if needed, will not follow at this time. Thanks  Dannielle Baskins [employer], RN,CWOCN, CNS, [place] [phone])

## 2021-12-10 NOTE — Progress Notes (Signed)
This one is back approved! Approved 8/1 - 8/3, next review due 8/3. Reference ID: [id number]. Plan auth still generating.

## 2021-12-10 NOTE — TOC Initial Note (Signed)
Transition of Care [facility]) - Initial/Assessment Note    Patient Details  Name: Terry [name] MRN: [id number] Date of Birth: [birth year]  [facility] CM/SW Contact:    Geralynn Ochs, LCSW Phone Number: 12/10/2021, 12:28 PM  Clinical Narrative:      CSW met with patient to discuss recommendation for SNF, and patient in agreement. CSW provided CMS choice list and patient chose [facility] in [place]. CSW sent referral, and [facility] is able to offer a bed. CSW sent request for insurance authorization, awaiting approval. CSW to follow.             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 CMS Medicare.gov Compare Post Acute Care list provided to:: Patient Choice offered to / list presented to : Patient  Expected Discharge Plan and Services Expected Discharge Plan: [place] Choice: [place] Living arrangements for the past 2 months: Single Family Home                                      Prior Living Arrangements/Services Living arrangements for the past 2 months: Single Family Home Lives with:: Self Patient language and need for interpreter reviewed:: No Do you feel safe going back to the place where you live?: Yes      Need for Family Participation in Patient Care: No (Comment) Care giver support system in place?: No (comment)   Criminal Activity/Legal Involvement Pertinent to Current Situation/Hospitalization: No - Comment as needed  Activities of Daily Living Home Assistive Devices/Equipment: None ADL Screening (condition at time of admission) Patient's cognitive ability adequate to safely complete daily activities?: Yes Is the patient deaf or have difficulty  hearing?: Yes Does the patient have difficulty seeing, even when wearing glasses/contacts?: No Does the patient have difficulty concentrating, remembering, or making decisions?: No Patient able to express need for assistance with ADLs?: Yes Does the patient have difficulty dressing or bathing?: No Independently performs ADLs?: Yes (appropriate for developmental age) Does the patient have difficulty walking or climbing stairs?: Yes Weakness of Legs: None Weakness of Arms/Hands: None  Permission Sought/Granted Permission sought to share information with : Chartered certified accountant granted to share information with : Yes, Verbal Permission Granted     Permission granted to share info w AGENCY: SNF        Emotional Assessment Appearance:: Appears stated age Attitude/Demeanor/Rapport: Engaged Affect (typically observed): Appropriate Orientation: : Oriented to Self, Oriented to Place, Oriented to  Time, Oriented to Situation Alcohol / Substance Use: Not Applicable Psych Involvement: No (comment)  Admission diagnosis:  Syncope [R55] Conjunctival hemorrhage of left eye [H11.32] Fall, initial encounter [W19.XXXA] Periorbital hematoma of left eye [H05.232] Skin tear of left elbow without complication, initial encounter [S51.012A] Wound of left upper extremity, initial encounter [S41.102A] Syncope, unspecified syncope type [R55] Patient Active Problem List   Diagnosis Date Noted   Fall 12/08/2021   Multiple abrasions 12/08/2021   Hematoma 12/08/2021   Syncope 12/07/2021   Encounter for therapeutic drug monitoring 05/09/2017   Chronic anticoagulation 03/01/2016   S/P mitral valve replacement 11/09/2010   H/O mitral valve replacement with mechanical valve 08/03/2010   PCP:  Crist Infante, MD Pharmacy:   [id number] [place], [id number]: [phone]:  [phone]    Social Determinants of Health (SDOH) Interventions    Readmission Risk Interventions     No data to display

## 2021-12-10 NOTE — Progress Notes (Signed)
Occupational Therapy Treatment Patient Details Name: Terry [name] MRN: [id number] DOB: Oct 01, 1935 Today's Date: 12/10/2021   History of present illness 86 y/o male presented to ED on 12/07/21 after fall in parking lot. Had a syncopal event in ED. 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 PMH: mitral valve replacement, HTN   OT comments  Pt progressing towards established OT goals. Pt presents with increased dexterity and functional use of BUE, however, continues with pain and decreased strength. Pt performing grooming while standing at sink with min guard. Educated on use of compensatory dressing techniques, and pt pulling up socks with min A due to decreased grip strength. Performing functional mobility with min guard using RW; noting increased work of breathing, but VSS. Pt reporting difficulty relaxing when standing. Educated on four square breathing technique, and pt with ability to return demonstrate. Recommend discharge to SNF for continued OT services to optimize independence in ADL and IADL. Will continue to follow acutely.    Recommendations for follow up therapy are one component of a multi-disciplinary discharge planning process, led by the attending physician.  Recommendations may be updated based on patient status, additional functional criteria and insurance authorization.    Follow Up Recommendations  Skilled nursing-short term rehab (<3 hours/day)    Assistance Recommended at Discharge Intermittent Supervision/Assistance  Patient can return home with the following  A little help with walking and/or transfers;Assistance with cooking/housework;Direct supervision/assist for medications management;Direct supervision/assist for financial management;Assist for transportation;Help with stairs or ramp for entrance;A lot of help with bathing/dressing/bathroom   Equipment Recommendations  None recommended by OT;Other (comment) (defer to next venue of care)     Recommendations for Other Services      Precautions / Restrictions Precautions Precautions: Fall;Other (comment) Precaution Comments: watch BP (+orthostatics but assymptomatic) Restrictions Weight Bearing Restrictions: No       Mobility Bed Mobility               General bed mobility comments: Up in recliner on arrival and departure    Transfers Overall transfer level: Needs assistance Equipment used: Rolling walker (2 wheels) Transfers: Sit to/from Stand Sit to Stand: Min guard     Step pivot transfers: Min guard     General transfer comment: increased time     Balance Overall balance assessment: Needs assistance Sitting-balance support: No upper extremity supported, Feet supported Sitting balance-Leahy Scale: Good Sitting balance - Comments: Reaching to feet sitting edge of recliner.   Standing balance support: Bilateral upper extremity supported, During functional activity, No upper extremity supported Standing balance-Leahy Scale: Fair Standing balance comment: RW for amb due to L hip pain                           ADL either performed or assessed with clinical judgement   ADL Overall ADL's : Needs assistance/impaired Eating/Feeding: Modified independent Eating/Feeding Details (indicate cue type and reason): Pt finishing up breakfast on arrival Grooming: Min guard;Standing;Oral care Grooming Details (indicate cue type and reason): oral care in standing; min guard A for safety. No physical assist needed.             Lower Body Dressing: Minimal assistance Lower Body Dressing Details (indicate cue type and reason): to don sock due to decreased fine motor dexterity. educated on use of figure 4 position Toilet Transfer: Min guard;Comfort height toilet;Ambulation;Rolling walker (2 wheels) Toilet Transfer Details (indicate cue type and reason): simulated in room  Functional mobility during ADLs: Min guard;Rolling walker (2  wheels) General ADL Comments: Performing functional mobility in hallway with min guard A. 1-2 cues for safety. With one minor LOB with self recovery. Pt reporting difficulty relaxing and not being tense in standing, additionally with labored breathing, however, VSS> Guided through four square breathing, and pt with good compliance, reporting he feels more relaxd. Encouraged to use deep breathing as needed in standing.    Extremity/Trunk Assessment Upper Extremity Assessment Upper Extremity Assessment: Generalized weakness (Impaired FM skills, bil UE bandaged, L elbow bandaged)   Lower Extremity Assessment Lower Extremity Assessment: Defer to PT evaluation        Vision   Additional Comments: Pt continues with bruising and blood in L eye. Pt with skill to scan at sink to locate items with increased time.   Perception Perception Perception: Not tested   Praxis Praxis Praxis: Not tested    Cognition Arousal/Alertness: Awake/alert Behavior During Therapy: WFL for tasks assessed/performed Overall Cognitive Status: Within Functional Limits for tasks assessed                                 General Comments: Pt with slightly decreased processing speed, reporting that he feels a bit off from his baseline. However, cognition WFL for tasks assessed.        Exercises      Shoulder Instructions       General Comments BP 111/75 (85) seated; 110/67 (80) standing; 129/69 (89)    Pertinent Vitals/ Pain       Pain Assessment Pain Assessment: Faces Faces Pain Scale: Hurts even more Pain Location: L hip Pain Descriptors / Indicators: Discomfort, Grimacing, Guarding Pain Intervention(s): Limited activity within patient's tolerance, Monitored during session  Home Living                                          Prior Functioning/Environment              Frequency  Min 2X/week        Progress Toward Goals  OT Goals(current goals can now be  found in the care plan section)  Progress towards OT goals: Progressing toward goals  Acute Rehab OT Goals Patient Stated Goal: To go to rehab and eventually get back to volunteering OT Goal Formulation: With patient Time For Goal Achievement: 12/22/21 Potential to Achieve Goals: Good ADL Goals Pt Will Perform Upper Body Dressing: with modified independence;sitting Pt Will Perform Lower Body Dressing: with modified independence;sitting/lateral leans;sit to/from stand Pt Will Transfer to Toilet: with modified independence;regular height toilet;ambulating Pt Will Perform Tub/Shower Transfer: Shower transfer;shower seat;ambulating;with modified independence Additional ADL Goal #1: pt will complete bed mobility with mod I in prep for ADLs  Plan Discharge plan remains appropriate    Co-evaluation                 AM-PAC OT "6 Clicks" Daily Activity     Outcome Measure   Help from another person eating meals?: A Little Help from another person taking care of personal grooming?: A Little Help from another person toileting, which includes using toliet, bedpan, or urinal?: A Lot Help from another person bathing (including washing, rinsing, drying)?: A Little Help from another person to put on and taking off regular upper body clothing?: A Little Help from another person  to put on and taking off regular lower body clothing?: A Little 6 Click Score: 17    End of Session Equipment Utilized During Treatment: Gait belt;Rolling walker (2 wheels)  OT Visit Diagnosis: Other abnormalities of gait and mobility (R26.89);Unsteadiness on feet (R26.81);Muscle weakness (generalized) (M62.81)   Activity Tolerance Patient tolerated treatment well   Patient Left in chair;with call bell/phone within reach;with chair alarm set   Nurse Communication Mobility status        Time: 0459-1368 OT Time Calculation (min): 30 min  Charges: OT General Charges $OT Visit: 1 Visit OT Treatments $Self  Care/Home Management : 8-22 mins $Therapeutic Activity: 8-22 mins  Shanda Howells, OTR/L [facility] Acute Rehabilitation Office: [phone]   [name] 12/10/2021, 10:34 AM

## 2021-12-10 NOTE — Progress Notes (Signed)
Physical Therapy Treatment Patient Details Name: Terry [name] MRN: [id number] DOB: December 12, 1935 Today's Date: 12/10/2021   History of Present Illness 86 y/o male presented to ED on 12/07/21 after fall in parking lot. Had a syncopal event in ED. 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 PMH: mitral valve replacement, HTN    PT Comments    Patient progressing slowly with painful L hip and swelling L UE.  Still eager for ambulation despite pain and feels he works out some of the soreness with activity.  L hand unable to place on sink due to pain and swelling so LE therex in standing performed with hands on walker.  He remains appropriate for STSNF.  Family supportive and helping with decision.   Recommendations for follow up therapy are one component of a multi-disciplinary discharge planning process, led by the attending physician.  Recommendations may be updated based on patient status, additional functional criteria and insurance authorization.  Follow Up Recommendations  Skilled nursing-short term rehab (<3 hours/day)     Assistance Recommended at Discharge Frequent or constant Supervision/Assistance  Patient can return home with the following A little help with walking and/or transfers;A lot of help with bathing/dressing/bathroom;Assistance with cooking/housework;Assist for transportation;Assistance with feeding   Equipment Recommendations  Rolling walker (2 wheels)    Recommendations for Other Services       Precautions / Restrictions Precautions Precautions: Fall Precaution Comments: watch BP (+orthostatics but assymptomatic) Restrictions Weight Bearing Restrictions: No     Mobility  Bed Mobility               General bed mobility comments: Up in recliner on arrival and departure    Transfers Overall transfer level: Needs assistance Equipment used: Rolling walker (2 wheels) Transfers: Sit to/from Stand Sit to Stand: Min assist            General transfer comment: assist for balance and anterior weight shift    Ambulation/Gait Ambulation/Gait assistance: Min guard, Min assist Gait Distance (Feet): 200 Feet Assistive device: Rolling walker (2 wheels) Gait Pattern/deviations: Step-to pattern, Step-through pattern, Decreased stride length, Antalgic, Trunk flexed       General Gait Details: antalgic on L hip especially with turning needing min A for turns due to hip instability   Stairs             Wheelchair Mobility    Modified Rankin (Stroke Patients Only)       Balance Overall balance assessment: Needs assistance Sitting-balance support: Feet supported Sitting balance-Leahy Scale: Good     Standing balance support: Reliant on assistive device for balance, Single extremity supported Standing balance-Leahy Scale: Poor Standing balance comment: needing at least 1 UE support or min A when transitioning hands to sink in the room                            Cognition Arousal/Alertness: Awake/alert Behavior During Therapy: WFL for tasks assessed/performed Overall Cognitive Status: Within Functional Limits for tasks assessed                                          Exercises General Exercises - Lower Extremity Hip ABduction/ADduction: AROM, Both, 10 reps, Standing Hip Flexion/Marching: AROM, Both, 10 reps, Standing Other Exercises Other Exercises: standing hip extension x 10 on both legs holding walker min A    General  Comments General comments (skin integrity, edema, etc.): three neices in the room and SW in during session      Pertinent Vitals/Pain Pain Assessment Faces Pain Scale: Hurts even more Pain Location: L hip Pain Descriptors / Indicators: Discomfort, Grimacing, Guarding Pain Intervention(s): Monitored during session, Repositioned, Ice applied    Home Living                          Prior Function            PT Goals (current goals can  now be found in the care plan section) Progress towards PT goals: Progressing toward goals    Frequency    Min 3X/week      PT Plan Current plan remains appropriate    Co-evaluation              AM-PAC PT "6 Clicks" Mobility   Outcome Measure  Help needed turning from your back to your side while in a flat bed without using bedrails?: A Little Help needed moving from lying on your back to sitting on the side of a flat bed without using bedrails?: A Little Help needed moving to and from a bed to a chair (including a wheelchair)?: A Little Help needed standing up from a chair using your arms (e.g., wheelchair or bedside chair)?: A Little Help needed to walk in hospital room?: A Little Help needed climbing 3-5 steps with a railing? : Total 6 Click Score: 16    End of Session Equipment Utilized During Treatment: Gait belt Activity Tolerance: Patient tolerated treatment well Patient left: in chair;with call bell/phone within reach;with family/visitor present   PT Visit Diagnosis: Muscle weakness (generalized) (M62.81);History of falling (Z91.81);Difficulty in walking, not elsewhere classified (R26.2)     Time: 5456-2563 PT Time Calculation (min) (ACUTE ONLY): 30 min  Charges:  $Gait Training: 8-22 mins $Therapeutic Exercise: 8-22 mins                     Magda Kiel, PT Acute Rehabilitation Services [phone] 12/10/2021    [name] 12/10/2021, 12:53 PM

## 2021-12-11 DIAGNOSIS — R55 Syncope and collapse: Secondary | ICD-10-CM | POA: Diagnosis not present

## 2021-12-11 LAB — CBC
HCT: 26.6 % — ABNORMAL LOW (ref 39.0–52.0)
Hemoglobin: 8.5 g/dL — ABNORMAL LOW (ref 13.0–17.0)
MCH: 31.1 pg (ref 26.0–34.0)
MCHC: 32 g/dL (ref 30.0–36.0)
MCV: 97.4 fL (ref 80.0–100.0)
Platelets: 193 10*3/uL (ref 150–400)
RBC: 2.73 MIL/uL — ABNORMAL LOW (ref 4.22–5.81)
RDW: 13.1 % (ref 11.5–15.5)
WBC: 9.4 10*3/uL (ref 4.0–10.5)
nRBC: 0 % (ref 0.0–0.2)

## 2021-12-11 LAB — BASIC METABOLIC PANEL
Anion gap: 6 (ref 5–15)
BUN: 23 mg/dL (ref 8–23)
CO2: 28 mmol/L (ref 22–32)
Calcium: 8.3 mg/dL — ABNORMAL LOW (ref 8.9–10.3)
Chloride: 104 mmol/L (ref 98–111)
Creatinine, Ser: 0.84 mg/dL (ref 0.61–1.24)
GFR, Estimated: >60 mL/min (ref >60)
Glucose, Bld: 110 mg/dL — ABNORMAL HIGH (ref 70–99)
Potassium: 4 mmol/L (ref 3.5–5.1)
Sodium: 138 mmol/L (ref 135–145)

## 2021-12-11 LAB — BRAIN NATRIURETIC PEPTIDE: B Natriuretic Peptide: 64.8 pg/mL (ref 0.0–100.0)

## 2021-12-11 LAB — ABO/RH: ABO/RH(D): A POS

## 2021-12-11 LAB — MAGNESIUM: Magnesium: 2.1 mg/dL (ref 1.7–2.4)

## 2021-12-11 LAB — PROTIME-INR
INR: 3 — ABNORMAL HIGH (ref 0.8–1.2)
Prothrombin Time: 31.1 seconds — ABNORMAL HIGH (ref 11.4–15.2)

## 2021-12-11 NOTE — Progress Notes (Signed)
Dressings changed to posterior bilateral hands and Left posterior forearm.   Cleansed with sterile saline and applied xeroform, kerlix and coban to R hand.  L hand cleansed with sterile saline, Alginate, ABD, kerlix and coban applied. Small amount of sanguinous drainage.  L forearm cleansed, significant sanguinous drainage. Single stick of Silver Nitrate applied, Alginate, ABD, kerlix and coban used.

## 2021-12-11 NOTE — Plan of Care (Signed)

## 2021-12-11 NOTE — Progress Notes (Signed)
PROGRESS NOTE        PATIENT DETAILS Name: Terry [name] Age: 86 y.o. Sex: male Date of Birth: 1935-12-14 Admit Date: 12/07/2021 Admitting Physician Orene Desanctis, DO [id number], [name], MD  Brief Summary: Patient is a 86 y.o.  male with history of mitral valve replacement with a mechanical valve in 2003, HLD, HTN, BPH-who works as a Psychologist, occupational here at [facility] to the hospital following a mechanical fall while walking in the parking lot (tripped on a speed bump)-unfortunately while in the ED-he sustained a syncopal episode and subsequently admitted to the [facility] hospitalist service.  See below for further details.   Significant events: 7/28>> fall at [facility] tears on bilateral arms/face-s/p suturing/combat gauze/pressure-subsequently when attempting to ambulate-he syncopized.  Admit to [facility]. 7/29>> orthostatic vital signs positive  Significant studies: 7/28>> x-ray left forearm: No fracture/dislocation.  Soft tissue swelling over the dorsum. 7/28>> x-ray right hand: No fracture. 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 7/28>> CT maxillofacial: Left periorbital soft tissue swelling without acute fracture.  7/29 >>  MRI - 1. No acute intracranial abnormality. 2. Small left periorbital/facial contusion. 3. Chronic encephalomalacia of with hemosiderin staining at the right occipital lobe, consistent with remote hemorrhagic infarct and/or bleed at this location. 4. Generalized age-related cerebral atrophy with chronic small vessel ischemic disease, with a few scattered remote lacunar infarcts involving the pons/midbrain and cerebellum. Overall, changes are progressed as compared to 2017. 5. Multiple scattered chronic micro hemorrhages involving both cerebral and cerebellar hemispheres. Findings are nonspecific, and could be related to chronic underlying hypertension or possibly cerebral amyloid  angiopathy  7/29 >> TTE -  1. Study is not well visualized. Left ventricular ejection fraction, by estimation, is 60 to 65%. The left ventricle has normal function. The left ventricle has no regional wall motion abnormalities. Left ventricular diastolic parameters are consistent with Grade I diastolic dysfunction (impaired relaxation).  2. Right ventricular systolic function is normal. The right ventricular size is not well visualized. Tricuspid regurgitation signal is inadequate for assessing PA pressure.  3. S/p mechanical MVR 2003. MV mean gradient 4 mmHg at HR 92 bpm. No significant stenosis; normal prosthesis. No evidence of mitral valve regurgitation.  4. The aortic valve was not well visualized. Aortic valve regurgitation is not visualized. No aortic stenosis is present.  5. The inferior vena cava is normal in size with greater than 50% respiratory variability, suggesting right atrial pressure of 3 mmHg  Significant microbiology data: None  Procedures: None  Consults:  None  Subjective: Patient in bed, appears comfortable, denies any headache, no fever, no chest pain or pressure, no shortness of breath , no abdominal pain. No new focal weakness.   Objective: Vitals: Blood pressure 111/62, pulse (!) 109, temperature 99 F (37.2 C), temperature source Oral, resp. rate 18, height '5\' 11"'$  (1.803 m), weight 74.8 kg, SpO2 99 %.   Exam:  Alert awake-not in any distress.  Left periorbital ecchymosis present.  Numerous bandages present-did not open. [place].AT,PERRAL Supple Neck, No JVD,   Symmetrical Chest wall movement, Good air movement bilaterally, CTAB RRR,No Gallops, Rubs or new Murmurs,  +ve B.Sounds, Abd Soft, No tenderness,   No Cyanosis, Clubbing or edema     Assessment/Plan:  Syncope: Likely orthostatic hypotension-sustained a fall at home and incurred severe left periorbital bruise and laceration,  multiple wounds in upper extremities requiring bandages, lives by himself, also  on Coumadin with high INR.  Overall weak and frail do not think he is safe for discharge by himself to home at this point, continue PT OT, advance activity, echo, MRI nonacute.  Orthostatics have improved, stable on telemetry, looking for SNF bed.  Mechanical fall with soft tissue injury involving left pelvic periorbital area/bilateral upper extremities with bleeding and acute blood loss anemia: Supportive care-mobilize with PT/OT.  Per patient he is updated with his tetanus vaccine.  Note-he required suture to his left forearm small arterial bleeding.  He is still bleeding intermittently during dressing changes and H&H is now gradually falling, will stop his Coumadin on 12/11/2021 and once INR drops below 2.5 we will start heparin drip without bolus with caution.  He may require transfusions.  We will continue to monitor for now.  Mechanical mitral valve replacement: On Coumadin, Goal INR 2.5-3.5, plz see anticoagulation discussion above.  Low-density changes seen on occipital lobe on CT imaging: MRI brain nonacute.  HTN: Blood pressure is improved low-dose beta-blocker added.  HLD: On statin  BMI: Estimated body mass index is 23.01 kg/m as calculated from the following:   Height as of this encounter: '5\' 11"'$  (1.803 m).   Weight as of this encounter: 74.8 kg.   Code status:   Code Status: DNR   DVT Prophylaxis:Place TED hose Start: 12/09/21 0849 Place TED hose Start: 12/08/21 1008    Family Communication: None at bedside   Disposition Plan: Status is: Observation The patient will require care spanning > 2 midnights and should be moved to inpatient because: Ongoing orthostatic hypotension-continuing gentle hydration for a few more hours-holding all antihypertensives-not yet stable for discharge.   Planned Discharge Destination:Home likely on 7/30   Diet: Diet Order             Diet regular Room service appropriate? Yes; Fluid consistency: Thin  Diet effective now                    MEDICATIONS: Scheduled Meds:  metoprolol tartrate  25 mg Oral BID   rosuvastatin  20 mg Oral Daily   Continuous Infusions:   PRN Meds:.   I have personally reviewed following labs and imaging studies  LABORATORY DATA:  Recent Labs  Lab 12/07/21 2248 12/08/21 0328 12/09/21 0658 12/10/21 0327 12/11/21 0229  WBC 12.6* 11.7* 10.1 9.5 9.4  HGB 12.7* 11.5* 10.4* 9.5* 8.5*  HCT 39.2 35.3* 30.8* 29.1* 26.6*  PLT 182 165 172 162 193  MCV 98.5 97.0 96.9 96.7 97.4  MCH 31.9 31.6 32.7 31.6 31.1  MCHC 32.4 32.6 33.8 32.6 32.0  RDW 12.9 12.9 13.1 13.0 13.1  LYMPHSABS 1.5  --   --   --   --   MONOABS 1.4*  --   --   --   --   EOSABS 0.0  --   --   --   --   BASOSABS 0.0  --   --   --   --     Recent Labs  Lab 12/07/21 1355 12/08/21 0328 12/09/21 0658 12/10/21 0327 12/11/21 0229  NA 141  --   --  138 138  K 3.9  --   --  3.9 4.0  CL 106  --   --  105 104  CO2 26  --   --  26 28  GLUCOSE 139*  --   --  98 110*  BUN  22  --   --  22 23  CREATININE 0.98  --   --  0.82 0.84  CALCIUM 8.7*  --   --  8.2* 8.3*  MG  --   --   --  1.9 2.1  INR 3.5* 3.2* 3.4* 3.2* 3.0*  BNP  --   --   --  48.0 64.8    RADIOLOGY STUDIES/RESULTS: DG Chest Port 1 View  Result Date: 12/09/2021 CLINICAL DATA:  Shortness of breath. Level 2 trauma. Fell on thinners. EXAM: PORTABLE CHEST 1 VIEW COMPARISON:  None Available. FINDINGS: Postoperative changes in the mediastinum. Heart size and pulmonary vascularity are normal. Emphysematous changes in the lungs with fibrosis scattered throughout the lungs. Bilateral upper lobe scarring and apical pleural calcifications are likely postinflammatory. No airspace disease or consolidation. No pleural effusions. No pneumothorax. Mediastinal contours appear intact. IMPRESSION: Emphysematous changes, fibrosis, and apical postinflammatory changes demonstrated in the chest. No focal consolidation. Electronically Signed   By: Lucienne Capers M.D.   On: 12/09/2021  20:27     LOS: 2 days   Signature  Lala Lund M.D on 12/11/2021 at 9:21 AM   -  To page go to [email or web address]

## 2021-12-12 DIAGNOSIS — R55 Syncope and collapse: Secondary | ICD-10-CM | POA: Diagnosis not present

## 2021-12-12 LAB — BASIC METABOLIC PANEL
Anion gap: 6 (ref 5–15)
BUN: 23 mg/dL (ref 8–23)
CO2: 26 mmol/L (ref 22–32)
Calcium: 8.1 mg/dL — ABNORMAL LOW (ref 8.9–10.3)
Chloride: 105 mmol/L (ref 98–111)
Creatinine, Ser: 0.83 mg/dL (ref 0.61–1.24)
GFR, Estimated: >60 mL/min (ref >60)
Glucose, Bld: 94 mg/dL (ref 70–99)
Potassium: 3.7 mmol/L (ref 3.5–5.1)
Sodium: 137 mmol/L (ref 135–145)

## 2021-12-12 LAB — CBC
HCT: 23.5 % — ABNORMAL LOW (ref 39.0–52.0)
Hemoglobin: 7.8 g/dL — ABNORMAL LOW (ref 13.0–17.0)
MCH: 32.2 pg (ref 26.0–34.0)
MCHC: 33.2 g/dL (ref 30.0–36.0)
MCV: 97.1 fL (ref 80.0–100.0)
Platelets: 215 10*3/uL (ref 150–400)
RBC: 2.42 MIL/uL — ABNORMAL LOW (ref 4.22–5.81)
RDW: 13.2 % (ref 11.5–15.5)
WBC: 8.5 10*3/uL (ref 4.0–10.5)
nRBC: 0 % (ref 0.0–0.2)

## 2021-12-12 LAB — HEMOGLOBIN AND HEMATOCRIT, BLOOD
HCT: 26.7 % — ABNORMAL LOW (ref 39.0–52.0)
Hemoglobin: 8.9 g/dL — ABNORMAL LOW (ref 13.0–17.0)

## 2021-12-12 LAB — MAGNESIUM: Magnesium: 1.9 mg/dL (ref 1.7–2.4)

## 2021-12-12 LAB — PREPARE RBC (CROSSMATCH)

## 2021-12-12 LAB — PROTIME-INR
INR: 2.8 — ABNORMAL HIGH (ref 0.8–1.2)
Prothrombin Time: 28.9 seconds — ABNORMAL HIGH (ref 11.4–15.2)

## 2021-12-12 LAB — BRAIN NATRIURETIC PEPTIDE: B Natriuretic Peptide: 115.5 pg/mL — ABNORMAL HIGH (ref 0.0–100.0)

## 2021-12-12 MED ORDER — SODIUM CHLORIDE 0.9% IV SOLUTION: Freq: Once | INTRAVENOUS | Status: AC

## 2021-12-12 NOTE — Progress Notes (Signed)
Physical Therapy Treatment Patient Details Name: Terry [name] MRN: [id number] DOB: 04/11/1936 Today's Date: 12/12/2021   History of Present Illness 86 y/o male presented to ED on 12/07/21 after fall in parking lot. Had a syncopal event in ED. 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 PMH: mitral valve replacement, HTN    PT Comments    Pt tolerates therapy well today, ambulating household distances with an AD and participating in therex. Pt continues to report LLE pain with mobilization, but it gradually resolves with increased movement and weightbearing. Continued therapy will assist the pt in building strength, balance, and activity tolerance for progression of independence and functional mobility.    Recommendations for follow up therapy are one component of a multi-disciplinary discharge planning process, led by the attending physician.  Recommendations may be updated based on patient status, additional functional criteria and insurance authorization.  Follow Up Recommendations  Skilled nursing-short term rehab (<3 hours/day) Can patient physically be transported by private vehicle: Yes   Assistance Recommended at Discharge Intermittent Supervision/Assistance  Patient can return home with the following A little help with walking and/or transfers;A little help with bathing/dressing/bathroom;Assistance with cooking/housework;Assist for transportation;Help with stairs or ramp for entrance   Equipment Recommendations  Rolling walker (2 wheels)    Recommendations for Other Services       Precautions / Restrictions Precautions Precautions: Fall Precaution Comments: watch BP Restrictions Weight Bearing Restrictions: No     Mobility  Bed Mobility Overal bed mobility: Needs Assistance Bed Mobility: Supine to Sit, Sit to Supine     Supine to sit: Supervision, HOB elevated Sit to supine: Supervision, HOB elevated   General bed mobility comments: Pt received with  bed alarm off, however states multiple times that he may get up regardless or that he will forget he shouldn't get up without the help of staff    Transfers Overall transfer level: Needs assistance Equipment used: Rolling walker (2 wheels) Transfers: Sit to/from Stand Sit to Stand: Min guard, From elevated surface           General transfer comment: Verbal cues for hand placement    Ambulation/Gait Ambulation/Gait assistance: Min guard Gait Distance (Feet): 60 Feet Assistive device: Rolling walker (2 wheels) Gait Pattern/deviations: Step-through pattern, Decreased stride length, Antalgic, Trunk flexed Gait velocity: decreased Gait velocity interpretation: <1.31 ft/sec, indicative of household ambulator   General Gait Details: Antalgic on L Hip but gets better with increased mobility   Stairs             Wheelchair Mobility    Modified Rankin (Stroke Patients Only)       Balance Overall balance assessment: Needs assistance Sitting-balance support: Feet supported, No upper extremity supported Sitting balance-Leahy Scale: Good     Standing balance support: Bilateral upper extremity supported, During functional activity, Reliant on assistive device for balance Standing balance-Leahy Scale: Poor                              Cognition Arousal/Alertness: Awake/alert Behavior During Therapy: WFL for tasks assessed/performed Overall Cognitive Status: Within Functional Limits for tasks assessed                                          Exercises Other Exercises Other Exercises: LAQ LLE x5 (Complains of increased hip pain towards terminal extension of knee)  Other Exercises: Standing Marches - 45s Other Exercises: Mini Squats x10    General Comments General comments (skin integrity, edema, etc.): VSS on RA      Pertinent Vitals/Pain Pain Assessment Pain Assessment: Faces Faces Pain Scale: Hurts a little bit Pain Location: L  hip Pain Descriptors / Indicators: Discomfort, Grimacing, Guarding Pain Intervention(s): Monitored during session    Home Living                          Prior Function            PT Goals (current goals can now be found in the care plan section) Acute Rehab PT Goals Patient Stated 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 PT Goal Formulation: With patient Time For Goal Achievement: 12/22/21 Potential to Achieve Goals: Good Progress towards PT goals: Progressing toward goals    Frequency    Min 3X/week      PT Plan Current plan remains appropriate    Co-evaluation              AM-PAC PT "6 Clicks" Mobility   Outcome Measure  Help needed turning from your back to your side while in a flat bed without using bedrails?: A Little Help needed moving from lying on your back to sitting on the side of a flat bed without using bedrails?: A Little Help needed moving to and from a bed to a chair (including a wheelchair)?: A Little Help needed standing up from a chair using your arms (e.g., wheelchair or bedside chair)?: A Little Help needed to walk in hospital room?: A Little Help needed climbing 3-5 steps with a railing? : A Lot 6 Click Score: 17    End of Session   Activity Tolerance: Patient tolerated treatment well Patient left: in bed;with call bell/phone within reach;with bed alarm set Nurse Communication: Mobility status PT Visit Diagnosis: Muscle weakness (generalized) (M62.81);History of falling (Z91.81);Difficulty in walking, not elsewhere classified (R26.2)     Time: 9233-0076 PT Time Calculation (min) (ACUTE ONLY): 23 min  Charges:  $Gait Training: 8-22 mins $Therapeutic Exercise: 8-22 mins                     Hall Busing, SPT Acute Rehabilitation Office #: [phone]    Hall Busing 12/12/2021, 5:22 PM

## 2021-12-12 NOTE — NC FL2 (Signed)
  [place] LEVEL OF CARE SCREENING TOOL     IDENTIFICATION  Patient Name: Terry [name] Birthdate: [birth year] Sex: male Admission Date (Current Location): 12/07/2021  [facility] and [place] Number:  [employer] and Address:  [facility], [place] [address] [place], [place] [address]      Provider Number: [id number]  Attending Physician Name and Address:  Thurnell Lose, MD  Relative Name and Phone Number:       Current Level of Care: Hospital Recommended Level of Care: [place] Prior Approval Number:    Date Approved/Denied:   PASRR Number: [id number]  Discharge Plan: SNF    Current Diagnoses: Patient Active Problem List   Diagnosis Date Noted   Fall 12/08/2021   Multiple abrasions 12/08/2021   Hematoma 12/08/2021   Syncope 12/07/2021   Encounter for therapeutic drug monitoring 05/09/2017   Chronic anticoagulation 03/01/2016   S/P mitral valve replacement 11/09/2010   H/O mitral valve replacement with mechanical valve 08/03/2010    Orientation RESPIRATION BLADDER Height & Weight     Self, Time, Situation, Place  Normal Continent Weight: 165 lb (74.8 kg) Height:  '5\' 11"'$  (180.3 cm)  BEHAVIORAL SYMPTOMS/MOOD NEUROLOGICAL BOWEL NUTRITION STATUS      Continent Diet (regular)  AMBULATORY STATUS COMMUNICATION OF NEEDS Skin   Limited Assist Verbally Skin abrasions, Bruising                       Personal Care Assistance Level of Assistance  Bathing, Feeding, Dressing Bathing Assistance: Limited assistance Feeding assistance: Limited assistance Dressing Assistance: Limited assistance     Functional Limitations Info  Hearing, Sight Sight Info: Adequate (left eye trauma) Hearing Info: Impaired (hearing aid)      [place]  PT (By licensed PT), OT (By licensed OT)     PT Frequency: 5x/wk OT Frequency: 5x/wk            Contractures Contractures Info: Not present     Additional Factors Info  Code Status, Allergies Code Status Info: DNR Allergies Info: Cipro (Ciprofloxacin Hcl), Sulfa Drugs Cross Reactors           Current Medications (12/12/2021):  This is the current hospital active medication list Current Facility-Administered Medications  Medication Dose Route Frequency Provider Last Rate Last Admin   acetaminophen (TYLENOL) tablet 500 mg  500 mg Oral Q6H PRN Thurnell Lose, MD   500 mg at 12/11/21 1742   metoprolol tartrate (LOPRESSOR) tablet 25 mg  25 mg Oral BID Thurnell Lose, MD   25 mg at 12/12/21 1033   rosuvastatin (CRESTOR) tablet 20 mg  20 mg Oral Daily Jonetta Osgood, MD   20 mg at 12/12/21 1033   silver nitrate applicators applicator 10 Stick  10 Stick Topical PRN Thurnell Lose, MD   10 Stick at 12/11/21 1849     Discharge Medications: Please see discharge summary for a list of discharge medications.  Relevant Imaging Results:  Relevant Lab Results:   Additional Information SS#: [id number]  Geralynn Ochs, LCSW

## 2021-12-12 NOTE — Care Management Important Message (Signed)
Important Message  Patient Details  Name: Terry [name] MRN: [id number] Date of Birth: 05/05/1936   Medicare Important Message Given:  Yes     Orbie Pyo 12/12/2021, 2:07 PM

## 2021-12-12 NOTE — Progress Notes (Signed)
Occupational Therapy Treatment Patient Details Name: Terry [name] MRN: [id number] DOB: Sep 20, 1935 Today's Date: 12/12/2021   History of present illness 86 y/o male presented to ED on 12/07/21 after fall in parking lot. Had a syncopal event in ED. 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 PMH: mitral valve replacement, HTN   OT comments  Pt progressing towards goals, able to complete standing grooming task min guard A, mod A for LB dressing -- limited by L hip pain when reaching forward. Pt eager for mobility, completes hallway ambulation with min guard A and RW. Pt continues to present difficulty with fine motor skills due to bandages, however has adapted well to self feeding/grooming tasks. Pt presenting with impairments listed below, will follow acutely. Continue to recommend SNF.   Recommendations for follow up therapy are one component of a multi-disciplinary discharge planning process, led by the attending physician.  Recommendations may be updated based on patient status, additional functional criteria and insurance authorization.    Follow Up Recommendations  Skilled nursing-short term rehab (<3 hours/day)    Assistance Recommended at Discharge Intermittent Supervision/Assistance  Patient can return home with the following  A little help with walking and/or transfers;Assistance with cooking/housework;Direct supervision/assist for medications management;Direct supervision/assist for financial management;Assist for transportation;Help with stairs or ramp for entrance;A lot of help with bathing/dressing/bathroom   Equipment Recommendations  None recommended by OT;Other (comment) (defer)    Recommendations for Other Services PT consult    Precautions / Restrictions Precautions Precautions: Fall Precaution Comments: watch BP (+orthostatics but assymptomatic) Restrictions Weight Bearing Restrictions: No       Mobility Bed Mobility               General bed  mobility comments: Up in recliner on arrival and departure    Transfers Overall transfer level: Needs assistance Equipment used: Rolling walker (2 wheels) Transfers: Sit to/from Stand Sit to Stand: Min assist     Step pivot transfers: Min guard     General transfer comment: assist for balance and anterior weight shift     Balance Overall balance assessment: Needs assistance Sitting-balance support: Feet supported Sitting balance-Leahy Scale: Good Sitting balance - Comments: Reaching to feet sitting edge of recliner.   Standing balance support: Reliant on assistive device for balance, Single extremity supported Standing balance-Leahy Scale: Poor Standing balance comment: needing at least 1 UE support or min A when transitioning hands to sink in the room                           ADL either performed or assessed with clinical judgement   ADL Overall ADL's : Needs assistance/impaired     Grooming: Min guard;Standing;Oral care               Lower Body Dressing: Moderate assistance;Sitting/lateral leans Lower Body Dressing Details (indicate cue type and reason): to don socks, pt able to doff bil socks ind, needing increased time due to L hip pain             Functional mobility during ADLs: Min guard;Rolling walker (2 wheels)      Extremity/Trunk Assessment Upper Extremity Assessment Upper Extremity Assessment: Generalized weakness (Impaired FM skills, bil UE bandaged, L elbow bandaged)   Lower Extremity Assessment Lower Extremity Assessment: Defer to PT evaluation        Vision   Additional Comments: pt reporting vision has not changed from baseline, [facility] for ADLs and hallway mobility  Perception Perception Perception: Not tested   Praxis Praxis Praxis: Not tested    Cognition Arousal/Alertness: Awake/alert Behavior During Therapy: WFL for tasks assessed/performed Overall Cognitive Status: Within Functional Limits for tasks assessed                                  General Comments: Pt with slightly decreased processing speed, reporting that he feels a bit off from his baseline. However, cognition WFL for tasks assessed.        Exercises      Shoulder Instructions       General Comments VSS on RA    Pertinent Vitals/ Pain       Pain Assessment Pain Assessment: Faces Pain Score: 2  Faces Pain Scale: Hurts a little bit Pain Location: L hip Pain Descriptors / Indicators: Discomfort, Grimacing, Guarding Pain Intervention(s): Limited activity within patient's tolerance, Repositioned, Monitored during session  Home Living                                          Prior Functioning/Environment              Frequency  Min 2X/week        Progress Toward Goals  OT Goals(current goals can now be found in the care plan section)  Progress towards OT goals: Progressing toward goals  Acute Rehab OT Goals Patient Stated Goal: to get well enough to volunteer again OT Goal Formulation: With patient Time For Goal Achievement: 12/22/21 Potential to Achieve Goals: Good ADL Goals Pt Will Perform Upper Body Dressing: with modified independence;sitting Pt Will Perform Lower Body Dressing: with modified independence;sitting/lateral leans;sit to/from stand Pt Will Transfer to Toilet: with modified independence;regular height toilet;ambulating Pt Will Perform Tub/Shower Transfer: Shower transfer;shower seat;ambulating;with modified independence Additional ADL Goal #1: pt will complete bed mobility with mod I in prep for ADLs  Plan Discharge plan remains appropriate    Co-evaluation                 AM-PAC OT "6 Clicks" Daily Activity     Outcome Measure   Help from another person eating meals?: A Little Help from another person taking care of personal grooming?: A Little Help from another person toileting, which includes using toliet, bedpan, or urinal?: A Little Help from  another person bathing (including washing, rinsing, drying)?: A Lot Help from another person to put on and taking off regular upper body clothing?: A Little Help from another person to put on and taking off regular lower body clothing?: A Lot 6 Click Score: 16    End of Session Equipment Utilized During Treatment: Gait belt;Rolling walker (2 wheels)  OT Visit Diagnosis: Other abnormalities of gait and mobility (R26.89);Unsteadiness on feet (R26.81);Muscle weakness (generalized) (M62.81)   Activity Tolerance Patient tolerated treatment well   Patient Left in chair;with call bell/phone within reach   Nurse Communication Mobility status        Time: 3662-9476 OT Time Calculation (min): 21 min  Charges: OT General Charges $OT Visit: 1 Visit OT Treatments $Self Care/Home Management : 8-22 mins  Lynnda Child, OTD, OTR/L Acute Rehab [phone]) [phone] - [place] 12/12/2021, 12:16 PM

## 2021-12-12 NOTE — Plan of Care (Signed)

## 2021-12-12 NOTE — Progress Notes (Signed)
PROGRESS NOTE        PATIENT DETAILS Name: Terry [name] Age: 86 y.o. Sex: male Date of Birth: 09-15-1935 Admit Date: 12/07/2021 Admitting Physician Orene Desanctis, DO FHL:[id number], [name], MD  Brief Summary: Patient is a 86 y.o.  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  See below for further details.   Significant events: 7/28>> fall at [facility] tears on bilateral arms/face-s/p suturing/combat gauze/pressure-subsequently when attempting to ambulate-he syncopized.  Admit to [facility]. 7/29>> orthostatic vital signs positive  Significant studies: 7/28>> x-ray left forearm: No fracture/dislocation.  Soft tissue swelling over the dorsum. 7/28>> x-ray right hand: No fracture. 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 7/28>> CT maxillofacial: Left periorbital soft tissue swelling without acute fracture.  7/29 >>  MRI - 1. No acute intracranial abnormality. 2. Small left periorbital/facial contusion. 3. Chronic encephalomalacia of with hemosiderin staining at the right occipital lobe, consistent with remote hemorrhagic infarct and/or bleed at this location. 4. Generalized age-related cerebral atrophy with chronic small vessel ischemic disease, with a few scattered remote lacunar infarcts involving the pons/midbrain and cerebellum. Overall, changes are progressed as compared to 2017. 5. Multiple scattered chronic micro hemorrhages involving both cerebral and cerebellar hemispheres. Findings are nonspecific, and could be related to chronic underlying hypertension or possibly cerebral amyloid  angiopathy  7/29 >> TTE -  1. Study is not well visualized. Left ventricular ejection fraction, by estimation, is 60 to 65%. The left ventricle has normal function. The left ventricle has no regional wall motion abnormalities. Left ventricular diastolic parameters are consistent with Grade I diastolic dysfunction (impaired relaxation).  2. Right ventricular systolic function is normal. The right ventricular size is not well visualized. Tricuspid regurgitation signal is inadequate for assessing PA pressure.  3. S/p mechanical MVR 2003. MV mean gradient 4 mmHg at HR 92 bpm. No significant stenosis; normal prosthesis. No evidence of mitral valve regurgitation.  4. The aortic valve was not well visualized. Aortic valve regurgitation is not visualized. No aortic stenosis is present.  5. The inferior vena cava is normal in size with greater than 50% respiratory variability, suggesting right atrial pressure of 3 mmHg  Significant microbiology data: None  Procedures: None  Consults:  None  Subjective: Patient in bed, appears comfortable, denies any headache, no fever, no chest pain or pressure, no shortness of breath , no abdominal pain. No new focal weakness.    Objective: Vitals: Blood pressure 113/62, pulse (!) 101, temperature 98.2 F (36.8 C), temperature source Oral, resp. rate 14, height '5\' 11"'$  (1.803 m), weight 74.8 kg, SpO2 98 %.   Exam:  Alert awake-not in any distress.  Left periorbital ecchymosis present.  Numerous bandages present-did not open. [place].AT,PERRAL Supple Neck, No JVD,   Symmetrical Chest wall movement, Good air movement bilaterally, CTAB RRR,No Gallops, Rubs or new Murmurs,  +ve B.Sounds, Abd Soft, No tenderness,   No Cyanosis, Clubbing or edema     Assessment/Plan:  Syncope: Likely orthostatic hypotension-sustained a fall at home and incurred severe left periorbital bruise and  laceration, multiple wounds in upper extremities requiring bandages, lives by himself,  also on Coumadin with high INR.  Overall weak and frail do not think he is safe for discharge by himself to home at this point, continue PT OT, advance activity, echo, MRI nonacute.  Orthostatics have improved, stable on telemetry, looking for SNF bed.  Mechanical fall with soft tissue injury involving left pelvic periorbital area/bilateral upper extremities with bleeding and acute blood loss anemia: Supportive care-mobilize with PT/OT.  Per patient he is updated with his tetanus vaccine.  Note-he required suture to his left forearm small arterial bleeding.  He is still bleeding intermittently during dressing changes and H&H is now gradually falling, will stop his Coumadin on 12/11/2021 and once INR drops below 2.5 we will start heparin drip without bolus with caution.  Transfuse 1 unit of packed RBC on 12/12/2021 and continue to monitor H&H along with INR/PTT.  Mechanical mitral valve replacement: On Coumadin, Goal INR 2.5-3.5, plz see anticoagulation discussion above.  Low-density changes seen on occipital lobe on CT imaging: MRI brain nonacute.  HTN: Blood pressure is improved low-dose beta-blocker added.  HLD: On statin  BMI: Estimated body mass index is 23.01 kg/m as calculated from the following:   Height as of this encounter: '5\' 11"'$  (1.803 m).   Weight as of this encounter: 74.8 kg.   Code status:   Code Status: DNR   DVT Prophylaxis:Place TED hose Start: 12/09/21 0849 Place TED hose Start: 12/08/21 1008    Family Communication: None at bedside   Disposition Plan: Status is: Observation The patient will require care spanning > 2 midnights and should be moved to inpatient because: Ongoing orthostatic hypotension-continuing gentle hydration for a few more hours-holding all antihypertensives-not yet stable for discharge.   Planned Discharge Destination:Home likely on 7/30   Diet: Diet Order             Diet regular Room service appropriate? Yes; Fluid consistency: Thin  Diet  effective now                   MEDICATIONS: Scheduled Meds:  sodium chloride   Intravenous Once   metoprolol tartrate  25 mg Oral BID   rosuvastatin  20 mg Oral Daily   Continuous Infusions:   PRN Meds:.   I have personally reviewed following labs and imaging studies  LABORATORY DATA:  Recent Labs  Lab 12/07/21 2248 12/08/21 0328 12/09/21 3536 12/10/21 0327 12/11/21 0229 12/12/21 0647  WBC 12.6* 11.7* 10.1 9.5 9.4 8.5  HGB 12.7* 11.5* 10.4* 9.5* 8.5* 7.8*  HCT 39.2 35.3* 30.8* 29.1* 26.6* 23.5*  PLT 182 165 172 162 193 215  MCV 98.5 97.0 96.9 96.7 97.4 97.1  MCH 31.9 31.6 32.7 31.6 31.1 32.2  MCHC 32.4 32.6 33.8 32.6 32.0 33.2  RDW 12.9 12.9 13.1 13.0 13.1 13.2  LYMPHSABS 1.5  --   --   --   --   --   MONOABS 1.4*  --   --   --   --   --   EOSABS 0.0  --   --   --   --   --   BASOSABS 0.0  --   --   --   --   --     Recent Labs  Lab 12/07/21 1355 12/08/21 0328 12/09/21 0658 12/10/21 0327 12/11/21 0229 12/12/21 0647  NA 141  --   --  138 138 137  K 3.9  --   --  3.9 4.0 3.7  CL 106  --   --  105 104 105  CO2 26  --   --  '26 28 26  '$ GLUCOSE 139*  --   --  98 110* 94  BUN 22  --   --  '22 23 23  '$ CREATININE 0.98  --   --  0.82 0.84 0.83  CALCIUM 8.7*  --   --  8.2* 8.3* 8.1*  MG  --   --   --  1.9 2.1 1.9  INR 3.5* 3.2* 3.4* 3.2* 3.0* 2.8*  BNP  --   --   --  48.0 64.8 115.5*    RADIOLOGY STUDIES/RESULTS: No results found.   LOS: 3 days   Signature  Lala Lund M.D on 12/12/2021 at 11:16 AM   -  To page go to [email or web address]

## 2021-12-12 NOTE — Progress Notes (Signed)
ANTICOAGULATION CONSULT NOTE - Follow Up Consult  Pharmacy Consult for Begin Heparin drip when INR < 2.5 (Warfarin on hold) Indication:  mechanical mitral valve  Allergies  Allergen Reactions   Cipro [Ciprofloxacin Hcl] Other (See Comments)    Heartburn    Sulfa Drugs Cross Reactors Hives    Patient Measurements: Height: '5\' 11"'$  (180.3 cm) Weight: 74.8 kg (165 lb) IBW/kg (Calculated) : 75.3 Heparin Dosing Weight: 75 kg  Vital Signs: Temp: 98.1 F (36.7 C) (08/02 1342) Temp Source: Oral (08/02 1342) BP: 116/67 (08/02 1342) Pulse Rate: 100 (08/02 1342)  Labs: Recent Labs    12/10/21 0327 12/11/21 0229 12/12/21 0647  HGB 9.5* 8.5* 7.8*  HCT 29.1* 26.6* 23.5*  PLT 162 193 215  LABPROT 32.8* 31.1* 28.9*  INR 3.2* 3.0* 2.8*  CREATININE 0.82 0.84 0.83    Estimated Creatinine Clearance: 70.1 mL/min (by C-G formula based on SCr of 0.83 mg/dL).  Assessment: 86 y/o M on warfarin PTA for mechanical MVR. S/p fall PTA with significant skin tears that took multiple hours to stop bleeding. Periorbital hematoma and left lateral thigh hematoma noted. No bleeding on CT head.  . INR 3.5 on admit and warfarin initially continued.  Warfarin is now on hold and IV heparin without bolus and with caution is to start when INR < 2.5.  Last warfarin dose 5 mg on 7/31.  - INR down to 2.8 today.  - Hgb trended down to 7.8 > 1 unit PRBCs given today.  Noted intermittent bleeding during dressing changes; required suture to left forearm.   PTA warfarin regimen: 5 mg Mon/Tue/Wed/Thu/Sat, [name]; INR goal 2.5-3.5.  Goal of Therapy:  Heparin level 0.3-0.5 units/ml (low therapeutic) Monitor platelets by anticoagulation protocol: Yes   Plan:  Heparin to begin when INR < 2.5.  No heparin today with INR 2.8 Warfarin on hold. Daily PT/INR, CBC.  Monitor bleeding status.  Arty Baumgartner, [place] 12/12/2021,1:46 PM

## 2021-12-13 DIAGNOSIS — R55 Syncope and collapse: Secondary | ICD-10-CM | POA: Diagnosis not present

## 2021-12-13 LAB — TYPE AND SCREEN
ABO/RH(D): A POS
Antibody Screen: NEGATIVE
Unit division: 0

## 2021-12-13 LAB — BPAM RBC
Blood Product Expiration Date: 202308292359
ISSUE DATE / TIME: 202308021347
Unit Type and Rh: 6200

## 2021-12-13 LAB — CBC
HCT: 27 % — ABNORMAL LOW (ref 39.0–52.0)
Hemoglobin: 8.8 g/dL — ABNORMAL LOW (ref 13.0–17.0)
MCH: 31.9 pg (ref 26.0–34.0)
MCHC: 32.6 g/dL (ref 30.0–36.0)
MCV: 97.8 fL (ref 80.0–100.0)
Platelets: 221 10*3/uL (ref 150–400)
RBC: 2.76 MIL/uL — ABNORMAL LOW (ref 4.22–5.81)
RDW: 13.7 % (ref 11.5–15.5)
WBC: 8.7 10*3/uL (ref 4.0–10.5)
nRBC: 0 % (ref 0.0–0.2)

## 2021-12-13 LAB — BASIC METABOLIC PANEL
Anion gap: 6 (ref 5–15)
BUN: 24 mg/dL — ABNORMAL HIGH (ref 8–23)
CO2: 26 mmol/L (ref 22–32)
Calcium: 8.2 mg/dL — ABNORMAL LOW (ref 8.9–10.3)
Chloride: 106 mmol/L (ref 98–111)
Creatinine, Ser: 0.82 mg/dL (ref 0.61–1.24)
GFR, Estimated: >60 mL/min (ref >60)
Glucose, Bld: 87 mg/dL (ref 70–99)
Potassium: 3.9 mmol/L (ref 3.5–5.1)
Sodium: 138 mmol/L (ref 135–145)

## 2021-12-13 LAB — HEPARIN LEVEL (UNFRACTIONATED): Heparin Unfractionated: 0.25 IU/mL — ABNORMAL LOW (ref 0.30–0.70)

## 2021-12-13 LAB — MAGNESIUM: Magnesium: 2.1 mg/dL (ref 1.7–2.4)

## 2021-12-13 LAB — PROTIME-INR
INR: 2.1 — ABNORMAL HIGH (ref 0.8–1.2)
Prothrombin Time: 23.2 seconds — ABNORMAL HIGH (ref 11.4–15.2)

## 2021-12-13 LAB — BRAIN NATRIURETIC PEPTIDE: B Natriuretic Peptide: 165 pg/mL — ABNORMAL HIGH (ref 0.0–100.0)

## 2021-12-13 MED ORDER — HEPARIN (PORCINE) 25000 UT/250ML-% IV SOLN
900.0000 [IU]/h | INTRAVENOUS | Status: DC
  Administered 2021-12-13: 900 [IU]/h via INTRAVENOUS
  Filled 2021-12-13: qty 250

## 2021-12-13 MED ORDER — HEPARIN (PORCINE) 25000 UT/250ML-% IV SOLN
1150.0000 [IU]/h | INTRAVENOUS | Status: DC
  Administered 2021-12-14: 1000 [IU]/h via INTRAVENOUS
  Administered 2021-12-15: 1100 [IU]/h via INTRAVENOUS
  Administered 2021-12-16 – 2021-12-17 (×2): 1050 [IU]/h via INTRAVENOUS
  Administered 2021-12-18: 1150 [IU]/h via INTRAVENOUS
  Filled 2021-12-13 (×6): qty 250

## 2021-12-13 NOTE — Plan of Care (Signed)
Patient remains in Neuro PCU overnight.   Problem: Education: Goal: Knowledge of General Education information will improve Description: Including pain rating scale, medication(s)/side effects and non-pharmacologic comfort measures Outcome: Progressing   Problem: Health Behavior/Discharge Planning: Goal: Ability to manage health-related needs will improve Outcome: Progressing   Problem: Clinical Measurements: Goal: Ability to maintain clinical measurements within normal limits will improve Outcome: Progressing Goal: Will remain free from infection Outcome: Progressing Goal: Diagnostic test results will improve Outcome: Progressing Goal: Respiratory complications will improve Outcome: Progressing Goal: Cardiovascular complication will be avoided Outcome: Progressing   Problem: Activity: Goal: Risk for activity intolerance will decrease Outcome: Progressing   Problem: Nutrition: Goal: Adequate nutrition will be maintained Outcome: Progressing   Problem: Coping: Goal: Level of anxiety will decrease Outcome: Progressing   Problem: Elimination: Goal: Will not experience complications related to bowel motility Outcome: Progressing Goal: Will not experience complications related to urinary retention Outcome: Progressing   Problem: Pain Managment: Goal: General experience of comfort will improve Outcome: Progressing   Problem: Safety: Goal: Ability to remain free from injury will improve Outcome: Progressing   Problem: Skin Integrity: Goal: Risk for impaired skin integrity will decrease Outcome: Progressing

## 2021-12-13 NOTE — Progress Notes (Signed)
ANTICOAGULATION CONSULT NOTE - Follow Up Consult  Pharmacy Consult for heparin Indication:  MVR  Allergies  Allergen Reactions   Cipro [Ciprofloxacin Hcl] Other (See Comments)    Heartburn    Sulfa Drugs Cross Reactors Hives    Patient Measurements: Height: '5\' 11"'$  (180.3 cm) Weight: 74.8 kg (165 lb) IBW/kg (Calculated) : 75.3  Vital Signs: Temp: 97.9 F (36.6 C) (08/03 0732) Temp Source: Oral (08/03 0732) BP: 103/60 (08/03 0732) Pulse Rate: 107 (08/03 0732)  Labs: Recent Labs    12/11/21 0229 12/12/21 0647 12/12/21 2058 12/13/21 0603  HGB 8.5* 7.8* 8.9* 8.8*  HCT 26.6* 23.5* 26.7* 27.0*  PLT 193 215  --  221  LABPROT 31.1* 28.9*  --  23.2*  INR 3.0* 2.8*  --  2.1*  CREATININE 0.84 0.83  --   --     Estimated Creatinine Clearance: 70.1 mL/min (by C-G formula based on SCr of 0.83 mg/dL).   Medications:  Medications Prior to Admission  Medication Sig Dispense Refill Last Dose   aspirin 81 MG tablet Take 81 mg by mouth daily.   12/07/2021   Calcium Carbonate-Vitamin D (CALCIUM + D PO) Take 600 mg by mouth daily.   12/07/2021   Cholecalciferol (VITAMIN D3) 1000 UNITS tablet Take 1,000 Units by mouth daily.   12/07/2021   COSOPT PF 22.3-6.8 MG/ML SOLN ophthalmic solution Place 1 drop into both eyes 2 (two) times daily.  3 12/07/2021   metoprolol succinate (TOPROL-XL) 100 MG 24 hr tablet TAKE 1 AND 1/2 TABLETS BY MOUTH DAILY (Patient taking differently: Take 150 mg by mouth daily.) 135 tablet 2 12/07/2021 at 0600   multivitamin [facility]) per tablet Take 1 tablet by mouth daily.   12/07/2021   omeprazole (PRILOSEC OTC) 20 MG tablet Take 20 mg by mouth every other day.   12/07/2021   propranolol (INDERAL) 10 MG tablet Take 10 mg by mouth daily as needed (For SVT).   unknown at 0630   rosuvastatin (CRESTOR) 20 MG tablet Take 20 mg by mouth daily.   12/08/2021   warfarin (COUMADIN) 5 MG tablet TAKE AS DIRECTED BY ANTICOAGULATION CLINIC (Patient taking differently: Take 2.5-5 mg  by mouth daily. Take 2.5 mg by mouth on on Monday and Fridays, then take 5 mg by mouth rest of days per patient) 90 tablet 1 12/08/2021 at 0630   ZIOPTAN 0.0015 % SOLN Place 1 drop into both eyes at bedtime.  3 Past Week   Scheduled:   metoprolol tartrate  25 mg Oral BID   rosuvastatin  20 mg Oral Daily    Assessment: 86yo male to start heparin bridge while Coumadin on hold, INR now <2.5; Hgb back up to 8.8 after transfusion.  Goal of Therapy:  Heparin level 0.3-0.5 units/ml Monitor platelets by anticoagulation protocol: Yes   Plan:  Heparin 900 units/hr. Monitor heparin levels and CBC.  Wynona Neat, PharmD, BCPS  12/13/2021,7:36 AM

## 2021-12-13 NOTE — Progress Notes (Signed)
ANTICOAGULATION CONSULT NOTE - Follow Up Consult  Pharmacy Consult for heparin Indication:  MVR  Allergies  Allergen Reactions   Cipro [Ciprofloxacin Hcl] Other (See Comments)    Heartburn    Sulfa Drugs Cross Reactors Hives    Patient Measurements: Height: '5\' 11"'$  (180.3 cm) Weight: 74.8 kg (165 lb) IBW/kg (Calculated) : 75.3  Vital Signs: Temp: 99.4 F (37.4 C) (08/03 1545) Temp Source: Oral (08/03 1545) BP: 119/61 (08/03 1545) Pulse Rate: 102 (08/03 1545)  Labs: Recent Labs    12/11/21 0229 12/12/21 0647 12/12/21 2058 12/13/21 0603 12/13/21 1548  HGB 8.5* 7.8* 8.9* 8.8*  --   HCT 26.6* 23.5* 26.7* 27.0*  --   PLT 193 215  --  221  --   LABPROT 31.1* 28.9*  --  23.2*  --   INR 3.0* 2.8*  --  2.1*  --   HEPARINUNFRC  --   --   --   --  0.25*  CREATININE 0.84 0.83  --  0.82  --      Estimated Creatinine Clearance: 70.9 mL/min (by C-G formula based on SCr of 0.82 mg/dL).   Medications:  Medications Prior to Admission  Medication Sig Dispense Refill Last Dose   aspirin 81 MG tablet Take 81 mg by mouth daily.   12/07/2021   Calcium Carbonate-Vitamin D (CALCIUM + D PO) Take 600 mg by mouth daily.   12/07/2021   Cholecalciferol (VITAMIN D3) 1000 UNITS tablet Take 1,000 Units by mouth daily.   12/07/2021   COSOPT PF 22.3-6.8 MG/ML SOLN ophthalmic solution Place 1 drop into both eyes 2 (two) times daily.  3 12/07/2021   metoprolol succinate (TOPROL-XL) 100 MG 24 hr tablet TAKE 1 AND 1/2 TABLETS BY MOUTH DAILY (Patient taking differently: Take 150 mg by mouth daily.) 135 tablet 2 12/07/2021 at 0600   multivitamin [facility]) per tablet Take 1 tablet by mouth daily.   12/07/2021   omeprazole (PRILOSEC OTC) 20 MG tablet Take 20 mg by mouth every other day.   12/07/2021   propranolol (INDERAL) 10 MG tablet Take 10 mg by mouth daily as needed (For SVT).   unknown at 0630   rosuvastatin (CRESTOR) 20 MG tablet Take 20 mg by mouth daily.   12/08/2021   warfarin (COUMADIN) 5 MG tablet  TAKE AS DIRECTED BY ANTICOAGULATION CLINIC (Patient taking differently: Take 2.5-5 mg by mouth daily. Take 2.5 mg by mouth on on Monday and Fridays, then take 5 mg by mouth rest of days per patient) 90 tablet 1 12/08/2021 at 0630   ZIOPTAN 0.0015 % SOLN Place 1 drop into both eyes at bedtime.  3 Past Week   Scheduled:   metoprolol tartrate  25 mg Oral BID   rosuvastatin  20 mg Oral Daily    Assessment: 86yo male to start heparin bridge while Coumadin on hold, INR now <2.5; Hgb back up to 8.8 after transfusion.  PM update: HL 0.25  Goal of Therapy:  Heparin level 0.3-0.5 units/ml Monitor platelets by anticoagulation protocol: Yes   Plan:  Increase Heparin to 1000 units/hr. Heparin level in 8 hours Monitor heparin levels and CBC.  Mirranda Monrroy A. Levada Dy, PharmD, BCPS, FNKF Clinical Pharmacist [place] Please utilize Amion for appropriate phone number to reach the unit pharmacist ([place])  12/13/2021,4:48 PM

## 2021-12-13 NOTE — Progress Notes (Signed)
PROGRESS NOTE        PATIENT DETAILS Name: Terry [name] Age: 86 y.o. Sex: male Date of Birth: 01-25-1936 Admit Date: 12/07/2021 Admitting Physician Orene Desanctis, DO BUL:[id number], [name], MD  Brief Summary: Patient is a 86 y.o.  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  See below for further details.   Significant events: 7/28>> fall at [facility] tears on bilateral arms/face-s/p suturing/combat gauze/pressure-subsequently when attempting to ambulate-he syncopized.  Admit to [facility]. 7/29>> orthostatic vital signs positive  Significant studies: 7/28>> x-ray left forearm: No fracture/dislocation.  Soft tissue swelling over the dorsum. 7/28>> x-ray right hand: No fracture. 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 7/28>> CT maxillofacial: Left periorbital soft tissue swelling without acute fracture.  7/29 >>  MRI - 1. No acute intracranial abnormality. 2. Small left periorbital/facial contusion. 3. Chronic encephalomalacia of with hemosiderin staining at the right occipital lobe, consistent with remote hemorrhagic infarct and/or bleed at this location. 4. Generalized age-related cerebral atrophy with chronic small vessel ischemic disease, with a few scattered remote lacunar infarcts involving the pons/midbrain and cerebellum. Overall, changes are progressed as compared to 2017. 5. Multiple scattered chronic micro hemorrhages involving both cerebral and cerebellar hemispheres. Findings are nonspecific, and could be related to chronic underlying hypertension or possibly cerebral amyloid  angiopathy  7/29 >> TTE -  1. Study is not well visualized. Left ventricular ejection fraction, by estimation, is 60 to 65%. The left ventricle has normal function. The left ventricle has no regional wall motion abnormalities. Left ventricular diastolic parameters are consistent with Grade I diastolic dysfunction (impaired relaxation).  2. Right ventricular systolic function is normal. The right ventricular size is not well visualized. Tricuspid regurgitation signal is inadequate for assessing PA pressure.  3. S/p mechanical MVR 2003. MV mean gradient 4 mmHg at HR 92 bpm. No significant stenosis; normal prosthesis. No evidence of mitral valve regurgitation.  4. The aortic valve was not well visualized. Aortic valve regurgitation is not visualized. No aortic stenosis is present.  5. The inferior vena cava is normal in size with greater than 50% respiratory variability, suggesting right atrial pressure of 3 mmHg  Significant microbiology data: None  Procedures: None  Consults:  None  Subjective: Patient in bed, appears comfortable, denies any headache, no fever, no chest pain or pressure, no shortness of breath , no abdominal pain. No new focal weakness.  Objective: Vitals: Blood pressure 103/60, pulse (!) 107, temperature 97.9 F (36.6 C), temperature source Oral, resp. rate 19, height '5\' 11"'$  (1.803 m), weight 74.8 kg, SpO2 99 %.   Exam:  Alert awake-not in any distress.  Left periorbital ecchymosis present.  Numerous bandages present-did not open. [place].AT,PERRAL Supple Neck, No JVD,   Symmetrical Chest wall movement, Good air movement bilaterally, CTAB RRR,No Gallops, Rubs or new Murmurs,  +ve B.Sounds, Abd Soft, No tenderness,   No Cyanosis,      Assessment/Plan:  Syncope: Likely orthostatic hypotension-sustained a fall at home and incurred severe left periorbital bruise and laceration, multiple wounds in  upper extremities requiring bandages, lives by himself, also on Coumadin with  high INR.  Overall weak and frail do not think he is safe for discharge by himself to home at this point, continue PT OT, advance activity, echo, MRI nonacute.  Orthostatics have improved, stable on telemetry, looking for SNF bed.  Mechanical fall with soft tissue injury involving left pelvic periorbital area/bilateral upper extremities with bleeding and acute blood loss anemia: Supportive care-mobilize with PT/OT.  Per patient he is updated with his tetanus vaccine.  Note-he required suture to his left forearm small arterial bleeding.  He is still bleeding intermittently during dressing changes and H&H is now gradually falling, will stop his Coumadin on 12/11/2021 currently on heparin drip.  Transfused 1 unit of packed RBC on 12/12/2021 and continue to monitor H&H along with INR/PTT.  Mechanical mitral valve replacement: On Coumadin, Goal INR 2.5-3.5, plz see anticoagulation discussion above.  Low-density changes seen on occipital lobe on CT imaging: MRI brain nonacute.  HTN: Blood pressure is improved low-dose beta-blocker added.  HLD: On statin  BMI: Estimated body mass index is 23.01 kg/m as calculated from the following:   Height as of this encounter: '5\' 11"'$  (1.803 m).   Weight as of this encounter: 74.8 kg.   Code status:   Code Status: DNR   DVT Prophylaxis:Place TED hose Start: 12/09/21 0849 Place TED hose Start: 12/08/21 1008    Family Communication: None at bedside   Disposition Plan: Status is: Observation The patient will require care spanning > 2 midnights and should be moved to inpatient because: Ongoing orthostatic hypotension-continuing gentle hydration for a few more hours-holding all antihypertensives-not yet stable for discharge.   Planned Discharge Destination:Home likely on 7/30   Diet: Diet Order             Diet regular Room service appropriate? Yes; Fluid consistency: Thin  Diet effective now                   MEDICATIONS: Scheduled Meds:   metoprolol tartrate  25 mg Oral BID   rosuvastatin  20 mg Oral Daily   Continuous Infusions:  heparin 900 Units/hr (12/13/21 0803)    PRN Meds:.   I have personally reviewed following labs and imaging studies  LABORATORY DATA:  Recent Labs  Lab 12/07/21 2248 12/08/21 0328 12/09/21 6283 12/10/21 0327 12/11/21 0229 12/12/21 1517 12/12/21 2058 12/13/21 0603  WBC 12.6*   < > 10.1 9.5 9.4 8.5  --  8.7  HGB 12.7*   < > 10.4* 9.5* 8.5* 7.8* 8.9* 8.8*  HCT 39.2   < > 30.8* 29.1* 26.6* 23.5* 26.7* 27.0*  PLT 182   < > 172 162 193 215  --  221  MCV 98.5   < > 96.9 96.7 97.4 97.1  --  97.8  MCH 31.9   < > 32.7 31.6 31.1 32.2  --  31.9  MCHC 32.4   < > 33.8 32.6 32.0 33.2  --  32.6  RDW 12.9   < > 13.1 13.0 13.1 13.2  --  13.7  LYMPHSABS 1.5  --   --   --   --   --   --   --   MONOABS 1.4*  --   --   --   --   --   --   --   EOSABS 0.0  --   --   --   --   --   --   --  BASOSABS 0.0  --   --   --   --   --   --   --    < > = values in this interval not displayed.    Recent Labs  Lab 12/07/21 1355 12/08/21 0328 12/09/21 3968 12/10/21 0327 12/11/21 0229 12/12/21 0647 12/13/21 0603  NA 141  --   --  138 138 137 138  K 3.9  --   --  3.9 4.0 3.7 3.9  CL 106  --   --  105 104 105 106  CO2 26  --   --  '26 28 26 26  '$ GLUCOSE 139*  --   --  98 110* 94 87  BUN 22  --   --  '22 23 23 '$ 24*  CREATININE 0.98  --   --  0.82 0.84 0.83 0.82  CALCIUM 8.7*  --   --  8.2* 8.3* 8.1* 8.2*  MG  --   --   --  1.9 2.1 1.9 2.1  INR 3.5*   < > 3.4* 3.2* 3.0* 2.8* 2.1*  BNP  --   --   --  48.0 64.8 115.5* 165.0*   < > = values in this interval not displayed.    RADIOLOGY STUDIES/RESULTS: No results found.   LOS: 4 days   Signature  Lala Lund M.D on 12/13/2021 at 10:24 AM   -  To page go to [email or web address]

## 2021-12-14 DIAGNOSIS — R55 Syncope and collapse: Secondary | ICD-10-CM | POA: Diagnosis not present

## 2021-12-14 LAB — BASIC METABOLIC PANEL
Anion gap: 5 (ref 5–15)
BUN: 19 mg/dL (ref 8–23)
CO2: 26 mmol/L (ref 22–32)
Calcium: 8.2 mg/dL — ABNORMAL LOW (ref 8.9–10.3)
Chloride: 107 mmol/L (ref 98–111)
Creatinine, Ser: 0.79 mg/dL (ref 0.61–1.24)
GFR, Estimated: >60 mL/min (ref >60)
Glucose, Bld: 100 mg/dL — ABNORMAL HIGH (ref 70–99)
Potassium: 3.7 mmol/L (ref 3.5–5.1)
Sodium: 138 mmol/L (ref 135–145)

## 2021-12-14 LAB — HEPARIN LEVEL (UNFRACTIONATED)
Heparin Unfractionated: 0.38 IU/mL (ref 0.30–0.70)
Heparin Unfractionated: 0.21 IU/mL — ABNORMAL LOW (ref 0.30–0.70)
Heparin Unfractionated: 0.39 IU/mL (ref 0.30–0.70)

## 2021-12-14 LAB — CBC
HCT: 25.4 % — ABNORMAL LOW (ref 39.0–52.0)
Hemoglobin: 8.5 g/dL — ABNORMAL LOW (ref 13.0–17.0)
MCH: 32.1 pg (ref 26.0–34.0)
MCHC: 33.5 g/dL (ref 30.0–36.0)
MCV: 95.8 fL (ref 80.0–100.0)
Platelets: 232 10*3/uL (ref 150–400)
RBC: 2.65 MIL/uL — ABNORMAL LOW (ref 4.22–5.81)
RDW: 14 % (ref 11.5–15.5)
WBC: 9.1 10*3/uL (ref 4.0–10.5)
nRBC: 0 % (ref 0.0–0.2)

## 2021-12-14 LAB — PROTIME-INR
INR: 1.5 — ABNORMAL HIGH (ref 0.8–1.2)
Prothrombin Time: 17.7 seconds — ABNORMAL HIGH (ref 11.4–15.2)

## 2021-12-14 NOTE — Progress Notes (Signed)
Physical Therapy Treatment Patient Details Name: Terry [name] MRN: [id number] DOB: August 04, 1935 Today's Date: 12/14/2021   History of Present Illness 86 y/o male presented to ED on 12/07/21 after fall in parking lot. Had a syncopal event in ED. 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 PMH: mitral valve replacement, HTN    PT Comments    Patient progressing with distance and stability, though still difficulty with turns as painful and mildly unsteady with RW.  Patient feels pain improves with mobilization.  Encouraged him to call for help from nursing staff to walk when desired.  PT to continue to follow.  Reports waiting on meds for anticoagulation to transition prior to discharge.  STSNF remains appropriate.    Recommendations for follow up therapy are one component of a multi-disciplinary discharge planning process, led by the attending physician.  Recommendations may be updated based on patient status, additional functional criteria and insurance authorization.  Follow Up Recommendations  Skilled nursing-short term rehab (<3 hours/day) Can patient physically be transported by private vehicle: Yes   Assistance Recommended at Discharge Intermittent Supervision/Assistance  Patient can return home with the following A little help with walking and/or transfers;A little help with bathing/dressing/bathroom;Assistance with cooking/housework;Assist for transportation;Help with stairs or ramp for entrance   Equipment Recommendations  Rolling walker (2 wheels)    Recommendations for Other Services       Precautions / Restrictions Precautions Precautions: Fall Precaution Comments: watch BP     Mobility  Bed Mobility               General bed mobility comments: up in chair    Transfers Overall transfer level: Needs assistance Equipment used: Rolling walker (2 wheels) Transfers: Sit to/from Stand Sit to Stand: Min guard           General transfer comment:  increased time to stand due to L hip pain    Ambulation/Gait Ambulation/Gait assistance: Min guard Gait Distance (Feet): 160 Feet Assistive device: Rolling walker (2 wheels) Gait Pattern/deviations: Step-through pattern, Decreased stride length, Antalgic, Trunk flexed       General Gait Details: increased time and pain with turning in hallway; minguard for balance   Stairs             Wheelchair Mobility    Modified Rankin (Stroke Patients Only)       Balance Overall balance assessment: Needs assistance   Sitting balance-Leahy Scale: Good     Standing balance support: Bilateral upper extremity supported, During functional activity, Reliant on assistive device for balance Standing balance-Leahy Scale: Poor                              Cognition Arousal/Alertness: Awake/alert Behavior During Therapy: WFL for tasks assessed/performed Overall Cognitive Status: Within Functional Limits for tasks assessed                                          Exercises General Exercises - Lower Extremity Hip ABduction/ADduction: AROM, Both, 10 reps, Standing Hip Flexion/Marching: AROM, Both, 10 reps, Standing Other Exercises Other Exercises: hip extension standing x 10    General Comments General comments (skin integrity, edema, etc.): BP stable with initial standing, not wearing TEDs as reports RN removed for skin health      Pertinent Vitals/Pain Pain Assessment Faces Pain Scale: Hurts even more  Pain Location: L hip with initial sit to stand Pain Descriptors / Indicators: Discomfort, Grimacing, Guarding Pain Intervention(s): Monitored during session, Repositioned    Home Living                          Prior Function            PT Goals (current goals can now be found in the care plan section) Progress towards PT goals: Progressing toward goals    Frequency    Min 3X/week      PT Plan Current plan remains  appropriate    Co-evaluation              AM-PAC PT "6 Clicks" Mobility   Outcome Measure  Help needed turning from your back to your side while in a flat bed without using bedrails?: A Little Help needed moving from lying on your back to sitting on the side of a flat bed without using bedrails?: A Little Help needed moving to and from a bed to a chair (including a wheelchair)?: A Little Help needed standing up from a chair using your arms (e.g., wheelchair or bedside chair)?: A Little Help needed to walk in hospital room?: A Little Help needed climbing 3-5 steps with a railing? : Total 6 Click Score: 16    End of Session Equipment Utilized During Treatment: Gait belt Activity Tolerance: Patient tolerated treatment well Patient left: in chair;with call bell/phone within reach;with chair alarm set   PT Visit Diagnosis: Muscle weakness (generalized) (M62.81);History of falling (Z91.81);Difficulty in walking, not elsewhere classified (R26.2)     Time: 8841-6606 PT Time Calculation (min) (ACUTE ONLY): 23 min  Charges:  $Gait Training: 8-22 mins $Therapeutic Exercise: 8-22 mins                     Terry [name], PT Acute Rehabilitation Services Office:[phone] 12/14/2021    Terry [name] 12/14/2021, 4:39 PM

## 2021-12-14 NOTE — Progress Notes (Signed)
ANTICOAGULATION CONSULT NOTE - Follow Up Consult  Pharmacy Consult for heparin Indication:  MVR  Labs: Recent Labs    12/12/21 0647 12/12/21 2058 12/13/21 0603 12/13/21 1548 12/14/21 0609  HGB 7.8* 8.9* 8.8*  --  8.5*  HCT 23.5* 26.7* 27.0*  --  25.4*  PLT 215  --  221  --  232  LABPROT 28.9*  --  23.2*  --  17.7*  INR 2.8*  --  2.1*  --  1.5*  HEPARINUNFRC  --   --   --  0.25* 0.38  CREATININE 0.83  --  0.82  --   --     Assessment/Plan:  86yo male therapeutic on heparin after rate change. Will continue infusion at current rate of 1000 units/hr and confirm stable with additional level.   Wynona Neat, PharmD, BCPS  12/14/2021,6:40 AM

## 2021-12-14 NOTE — Progress Notes (Signed)
PROGRESS NOTE        PATIENT DETAILS Name: Terry [name] Age: 86 y.o. Sex: male Date of Birth: [birth year] Admit Date: 12/07/2021 Admitting Physician Orene Desanctis, DO SWN:IOEVOJ, [name], MD  Brief Summary: Patient is a 86 y.o.  male with history of mitral valve replacement with a mechanical valve in 2003, HLD, HTN, BPH-who works as a Psychologist, occupational here at [facility] to the hospital following a mechanical fall while walking in the parking lot (tripped on a speed bump)-unfortunately while in the ED-he sustained a syncopal episode and subsequently admitted to the [facility] hospitalist service.  See below for further details.   Significant events: 7/28>> fall at [facility] tears on bilateral arms/face-s/p suturing/combat gauze/pressure-subsequently when attempting to ambulate-he syncopized.  Admit to [facility]. 7/29>> orthostatic vital signs positive  Significant studies: 7/28>> x-ray left forearm: No fracture/dislocation.  Soft tissue swelling over the dorsum. 7/28>> x-ray right hand: No fracture. 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 7/28>> CT maxillofacial: Left periorbital soft tissue swelling without acute fracture.  7/29 >>  MRI - 1. No acute intracranial abnormality. 2. Small left periorbital/facial contusion. 3. Chronic encephalomalacia of with hemosiderin staining at the right occipital lobe, consistent with remote hemorrhagic infarct and/or bleed at this location. 4. Generalized age-related cerebral atrophy with chronic small vessel ischemic disease, with a few scattered remote lacunar infarcts involving the pons/midbrain and cerebellum. Overall, changes are progressed as compared to 2017. 5. Multiple scattered chronic micro hemorrhages involving both cerebral and cerebellar hemispheres. Findings are nonspecific, and could be related to chronic underlying hypertension or possibly cerebral amyloid  angiopathy  7/29 >> TTE -  1. Study is not well visualized. Left ventricular ejection fraction, by estimation, is 60 to 65%. The left ventricle has normal function. The left ventricle has no regional wall motion abnormalities. Left ventricular diastolic parameters are consistent with Grade I diastolic dysfunction (impaired relaxation).  2. Right ventricular systolic function is normal. The right ventricular size is not well visualized. Tricuspid regurgitation signal is inadequate for assessing PA pressure.  3. S/p mechanical MVR 2003. MV mean gradient 4 mmHg at HR 92 bpm. No significant stenosis; normal prosthesis. No evidence of mitral valve regurgitation.  4. The aortic valve was not well visualized. Aortic valve regurgitation is not visualized. No aortic stenosis is present.  5. The inferior vena cava is normal in size with greater than 50% respiratory variability, suggesting right atrial pressure of 3 mmHg  Significant microbiology data: None  Procedures: None  Consults:  None  Subjective: Patient in bed, appears comfortable, denies any headache, no fever, no chest pain or pressure, no shortness of breath , no abdominal pain. No new focal weakness.   Objective: Vitals: Blood pressure 114/63, pulse 99, temperature 98 F (36.7 C), temperature source Oral, resp. rate 18, height '5\' 11"'$  (1.803 m), weight 74.8 kg, SpO2 97 %.   Exam:  Alert awake-not in any distress.  Left periorbital ecchymosis present.  Numerous bandages present-did not open. [place].AT,PERRAL Supple Neck, No JVD,   Symmetrical Chest wall movement, Good air movement bilaterally, CTAB RRR,No Gallops, Rubs or new Murmurs,  +ve B.Sounds, Abd Soft, No tenderness,     Assessment/Plan:  Syncope: Likely orthostatic hypotension-sustained a fall at home and incurred severe left periorbital bruise and laceration, multiple wounds in upper extremities requiring bandages, lives  by himself, also on Coumadin with high INR.  Overall weak  and frail do not think he is safe for discharge by himself to home at this point, continue PT OT, advance activity, echo, MRI nonacute.  Orthostatics have improved, stable on telemetry, looking for SNF bed.  Mechanical fall with soft tissue injury involving left pelvic periorbital area/bilateral upper extremities with bleeding and acute blood loss anemia: Supportive care-mobilize with PT/OT.  Per patient he is updated with his tetanus vaccine.  Note-he required suture to his left forearm small arterial bleeding.  He is still bleeding intermittently during dressing changes and H&H is now gradually falling, will stop his Coumadin on 12/11/2021 currently on heparin drip.  Transfused 1 unit of packed RBC on 12/12/2021 and continue to monitor H&H along with INR/PTT.  Once his H&H remained stable for a few days will commence Coumadin again with caution.  Quite tricky situation with multiple soft tissue injuries and bleeding sites with underlying mechanical mitral valve  Mechanical mitral valve replacement: On Coumadin, Goal INR 2.5-3.5, plz see anticoagulation discussion above.  Low-density changes seen on occipital lobe on CT imaging: MRI brain nonacute.  HTN: Blood pressure is improved low-dose beta-blocker added.  HLD: On statin  BMI: Estimated body mass index is 23.01 kg/m as calculated from the following:   Height as of this encounter: '5\' 11"'$  (1.803 m).   Weight as of this encounter: 74.8 kg.   Code status:   Code Status: DNR   DVT Prophylaxis:Place TED hose Start: 12/09/21 0849 Place TED hose Start: 12/08/21 1008    Family Communication: None at bedside   Disposition Plan: Status is: Observation The patient will require care spanning > 2 midnights and should be moved to inpatient because: Ongoing orthostatic hypotension-continuing gentle hydration for a few more hours-holding all antihypertensives-not yet stable for discharge.   Planned Discharge Destination:Home likely on  7/30   Diet: Diet Order             Diet regular Room service appropriate? Yes; Fluid consistency: Thin  Diet effective now                   MEDICATIONS: Scheduled Meds:  metoprolol tartrate  25 mg Oral BID   rosuvastatin  20 mg Oral Daily   Continuous Infusions:  heparin Stopped (12/14/21 1008)    PRN Meds:.   I have personally reviewed following labs and imaging studies  LABORATORY DATA:  Recent Labs  Lab 12/07/21 2248 12/08/21 0328 12/10/21 0327 12/11/21 0229 12/12/21 1017 12/12/21 5102 12/13/21 0603 12/14/21 0609  WBC 12.6*   < > 9.5 9.4 8.5  --  8.7 9.1  HGB 12.7*   < > 9.5* 8.5* 7.8* 8.9* 8.8* 8.5*  HCT 39.2   < > 29.1* 26.6* 23.5* 26.7* 27.0* 25.4*  PLT 182   < > 162 193 215  --  221 232  MCV 98.5   < > 96.7 97.4 97.1  --  97.8 95.8  MCH 31.9   < > 31.6 31.1 32.2  --  31.9 32.1  MCHC 32.4   < > 32.6 32.0 33.2  --  32.6 33.5  RDW 12.9   < > 13.0 13.1 13.2  --  13.7 14.0  LYMPHSABS 1.5  --   --   --   --   --   --   --   MONOABS 1.4*  --   --   --   --   --   --   --  EOSABS 0.0  --   --   --   --   --   --   --   BASOSABS 0.0  --   --   --   --   --   --   --    < > = values in this interval not displayed.    Recent Labs  Lab 12/10/21 0327 12/11/21 0229 12/12/21 0647 12/13/21 0603 12/14/21 0609  NA 138 138 137 138 138  K 3.9 4.0 3.7 3.9 3.7  CL 105 104 105 106 107  CO2 '26 28 26 26 26  '$ GLUCOSE 98 110* 94 87 100*  BUN '22 23 23 '$ 24* 19  CREATININE 0.82 0.84 0.83 0.82 0.79  CALCIUM 8.2* 8.3* 8.1* 8.2* 8.2*  MG 1.9 2.1 1.9 2.1  --   INR 3.2* 3.0* 2.8* 2.1* 1.5*  BNP 48.0 64.8 115.5* 165.0*  --     RADIOLOGY STUDIES/RESULTS: No results found.   LOS: 5 days   Signature  Lala Lund M.D on 12/14/2021 at 11:18 AM   -  To page go to [email or web address]

## 2021-12-14 NOTE — Progress Notes (Signed)
ANTICOAGULATION CONSULT NOTE - Follow Up Consult  Pharmacy Consult for heparin Indication:  MVR  Labs: Recent Labs    12/12/21 0647 12/12/21 2058 12/13/21 0603 12/13/21 1548 12/14/21 0609 12/14/21 1310  HGB 7.8* 8.9* 8.8*  --  8.5*  --   HCT 23.5* 26.7* 27.0*  --  25.4*  --   PLT 215  --  221  --  232  --   LABPROT 28.9*  --  23.2*  --  17.7*  --   INR 2.8*  --  2.1*  --  1.5*  --   HEPARINUNFRC  --   --   --  0.25* 0.38 0.21*  CREATININE 0.83  --  0.82  --  0.79  --      Assessment: 86yo male therapeutic on heparin after rate change. Continued heparin  infusion at current rate of 1000 units/hr and confirm stable with additional level.   The 8 hr confirmatory heparin level @ 13:00 = 0.21,  decreased to sub-therapeutic however RN reports Heparin drip was paused from 10:00 to 12:00 so wound care could be done.   Heparin resumed at 12:23.   No bleeding reported.   INR is 1.5, trending down  while warfarin on hold.    Plan:  Continue Heparin IV at 1000 units/hr Check 8 hour HL at 20:00 Daily HL, CBC and INR.   Thank you for allowing pharmacy to be part of this patients care team. Nicole Cella, [place] Pharmacist [phone] 12/14/2021,2:40 PM  Please check AMION for all [place] phone numbers After 10:00 PM, call [place]

## 2021-12-14 NOTE — Progress Notes (Signed)
ANTICOAGULATION CONSULT NOTE - Follow Up Consult  Pharmacy Consult for heparin Indication:  MVR  Labs: Recent Labs    12/12/21 0647 12/12/21 2058 12/13/21 0603 12/13/21 1548 12/14/21 0609 12/14/21 1310 12/14/21 2134  HGB 7.8* 8.9* 8.8*  --  8.5*  --   --   HCT 23.5* 26.7* 27.0*  --  25.4*  --   --   PLT 215  --  221  --  232  --   --   LABPROT 28.9*  --  23.2*  --  17.7*  --   --   INR 2.8*  --  2.1*  --  1.5*  --   --   HEPARINUNFRC  --   --   --    < > 0.38 0.21* 0.39  CREATININE 0.83  --  0.82  --  0.79  --   --    < > = values in this interval not displayed.     Assessment: 86yo male therapeutic on heparin after rate change. Continued heparin  infusion at current rate of 1000 units/hr and confirm stable with additional level.   Heparin level 0.39.  No bleeding reported.   Plan:  Continue Heparin IV at 1000 units/hr Check 8 hour HL at 20:00 Daily HL, CBC and INR.  Thank you for allowing pharmacy to be part of this patients care team.  Alanda Slim, PharmD, [facility] Clinical Pharmacist Please see AMION for all Pharmacists' Contact Phone Numbers 12/14/2021, 10:20 PM

## 2021-12-14 NOTE — TOC Progression Note (Signed)
Transition of Care [facility]) - Progression Note    Patient Details  Name: Terry [name] MRN: [id number] Date of Birth: February 25, 1936  Transition of Care [facility]) CM/SW [place], [place] Phone Number: 12/14/2021, 11:19 AM  Clinical Narrative:   CSW spoke with MD and [facility] about patient's status, not medically stable for SNF at this time. [facility] continuing to follow for SNF when medically stable.    Expected Discharge Plan: [place] Barriers to Discharge: Continued Medical Work up, Ship broker  Expected Discharge Plan and Services Expected Discharge Plan: [place] Choice: [place] arrangements for the past 2 months: Single Family Home                                       Social Determinants of Health (SDOH) Interventions    Readmission Risk Interventions     No data to display

## 2021-12-15 DIAGNOSIS — R55 Syncope and collapse: Secondary | ICD-10-CM | POA: Diagnosis not present

## 2021-12-15 LAB — CBC
HCT: 25.9 % — ABNORMAL LOW (ref 39.0–52.0)
Hemoglobin: 8.5 g/dL — ABNORMAL LOW (ref 13.0–17.0)
MCH: 32.3 pg (ref 26.0–34.0)
MCHC: 32.8 g/dL (ref 30.0–36.0)
MCV: 98.5 fL (ref 80.0–100.0)
Platelets: 270 10*3/uL (ref 150–400)
RBC: 2.63 MIL/uL — ABNORMAL LOW (ref 4.22–5.81)
RDW: 14.3 % (ref 11.5–15.5)
WBC: 9.4 10*3/uL (ref 4.0–10.5)
nRBC: 0 % (ref 0.0–0.2)

## 2021-12-15 LAB — PROTIME-INR
INR: 1.3 — ABNORMAL HIGH (ref 0.8–1.2)
Prothrombin Time: 15.9 seconds — ABNORMAL HIGH (ref 11.4–15.2)

## 2021-12-15 LAB — HEPARIN LEVEL (UNFRACTIONATED)
Heparin Unfractionated: 0.29 IU/mL — ABNORMAL LOW (ref 0.30–0.70)
Heparin Unfractionated: 0.5 IU/mL (ref 0.30–0.70)
Heparin Unfractionated: 0.43 IU/mL (ref 0.30–0.70)

## 2021-12-15 MED ORDER — FOLIC ACID 1 MG PO TABS
1.0000 mg | ORAL_TABLET | Freq: Every day | ORAL | Status: DC
  Administered 2021-12-15 – 2021-12-24 (×10): 1 mg via ORAL
  Filled 2021-12-15 (×10): qty 1

## 2021-12-15 MED ORDER — WARFARIN - PHARMACIST DOSING INPATIENT: Freq: Every day | Status: DC

## 2021-12-15 MED ORDER — FERROUS SULFATE 325 (65 FE) MG PO TABS
325.0000 mg | ORAL_TABLET | Freq: Two times a day (BID) | ORAL | Status: DC
  Administered 2021-12-15 – 2021-12-24 (×19): 325 mg via ORAL
  Filled 2021-12-15 (×19): qty 1

## 2021-12-15 MED ORDER — WARFARIN SODIUM 5 MG PO TABS
5.0000 mg | ORAL_TABLET | Freq: Once | ORAL | Status: AC
  Administered 2021-12-15: 5 mg via ORAL
  Filled 2021-12-15: qty 1

## 2021-12-15 NOTE — Progress Notes (Addendum)
ANTICOAGULATION CONSULT NOTE - Follow Up Consult  Pharmacy Consult for heparin Indication:  MVR  Labs: Recent Labs    12/13/21 0603 12/13/21 1548 12/14/21 0609 12/14/21 1310 12/14/21 2134 12/15/21 0241 12/15/21 1322  HGB 8.8*  --  8.5*  --   --  8.5*  --   HCT 27.0*  --  25.4*  --   --  25.9*  --   PLT 221  --  232  --   --  270  --   LABPROT 23.2*  --  17.7*  --   --  15.9*  --   INR 2.1*  --  1.5*  --   --  1.3*  --   HEPARINUNFRC  --    < > 0.38   < > 0.39 0.29* 0.50  CREATININE 0.82  --  0.79  --   --   --   --    < > = values in this interval not displayed.    Assessment: 86yo male presenting 7/28 s/p syncope/fall, continuing on heparin for hx mechanical MVR. Warfarin PTA was held on 8/1 with intermittent bleeding from wounds. Now Pharmacy consulted to resume with heparin bridge. INR 3.5 on admission, down to 1.3 on 8/5 AM. 1300 heparin level is therapeutic at 0.5 and no signs or symptoms of bleeding, CBC stable. Since at the top of the therapeutic range, decreasing slightly to maintain therapeutic level.  Warfarin PTA dose - '5mg'$  daily except 2.'5mg'$  on Mon per last anticoagulation outpatient clinic note (but '5mg'$  daily except 2.'5mg'$  on Mon and Fri per med rec)  Goal of Therapy:  Heparin level 0.3-0.5 units/ml INR 2.5-3.5 Monitor platelets by anticoagulation protocol: Yes  Plan:  Decrease heparin infusion at 1050 units/hr - d/c when INR >2.5 Check 2200 heparin level Warfarin '5mg'$  PO x 1 Monitor daily heparin level/CBC/INR, s/sx bleeding   Varney Daily, PharmD PGY2 Pharmacy Resident  Please check AMION for all [facility] pharmacy phone numbers After 10:00 PM call main pharmacy [phone]

## 2021-12-15 NOTE — Progress Notes (Signed)
ANTICOAGULATION CONSULT NOTE - Follow Up Consult  Pharmacy Consult for heparin Indication:  MVR  Labs: Recent Labs    12/13/21 0603 12/13/21 1548 12/14/21 0609 12/14/21 1310 12/15/21 0241 12/15/21 1322 12/15/21 2215  HGB 8.8*  --  8.5*  --  8.5*  --   --   HCT 27.0*  --  25.4*  --  25.9*  --   --   PLT 221  --  232  --  270  --   --   LABPROT 23.2*  --  17.7*  --  15.9*  --   --   INR 2.1*  --  1.5*  --  1.3*  --   --   HEPARINUNFRC  --    < > 0.38   < > 0.29* 0.50 0.43  CREATININE 0.82  --  0.79  --   --   --   --    < > = values in this interval not displayed.     Assessment: 86yo male presenting 7/28 s/p syncope/fall, continuing on heparin for hx mechanical MVR. Warfarin PTA was held on 8/1 with intermittent bleeding from wounds. Now Pharmacy consulted to resume with heparin bridge. INR 3.5 on admission, down to 1.3 today.  Warfarin PTA dose - '5mg'$  daily except 2.'5mg'$  on Mon per last anticoagulation outpatient clinic note (but '5mg'$  daily except 2.'5mg'$  on Mon and Fri per med rec)  Heparin level 0.43 therapeutic. Warfarin 5 mg x1 given.   Goal of Therapy:  Heparin level 0.3-0.5 units/ml INR 2.5-3.5 Monitor platelets by anticoagulation protocol: Yes  Plan:  Continue heparin infusion at 1050 units/hr - d/c when INR >2.5 Daily heparin level Monitor daily heparin level/CBC/INR, s/sx bleeding   Francena Hanly, PharmD Pharmacy Resident  12/15/2021 10:50 PM

## 2021-12-15 NOTE — Progress Notes (Signed)
PROGRESS NOTE        PATIENT DETAILS Name: Terry [name] Age: 86 y.o. Sex: male Date of Birth: 03-Apr-1936 Admit Date: 12/07/2021 Admitting Physician Orene Desanctis, DO [id number], [name], MD  Brief Summary: Patient is a 86 y.o.  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  See below for further details.   Significant events: 7/28>> fall at [facility] tears on bilateral arms/face-s/p suturing/combat gauze/pressure-subsequently when attempting to ambulate-he syncopized.  Admit to [facility]. 7/29>> orthostatic vital signs positive  Significant studies: 7/28>> x-ray left forearm: No fracture/dislocation.  Soft tissue swelling over the dorsum. 7/28>> x-ray right hand: No fracture. 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 7/28>> CT maxillofacial: Left periorbital soft tissue swelling without acute fracture.  7/29 >>  MRI - 1. No acute intracranial abnormality. 2. Small left periorbital/facial contusion. 3. Chronic encephalomalacia of with hemosiderin staining at the right occipital lobe, consistent with remote hemorrhagic infarct and/or bleed at this location. 4. Generalized age-related cerebral atrophy with chronic small vessel ischemic disease, with a few scattered remote lacunar infarcts involving the pons/midbrain and cerebellum. Overall, changes are progressed as compared to 2017. 5. Multiple scattered chronic micro hemorrhages involving both cerebral and cerebellar hemispheres. Findings are nonspecific, and could be related to chronic underlying hypertension or possibly cerebral amyloid  angiopathy  7/29 >> TTE -  1. Study is not well visualized. Left ventricular ejection fraction, by estimation, is 60 to 65%. The left ventricle has normal function. The left ventricle has no regional wall motion abnormalities. Left ventricular diastolic parameters are consistent with Grade I diastolic dysfunction (impaired relaxation).  2. Right ventricular systolic function is normal. The right ventricular size is not well visualized. Tricuspid regurgitation signal is inadequate for assessing PA pressure.  3. S/p mechanical MVR 2003. MV mean gradient 4 mmHg at HR 92 bpm. No significant stenosis; normal prosthesis. No evidence of mitral valve regurgitation.  4. The aortic valve was not well visualized. Aortic valve regurgitation is not visualized. No aortic stenosis is present.  5. The inferior vena cava is normal in size with greater than 50% respiratory variability, suggesting right atrial pressure of 3 mmHg  Significant microbiology data: None  Procedures: None  Consults:  None  Subjective: Patient in bed, appears comfortable, denies any headache, no fever, no chest pain or pressure, no shortness of breath , no abdominal pain. No new focal weakness.   Objective: Vitals: Blood pressure (!) 101/55, pulse (!) 110, temperature 98.3 F (36.8 C), temperature source Oral, resp. rate 19, height '5\' 11"'$  (1.803 m), weight 74.8 kg, SpO2 100 %.   Exam:  Alert awake-not in any distress.  Left periorbital ecchymosis present.  Numerous bandages present-did not open. [place].AT,PERRAL Supple Neck, No JVD,   Symmetrical Chest wall movement, Good air movement bilaterally, CTAB RRR,No Gallops, Rubs or new Murmurs,  +ve B.Sounds, Abd Soft, No tenderness,    Assessment/Plan:  Syncope: Likely orthostatic hypotension-sustained a fall at home and incurred severe left periorbital bruise and laceration, multiple wounds in upper extremities requiring bandages,  lives by himself, also on Coumadin with high INR.   Overall weak and frail do not think he is safe for discharge by himself to home at this point, continue PT OT, advance activity, echo, MRI nonacute.  Orthostatics have improved, stable on telemetry, looking for SNF bed.  Mechanical fall with soft tissue injury involving left pelvic periorbital area/bilateral upper extremities with bleeding and acute blood loss anemia: Supportive care-mobilize with PT/OT.  Per patient he is updated with his tetanus vaccine.  Note-he required suture to his left forearm small arterial bleeding.  He is still bleeding intermittently during dressing changes and H&H is now gradually falling, will stop his Coumadin on 12/11/2021 currently on heparin drip.  Transfused 1 unit of packed RBC on 12/12/2021 and continue to monitor H&H along with INR/PTT.  H&H remained stable over 48 hours, dressing change on 12/14/2021 did not reveal any fresh bleeding, commands Coumadin with caution on 12/15/2021.  Quite tricky situation with multiple soft tissue injuries and bleeding sites with underlying mechanical mitral valve  Mechanical mitral valve replacement: On Coumadin, Goal INR 2.5-3.5, plz see anticoagulation discussion above.  Low-density changes seen on occipital lobe on CT imaging: MRI brain nonacute.  HTN: Blood pressure is improved low-dose beta-blocker added.  HLD: On statin  BMI: Estimated body mass index is 23.01 kg/m as calculated from the following:   Height as of this encounter: '5\' 11"'$  (1.803 m).   Weight as of this encounter: 74.8 kg.   Code status:   Code Status: DNR   DVT Prophylaxis:Place TED hose Start: 12/09/21 0849 Place TED hose Start: 12/08/21 1008 warfarin (COUMADIN) tablet 5 mg    Family Communication: None at bedside   Disposition Plan: SNF once H&H and INR stabilized   Diet: Diet Order             Diet regular Room service appropriate? Yes; Fluid consistency: Thin  Diet effective now                   MEDICATIONS: Scheduled Meds:  ferrous  sulfate  325 mg Oral BID WC   folic acid  1 mg Oral Daily   metoprolol tartrate  25 mg Oral BID   rosuvastatin  20 mg Oral Daily   warfarin  5 mg Oral ONCE-1600   Warfarin - Pharmacist Dosing Inpatient   Does not apply q1600   Continuous Infusions:  heparin 1,100 Units/hr (12/15/21 0451)    PRN Meds:.   I have personally reviewed following labs and imaging studies  LABORATORY DATA:  Recent Labs  Lab 12/11/21 0229 12/12/21 0647 12/12/21 2058 12/13/21 0603 12/14/21 0609 12/15/21 0241  WBC 9.4 8.5  --  8.7 9.1 9.4  HGB 8.5* 7.8* 8.9* 8.8* 8.5* 8.5*  HCT 26.6* 23.5* 26.7* 27.0* 25.4* 25.9*  PLT 193 215  --  221 232 270  MCV 97.4 97.1  --  97.8 95.8 98.5  MCH 31.1 32.2  --  31.9 32.1 32.3  MCHC 32.0 33.2  --  32.6 33.5 32.8  RDW 13.1 13.2  --  13.7 14.0 14.3    Recent Labs  Lab 12/10/21 0327 12/11/21 0229 12/12/21 0647 12/13/21 0603 12/14/21 0609 12/15/21 0241  NA 138 138 137 138 138  --   K 3.9 4.0 3.7 3.9 3.7  --   CL 105 104 105 106 107  --   CO2 '26 28 26 26 26  '$ --   GLUCOSE 98 110* 94 87 100*  --   BUN  $'22 23 23 'm$ 24* 19  --   CREATININE 0.82 0.84 0.83 0.82 0.79  --   CALCIUM 8.2* 8.3* 8.1* 8.2* 8.2*  --   MG 1.9 2.1 1.9 2.1  --   --   INR 3.2* 3.0* 2.8* 2.1* 1.5* 1.3*  BNP 48.0 64.8 115.5* 165.0*  --   --     RADIOLOGY STUDIES/RESULTS: No results found.   LOS: 6 days   Signature  Lala Lund M.D on 12/15/2021 at 9:58 AM   -  To page go to [email or web address]

## 2021-12-15 NOTE — Progress Notes (Signed)
ANTICOAGULATION CONSULT NOTE - Follow Up Consult  Pharmacy Consult for heparin Indication:  MVR  Labs: Recent Labs    12/12/21 0647 12/12/21 2058 12/13/21 0603 12/13/21 1548 12/14/21 0609 12/14/21 1310 12/14/21 2134 12/15/21 0241  HGB 7.8*   < > 8.8*  --  8.5*  --   --  8.5*  HCT 23.5*   < > 27.0*  --  25.4*  --   --  25.9*  PLT 215  --  221  --  232  --   --  270  LABPROT 28.9*  --  23.2*  --  17.7*  --   --  15.9*  INR 2.8*  --  2.1*  --  1.5*  --   --  1.3*  HEPARINUNFRC  --   --   --    < > 0.38 0.21* 0.39 0.29*  CREATININE 0.83  --  0.82  --  0.79  --   --   --    < > = values in this interval not displayed.     Assessment: 86yo male presenting 7/28 s/p syncope/fall, continuing on heparin for hx mechanical MVR. Warfarin PTA was held on 8/1 with intermittent bleeding from wounds. Now Pharmacy consulted to resume with heparin bridge. INR 3.5 on admission, down to 1.3 today.  Warfarin PTA dose - '5mg'$  daily except 2.'5mg'$  on Mon per last anticoagulation outpatient clinic note (but '5mg'$  daily except 2.'5mg'$  on Mon and Fri per med rec)  Goal of Therapy:  Heparin level 0.3-0.5 units/ml INR 2.5-3.5 Monitor platelets by anticoagulation protocol: Yes  Plan:  Continue heparin infusion at 1100 units/hr - d/c when INR >2.5 Check 1300 heparin level Warfarin '5mg'$  PO x 1  Monitor daily heparin level/CBC/INR, s/sx bleeding   Arturo Morton, PharmD, BCPS Please check AMION for all [place] contact numbers Clinical Pharmacist 12/15/2021 5:48 AM

## 2021-12-15 NOTE — Progress Notes (Signed)
ANTICOAGULATION CONSULT NOTE - Follow Up Consult  Pharmacy Consult for heparin Indication:  MVR  Labs: Recent Labs    12/12/21 0647 12/12/21 2058 12/13/21 0603 12/13/21 1548 12/14/21 0609 12/14/21 1310 12/14/21 2134 12/15/21 0241  HGB 7.8*   < > 8.8*  --  8.5*  --   --  8.5*  HCT 23.5*   < > 27.0*  --  25.4*  --   --  25.9*  PLT 215  --  221  --  232  --   --  270  LABPROT 28.9*  --  23.2*  --  17.7*  --   --  15.9*  INR 2.8*  --  2.1*  --  1.5*  --   --  1.3*  HEPARINUNFRC  --   --   --    < > 0.38 0.21* 0.39 0.29*  CREATININE 0.83  --  0.82  --  0.79  --   --   --    < > = values in this interval not displayed.    Assessment: 86yo male subtherapeutic on heparin after one level at goal; no infusion issues or signs of bleeding per RN.  Goal of Therapy:  Heparin level 0.3-0.5 units/ml   Plan:  Will increase heparin infusion by ~1 units/kg/hr to 1100 units/hr and check level in 8 hours.    Wynona Neat, PharmD, BCPS  12/15/2021,4:50 AM

## 2021-12-16 DIAGNOSIS — R55 Syncope and collapse: Secondary | ICD-10-CM | POA: Diagnosis not present

## 2021-12-16 LAB — HEPARIN LEVEL (UNFRACTIONATED): Heparin Unfractionated: 0.37 IU/mL (ref 0.30–0.70)

## 2021-12-16 LAB — CBC
HCT: 27.2 % — ABNORMAL LOW (ref 39.0–52.0)
Hemoglobin: 8.7 g/dL — ABNORMAL LOW (ref 13.0–17.0)
MCH: 32 pg (ref 26.0–34.0)
MCHC: 32 g/dL (ref 30.0–36.0)
MCV: 100 fL (ref 80.0–100.0)
Platelets: 290 10*3/uL (ref 150–400)
RBC: 2.72 MIL/uL — ABNORMAL LOW (ref 4.22–5.81)
RDW: 14.6 % (ref 11.5–15.5)
WBC: 9.3 10*3/uL (ref 4.0–10.5)
nRBC: 0 % (ref 0.0–0.2)

## 2021-12-16 LAB — PROTIME-INR
INR: 1.2 (ref 0.8–1.2)
Prothrombin Time: 15.2 seconds (ref 11.4–15.2)

## 2021-12-16 MED ORDER — WARFARIN SODIUM 5 MG PO TABS
5.0000 mg | ORAL_TABLET | Freq: Once | ORAL | Status: AC
  Administered 2021-12-16: 5 mg via ORAL
  Filled 2021-12-16: qty 1

## 2021-12-16 NOTE — Progress Notes (Signed)
ANTICOAGULATION CONSULT NOTE - Follow Up Consult  Pharmacy Consult for heparin Indication:  MVR  Labs: Recent Labs    12/14/21 0609 12/14/21 1310 12/15/21 0241 12/15/21 1322 12/15/21 2215 12/16/21 0440  HGB 8.5*  --  8.5*  --   --  8.7*  HCT 25.4*  --  25.9*  --   --  27.2*  PLT 232  --  270  --   --  290  LABPROT 17.7*  --  15.9*  --   --  15.2  INR 1.5*  --  1.3*  --   --  1.2  HEPARINUNFRC 0.38   < > 0.29* 0.50 0.43 0.37  CREATININE 0.79  --   --   --   --   --    < > = values in this interval not displayed.     Assessment: 86yo male presenting 7/28 s/p syncope/fall, continuing on heparin for hx mechanical MVR. Warfarin PTA was held on 8/1 with intermittent bleeding from wounds. Now Pharmacy consulted to resume with heparin bridge. INR 3.5 on admission. Heparin level is therapeutic at 0.37 for the third instance and no signs or symptoms of bleeding, CBC stable. INR is 1.2 this morning and will continue to follow his home dosing schedule. Can transition to daily heparin levels.  Warfarin PTA dose - '5mg'$  daily except 2.'5mg'$  on Mon per last anticoagulation outpatient clinic note (but '5mg'$  daily except 2.'5mg'$  on Mon and Fri per med rec)  Goal of Therapy:  Heparin level 0.3-0.5 units/ml INR 2.5-3.5 Monitor platelets by anticoagulation protocol: Yes  Plan:  Continue heparin infusion at 1050 units/hr - d/c when INR >2.5 Warfarin '5mg'$  PO x 1 Monitor daily heparin level/CBC/INR, s/sx bleeding   Varney Daily, PharmD PGY2 Pharmacy Resident  Please check AMION for all [facility] pharmacy phone numbers After 10:00 PM call main pharmacy [phone]

## 2021-12-16 NOTE — Progress Notes (Signed)
PROGRESS NOTE        PATIENT DETAILS Name: Terry [name] Age: 86 y.o. Sex: male Date of Birth: Mar 01, 1936 Admit Date: 12/07/2021 Admitting Physician Orene Desanctis, DO [id number], [name], MD  Brief Summary: Patient is a 86 y.o.  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  See below for further details.   Significant events: 7/28>> fall at [facility] tears on bilateral arms/face-s/p suturing/combat gauze/pressure-subsequently when attempting to ambulate-he syncopized.  Admit to [facility]. 7/29>> orthostatic vital signs positive  Significant studies: 7/28>> x-ray left forearm: No fracture/dislocation.  Soft tissue swelling over the dorsum. 7/28>> x-ray right hand: No fracture. 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 7/28>> CT maxillofacial: Left periorbital soft tissue swelling without acute fracture.  7/29 >>  MRI - 1. No acute intracranial abnormality. 2. Small left periorbital/facial contusion. 3. Chronic encephalomalacia of with hemosiderin staining at the right occipital lobe, consistent with remote hemorrhagic infarct and/or bleed at this location. 4. Generalized age-related cerebral atrophy with chronic small vessel ischemic disease, with a few scattered remote lacunar infarcts involving the pons/midbrain and cerebellum. Overall, changes are progressed as compared to 2017. 5. Multiple scattered chronic micro hemorrhages involving both cerebral and cerebellar hemispheres. Findings are nonspecific, and could be related to chronic underlying hypertension or possibly cerebral amyloid  angiopathy  7/29 >> TTE -  1. Study is not well visualized. Left ventricular ejection fraction, by estimation, is 60 to 65%. The left ventricle has normal function. The left ventricle has no regional wall motion abnormalities. Left ventricular diastolic parameters are consistent with Grade I diastolic dysfunction (impaired relaxation).  2. Right ventricular systolic function is normal. The right ventricular size is not well visualized. Tricuspid regurgitation signal is inadequate for assessing PA pressure.  3. S/p mechanical MVR 2003. MV mean gradient 4 mmHg at HR 92 bpm. No significant stenosis; normal prosthesis. No evidence of mitral valve regurgitation.  4. The aortic valve was not well visualized. Aortic valve regurgitation is not visualized. No aortic stenosis is present.  5. The inferior vena cava is normal in size with greater than 50% respiratory variability, suggesting right atrial pressure of 3 mmHg  Significant microbiology data: None  Procedures: None  Consults:  None  Subjective: Patient in bed, appears comfortable, denies any headache, no fever, no chest pain or pressure, no shortness of breath , no abdominal pain. No new focal weakness.    Objective: Vitals: Blood pressure 117/63, pulse 100, temperature 98 F (36.7 C), temperature source Oral, resp. rate 16, height '5\' 11"'$  (1.803 m), weight 74.8 kg, SpO2 97 %.   Exam:  Alert awake-not in any distress.  Left periorbital ecchymosis present.  Numerous bandages present-did not open. [place].AT,PERRAL Supple Neck, No JVD,   Symmetrical Chest wall movement, Good air movement bilaterally, CTAB RRR,No Gallops, Rubs or new Murmurs,  +ve B.Sounds, Abd Soft, No tenderness,    Assessment/Plan:  Syncope: Likely orthostatic hypotension-sustained a fall at home and incurred severe left periorbital bruise and laceration, multiple wounds in upper extremities requiring bandages, lives  by himself, also on Coumadin with high INR.  Overall weak  and frail do not think he is safe for discharge by himself to home at this point, continue PT OT, advance activity, echo, MRI nonacute.  Orthostatics have improved, stable on telemetry, looking for SNF bed discharge once INR reaches 2.5 with stable H&H.  Mechanical fall with soft tissue injury involving left pelvic periorbital area/bilateral upper extremities with bleeding and acute blood loss anemia: Supportive care-mobilize with PT/OT.  Per patient he is updated with his tetanus vaccine.  Note-he required suture to his left forearm small arterial bleeding.  He is still bleeding intermittently during dressing changes and H&H is now gradually falling, will stop his Coumadin on 12/11/2021 currently on heparin drip.  Transfused 1 unit of packed RBC on 12/12/2021 and continue to monitor H&H along with INR/PTT.  H&H remained stable over 48 hours, dressing change on 12/14/2021 did not reveal any fresh bleeding, commands Coumadin with caution on 12/15/2021.  Quite tricky situation with multiple soft tissue injuries and bleeding sites with underlying mechanical mitral valve  Mechanical mitral valve replacement: On Coumadin, Goal INR 2.5-3.5, plz see anticoagulation discussion above.  Low-density changes seen on occipital lobe on CT imaging: MRI brain nonacute.  HTN: Blood pressure is improved low-dose beta-blocker added.  HLD: On statin  BMI: Estimated body mass index is 23.01 kg/m as calculated from the following:   Height as of this encounter: '5\' 11"'$  (1.803 m).   Weight as of this encounter: 74.8 kg.   Code status:   Code Status: DNR   DVT Prophylaxis:Place TED hose Start: 12/09/21 0849 Place TED hose Start: 12/08/21 1008 warfarin (COUMADIN) tablet 5 mg    Family Communication: None at bedside   Disposition Plan: SNF once H&H and INR stabilized   Diet: Diet Order             Diet regular Room service appropriate? Yes; Fluid consistency: Thin  Diet effective now                    MEDICATIONS: Scheduled Meds:  ferrous sulfate  325 mg Oral BID WC   folic acid  1 mg Oral Daily   metoprolol tartrate  25 mg Oral BID   rosuvastatin  20 mg Oral Daily   warfarin  5 mg Oral ONCE-1600   Warfarin - Pharmacist Dosing Inpatient   Does not apply q1600   Continuous Infusions:  heparin 1,050 Units/hr (12/16/21 0000)    PRN Meds:.   I have personally reviewed following labs and imaging studies  LABORATORY DATA:  Recent Labs  Lab 12/12/21 0647 12/12/21 2058 12/13/21 0603 12/14/21 0609 12/15/21 0241 12/16/21 0440  WBC 8.5  --  8.7 9.1 9.4 9.3  HGB 7.8* 8.9* 8.8* 8.5* 8.5* 8.7*  HCT 23.5* 26.7* 27.0* 25.4* 25.9* 27.2*  PLT 215  --  221 232 270 290  MCV 97.1  --  97.8 95.8 98.5 100.0  MCH 32.2  --  31.9 32.1 32.3 32.0  MCHC 33.2  --  32.6 33.5 32.8 32.0  RDW 13.2  --  13.7 14.0 14.3 14.6    Recent Labs  Lab 12/10/21 0327 12/11/21 0229 12/12/21 0647 12/13/21 0603 12/14/21 0609 12/15/21 0241 12/16/21 0440  NA 138 138 137 138 138  --   --   K 3.9 4.0 3.7 3.9 3.7  --   --   CL 105 104 105 106 107  --   --   CO2 26 28  $'26 26 26  'Y$ --   --   GLUCOSE 98 110* 94 87 100*  --   --   BUN '22 23 23 '$ 24* 19  --   --   CREATININE 0.82 0.84 0.83 0.82 0.79  --   --   CALCIUM 8.2* 8.3* 8.1* 8.2* 8.2*  --   --   MG 1.9 2.1 1.9 2.1  --   --   --   INR 3.2* 3.0* 2.8* 2.1* 1.5* 1.3* 1.2  BNP 48.0 64.8 115.5* 165.0*  --   --   --     RADIOLOGY STUDIES/RESULTS: No results found.   LOS: 7 days   Signature  Lala Lund M.D on 12/16/2021 at 8:36 AM   -  To page go to [email or web address]

## 2021-12-17 DIAGNOSIS — R55 Syncope and collapse: Secondary | ICD-10-CM | POA: Diagnosis not present

## 2021-12-17 LAB — CBC
HCT: 26.8 % — ABNORMAL LOW (ref 39.0–52.0)
Hemoglobin: 8.8 g/dL — ABNORMAL LOW (ref 13.0–17.0)
MCH: 32.6 pg (ref 26.0–34.0)
MCHC: 32.8 g/dL (ref 30.0–36.0)
MCV: 99.3 fL (ref 80.0–100.0)
Platelets: 320 10*3/uL (ref 150–400)
RBC: 2.7 MIL/uL — ABNORMAL LOW (ref 4.22–5.81)
RDW: 14.6 % (ref 11.5–15.5)
WBC: 10.3 10*3/uL (ref 4.0–10.5)
nRBC: 0 % (ref 0.0–0.2)

## 2021-12-17 LAB — BASIC METABOLIC PANEL
Anion gap: 7 (ref 5–15)
BUN: 19 mg/dL (ref 8–23)
CO2: 24 mmol/L (ref 22–32)
Calcium: 8.3 mg/dL — ABNORMAL LOW (ref 8.9–10.3)
Chloride: 105 mmol/L (ref 98–111)
Creatinine, Ser: 0.84 mg/dL (ref 0.61–1.24)
GFR, Estimated: >60 mL/min (ref >60)
Glucose, Bld: 93 mg/dL (ref 70–99)
Potassium: 3.7 mmol/L (ref 3.5–5.1)
Sodium: 136 mmol/L (ref 135–145)

## 2021-12-17 LAB — PROTIME-INR
INR: 1.4 — ABNORMAL HIGH (ref 0.8–1.2)
Prothrombin Time: 16.5 seconds — ABNORMAL HIGH (ref 11.4–15.2)

## 2021-12-17 LAB — HEPARIN LEVEL (UNFRACTIONATED): Heparin Unfractionated: 0.33 IU/mL (ref 0.30–0.70)

## 2021-12-17 MED ORDER — BACLOFEN 10 MG PO TABS
5.0000 mg | ORAL_TABLET | Freq: Three times a day (TID) | ORAL | Status: DC | PRN
  Administered 2021-12-17 – 2021-12-24 (×5): 5 mg via ORAL
  Filled 2021-12-17 (×6): qty 1

## 2021-12-17 MED ORDER — WARFARIN SODIUM 5 MG PO TABS
5.0000 mg | ORAL_TABLET | Freq: Once | ORAL | Status: AC
  Administered 2021-12-17: 5 mg via ORAL
  Filled 2021-12-17: qty 1

## 2021-12-17 MED ORDER — LACTATED RINGERS IV BOLUS
500.0000 mL | Freq: Once | INTRAVENOUS | Status: AC
  Administered 2021-12-17: 500 mL via INTRAVENOUS

## 2021-12-17 NOTE — TOC Progression Note (Signed)
Transition of Care [facility]) - Progression Note    Patient Details  Name: Terry [name] MRN: [id number] Date of Birth: 1936-01-05  Transition of Care [facility]) CM/SW [place], [place] Phone Number: 12/17/2021, 2:23 PM  Clinical Narrative:   CSW following for SNF placement when medically stable. Patient continues to await medical stability prior to transition to SNF. CSW updated [facility], they are continuing to follow.     Expected Discharge Plan: [place] Barriers to Discharge: Continued Medical Work up, Ship broker  Expected Discharge Plan and Services Expected Discharge Plan: [place] Choice: [place] arrangements for the past 2 months: Single Family Home                                       Social Determinants of Health (SDOH) Interventions    Readmission Risk Interventions     No data to display

## 2021-12-17 NOTE — Progress Notes (Signed)
PROGRESS NOTE        PATIENT DETAILS Name: Terry [name] Age: 86 y.o. Sex: male Date of Birth: 1935/09/04 Admit Date: 12/07/2021 Admitting Physician Orene Desanctis, DO DVV:OHYWVP, [name], MD  Brief Summary: Patient is a 86 y.o.  male with history of mitral valve replacement with a mechanical valve in 2003, HLD, HTN, BPH-who works as a Psychologist, occupational here at [facility] to the hospital following a mechanical fall while walking in the parking lot (tripped on a speed bump)-unfortunately while in the ED-he sustained a syncopal episode and subsequently admitted to the [facility].  See below for further details.   Significant events: 7/28>> fall at [facility] tears on bilateral arms/face-s/p suturing/combat gauze/pressure-subsequently when attempting to ambulate-he syncopized.  Admit to [facility]. 7/29>> orthostatic vital signs positive  Significant studies: 7/28>> x-ray left forearm: No fracture/dislocation.  Soft tissue swelling over the dorsum. 7/28>> x-ray right hand: No fracture. 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 7/28>> CT maxillofacial: Left periorbital soft tissue swelling without acute fracture.  7/29 >>  MRI - 1. No acute intracranial abnormality. 2. Small left periorbital/facial contusion. 3. Chronic encephalomalacia of with hemosiderin staining at the right occipital lobe, consistent with remote hemorrhagic infarct and/or bleed at this location. 4. Generalized age-related cerebral atrophy with chronic small vessel ischemic disease, with a few scattered remote lacunar infarcts involving the pons/midbrain and cerebellum. Overall, changes are progressed as compared to 2017. 5. Multiple scattered chronic micro hemorrhages involving both cerebral and cerebellar hemispheres. Findings are nonspecific, and could be related to chronic underlying hypertension or possibly cerebral amyloid  angiopathy  7/29 >> TTE -  1. Study is not well visualized. Left ventricular ejection fraction, by estimation, is 60 to 65%. The left ventricle has normal function. The left ventricle has no regional wall motion abnormalities. Left ventricular diastolic parameters are consistent with Grade I diastolic dysfunction (impaired relaxation).  2. Right ventricular systolic function is normal. The right ventricular size is not well visualized. Tricuspid regurgitation signal is inadequate for assessing PA pressure.  3. S/p mechanical MVR 2003. MV mean gradient 4 mmHg at HR 92 bpm. No significant stenosis; normal prosthesis. No evidence of mitral valve regurgitation.  4. The aortic valve was not well visualized. Aortic valve regurgitation is not visualized. No aortic stenosis is present.  5. The inferior vena cava is normal in size with greater than 50% respiratory variability, suggesting right atrial pressure of 3 mmHg  Significant microbiology data: None  Procedures: None  Consults:  None  Subjective: Patient in bed, appears comfortable, denies any headache, no fever, no chest pain or pressure, no shortness of breath , no abdominal pain. No new focal weakness.   Objective: Vitals: Blood pressure 98/66, pulse (!) 57, temperature 97.7 F (36.5 C), temperature source Oral, resp. rate 18, height '5\' 11"'$  (1.803 m), weight 74.8 kg, SpO2 98 %.   Exam:  Alert awake-not in any distress.  Left periorbital ecchymosis present.  Numerous bandages present-did not open. Anthon.AT,PERRAL Supple Neck, No JVD,   Symmetrical Chest wall movement, Good air movement bilaterally, CTAB RRR,No Gallops, Rubs or new Murmurs,  +ve B.Sounds, Abd Soft, No tenderness,   No Cyanosis, Clubbing or edema    Assessment/Plan:  Syncope: Likely orthostatic hypotension-sustained a fall at home and incurred severe left periorbital bruise and laceration, multiple  wounds in upper extremities requiring bandages, lives by himself, also on  Coumadin with high INR.  Overall weak and frail do not think he is safe for discharge by himself to home at this point, continue PT OT, advance activity, echo, MRI nonacute.  Orthostatics have improved, stable on telemetry, looking for SNF bed discharge once INR reaches 2.5 with stable H&H.  Continue heparin and Coumadin overlap.  Mechanical fall with soft tissue injury involving left pelvic periorbital area/bilateral upper extremities with bleeding and acute blood loss anemia: Supportive care-mobilize with PT/OT.  Per patient he is updated with his tetanus vaccine.  Note-he required suture to his left forearm small arterial bleeding.  He is still bleeding intermittently during dressing changes and H&H is now gradually falling, will stop his Coumadin on 12/11/2021 currently on heparin drip.  Transfused 1 unit of packed RBC on 12/12/2021 and continue to monitor H&H along with INR/PTT.  H&H remained stable over 48 hours, dressing change on 12/14/2021 did not reveal any fresh bleeding, commands Coumadin with caution on 12/15/2021.  Quite tricky situation with multiple soft tissue injuries and bleeding sites with underlying mechanical mitral valve  Mechanical mitral valve replacement: On Coumadin, Goal INR 2.5-3.5, plz see anticoagulation discussion above.Continue heparin and Coumadin overlap.  Low-density changes seen on occipital lobe on CT imaging: MRI brain nonacute.  HTN: Blood pressure is improved low-dose beta-blocker added.  HLD: On statin  BMI: Estimated body mass index is 23.01 kg/m as calculated from the following:   Height as of this encounter: '5\' 11"'$  (1.803 m).   Weight as of this encounter: 74.8 kg.   Code status:   Code Status: DNR   DVT Prophylaxis:Place TED hose Start: 12/09/21 0849 Place TED hose Start: 12/08/21 1008 warfarin (COUMADIN) tablet 5 mg    Family Communication: None at bedside   Disposition Plan: SNF once H&H and INR stabilized   Diet: Diet Order             Diet  regular Room service appropriate? Yes; Fluid consistency: Thin  Diet effective now                   MEDICATIONS: Scheduled Meds:  ferrous sulfate  325 mg Oral BID WC   folic acid  1 mg Oral Daily   metoprolol tartrate  25 mg Oral BID   rosuvastatin  20 mg Oral Daily   warfarin  5 mg Oral ONCE-1600   Warfarin - Pharmacist Dosing Inpatient   Does not apply q1600   Continuous Infusions:  heparin 1,050 Units/hr (12/16/21 1237)   lactated ringers      PRN Meds:.   I have personally reviewed following labs and imaging studies  LABORATORY DATA:  Recent Labs  Lab 12/13/21 0603 12/14/21 0609 12/15/21 0241 12/16/21 0440 12/17/21 0556  WBC 8.7 9.1 9.4 9.3 10.3  HGB 8.8* 8.5* 8.5* 8.7* 8.8*  HCT 27.0* 25.4* 25.9* 27.2* 26.8*  PLT 221 232 270 290 320  MCV 97.8 95.8 98.5 100.0 99.3  MCH 31.9 32.1 32.3 32.0 32.6  MCHC 32.6 33.5 32.8 32.0 32.8  RDW 13.7 14.0 14.3 14.6 14.6    Recent Labs  Lab 12/11/21 0229 12/12/21 0647 12/13/21 0603 12/14/21 0609 12/15/21 0241 12/16/21 0440 12/17/21 0556  NA 138 137 138 138  --   --  136  K 4.0 3.7 3.9 3.7  --   --  3.7  CL 104 105 106 107  --   --  105  CO2 28  $'26 26 26  'E$ --   --  24  GLUCOSE 110* 94 87 100*  --   --  93  BUN 23 23 24* 19  --   --  19  CREATININE 0.84 0.83 0.82 0.79  --   --  0.84  CALCIUM 8.3* 8.1* 8.2* 8.2*  --   --  8.3*  MG 2.1 1.9 2.1  --   --   --   --   INR 3.0* 2.8* 2.1* 1.5* 1.3* 1.2 1.4*  BNP 64.8 115.5* 165.0*  --   --   --   --     RADIOLOGY STUDIES/RESULTS: No results found.   LOS: 8 days   Signature  Lala Lund M.D on 12/17/2021 at 9:07 AM   -  To page go to [email or web address]

## 2021-12-17 NOTE — Progress Notes (Signed)
ANTICOAGULATION CONSULT NOTE - Follow Up Consult  Pharmacy Consult for heparin Indication:  MVR  Labs: Recent Labs    12/15/21 0241 12/15/21 1322 12/15/21 2215 12/16/21 0440 12/17/21 0556  HGB 8.5*  --   --  8.7* 8.8*  HCT 25.9*  --   --  27.2* 26.8*  PLT 270  --   --  290 320  LABPROT 15.9*  --   --  15.2 16.5*  INR 1.3*  --   --  1.2 1.4*  HEPARINUNFRC 0.29*   < > 0.43 0.37 0.33  CREATININE  --   --   --   --  0.84   < > = values in this interval not displayed.     Assessment: 86yo male presenting 7/28 s/p syncope/fall, continuing on heparin for hx mechanical MVR. Warfarin PTA was held on 8/1 with intermittent bleeding from wounds. Now Pharmacy consulted to resume with heparin bridge. INR 3.5 on admission.   Heparin level is therapeutic at 0.33 on 1050 units/hr. INR is subtherapeutic at 1.4. No bleeding noted, Hgb stable in 8s, platelets are normal.  Warfarin PTA dose - '5mg'$  daily except 2.'5mg'$  on Mon per last anticoagulation outpatient clinic note (but '5mg'$  daily except 2.'5mg'$  on Mon and Fri per med rec)  Goal of Therapy:  Heparin level 0.3-0.5 units/ml INR 2.5-3.5 Monitor platelets by anticoagulation protocol: Yes  Plan:  Continue heparin infusion at 1050 units/hr - d/c when INR >2.5 Warfarin '5mg'$  PO x 1 Monitor daily heparin level/CBC/INR, s/sx bleeding  Thank you for involving pharmacy in this patient's care.  Renold Genta, PharmD, BCPS Clinical Pharmacist Clinical phone for 12/17/2021 until 3p is [phone] 12/17/2021 7:38 AM

## 2021-12-17 NOTE — Progress Notes (Signed)
Physical Therapy Treatment Patient Details Name: Terry [name] MRN: [id number] DOB: [birth year] Today's Date: 12/17/2021   History of Present Illness 86 y/o male presented to ED on 12/07/21 after fall in parking lot. Had a syncopal event in ED. 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 PMH: mitral valve replacement, HTN    PT Comments    Pt admitted with above diagnosis. Pt was able to incr distance and needs min guard assist for activity.  Decr endurance but improving each day.  Pt currently with functional limitations due to balance and endurance deficits. Pt will benefit from skilled PT to increase their independence and safety with mobility to allow discharge to the venue listed below.      Recommendations for follow up therapy are one component of a multi-disciplinary discharge planning process, led by the attending physician.  Recommendations may be updated based on patient status, additional functional criteria and insurance authorization.  Follow Up Recommendations  Skilled nursing-short term rehab (<3 hours/day) Can patient physically be transported by private vehicle: Yes   Assistance Recommended at Discharge Intermittent Supervision/Assistance  Patient can return home with the following A little help with walking and/or transfers;A little help with bathing/dressing/bathroom;Assistance with cooking/housework;Assist for transportation;Help with stairs or ramp for entrance   Equipment Recommendations  Rolling walker (2 wheels)    Recommendations for Other Services       Precautions / Restrictions Precautions Precautions: Fall Restrictions Weight Bearing Restrictions: No     Mobility  Bed Mobility               General bed mobility comments: up in chair    Transfers Overall transfer level: Needs assistance Equipment used: Rolling walker (2 wheels) Transfers: Sit to/from Stand Sit to Stand: Min guard           General transfer comment:  increased time to stand due to L hip pain    Ambulation/Gait Ambulation/Gait assistance: Min guard Gait Distance (Feet): 240 Feet Assistive device: Rolling walker (2 wheels) Gait Pattern/deviations: Step-through pattern, Decreased stride length, Antalgic, Trunk flexed Gait velocity: decreased Gait velocity interpretation: <1.31 ft/sec, indicative of household ambulator   General Gait Details: increased time and pain with turning in hallway; minguard for balance as he did have one episode of imbalance with left hip pain when walk almost complete.   Stairs             Wheelchair Mobility    Modified Rankin (Stroke Patients Only)       Balance Overall balance assessment: Needs assistance Sitting-balance support: Feet supported, No upper extremity supported Sitting balance-Leahy Scale: Good     Standing balance support: Bilateral upper extremity supported, During functional activity, Reliant on assistive device for balance Standing balance-Leahy Scale: Poor Standing balance comment: needing at least 1 UE support or min A when transitioning hands                            Cognition Arousal/Alertness: Awake/alert Behavior During Therapy: WFL for tasks assessed/performed Overall Cognitive Status: Within Functional Limits for tasks assessed                                 General Comments: Pt with slightly decreased processing speed, reporting that he feels a bit off from his baseline. However, cognition WFL for tasks assessed.        Exercises General Exercises -  Lower Extremity Long Arc Quad: AROM, Both, 10 reps, Seated Hip Flexion/Marching: AROM, Both, 10 reps, Standing    General Comments General comments (skin integrity, edema, etc.): VSS on RA      Pertinent Vitals/Pain Pain Assessment Pain Assessment: Faces Faces Pain Scale: Hurts even more Pain Location: L hip with initial sit to stand Pain Descriptors / Indicators: Discomfort,  Grimacing, Guarding Pain Intervention(s): Limited activity within patient's tolerance, Monitored during session, Repositioned    Home Living                          Prior Function            PT Goals (current goals can now be found in the care plan section) Acute Rehab PT Goals Patient Stated 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 Progress towards PT goals: Progressing toward goals    Frequency    Min 3X/week      PT Plan Current plan remains appropriate    Co-evaluation              AM-PAC PT "6 Clicks" Mobility   Outcome Measure  Help needed turning from your back to your side while in a flat bed without using bedrails?: A Little Help needed moving from lying on your back to sitting on the side of a flat bed without using bedrails?: A Little Help needed moving to and from a bed to a chair (including a wheelchair)?: A Little Help needed standing up from a chair using your arms (e.g., wheelchair or bedside chair)?: A Little Help needed to walk in hospital room?: A Little Help needed climbing 3-5 steps with a railing? : Total 6 Click Score: 16    End of Session Equipment Utilized During Treatment: Gait belt Activity Tolerance: Patient tolerated treatment well Patient left: in chair;with call bell/phone within reach;with chair alarm set Nurse Communication: Mobility status PT Visit Diagnosis: Muscle weakness (generalized) (M62.81);History of falling (Z91.81);Difficulty in walking, not elsewhere classified (R26.2)     Time: 2820-6015 PT Time Calculation (min) (ACUTE ONLY): 23 min  Charges:  $Gait Training: 8-22 mins $Therapeutic Exercise: 8-22 mins                     [facility] M,PT Acute Rehab Services [place] 12/17/2021, 3:41 PM

## 2021-12-17 NOTE — Progress Notes (Signed)
Occupational Therapy Treatment Patient Details Name: Terry [name] MRN: [id number] DOB: [birth year] Today's Date: 12/17/2021   History of present illness 86 y/o male presented to ED on 12/07/21 after fall in parking lot. Had a syncopal event in ED. 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 PMH: mitral valve replacement, HTN   OT comments  Pt progressing towards goals this session, needing min guard for standing ADLs and hallway distance ambulation with RW. Pt stating he feels "weaker" and "low energy" today compared to normal, mild dizziness with standing that resolved quickly. Pt presenting with impairments listed below, will follow acutely. Continue to recommend SNF at d/c.   Recommendations for follow up therapy are one component of a multi-disciplinary discharge planning process, led by the attending physician.  Recommendations may be updated based on patient status, additional functional criteria and insurance authorization.    Follow Up Recommendations  Skilled nursing-short term rehab (<3 hours/day)    Assistance Recommended at Discharge Intermittent Supervision/Assistance  Patient can return home with the following  A little help with walking and/or transfers;Assistance with cooking/housework;Direct supervision/assist for medications management;Direct supervision/assist for financial management;Assist for transportation;Help with stairs or ramp for entrance;A lot of help with bathing/dressing/bathroom   Equipment Recommendations  None recommended by OT (defer)    Recommendations for Other Services PT consult    Precautions / Restrictions Precautions Precautions: Fall Precaution Comments: watch BP Restrictions Weight Bearing Restrictions: No       Mobility Bed Mobility               General bed mobility comments: up in chair    Transfers Overall transfer level: Needs assistance Equipment used: Rolling walker (2 wheels) Transfers: Sit to/from  Stand Sit to Stand: Min guard     Step pivot transfers: Min guard     General transfer comment: increased time to stand due to L hip pain     Balance Overall balance assessment: Needs assistance Sitting-balance support: Feet supported, No upper extremity supported Sitting balance-Leahy Scale: Good Sitting balance - Comments: Reaching to feet sitting edge of recliner.   Standing balance support: Bilateral upper extremity supported, During functional activity, Reliant on assistive device for balance Standing balance-Leahy Scale: Poor Standing balance comment: needing at least 1 UE support or min A when transitioning hands to sink in the room                           ADL either performed or assessed with clinical judgement   ADL Overall ADL's : Needs assistance/impaired     Grooming: Min guard;Standing;Oral care Grooming Details (indicate cue type and reason): oral care in standing; min guard A for safety. No physical assist needed.                 Toilet Transfer: Min guard;Comfort height toilet;Ambulation;Rolling walker (2 wheels) Toilet Transfer Details (indicate cue type and reason): simulated in room         Functional mobility during ADLs: Min guard;Rolling walker (2 wheels)      Extremity/Trunk Assessment Upper Extremity Assessment Upper Extremity Assessment: Generalized weakness (impaired FM skills, decreased coordination, bil hands and L elbow bandaged)   Lower Extremity Assessment Lower Extremity Assessment: Defer to PT evaluation        Vision   Additional Comments: pt reporting vision [facility]   Perception Perception Perception: Not tested   Praxis Praxis Praxis: Not tested    Cognition Arousal/Alertness: Awake/alert Behavior During Therapy:  WFL for tasks assessed/performed Overall Cognitive Status: Within Functional Limits for tasks assessed                                 General Comments: Pt with slightly decreased  processing speed, reporting that he feels a bit off from his baseline. However, cognition WFL for tasks assessed.        Exercises      Shoulder Instructions       General Comments VSS on RA    Pertinent Vitals/ Pain       Pain Assessment Pain Assessment: Faces Pain Score: 5  Faces Pain Scale: Hurts even more Pain Location: L hip with initial sit to stand Pain Descriptors / Indicators: Discomfort, Grimacing, Guarding Pain Intervention(s): Limited activity within patient's tolerance, Monitored during session, Repositioned  Home Living                                          Prior Functioning/Environment              Frequency  Min 2X/week        Progress Toward Goals  OT Goals(current goals can now be found in the care plan section)  Progress towards OT goals: Progressing toward goals  Acute Rehab OT Goals Patient Stated Goal: to get to rehab OT Goal Formulation: With patient Time For Goal Achievement: 12/22/21 Potential to Achieve Goals: Good ADL Goals Pt Will Perform Upper Body Dressing: with modified independence;sitting Pt Will Perform Lower Body Dressing: with modified independence;sitting/lateral leans;sit to/from stand Pt Will Transfer to Toilet: with modified independence;regular height toilet;ambulating Pt Will Perform Tub/Shower Transfer: Shower transfer;shower seat;ambulating;with modified independence Additional ADL Goal #1: pt will complete bed mobility with mod I in prep for ADLs  Plan Discharge plan remains appropriate    Co-evaluation                 AM-PAC OT "6 Clicks" Daily Activity     Outcome Measure   Help from another person eating meals?: A Little Help from another person taking care of personal grooming?: A Little Help from another person toileting, which includes using toliet, bedpan, or urinal?: A Little Help from another person bathing (including washing, rinsing, drying)?: A Lot Help from another  person to put on and taking off regular upper body clothing?: A Little Help from another person to put on and taking off regular lower body clothing?: A Lot 6 Click Score: 16    End of Session Equipment Utilized During Treatment: Gait belt;Rolling walker (2 wheels)  OT Visit Diagnosis: Other abnormalities of gait and mobility (R26.89);Unsteadiness on feet (R26.81);Muscle weakness (generalized) (M62.81)   Activity Tolerance Patient tolerated treatment well   Patient Left in chair;with call bell/phone within reach   Nurse Communication Mobility status        Time: 2831-5176 OT Time Calculation (min): 17 min  Charges: OT General Charges $OT Visit: 1 Visit OT Treatments $Therapeutic Activity: 8-22 mins  Lynnda Child, OTD, OTR/L Acute Rehab [phone]) [phone] [place] 12/17/2021, 9:59 AM

## 2021-12-18 DIAGNOSIS — R55 Syncope and collapse: Secondary | ICD-10-CM | POA: Diagnosis not present

## 2021-12-18 LAB — CBC
HCT: 25.4 % — ABNORMAL LOW (ref 39.0–52.0)
HCT: 25 % — ABNORMAL LOW (ref 39.0–52.0)
Hemoglobin: 8.3 g/dL — ABNORMAL LOW (ref 13.0–17.0)
Hemoglobin: 8.2 g/dL — ABNORMAL LOW (ref 13.0–17.0)
MCH: 32.4 pg (ref 26.0–34.0)
MCH: 32.8 pg (ref 26.0–34.0)
MCHC: 32.7 g/dL (ref 30.0–36.0)
MCHC: 32.8 g/dL (ref 30.0–36.0)
MCV: 99.2 fL (ref 80.0–100.0)
MCV: 100 fL (ref 80.0–100.0)
Platelets: 333 10*3/uL (ref 150–400)
Platelets: 357 10*3/uL (ref 150–400)
RBC: 2.56 MIL/uL — ABNORMAL LOW (ref 4.22–5.81)
RBC: 2.5 MIL/uL — ABNORMAL LOW (ref 4.22–5.81)
RDW: 14.9 % (ref 11.5–15.5)
RDW: 15.1 % (ref 11.5–15.5)
WBC: 10.7 10*3/uL — ABNORMAL HIGH (ref 4.0–10.5)
WBC: 14 10*3/uL — ABNORMAL HIGH (ref 4.0–10.5)
nRBC: 0 % (ref 0.0–0.2)
nRBC: 0 % (ref 0.0–0.2)

## 2021-12-18 LAB — HEPARIN LEVEL (UNFRACTIONATED): Heparin Unfractionated: 0.28 IU/mL — ABNORMAL LOW (ref 0.30–0.70)

## 2021-12-18 LAB — PROTIME-INR
INR: 1.6 — ABNORMAL HIGH (ref 0.8–1.2)
Prothrombin Time: 19.1 seconds — ABNORMAL HIGH (ref 11.4–15.2)

## 2021-12-18 MED ORDER — HEPARIN (PORCINE) 25000 UT/250ML-% IV SOLN
1150.0000 [IU]/h | INTRAVENOUS | Status: DC
Start: 2021-12-18 — End: 2021-12-19
  Filled 2021-12-18: qty 250

## 2021-12-18 MED ORDER — TRANEXAMIC ACID 1000 MG/10ML IV SOLN
2000.0000 mg | Freq: Once | INTRAVENOUS | Status: AC
  Administered 2021-12-18: 2000 mg via TOPICAL
  Filled 2021-12-18: qty 20

## 2021-12-18 MED ORDER — WARFARIN SODIUM 5 MG PO TABS
5.0000 mg | ORAL_TABLET | Freq: Once | ORAL | Status: AC
  Administered 2021-12-18: 5 mg via ORAL
  Filled 2021-12-18: qty 1

## 2021-12-18 NOTE — Plan of Care (Signed)

## 2021-12-18 NOTE — Progress Notes (Signed)
Physical Therapy Treatment Patient Details Name: Terry [name] MRN: [id number] DOB: February 07, 1936 Today's Date: 12/18/2021   History of Present Illness 86 y/o male presented to ED on 12/07/21 after fall in parking lot. Had a syncopal event in ED. 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 PMH: mitral valve replacement, HTN    PT Comments    Pt not able to transfer and ambulate as well today.  He was limited by L leg pain and suspect orthostatic hypotension (see general comments).  He required min-mod A for sit to stand, gait speed indicative of fall risk, and only able to ambulate ~30'.   Continue POC and recommendation for SNF as pt lives alone.     Recommendations for follow up therapy are one component of a multi-disciplinary discharge planning process, led by the attending physician.  Recommendations may be updated based on patient status, additional functional criteria and insurance authorization.  Follow Up Recommendations  Skilled nursing-short term rehab (<3 hours/day)     Assistance Recommended at Discharge Frequent or constant Supervision/Assistance  Patient can return home with the following A little help with walking and/or transfers;A little help with bathing/dressing/bathroom;Assistance with cooking/housework;Assist for transportation;Help with stairs or ramp for entrance   Equipment Recommendations  Rolling walker (2 wheels)    Recommendations for Other Services       Precautions / Restrictions Precautions Precautions: Fall Precaution Comments: watch BP     Mobility  Bed Mobility               General bed mobility comments: up in chair    Transfers Overall transfer level: Needs assistance Equipment used: Rolling walker (2 wheels) Transfers: Sit to/from Stand Sit to Stand: Min assist           General transfer comment: increased time to stand due to L hip pain; performed x 2 during session    Ambulation/Gait Ambulation/Gait  assistance: Min assist Gait Distance (Feet): 30 Feet Assistive device: Rolling walker (2 wheels) Gait Pattern/deviations: Step-to pattern, Antalgic, Trunk flexed Gait velocity: decreased Gait velocity interpretation: <1.8 ft/sec, indicate of risk for recurrent falls   General Gait Details: Pt with very antalgic gait - did improve somewhat with a few steps; min A for balance; very slow gait pattern limited by pain; distance limited due to reports of dizziness and arms starting to feel weak (suspect hypotension - see comments)   Stairs             Wheelchair Mobility    Modified Rankin (Stroke Patients Only)       Balance Overall balance assessment: Needs assistance Sitting-balance support: Feet supported, No upper extremity supported Sitting balance-Leahy Scale: Good     Standing balance support: Bilateral upper extremity supported, During functional activity, Reliant on assistive device for balance Standing balance-Leahy Scale: Poor                              Cognition Arousal/Alertness: Awake/alert Behavior During Therapy: WFL for tasks assessed/performed Overall Cognitive Status: Within Functional Limits for tasks assessed                                          Exercises General Exercises - Lower Extremity Long Arc Quad: AROM, Both, 10 reps, Seated Hip Flexion/Marching: AROM, Both, 10 reps, Seated    General Comments General comments (  skin integrity, edema, etc.): Pt reports weakness when walking so returned to chair and by time BP had taken it was 128/62 and pt feeling better.  Had pt stand and BP was 110/53 pt reports some dizziness/weakness but not as bad as when walking.  Pt also had bleeding from R hand that was dressed - bleeding stopped with pressure for 2-3 mins.  Increased time for cleaning blood that fell on pt and floor.      Pertinent Vitals/Pain Pain Assessment Pain Assessment: Faces Faces Pain Scale: Hurts even  more Pain Location: L hip Pain Descriptors / Indicators: Discomfort, Grimacing, Guarding Pain Intervention(s): Limited activity within patient's tolerance, Monitored during session, Repositioned    Home Living                          Prior Function            PT Goals (current goals can now be found in the care plan section) Progress towards PT goals: Progressing toward goals    Frequency    Min 3X/week      PT Plan Current plan remains appropriate    Co-evaluation              AM-PAC PT "6 Clicks" Mobility   Outcome Measure  Help needed turning from your back to your side while in a flat bed without using bedrails?: A Little Help needed moving from lying on your back to sitting on the side of a flat bed without using bedrails?: A Little Help needed moving to and from a bed to a chair (including a wheelchair)?: A Little Help needed standing up from a chair using your arms (e.g., wheelchair or bedside chair)?: A Lot Help needed to walk in hospital room?: A Lot Help needed climbing 3-5 steps with a railing? : Total 6 Click Score: 14    End of Session Equipment Utilized During Treatment: Gait belt Activity Tolerance: Patient tolerated treatment well Patient left: in chair;with call bell/phone within reach;with chair alarm set Nurse Communication: Mobility status PT Visit Diagnosis: Muscle weakness (generalized) (M62.81);History of falling (Z91.81);Difficulty in walking, not elsewhere classified (R26.2)     Time: 1329-1401 PT Time Calculation (min) (ACUTE ONLY): 32 min  Charges:  $Gait Training: 8-22 mins $Therapeutic Activity: 8-22 mins                     Abran Richard, PT Acute Rehab [facility] [place] 12/18/2021, 3:36 PM

## 2021-12-18 NOTE — Progress Notes (Signed)
PROGRESS NOTE        PATIENT DETAILS Name: Terry [name] Age: 86 y.o. Sex: male Date of Birth: 09/12/1935 Admit Date: 12/07/2021 Admitting Physician Orene Desanctis, DO ULA:[id number], [name], MD  Brief Summary: Patient is a 86 y.o.  male with history of mitral valve replacement with a mechanical valve in 2003, HLD, HTN, BPH-who works as a Psychologist, occupational here at [facility] to the hospital following a mechanical fall while walking in the parking lot (tripped on a speed bump)-unfortunately while in the ED-he sustained a syncopal episode and subsequently admitted to the [facility] hospitalist service.  See below for further details.  He has now stabilized currently on heparin and Coumadin overlap waiting for INR to reach 2.5.   Significant events: 7/28>> fall at [facility] tears on bilateral arms/face-s/p suturing/combat gauze/pressure-subsequently when attempting to ambulate-he syncopized.  Admit to [facility]. 7/29>> orthostatic vital signs positive  Significant studies: 7/28>> x-ray left forearm: No fracture/dislocation.  Soft tissue swelling over the dorsum. 7/28>> x-ray right hand: No fracture. 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 7/28>> CT maxillofacial: Left periorbital soft tissue swelling without acute fracture.  7/29 >>  MRI - 1. No acute intracranial abnormality. 2. Small left periorbital/facial contusion. 3. Chronic encephalomalacia of with hemosiderin staining at the right occipital lobe, consistent with remote hemorrhagic infarct and/or bleed at this location. 4. Generalized age-related cerebral atrophy with chronic small vessel ischemic disease, with a few scattered remote lacunar infarcts involving the pons/midbrain and cerebellum. Overall, changes are progressed as compared to 2017. 5. Multiple scattered chronic micro hemorrhages involving both cerebral and cerebellar hemispheres. Findings are  nonspecific, and could be related to chronic underlying hypertension or possibly cerebral amyloid angiopathy  7/29 >> TTE -  1. Study is not well visualized. Left ventricular ejection fraction, by estimation, is 60 to 65%. The left ventricle has normal function. The left ventricle has no regional wall motion abnormalities. Left ventricular diastolic parameters are consistent with Grade I diastolic dysfunction (impaired relaxation).  2. Right ventricular systolic function is normal. The right ventricular size is not well visualized. Tricuspid regurgitation signal is inadequate for assessing PA pressure.  3. S/p mechanical MVR 2003. MV mean gradient 4 mmHg at HR 92 bpm. No significant stenosis; normal prosthesis. No evidence of mitral valve regurgitation.  4. The aortic valve was not well visualized. Aortic valve regurgitation is not visualized. No aortic stenosis is present.  5. The inferior vena cava is normal in size with greater than 50% respiratory variability, suggesting right atrial pressure of 3 mmHg  Significant microbiology data: None  Procedures: None  Consults:  None  Subjective: Patient in bed, appears comfortable, denies any headache, no fever, no chest pain or pressure, no shortness of breath , no abdominal pain. No new focal weakness.   Objective: Vitals: Blood pressure 105/78, pulse (!) 110, temperature 97.9 F (36.6 C), temperature source Axillary, resp. rate 16, height '5\' 11"'$  (1.803 m), weight 74.8 kg, SpO2 100 %.   Exam:  Alert awake-not in any distress.  Left periorbital ecchymosis present with bandage.  Numerous bandages present-on both arms, large left hip hematoma  [place].AT,PERRAL Supple Neck, No JVD,   Symmetrical Chest wall movement, Good air movement bilaterally, CTAB RRR,No Gallops, Rubs or new Murmurs,  +ve B.Sounds, Abd Soft, No tenderness,   No Cyanosis, Clubbing  or edema     Assessment/Plan:  Syncope: Likely orthostatic hypotension-sustained a fall at  home and incurred severe left periorbital bruise and laceration, multiple wounds in upper extremities requiring bandages, lives by himself, also on Coumadin with high INR.  Overall weak and frail do not think he is safe for discharge by himself to home at this point, continue PT OT, advance activity, echo, MRI nonacute.  Orthostatics have improved, stable on telemetry, looking for SNF bed discharge once INR reaches 2.5 with stable H&H.  Continue heparin and Coumadin overlap.  Mechanical fall with soft tissue injury involving left pelvic periorbital area/bilateral upper extremities with bleeding and acute blood loss anemia: Supportive care-mobilize with PT/OT.  Per patient he is updated with his tetanus vaccine.  Note-he required suture to his left forearm small arterial bleeding.  He is still bleeding intermittently during dressing changes and H&H is now gradually falling, will stop his Coumadin on 12/11/2021 currently on heparin drip.  Transfused 1 unit of packed RBC on 12/12/2021 and continue to monitor H&H along with INR/PTT.  H&H remained stable over 48 hours, dressing change on 12/14/2021 did not reveal any fresh bleeding, commands Coumadin with caution on 12/15/2021.  Quite tricky situation with multiple soft tissue injuries and bleeding sites with underlying mechanical mitral valve  Mechanical mitral valve replacement: On Coumadin, Goal INR 2.5-3.5, plz see anticoagulation discussion above.Continue heparin and Coumadin overlap.  Low-density changes seen on occipital lobe on CT imaging: MRI brain nonacute.  HTN: Blood pressure is improved low-dose beta-blocker added.  Chronic anemia with some acute drop due to soft tissue injury related blood loss and frequent blood draws.  Agreeable for transfusion if needed type screen done for 12/19/2021, due to his advanced age and frail status will likely transfuse him if he drops below 8.    HLD: On statin  BMI: Estimated body mass index is 23.01 kg/m as calculated  from the following:   Height as of this encounter: '5\' 11"'$  (1.803 m).   Weight as of this encounter: 74.8 kg.   Code status:   Code Status: DNR   DVT Prophylaxis:Place TED hose Start: 12/09/21 0849 Place TED hose Start: 12/08/21 1008 warfarin (COUMADIN) tablet 5 mg    Family Communication: None at bedside   Disposition Plan: SNF once H&H and INR stabilized   Diet: Diet Order             Diet regular Room service appropriate? Yes; Fluid consistency: Thin  Diet effective now                   MEDICATIONS: Scheduled Meds:  ferrous sulfate  325 mg Oral BID WC   folic acid  1 mg Oral Daily   metoprolol tartrate  25 mg Oral BID   rosuvastatin  20 mg Oral Daily   warfarin  5 mg Oral ONCE-1600   Warfarin - Pharmacist Dosing Inpatient   Does not apply q1600   Continuous Infusions:  heparin 1,150 Units/hr (12/18/21 0810)    PRN Meds:.   I have personally reviewed following labs and imaging studies  LABORATORY DATA:  Recent Labs  Lab 12/14/21 0609 12/15/21 0241 12/16/21 0440 12/17/21 0556 12/18/21 0544  WBC 9.1 9.4 9.3 10.3 10.7*  HGB 8.5* 8.5* 8.7* 8.8* 8.3*  HCT 25.4* 25.9* 27.2* 26.8* 25.4*  PLT 232 270 290 320 333  MCV 95.8 98.5 100.0 99.3 99.2  MCH 32.1 32.3 32.0 32.6 32.4  MCHC 33.5 32.8 32.0 32.8 32.7  RDW  14.0 14.3 14.6 14.6 14.9    Recent Labs  Lab 12/12/21 0647 12/13/21 0603 12/14/21 0609 12/15/21 0241 12/16/21 0440 12/17/21 0556 12/18/21 0544  NA 137 138 138  --   --  136  --   K 3.7 3.9 3.7  --   --  3.7  --   CL 105 106 107  --   --  105  --   CO2 '26 26 26  '$ --   --  24  --   GLUCOSE 94 87 100*  --   --  93  --   BUN 23 24* 19  --   --  19  --   CREATININE 0.83 0.82 0.79  --   --  0.84  --   CALCIUM 8.1* 8.2* 8.2*  --   --  8.3*  --   MG 1.9 2.1  --   --   --   --   --   INR 2.8* 2.1* 1.5* 1.3* 1.2 1.4* 1.6*  BNP 115.5* 165.0*  --   --   --   --   --     RADIOLOGY STUDIES/RESULTS: No results found.   LOS: 9 days    Signature  Lala Lund M.D on 12/18/2021 at 10:18 AM   -  To page go to [email or web address]

## 2021-12-18 NOTE — Plan of Care (Signed)

## 2021-12-18 NOTE — Progress Notes (Signed)
ANTICOAGULATION CONSULT NOTE - Follow Up Consult  Pharmacy Consult for heparin Indication:  MVR  Labs: Recent Labs    12/16/21 0440 12/17/21 0556 12/18/21 0544  HGB 8.7* 8.8* 8.3*  HCT 27.2* 26.8* 25.4*  PLT 290 320 333  LABPROT 15.2 16.5* 19.1*  INR 1.2 1.4* 1.6*  HEPARINUNFRC 0.37 0.33 0.28*  CREATININE  --  0.84  --      Assessment: 86yo male presenting 7/28 s/p syncope/fall, continuing on heparin for hx mechanical MVR. Warfarin PTA was held on 8/1 with intermittent bleeding from wounds. Now Pharmacy consulted to resume with heparin bridge. INR 3.5 on admission.   Heparin level is subtherapeutic at 0.28 on 1050 units/hr. INR is subtherapeutic at 1.6. No bleeding noted, Hgb stable in 8s, platelets are normal.  Warfarin PTA dose - '5mg'$  daily except 2.'5mg'$  on Mon per last anticoagulation outpatient clinic note (but '5mg'$  daily except 2.'5mg'$  on Mon and Fri per med rec)  Goal of Therapy:  Heparin level 0.3-0.5 units/ml INR 2.5-3.5 Monitor platelets by anticoagulation protocol: Yes  Plan:  Increase heparin infusion to 1150 units/hr - d/c when INR >2.5 Warfarin '5mg'$  PO x 1 Monitor daily heparin level/CBC/INR, s/sx bleeding  Thank you for involving pharmacy in this patient's care.  Renold Genta, PharmD, BCPS Clinical Pharmacist Clinical phone for 12/18/2021 until 3p is [phone] 12/18/2021 7:42 AM

## 2021-12-18 NOTE — Progress Notes (Signed)
1503 changed R hand dressing as patient was bleeding through the dressing from yesterday after working with PT, Dr. Candiss Norse notified, and [place] RN messaged, orders for wound care continued with no changes at this time.   [place] Linus Orn noticed patient had bled through new dressing, changed it again and patient had increased brisk bleeding, MD notified, orders to hold IV heparin for 4 hours, hold pressure, elevate hand and administer topical Tranexamic acid. Dr. Bonner Puna at bedside to assess.   1839 4x4 gauze soaked in Tranexamic acid applied to wound with alginate dressing over top and pressure dressings, ABD pad, kerlix and coban applied after that per Dr. Bonner Puna. Hand still elevated. And Heparin to resume at 2000.

## 2021-12-18 NOTE — Progress Notes (Signed)
ANTICOAGULATION CONSULT NOTE - Follow Up Consult  Pharmacy Consult for heparin Indication:  MVR  Labs: Recent Labs    12/16/21 0440 12/17/21 0556 12/18/21 0544  HGB 8.7* 8.8* 8.3*  HCT 27.2* 26.8* 25.4*  PLT 290 320 333  LABPROT 15.2 16.5* 19.1*  INR 1.2 1.4* 1.6*  HEPARINUNFRC 0.37 0.33 0.28*  CREATININE  --  0.84  --      Assessment: 86yo male presenting 7/28 s/p syncope/fall, continuing on heparin for hx mechanical MVR. Warfarin PTA was held on 8/1 with intermittent bleeding from wounds. Now Pharmacy consulted to resume with heparin bridge. INR 3.5 on admission.   Heparin level is subtherapeutic at 0.28 on 1050 units/hr. INR is subtherapeutic at 1.6. No bleeding noted, Hgb stable in 8s, platelets are normal.  Warfarin PTA dose - '5mg'$  daily except 2.'5mg'$  on Mon per last anticoagulation outpatient clinic note (but '5mg'$  daily except 2.'5mg'$  on Mon and Fri per med rec)  There was some oozing from wound this PM. Heparin was held for a bit. It has gotten better. Ok to resume heparin at [place] per Dr. Candiss Norse.  Goal of Therapy:  Heparin level 0.3-0.5 units/ml INR 2.5-3.5 Monitor platelets by anticoagulation protocol: Yes  Plan:  Resume heparin infusion 1150 units/hr - d/c when INR >2.5 Warfarin '5mg'$  PO x 1 Monitor daily heparin level/CBC/INR, s/sx bleeding  Onnie Boer, PharmD, BCIDP, AAHIVP, CPP Infectious Disease Pharmacist 12/18/2021 6:37 PM

## 2021-12-18 NOTE — Progress Notes (Signed)
Topical TXA to hep with bleeding on hand per Dr Candiss Norse.  Onnie Boer, PharmD, BCIDP, AAHIVP, CPP Infectious Disease Pharmacist 12/18/2021 5:18 PM

## 2021-12-19 DIAGNOSIS — Z952 Presence of prosthetic heart valve: Secondary | ICD-10-CM | POA: Diagnosis not present

## 2021-12-19 DIAGNOSIS — D62 Acute posthemorrhagic anemia: Secondary | ICD-10-CM

## 2021-12-19 DIAGNOSIS — R55 Syncope and collapse: Secondary | ICD-10-CM | POA: Diagnosis not present

## 2021-12-19 LAB — CBC
HCT: 22.8 % — ABNORMAL LOW (ref 39.0–52.0)
Hemoglobin: 7.3 g/dL — ABNORMAL LOW (ref 13.0–17.0)
MCH: 32.4 pg (ref 26.0–34.0)
MCHC: 32 g/dL (ref 30.0–36.0)
MCV: 101.3 fL — ABNORMAL HIGH (ref 80.0–100.0)
Platelets: 343 10*3/uL (ref 150–400)
RBC: 2.25 MIL/uL — ABNORMAL LOW (ref 4.22–5.81)
RDW: 15.2 % (ref 11.5–15.5)
WBC: 14.2 10*3/uL — ABNORMAL HIGH (ref 4.0–10.5)
nRBC: 0 % (ref 0.0–0.2)

## 2021-12-19 LAB — PREPARE RBC (CROSSMATCH)

## 2021-12-19 LAB — BASIC METABOLIC PANEL
Anion gap: 10 (ref 5–15)
BUN: 26 mg/dL — ABNORMAL HIGH (ref 8–23)
CO2: 22 mmol/L (ref 22–32)
Calcium: 8.1 mg/dL — ABNORMAL LOW (ref 8.9–10.3)
Chloride: 102 mmol/L (ref 98–111)
Creatinine, Ser: 0.86 mg/dL (ref 0.61–1.24)
GFR, Estimated: >60 mL/min (ref >60)
Glucose, Bld: 119 mg/dL — ABNORMAL HIGH (ref 70–99)
Potassium: 4.3 mmol/L (ref 3.5–5.1)
Sodium: 134 mmol/L — ABNORMAL LOW (ref 135–145)

## 2021-12-19 LAB — PROTIME-INR
INR: 2.5 — ABNORMAL HIGH (ref 0.8–1.2)
Prothrombin Time: 26.6 seconds — ABNORMAL HIGH (ref 11.4–15.2)

## 2021-12-19 LAB — HEPARIN LEVEL (UNFRACTIONATED): Heparin Unfractionated: 0.32 IU/mL (ref 0.30–0.70)

## 2021-12-19 MED ORDER — PANTOPRAZOLE SODIUM 40 MG PO TBEC
40.0000 mg | DELAYED_RELEASE_TABLET | ORAL | Status: DC
  Administered 2021-12-19 – 2021-12-23 (×3): 40 mg via ORAL
  Filled 2021-12-19 (×6): qty 1

## 2021-12-19 MED ORDER — SODIUM CHLORIDE 0.9% IV SOLUTION: Freq: Once | INTRAVENOUS | Status: AC

## 2021-12-19 MED ORDER — POLYETHYLENE GLYCOL 3350 17 G PO PACK
17.0000 g | PACK | Freq: Every day | ORAL | Status: DC | PRN
  Administered 2021-12-19: 17 g via ORAL
  Filled 2021-12-19: qty 1

## 2021-12-19 MED ORDER — WARFARIN SODIUM 5 MG PO TABS
5.0000 mg | ORAL_TABLET | Freq: Once | ORAL | Status: AC
  Administered 2021-12-19: 5 mg via ORAL
  Filled 2021-12-19: qty 1

## 2021-12-19 NOTE — TOC Progression Note (Signed)
Transition of Care [facility]) - Progression Note    Patient Details  Name: Terry [name] MRN: [id number] Date of Birth: 1936/01/22  Transition of Care [facility]) CM/SW [place], [place] Phone Number: 12/19/2021, 10:13 AM  Clinical Narrative:   CSW updated [facility] on patient's medical condition, not medically stable for SNF at this time. CSW to follow.    Expected Discharge Plan: [place] Barriers to Discharge: Continued Medical Work up, Ship broker  Expected Discharge Plan and Services Expected Discharge Plan: [place] Choice: [place] arrangements for the past 2 months: Single Family Home                                       Social Determinants of Health (SDOH) Interventions    Readmission Risk Interventions     No data to display

## 2021-12-19 NOTE — Progress Notes (Signed)
ANTICOAGULATION CONSULT NOTE - Follow Up Consult  Pharmacy Consult for coumadin Indication:  MVR  Labs: Recent Labs    12/17/21 0556 12/18/21 0544 12/18/21 1808 12/19/21 0454  HGB 8.8* 8.3* 8.2* 7.3*  HCT 26.8* 25.4* 25.0* 22.8*  PLT 320 333 357 343  LABPROT 16.5* 19.1*  --  26.6*  INR 1.4* 1.6*  --  2.5*  HEPARINUNFRC 0.33 0.28*  --  0.32  CREATININE 0.84  --   --  0.86     Assessment: 86yo male presenting 7/28 s/p syncope/fall, continuing on heparin for hx mechanical MVR. Warfarin PTA was held on 8/1 with intermittent bleeding from wounds. Now Pharmacy consulted to resume with heparin bridge. INR 3.5 on admission.   Heparin level is subtherapeutic at 0.28 on 1050 units/hr. INR is subtherapeutic at 1.6. No bleeding noted, Hgb stable in 8s, platelets are normal.  Warfarin PTA dose - '5mg'$  daily except 2.'5mg'$  on Mon per last anticoagulation outpatient clinic note (but '5mg'$  daily except 2.'5mg'$  on Mon and Fri per med rec)  There was some oozing from wound this PM. Heparin was held for a bit. It has gotten better. Ok to resume heparin at [place] per Dr. Candiss Norse.  12/19/2021 HL 0.32 / INR 2.5 Patient bled for 3 hours from his hand on overnight shift. Provider was contacted by night RN and instructions were given to stop the heparin infusion. Spoke with Dr. Waldron Labs this morning. He would like heparin discontinued and warfarin continued only at this point in time.      Goal of Therapy:  INR 2.5-3.5 Monitor platelets by anticoagulation protocol: Yes  Plan:  Discontinue heparin infusion Warfarin '5mg'$  PO x 1 Monitor daily CBC/INR, s/sx bleeding  Mairely Foxworth BS, PharmD, BCPS Clinical Pharmacist 12/19/2021 9:11 AM  Contact: [phone] after 3 PM  "Be curious, not judgmental..." -Jamal Maes

## 2021-12-19 NOTE — Progress Notes (Signed)
PROGRESS NOTE        PATIENT DETAILS Name: Terry [name] Age: 86 y.o. Sex: male Date of Birth: 1936/04/21 Admit Date: 12/07/2021 Admitting Physician Orene Desanctis, DO HFW:YOVZCH, [name], MD  Brief Summary:  Patient is a 86 y.o.  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  See below for further details.  He has now stabilized currently on heparin and Coumadin overlap waiting for INR to reach 2.5.   Significant events: 7/28>> fall at [facility] tears on bilateral arms/face-s/p suturing/combat gauze/pressure-subsequently when attempting to ambulate-he syncopized.  Admit to [facility]. 7/29>> orthostatic vital signs positive  Significant studies: 7/28>> x-ray left forearm: No fracture/dislocation.  Soft tissue swelling over the dorsum. 7/28>> x-ray right hand: No fracture. 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 7/28>> CT maxillofacial: Left periorbital soft tissue swelling without acute fracture.  7/29 >>  MRI - 1. No acute intracranial abnormality. 2. Small left periorbital/facial contusion. 3. Chronic encephalomalacia of with hemosiderin staining at the right occipital lobe, consistent with remote hemorrhagic infarct and/or bleed at this location. 4. Generalized age-related cerebral atrophy with chronic small vessel ischemic disease, with a few scattered remote lacunar infarcts involving the pons/midbrain and cerebellum. Overall, changes are progressed as compared to 2017. 5. Multiple scattered chronic micro hemorrhages involving both cerebral and cerebellar hemispheres. Findings are  nonspecific, and could be related to chronic underlying hypertension or possibly cerebral amyloid angiopathy  7/29 >> TTE -  1. Study is not well visualized. Left ventricular ejection fraction, by estimation, is 60 to 65%. The left ventricle has normal function. The left ventricle has no regional wall motion abnormalities. Left ventricular diastolic parameters are consistent with Grade I diastolic dysfunction (impaired relaxation).  2. Right ventricular systolic function is normal. The right ventricular size is not well visualized. Tricuspid regurgitation signal is inadequate for assessing PA pressure.  3. S/p mechanical MVR 2003. MV mean gradient 4 mmHg at HR 92 bpm. No significant stenosis; normal prosthesis. No evidence of mitral valve regurgitation.  4. The aortic valve was not well visualized. Aortic valve regurgitation is not visualized. No aortic stenosis is present.  5. The inferior vena cava is normal in size with greater than 50% respiratory variability, suggesting right atrial pressure of 3 mmHg  Significant microbiology data: None  Procedures: None  Consults:  None  Subjective:  Had bleeding during right hand dressing change yesterday, he was dizzy/lightheaded this morning upon standing up.  .  Objective: Vitals: Blood pressure 128/60, pulse 100, temperature 98 F (36.7 C), temperature source Oral, resp. rate 18, height '5\' 11"'$  (1.803 m), weight 74.8 kg, SpO2 98 %.   Exam:  Alert awake-not in any distress.  Left periorbital ecchymosis present with bandage.  Numerous bandages present-on both arms, large left hip hematoma  Symmetrical Chest wall movement, Good air movement bilaterally, CTAB RRR,No Gallops,Rubs or new Murmurs, No Parasternal Heave +ve B.Sounds, Abd Soft, No tenderness, No rebound - guarding or rigidity. No Cyanosis, Clubbing or edema, No new Rash or bruise  Assessment/Plan:  Syncope:  - Likely orthostatic hypotension-sustained a fall at home and  incurred severe left periorbital bruise and laceration, multiple wounds in upper extremities requiring bandages, lives by himself, also on Coumadin with high INR.  Overall weak and frail do not think he is safe for discharge by himself to home at this point, continue PT OT, advance activity, echo, MRI nonacute.  Orthostatics have improved, stable on telemetry, looking for SNF bed discharge once INR reaches 2.5 with stable H&H.  Continue heparin and Coumadin overlap. -Patient this morning is dizzy, lightheaded, likely from anemia and orthostasis, will do compression stocking and transfuse 1 unit PRBC  Mechanical fall with soft tissue injury involving left pelvic periorbital area/bilateral upper extremities with bleeding and acute blood loss anemia: Supportive care-mobilize with PT/OT.  Per patient he is updated with his tetanus vaccine.  Note-he required suture to his left forearm small arterial bleeding.  He is still bleeding intermittently during dressing changes and H&H is now gradually falling, will stop his Coumadin on 12/11/2021 currently on heparin drip.  Transfused 1 unit of packed RBC on 12/12/2021 and continue to monitor H&H along with INR/PTT.  H&H remained stable over 48 hours, dressing change on 12/14/2021 did not reveal any fresh bleeding, commands Coumadin with caution on 12/15/2021.  Quite tricky situation with multiple soft tissue injuries and bleeding sites with underlying mechanical mitral valve -Today INR is therapeutic at 2.5, will discontinue heparin drip given significant oozing from right hand during dressing change yesterday, will keep dressing for next 48 hours.    Mechanical mitral valve replacement: On Coumadin, Goal INR 2.5-3.5, plz see anticoagulation discussion above.Continue heparin and Coumadin overlap.  Low-density changes seen on occipital lobe on CT imaging: MRI brain nonacute.  HTN: Blood pressure is improved low-dose beta-blocker added.  Chronic anemia with some acute drop  due to soft tissue injury related blood loss and frequent blood draws.  Acute on chronic blood loss anemia -Hemoglobin low at 7.3 today, will give 1 unit PRBC.   HLD: On statin  BMI: Estimated body mass index is 23.01 kg/m as calculated from the following:   Height as of this encounter: '5\' 11"'$  (1.803 m).   Weight as of this encounter: 74.8 kg.   Code status:   Code Status: DNR   DVT Prophylaxis:Place TED hose Start: 12/09/21 0849 Place TED hose Start: 12/08/21 1008 warfarin (COUMADIN) tablet 5 mg    Family Communication: None at bedside   Disposition Plan: SNF once H&H and INR stabilized   Diet: Diet Order             Diet regular Room service appropriate? Yes; Fluid consistency: Thin  Diet effective now                   MEDICATIONS: Scheduled Meds:  sodium chloride   Intravenous Once   ferrous sulfate  325 mg Oral BID WC   folic acid  1 mg Oral Daily   metoprolol tartrate  25 mg Oral BID   rosuvastatin  20 mg Oral Daily   warfarin  5 mg Oral ONCE-1600   Warfarin - Pharmacist Dosing Inpatient   Does not apply q1600   Continuous Infusions:    PRN Meds:.   I have personally reviewed following labs and imaging studies  LABORATORY DATA:  Recent Labs  Lab 12/16/21 0440 12/17/21 0556 12/18/21 0544 12/18/21 1808 12/19/21 0454  WBC 9.3 10.3 10.7* 14.0* 14.2*  HGB 8.7* 8.8* 8.3* 8.2* 7.3*  HCT  27.2* 26.8* 25.4* 25.0* 22.8*  PLT 290 320 333 357 343  MCV 100.0 99.3 99.2 100.0 101.3*  MCH 32.0 32.6 32.4 32.8 32.4  MCHC 32.0 32.8 32.7 32.8 32.0  RDW 14.6 14.6 14.9 15.1 15.2    Recent Labs  Lab 12/13/21 0603 12/14/21 0609 12/15/21 0241 12/16/21 0440 12/17/21 0556 12/18/21 0544 12/19/21 0454  NA 138 138  --   --  136  --  134*  K 3.9 3.7  --   --  3.7  --  4.3  CL 106 107  --   --  105  --  102  CO2 26 26  --   --  24  --  22  GLUCOSE 87 100*  --   --  93  --  119*  BUN 24* 19  --   --  19  --  26*  CREATININE 0.82 0.79  --   --  0.84  --   0.86  CALCIUM 8.2* 8.2*  --   --  8.3*  --  8.1*  MG 2.1  --   --   --   --   --   --   INR 2.1* 1.5* 1.3* 1.2 1.4* 1.6* 2.5*  BNP 165.0*  --   --   --   --   --   --     RADIOLOGY STUDIES/RESULTS: No results found.   LOS: 10 days   Signature  Phillips Climes M.D on 12/19/2021 at 12:07 PM   -  To page go to [email or web address]

## 2021-12-20 DIAGNOSIS — R55 Syncope and collapse: Secondary | ICD-10-CM | POA: Diagnosis not present

## 2021-12-20 DIAGNOSIS — Z952 Presence of prosthetic heart valve: Secondary | ICD-10-CM | POA: Diagnosis not present

## 2021-12-20 DIAGNOSIS — D62 Acute posthemorrhagic anemia: Secondary | ICD-10-CM | POA: Diagnosis not present

## 2021-12-20 LAB — PROTIME-INR
INR: 2.8 — ABNORMAL HIGH (ref 0.8–1.2)
Prothrombin Time: 29.4 seconds — ABNORMAL HIGH (ref 11.4–15.2)

## 2021-12-20 LAB — PREPARE RBC (CROSSMATCH)

## 2021-12-20 LAB — CBC
HCT: 23.1 % — ABNORMAL LOW (ref 39.0–52.0)
Hemoglobin: 7.6 g/dL — ABNORMAL LOW (ref 13.0–17.0)
MCH: 31.4 pg (ref 26.0–34.0)
MCHC: 32.9 g/dL (ref 30.0–36.0)
MCV: 95.5 fL (ref 80.0–100.0)
Platelets: 284 10*3/uL (ref 150–400)
RBC: 2.42 MIL/uL — ABNORMAL LOW (ref 4.22–5.81)
RDW: 16.9 % — ABNORMAL HIGH (ref 11.5–15.5)
WBC: 12.2 10*3/uL — ABNORMAL HIGH (ref 4.0–10.5)
nRBC: 0 % (ref 0.0–0.2)

## 2021-12-20 MED ORDER — WARFARIN SODIUM 2.5 MG PO TABS
2.5000 mg | ORAL_TABLET | ORAL | Status: DC
  Administered 2021-12-21: 2.5 mg via ORAL
  Filled 2021-12-20 (×2): qty 1

## 2021-12-20 MED ORDER — WARFARIN SODIUM 5 MG PO TABS
5.0000 mg | ORAL_TABLET | ORAL | Status: DC
  Administered 2021-12-20 – 2021-12-22 (×2): 5 mg via ORAL
  Filled 2021-12-20 (×2): qty 1

## 2021-12-20 MED ORDER — SODIUM CHLORIDE 0.9% IV SOLUTION
Freq: Once | INTRAVENOUS | Status: AC
Start: 2021-12-20 — End: 2021-12-20

## 2021-12-20 NOTE — Progress Notes (Signed)
PROGRESS NOTE        PATIENT DETAILS Name: Terry [name] Age: 86 y.o. Sex: male Date of Birth: [birth year] Admit Date: 12/07/2021 Admitting Physician Orene Desanctis, DO EHM:CNOBSJ, [name], MD  Brief Summary:  Patient is a 86 y.o.  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  See below for further details.  He has now stabilized currently on heparin and Coumadin overlap waiting for INR to reach 2.5.   Significant events: 7/28>> fall at [facility] tears on bilateral arms/face-s/p suturing/combat gauze/pressure-subsequently when attempting to ambulate-he syncopized.  Admit to [facility]. 7/29>> orthostatic vital signs positive  Significant studies: 7/28>> x-ray left forearm: No fracture/dislocation.  Soft tissue swelling over the dorsum. 7/28>> x-ray right hand: No fracture. 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 7/28>> CT maxillofacial: Left periorbital soft tissue swelling without acute fracture.  7/29 >>  MRI - 1. No acute intracranial abnormality. 2. Small left periorbital/facial contusion. 3. Chronic encephalomalacia of with hemosiderin staining at the right occipital lobe, consistent with remote hemorrhagic infarct and/or bleed at this location. 4. Generalized age-related cerebral atrophy with chronic small vessel ischemic disease, with a few scattered remote lacunar infarcts involving the pons/midbrain and cerebellum. Overall, changes are progressed as compared to 2017. 5. Multiple scattered chronic micro hemorrhages involving both cerebral and cerebellar hemispheres. Findings are  nonspecific, and could be related to chronic underlying hypertension or possibly cerebral amyloid angiopathy  7/29 >> TTE -  1. Study is not well visualized. Left ventricular ejection fraction, by estimation, is 60 to 65%. The left ventricle has normal function. The left ventricle has no regional wall motion abnormalities. Left ventricular diastolic parameters are consistent with Grade I diastolic dysfunction (impaired relaxation).  2. Right ventricular systolic function is normal. The right ventricular size is not well visualized. Tricuspid regurgitation signal is inadequate for assessing PA pressure.  3. S/p mechanical MVR 2003. MV mean gradient 4 mmHg at HR 92 bpm. No significant stenosis; normal prosthesis. No evidence of mitral valve regurgitation.  4. The aortic valve was not well visualized. Aortic valve regurgitation is not visualized. No aortic stenosis is present.  5. The inferior vena cava is normal in size with greater than 50% respiratory variability, suggesting right atrial pressure of 3 mmHg  Significant microbiology data: None  Procedures: None  Consults:  None  Subjective:   He reports his dizziness and lightheadedness much improved, he still reports generalized weakness and fatigue  Objective: Vitals: Blood pressure (!) 109/44, pulse 98, temperature 98.4 F (36.9 C), temperature source Oral, resp. rate 18, height '5\' 11"'$  (1.803 m), weight 74.8 kg, SpO2 96 %.   Exam:  Alert awake-not in any distress.  Left periorbital ecchymosis present with bandage.  Numerous bandages present-on both arms, large left hip hematoma  Symmetrical Chest wall movement, Good air movement bilaterally, CTAB RRR,No Gallops,Rubs or new Murmurs, No Parasternal Heave +ve B.Sounds, Abd Soft, No tenderness, No rebound - guarding or rigidity. No Cyanosis, Clubbing or edema, No new Rash or bruise  Assessment/Plan:  Syncope:  - Likely orthostatic hypotension-sustained a fall at home and  incurred severe left periorbital bruise and laceration, multiple wounds in upper extremities requiring bandages, lives by himself, also on Coumadin with high INR.  Overall weak and frail do not think he is safe for discharge by himself to home at this point, continue PT OT, advance activity, echo, MRI nonacute.  Orthostatics have improved, stable on telemetry, looking for SNF bed discharge once INR reaches 2.5 with stable H&H.  Continue heparin and Coumadin overlap. -Patient this morning is dizzy, lightheaded, likely from anemia and orthostasis, will do compression stocking and transfuse 1 unit PRBC  Mechanical fall with soft tissue injury involving left pelvic periorbital area/bilateral upper extremities with bleeding and acute blood loss anemia: Supportive care-mobilize with PT/OT.  Per patient he is updated with his tetanus vaccine.  Note-he required suture to his left forearm small arterial bleeding.  He is still bleeding intermittently during dressing changes and H&H is now gradually falling, will stop his Coumadin on 12/11/2021 currently on heparin drip.  Transfused 1 unit of packed RBC on 12/12/2021 and continue to monitor H&H along with INR/PTT.  H&H remained stable over 48 hours, dressing change on 12/14/2021 did not reveal any fresh bleeding, commands Coumadin with caution on 12/15/2021.  Quite tricky situation with multiple soft tissue injuries and bleeding sites with underlying mechanical mitral valve -Today INR is therapeutic at 2.5, will discontinue heparin drip given significant oozing from right hand during dressing change yesterday, will keep dressing for next 24 hours.    Mechanical mitral valve replacement: On Coumadin, Goal INR 2.5-3.5, plz see anticoagulation discussion above, now INR therapeutic, warfarin has been discontinued.  Low-density changes seen on occipital lobe on CT imaging: MRI brain nonacute.  HTN: Blood pressure is improved low-dose beta-blocker added.  Chronic anemia Acute  on chronic blood loss anemia -He received 1 unit PRBC yesterday, hemoglobin with some improvement, but remains on the lower side, and he still with generalized weakness and fatigue, will give another unit today.  HLD: On statin  BMI: Estimated body mass index is 23.01 kg/m as calculated from the following:   Height as of this encounter: '5\' 11"'$  (1.803 m).   Weight as of this encounter: 74.8 kg.   Code status:   Code Status: DNR   DVT Prophylaxis:Place TED hose Start: 12/09/21 0849 Place TED hose Start: 12/08/21 1008 warfarin (COUMADIN) tablet 5 mg  warfarin (COUMADIN) tablet 2.5 mg    Family Communication: None at bedside   Disposition Plan: SNF once H&H and INR stabilized   Diet: Diet Order             Diet regular Room service appropriate? Yes; Fluid consistency: Thin  Diet effective now                   MEDICATIONS: Scheduled Meds:  sodium chloride   Intravenous Once   ferrous sulfate  325 mg Oral BID WC   folic acid  1 mg Oral Daily   metoprolol tartrate  25 mg Oral BID   pantoprazole  40 mg Oral QODAY   rosuvastatin  20 mg Oral Daily   [START ON 12/21/2021] warfarin  2.5 mg Oral Once per day on Sun Fri   warfarin  5 mg Oral Once per day on Mon Tue Wed Thu Sat   Warfarin - Pharmacist Dosing Inpatient   Does not apply q1600   Continuous Infusions:    PRN Meds:.   I  have personally reviewed following labs and imaging studies  LABORATORY DATA:  Recent Labs  Lab 12/17/21 0556 12/18/21 0544 12/18/21 1808 12/19/21 0454 12/20/21 0318  WBC 10.3 10.7* 14.0* 14.2* 12.2*  HGB 8.8* 8.3* 8.2* 7.3* 7.6*  HCT 26.8* 25.4* 25.0* 22.8* 23.1*  PLT 320 333 357 343 284  MCV 99.3 99.2 100.0 101.3* 95.5  MCH 32.6 32.4 32.8 32.4 31.4  MCHC 32.8 32.7 32.8 32.0 32.9  RDW 14.6 14.9 15.1 15.2 16.9*    Recent Labs  Lab 12/14/21 0609 12/15/21 0241 12/16/21 0440 12/17/21 0556 12/18/21 0544 12/19/21 0454 12/20/21 0318  NA 138  --   --  136  --  134*  --   K  3.7  --   --  3.7  --  4.3  --   CL 107  --   --  105  --  102  --   CO2 26  --   --  24  --  22  --   GLUCOSE 100*  --   --  93  --  119*  --   BUN 19  --   --  19  --  26*  --   CREATININE 0.79  --   --  0.84  --  0.86  --   CALCIUM 8.2*  --   --  8.3*  --  8.1*  --   INR 1.5*   < > 1.2 1.4* 1.6* 2.5* 2.8*   < > = values in this interval not displayed.    RADIOLOGY STUDIES/RESULTS: No results found.   LOS: 11 days   Signature  Phillips Climes M.D on 12/20/2021 at 10:00 AM   -  To page go to [email or web address]

## 2021-12-20 NOTE — Progress Notes (Signed)
Occupational Therapy Treatment Patient Details Name: Terry [name] MRN: [id number] DOB: 1935/08/21 Today's Date: 12/20/2021   History of present illness 86 y/o male presented to ED on 12/07/21 after fall in parking lot. Had a syncopal event in ED. 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 multiple skin abrasions PMH: mitral valve replacement, HTN   OT comments  Pt progressing towards established OT goals. Pt continues with decreased ROM and strength in BUE. Challenging BUE strength and pt endurance with UE exercise this session, and pt reporting a "good challenge". Pt performing oral care at sink with min guard A. Actively utilizing BUE, and no physical A needed. Pt with decreased functional grasp due to Bil hand bandaging. Pt continues to present with decreased strength, ROM, balance, and endurance. Continue to recommend SNF to optimize safety and independence in ADL and IADL.   Recommendations for follow up therapy are one component of a multi-disciplinary discharge planning process, led by the attending physician.  Recommendations may be updated based on patient status, additional functional criteria and insurance authorization.    Follow Up Recommendations  Skilled nursing-short term rehab (<3 hours/day)    Assistance Recommended at Discharge Intermittent Supervision/Assistance  Patient can return home with the following  A little help with walking and/or transfers;Assistance with cooking/housework;Direct supervision/assist for medications management;Direct supervision/assist for financial management;Assist for transportation;Help with stairs or ramp for entrance;A lot of help with bathing/dressing/bathroom   Equipment Recommendations  None recommended by OT (Pt has all needed DME)    Recommendations for Other Services      Precautions / Restrictions Precautions Precautions: Fall Precaution Comments: watch BP Restrictions Weight Bearing Restrictions: No        Mobility Bed Mobility               General bed mobility comments: Up in chair on arrival and departure    Transfers Overall transfer level: Needs assistance Equipment used: Rolling walker (2 wheels) Transfers: Sit to/from Stand Sit to Stand: Min assist           General transfer comment: Min A for steadying. Pt reporting hip pain. Offered to request medication from RN, and pt denying and stating feels okay to ambulate     Balance Overall balance assessment: Needs assistance Sitting-balance support: Feet supported, No upper extremity supported Sitting balance-Leahy Scale: Good     Standing balance support: Bilateral upper extremity supported, During functional activity, Reliant on assistive device for balance Standing balance-Leahy Scale: Poor Standing balance comment: needing at least 1 UE support                           ADL either performed or assessed with clinical judgement   ADL Overall ADL's : Needs assistance/impaired     Grooming: Min guard;Standing;Oral care Grooming Details (indicate cue type and reason): oral care in standing; min guard A for safety. No physical assist needed.                 Toilet Transfer: Min guard;Comfort height toilet;Ambulation;Rolling walker (2 wheels) Toilet Transfer Details (indicate cue type and reason): simulated in room         Functional mobility during ADLs: Min guard;Rolling walker (2 wheels)      Extremity/Trunk Assessment Upper Extremity Assessment Upper Extremity Assessment: Generalized weakness   Lower Extremity Assessment Lower Extremity Assessment: Defer to PT evaluation        Vision   Vision Assessment?: No apparent visual deficits  Additional Comments: WFL for tasks assessed   Perception Perception Perception: Not tested   Praxis Praxis Praxis: Not tested    Cognition Arousal/Alertness: Awake/alert Behavior During Therapy: WFL for tasks assessed/performed Overall  Cognitive Status: Within Functional Limits for tasks assessed                                 General Comments: Pt with slightly decreased processing speed, reporting that he feels a bit off from his baseline. However, cognition WFL for tasks assessed.        Exercises Exercises: Other exercises Other Exercises Other Exercises: hip extension standing x 10 Other Exercises: BUE shoulder flexion 10x    Shoulder Instructions       General Comments      Pertinent Vitals/ Pain       Pain Assessment Pain Assessment: Faces Faces Pain Scale: Hurts even more Pain Location: L hip Pain Descriptors / Indicators: Discomfort, Guarding Pain Intervention(s): Limited activity within patient's tolerance, Monitored during session, Repositioned, Relaxation  Home Living                                          Prior Functioning/Environment              Frequency  Min 2X/week        Progress Toward Goals  OT Goals(current goals can now be found in the care plan section)  Progress towards OT goals: Progressing toward goals  Acute Rehab OT Goals Patient Stated Goal: None stated OT Goal Formulation: With patient Time For Goal Achievement: 12/22/21 Potential to Achieve Goals: Good ADL Goals Pt Will Perform Upper Body Dressing: with modified independence;sitting Pt Will Perform Lower Body Dressing: with modified independence;sitting/lateral leans;sit to/from stand Pt Will Transfer to Toilet: with modified independence;regular height toilet;ambulating Pt Will Perform Tub/Shower Transfer: Shower transfer;shower seat;ambulating;with modified independence Additional ADL Goal #1: pt will complete bed mobility with mod I in prep for ADLs  Plan Discharge plan remains appropriate    Co-evaluation                 AM-PAC OT "6 Clicks" Daily Activity     Outcome Measure   Help from another person eating meals?: A Little Help from another person  taking care of personal grooming?: A Little Help from another person toileting, which includes using toliet, bedpan, or urinal?: A Little Help from another person bathing (including washing, rinsing, drying)?: A Lot Help from another person to put on and taking off regular upper body clothing?: A Little Help from another person to put on and taking off regular lower body clothing?: A Lot 6 Click Score: 16    End of Session Equipment Utilized During Treatment: Gait belt;Rolling walker (2 wheels)  OT Visit Diagnosis: Other abnormalities of gait and mobility (R26.89);Unsteadiness on feet (R26.81);Muscle weakness (generalized) (M62.81)   Activity Tolerance Patient tolerated treatment well   Patient Left in chair;with call bell/phone within reach   Nurse Communication Mobility status        Time: 4268-3419 OT Time Calculation (min): 22 min  Charges: OT General Charges $OT Visit: 1 Visit OT Treatments $Self Care/Home Management : 8-22 mins  Terry [name], OTR/L [facility] Acute Rehabilitation Office: [phone]   Terry [name] 12/20/2021, 5:01 PM

## 2021-12-20 NOTE — Progress Notes (Signed)
Physical Therapy Treatment Patient Details Name: Terry [name] MRN: [id number] DOB: June 03, 1935 Today's Date: 12/20/2021   History of Present Illness 86 y/o male presented to ED on 12/07/21 after fall in parking lot. Had a syncopal event in ED. 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 multiple skin abrasions PMH: mitral valve replacement, HTN    PT Comments    Patient reporting feeling much better today. Denied dizziness when he got up to chair with nursing just a few minutes prior to PT arrival. Able to ambulate 200 ft with RW with min cues for safe walker use. Much less antalgic gait pattern.     Recommendations for follow up therapy are one component of a multi-disciplinary discharge planning process, led by the attending physician.  Recommendations may be updated based on patient status, additional functional criteria and insurance authorization.  Follow Up Recommendations  Skilled nursing-short term rehab (<3 hours/day) Can patient physically be transported by private vehicle: Yes   Assistance Recommended at Discharge Frequent or constant Supervision/Assistance  Patient can return home with the following A little help with walking and/or transfers;A little help with bathing/dressing/bathroom;Assistance with cooking/housework;Assist for transportation;Help with stairs or ramp for entrance   Equipment Recommendations  Rolling walker (2 wheels)    Recommendations for Other Services       Precautions / Restrictions Precautions Precautions: Fall Precaution Comments: watch BP Restrictions Weight Bearing Restrictions: No     Mobility  Bed Mobility               General bed mobility comments: up in chair on arrival    Transfers Overall transfer level: Needs assistance Equipment used: Rolling walker (2 wheels) Transfers: Sit to/from Stand Sit to Stand: Min guard           General transfer comment: increased time to stand due to L hip pain     Ambulation/Gait Ambulation/Gait assistance: Min guard Gait Distance (Feet): 200 Feet Assistive device: Rolling walker (2 wheels) Gait Pattern/deviations: Antalgic, Trunk flexed, Step-through pattern, Decreased stride length Gait velocity: decreased     General Gait Details: initial few steps with antalgic pattern, improved with time/distance; vc for proximity to RW and upright posture   Stairs             Wheelchair Mobility    Modified Rankin (Stroke Patients Only)       Balance Overall balance assessment: Needs assistance Sitting-balance support: Feet supported, No upper extremity supported Sitting balance-Leahy Scale: Good     Standing balance support: Bilateral upper extremity supported, During functional activity, Reliant on assistive device for balance Standing balance-Leahy Scale: Poor Standing balance comment: needing at least 1 UE support                            Cognition Arousal/Alertness: Awake/alert Behavior During Therapy: WFL for tasks assessed/performed Overall Cognitive Status: Within Functional Limits for tasks assessed                                          Exercises General Exercises - Lower Extremity Long Arc Quad: AROM, Left, 10 reps    General Comments        Pertinent Vitals/Pain Pain Assessment Pain Assessment: Faces Faces Pain Scale: Hurts a little bit Pain Location: L hip Pain Descriptors / Indicators: Discomfort, Guarding Pain Intervention(s): Limited activity within patient's  tolerance, Monitored during session    Home Living                          Prior Function            PT Goals (current goals can now be found in the care plan section) Acute Rehab PT Goals Patient Stated 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 Time For Goal Achievement: 12/22/21 Potential to Achieve Goals: Good Progress towards PT goals: Progressing toward goals    Frequency    Min  3X/week      PT Plan Current plan remains appropriate    Co-evaluation              AM-PAC PT "6 Clicks" Mobility   Outcome Measure  Help needed turning from your back to your side while in a flat bed without using bedrails?: A Little Help needed moving from lying on your back to sitting on the side of a flat bed without using bedrails?: A Little Help needed moving to and from a bed to a chair (including a wheelchair)?: A Little Help needed standing up from a chair using your arms (e.g., wheelchair or bedside chair)?: A Little Help needed to walk in hospital room?: A Little Help needed climbing 3-5 steps with a railing? : A Lot 6 Click Score: 17    End of Session Equipment Utilized During Treatment: Gait belt Activity Tolerance: Patient tolerated treatment well Patient left: in chair;with call bell/phone within reach Nurse Communication: Mobility status PT Visit Diagnosis: Muscle weakness (generalized) (M62.81);History of falling (Z91.81);Difficulty in walking, not elsewhere classified (R26.2)     Time: 1010-1020 PT Time Calculation (min) (ACUTE ONLY): 10 min  Charges:  $Gait Training: 8-22 mins                      Arby Barrette, PT [place]  Office [phone]    Rexanne Mano 12/20/2021, 10:29 AM

## 2021-12-20 NOTE — Progress Notes (Signed)
ANTICOAGULATION CONSULT NOTE - Follow Up Consult  Pharmacy Consult for coumadin Indication:  MVR  Labs: Recent Labs    12/18/21 0544 12/18/21 1808 12/19/21 0454 12/20/21 0318  HGB 8.3* 8.2* 7.3* 7.6*  HCT 25.4* 25.0* 22.8* 23.1*  PLT 333 357 343 284  LABPROT 19.1*  --  26.6* 29.4*  INR 1.6*  --  2.5* 2.8*  HEPARINUNFRC 0.28*  --  0.32  --   CREATININE  --   --  0.86  --      Assessment: 86yo male presenting 7/28 s/p syncope/fall, continuing on heparin for hx mechanical MVR. Warfarin PTA was held on 8/1 with intermittent bleeding from wounds. Now Pharmacy consulted to resume with heparin bridge. INR 3.5 on admission.   Heparin level is subtherapeutic at 0.28 on 1050 units/hr. INR is subtherapeutic at 1.6. No bleeding noted, Hgb stable in 8s, platelets are normal.  Warfarin PTA dose - '5mg'$  daily except 2.'5mg'$  on Mon per last anticoagulation outpatient clinic note (but '5mg'$  daily except 2.'5mg'$  on Mon and Fri per med rec)  There was some oozing from wound this PM. Heparin was held for a bit. It has gotten better. Ok to resume heparin at [place] per Dr. Candiss Norse.  12/19/2021 HL 0.32 / INR 2.5 Patient bled for 3 hours from his hand on overnight shift. Provider was contacted by night RN and instructions were given to stop the heparin infusion. Spoke with Dr. Waldron Labs this morning. He would like heparin discontinued and warfarin continued only at this point in time.   12/20/2021 INR 2.8  Spoke with nurse. He states there wqas no bleeding noted overnight. He also stated the dressing change is being held until Friday (12/21/2021) which will help reduce the oozing from his wound.   Goal of Therapy:  INR 2.5-3.5 Monitor platelets by anticoagulation protocol: Yes  Plan:  Restart home dose of Warfarin '5mg'$  PO daily except on Sunday and Friday  Warfarin 2.5 mg on Sunday and Friday  Monitor daily CBC/INR, s/sx bleeding  Vaughan Basta BS, PharmD, BCPS Clinical Pharmacist 12/20/2021 8:20  AM  Contact: [phone] after 3 PM  "Be curious, not judgmental..." -Jamal Maes

## 2021-12-20 NOTE — Progress Notes (Signed)
OT Cancellation Note  Patient Details Name: Terry [name] MRN: [id number] DOB: February 25, 1936   Cancelled Treatment:    Reason Eval/Treat Not Completed: Other (comment) (RN requesting to wait1+ hour, as pt is receiving blood right now.) Will return as schedule allows   Shanda Howells, OTR/L [facility] Acute Rehabilitation Office: [phone]   [name] 12/20/2021, 12:03 PM

## 2021-12-20 NOTE — Progress Notes (Signed)
[facility] night cross cover note:   I was notified by RN of the patient's request for resumption of home omeprazole, which she takes on a every 48 hour basis.  Additionally, the patient is complaining of constipation, noting most recent bowel movement 3 days ago, with associated request for initiation of oral bowel regimen.  I subsequently placed order for resumption of home PPI on a Q48-hour frequency, and placed order for prn MiraLAX.    Babs Bertin, DO Hospitalist

## 2021-12-21 DIAGNOSIS — R55 Syncope and collapse: Secondary | ICD-10-CM | POA: Diagnosis not present

## 2021-12-21 DIAGNOSIS — D62 Acute posthemorrhagic anemia: Secondary | ICD-10-CM | POA: Diagnosis not present

## 2021-12-21 LAB — BPAM RBC
Blood Product Expiration Date: 202308282359
Blood Product Expiration Date: 202309052359
ISSUE DATE / TIME: 202308091249
ISSUE DATE / TIME: 202308101040
Unit Type and Rh: 6200
Unit Type and Rh: 6200

## 2021-12-21 LAB — PROTIME-INR
INR: 2.8 — ABNORMAL HIGH (ref 0.8–1.2)
Prothrombin Time: 29.6 seconds — ABNORMAL HIGH (ref 11.4–15.2)

## 2021-12-21 LAB — CBC
HCT: 27.6 % — ABNORMAL LOW (ref 39.0–52.0)
Hemoglobin: 9 g/dL — ABNORMAL LOW (ref 13.0–17.0)
MCH: 31 pg (ref 26.0–34.0)
MCHC: 32.6 g/dL (ref 30.0–36.0)
MCV: 95.2 fL (ref 80.0–100.0)
Platelets: 320 10*3/uL (ref 150–400)
RBC: 2.9 MIL/uL — ABNORMAL LOW (ref 4.22–5.81)
RDW: 17 % — ABNORMAL HIGH (ref 11.5–15.5)
WBC: 11.1 10*3/uL — ABNORMAL HIGH (ref 4.0–10.5)
nRBC: 0 % (ref 0.0–0.2)

## 2021-12-21 LAB — TYPE AND SCREEN
ABO/RH(D): A POS
Antibody Screen: NEGATIVE
Unit division: 0
Unit division: 0

## 2021-12-21 NOTE — Progress Notes (Signed)
PROGRESS NOTE        PATIENT DETAILS Name: Terry [name] Age: 86 y.o. Sex: male Date of Birth: [birth year] Admit Date: 12/07/2021 Admitting Physician Terry Desanctis, DO RCV:ELFYBO, Terry [place], MD  Brief Summary:  Patient is a 86 y.o.  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  See below for further details.  He has now stabilized currently on heparin and Coumadin overlap waiting for INR to reach 2.5.   Significant events: 7/28>> fall at [facility] tears on bilateral arms/face-s/p suturing/combat gauze/pressure-subsequently when attempting to ambulate-he syncopized.  Admit to [facility]. 7/29>> orthostatic vital signs positive  Significant studies: 7/28>> x-ray left forearm: No fracture/dislocation.  Soft tissue swelling over the dorsum. 7/28>> x-ray right hand: No fracture. 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 7/28>> CT maxillofacial: Left periorbital soft tissue swelling without acute fracture.  7/29 >>  MRI - 1. No acute intracranial abnormality. 2. Small left periorbital/facial contusion. 3. Chronic encephalomalacia of with hemosiderin staining at the right occipital lobe, consistent with remote hemorrhagic infarct and/or bleed at this location. 4. Generalized age-related cerebral atrophy with chronic small vessel ischemic disease, with a few scattered remote lacunar infarcts involving the pons/midbrain and cerebellum. Overall, changes are progressed as compared to 2017. 5. Multiple scattered chronic micro hemorrhages involving both cerebral and cerebellar hemispheres. Findings are  nonspecific, and could be related to chronic underlying hypertension or possibly cerebral amyloid angiopathy  7/29 >> TTE -  1. Study is not well visualized. Left ventricular ejection fraction, by estimation, is 60 to 65%. The left ventricle has normal function. The left ventricle has no regional wall motion abnormalities. Left ventricular diastolic parameters are consistent with Grade I diastolic dysfunction (impaired relaxation).  2. Right ventricular systolic function is normal. The right ventricular size is not well visualized. Tricuspid regurgitation signal is inadequate for assessing PA pressure.  3. S/p mechanical MVR 2003. MV mean gradient 4 mmHg at HR 92 bpm. No significant stenosis; normal prosthesis. No evidence of mitral valve regurgitation.  4. The aortic valve was not well visualized. Aortic valve regurgitation is not visualized. No aortic stenosis is present.  5. The inferior vena cava is normal in size with greater than 50% respiratory variability, suggesting right atrial pressure of 3 mmHg  Significant microbiology data: None  Procedures: None  Consults:  None  Subjective:   He reports his dizziness and lightheadedness much improved, he still reports generalized weakness and fatigue  Objective: Vitals: Blood pressure 112/61, pulse 96, temperature 98.3 F (36.8 C), temperature source Oral, resp. rate 18, height '5\' 11"'$  (1.803 m), weight 74.8 kg, SpO2 97 %.   Exam:  Alert awake-not in any distress.  Left periorbital ecchymosis present with bandage.  Numerous bandages present-on both arms, large left hip hematoma  Symmetrical Chest wall movement, Good air movement bilaterally, CTAB RRR,No Gallops,Rubs or new Murmurs, No Parasternal Heave +ve B.Sounds, Abd Soft, No tenderness, No rebound - guarding or rigidity. No Cyanosis, Clubbing or edema, No new Rash or bruise  , wearing compression stocking bilaterally  Assessment/Plan:  Syncope:  - Likely orthostatic  hypotension-sustained a fall at home and incurred severe left periorbital bruise and laceration, multiple wounds in upper extremities requiring bandages, lives by himself, also on Coumadin with high INR.  Overall weak and frail do not think he is safe for discharge by himself to home at this point, continue PT OT, advance activity, echo, MRI nonacute.  Orthostatics have improved, stable on telemetry, -No further dizziness, lightheadedness after blood transfusion and stable hemoglobin and compression stockings.  Mechanical fall with soft tissue injury involving left pelvic periorbital area/bilateral upper extremities with bleeding and acute blood loss anemia: Supportive care-mobilize with PT/OT.  Per patient he is updated with his tetanus vaccine.  Note-he required suture to his left forearm small arterial bleeding.  He is still bleeding intermittently during dressing changes and H&H is now gradually falling, will stop his Coumadin on 12/11/2021 currently on heparin drip.  Transfused 1 unit of packed RBC on 12/12/2021 and continue to monitor H&H along with INR/PTT.  H&H remained stable over 48 hours, dressing change on 12/14/2021 did not reveal any fresh bleeding, commands Coumadin with caution on 12/15/2021.   -Globin stable today at 9.5 . -As of another 48 hours on therapeutic INR to ensure stable hemoglobin and no further oozing from his multiple wounds .   Mechanical mitral valve replacement: On Coumadin, Goal INR 2.5-3.5, plz see anticoagulation discussion above, now INR therapeutic, warfarin has been discontinued.  Low-density changes seen on occipital lobe on CT imaging: MRI brain nonacute.  HTN: Blood pressure is improved low-dose beta-blocker added.  Chronic anemia Acute on chronic blood loss anemia -Received another unit PRBC yesterday, hemoglobin is stable today, continue to monitor closely.    HLD: On statin  BMI: Estimated body mass index is 23.01 kg/m as calculated from the following:    Height as of this encounter: '5\' 11"'$  (1.803 m).   Weight as of this encounter: 74.8 kg.   Code status:   Code Status: DNR   DVT Prophylaxis:Place TED hose Start: 12/09/21 0849 Place TED hose Start: 12/08/21 1008 warfarin (COUMADIN) tablet 5 mg  warfarin (COUMADIN) tablet 2.5 mg    Family Communication: None at bedside   Disposition Plan: SNF once H&H and INR stabilized   Diet: Diet Order             Diet regular Room service appropriate? Yes; Fluid consistency: Thin  Diet effective now                   MEDICATIONS: Scheduled Meds:  ferrous sulfate  325 mg Oral BID WC   folic acid  1 mg Oral Daily   metoprolol tartrate  25 mg Oral BID   pantoprazole  40 mg Oral QODAY   rosuvastatin  20 mg Oral Daily   warfarin  2.5 mg Oral Once per day on Sun Fri   warfarin  5 mg Oral Once per day on Mon Tue Wed Thu Sat   Warfarin - Pharmacist Dosing Inpatient   Does not apply q1600   Continuous Infusions:    PRN Meds:.   I have personally reviewed following labs and imaging studies  LABORATORY DATA:  Recent Labs  Lab 12/18/21 0544 12/18/21 1808 12/19/21 0454 12/20/21 0318 12/21/21 0224  WBC 10.7* 14.0* 14.2* 12.2* 11.1*  HGB 8.3* 8.2* 7.3* 7.6* 9.0*  HCT 25.4* 25.0* 22.8* 23.1* 27.6*  PLT 333 357 343 284 320  MCV 99.2 100.0 101.3* 95.5 95.2  MCH 32.4 32.8 32.4  31.4 31.0  MCHC 32.7 32.8 32.0 32.9 32.6  RDW 14.9 15.1 15.2 16.9* 17.0*    Recent Labs  Lab 12/17/21 0556 12/18/21 0544 12/19/21 0454 12/20/21 0318 12/21/21 0224  NA 136  --  134*  --   --   K 3.7  --  4.3  --   --   CL 105  --  102  --   --   CO2 24  --  22  --   --   GLUCOSE 93  --  119*  --   --   BUN 19  --  26*  --   --   CREATININE 0.84  --  0.86  --   --   CALCIUM 8.3*  --  8.1*  --   --   INR 1.4* 1.6* 2.5* 2.8* 2.8*    RADIOLOGY STUDIES/RESULTS: No results found.   LOS: 12 days   Signature  Phillips Climes M.D on 12/21/2021 at 1:40 PM   -  To page go to [email or web address]

## 2021-12-21 NOTE — Progress Notes (Signed)
ANTICOAGULATION CONSULT NOTE - Follow Up Consult  Pharmacy Consult for coumadin Indication:  MVR  Labs: Recent Labs    12/19/21 0454 12/20/21 0318 12/21/21 0224  HGB 7.3* 7.6* 9.0*  HCT 22.8* 23.1* 27.6*  PLT 343 284 320  LABPROT 26.6* 29.4* 29.6*  INR 2.5* 2.8* 2.8*  HEPARINUNFRC 0.32  --   --   CREATININE 0.86  --   --      Assessment: 86yo male presenting 7/28 s/p syncope/fall, continuing on heparin for hx mechanical MVR. Warfarin PTA was held on 8/1 with intermittent bleeding from wounds. Now Pharmacy consulted to resume with heparin bridge. INR 3.5 on admission.   Heparin level is subtherapeutic at 0.28 on 1050 units/hr. INR is subtherapeutic at 1.6. No bleeding noted, Hgb stable in 8s, platelets are normal.  Warfarin PTA dose - '5mg'$  daily except 2.'5mg'$  on Mon per last anticoagulation outpatient clinic note (but '5mg'$  daily except 2.'5mg'$  on Mon and Fri per med rec)  There was some oozing from wound this PM. Heparin was held for a bit. It has gotten better. Ok to resume heparin at [place] per Dr. Candiss Norse.  12/19/2021 HL 0.32 / INR 2.5 Patient bled for 3 hours from his hand on overnight shift. Provider was contacted by night RN and instructions were given to stop the heparin infusion. Spoke with Dr. Waldron Labs this morning. He would like heparin discontinued and warfarin continued only at this point in time.   12/20/2021 INR 2.8  Spoke with nurse. He states there was no bleeding noted overnight. He also stated the dressing change is being held until Friday (12/21/2021) which will help reduce the oozing from his wound.   12/21/2021 INR 2.8  Goal of Therapy:  INR 2.5-3.5 Monitor platelets by anticoagulation protocol: Yes  Plan:  Restart home dose of Warfarin '5mg'$  PO daily except on Sunday and Friday  Warfarin 2.5 mg on Sunday and Friday  Monitor daily CBC/INR, s/sx bleeding  Vaughan Basta BS, PharmD, BCPS Clinical Pharmacist 12/21/2021 7:31 AM  Contact: [phone] after 3  PM  "Be curious, not judgmental..." -Jamal Maes

## 2021-12-21 NOTE — TOC Progression Note (Addendum)
Transition of Care [facility]) - Progression Note    Patient Details  Name: Terry [name] MRN: [id number] Date of Birth: 1935/06/27  [facility]) CM/SW [place], [place] Phone Number: 12/21/2021, 11:40 AM  Clinical Narrative:   CSW updated by MD that patient should be ready for discharge Monday. CSW confirmed with [facility] that patient would have a bed, and requested insurance authorization. CSW to follow.  UPDATE: Authorization received for possible transition to SNF on Monday, pending medical stability.    Expected Discharge Plan: [place] Barriers to Discharge: Continued Medical Work up, Ship broker  Expected Discharge Plan and Services Expected Discharge Plan: [place] Choice: [place] arrangements for the past 2 months: Single Family Home                                       Social Determinants of Health (SDOH) Interventions    Readmission Risk Interventions     No data to display

## 2021-12-22 DIAGNOSIS — S41102A Unspecified open wound of left upper arm, initial encounter: Secondary | ICD-10-CM | POA: Diagnosis not present

## 2021-12-22 DIAGNOSIS — R55 Syncope and collapse: Secondary | ICD-10-CM | POA: Diagnosis not present

## 2021-12-22 LAB — CBC
HCT: 27.5 % — ABNORMAL LOW (ref 39.0–52.0)
Hemoglobin: 9.1 g/dL — ABNORMAL LOW (ref 13.0–17.0)
MCH: 31.4 pg (ref 26.0–34.0)
MCHC: 33.1 g/dL (ref 30.0–36.0)
MCV: 94.8 fL (ref 80.0–100.0)
Platelets: 310 10*3/uL (ref 150–400)
RBC: 2.9 MIL/uL — ABNORMAL LOW (ref 4.22–5.81)
RDW: 16.5 % — ABNORMAL HIGH (ref 11.5–15.5)
WBC: 9.7 10*3/uL (ref 4.0–10.5)
nRBC: 0 % (ref 0.0–0.2)

## 2021-12-22 LAB — PROTIME-INR
INR: 2.9 — ABNORMAL HIGH (ref 0.8–1.2)
Prothrombin Time: 29.8 seconds — ABNORMAL HIGH (ref 11.4–15.2)

## 2021-12-22 NOTE — Progress Notes (Signed)
ANTICOAGULATION CONSULT NOTE - Follow Up Consult  Pharmacy Consult for Warfarin Indication:  MVR  Allergies  Allergen Reactions   Cipro [Ciprofloxacin Hcl] Other (See Comments)    Heartburn    Sulfa Drugs Cross Reactors Hives    Patient Measurements: Height: '5\' 11"'$  (180.3 cm) Weight: 74.8 kg (165 lb) IBW/kg (Calculated) : 75.3 kg  Vital Signs: Temp: 98 F (36.7 C) (08/12 0700) Temp Source: Oral (08/12 0700) BP: 114/63 (08/12 0700) Pulse Rate: 94 (08/12 0700)  Labs: Recent Labs    12/20/21 0318 12/21/21 0224 12/22/21 0230  HGB 7.6* 9.0* 9.1*  HCT 23.1* 27.6* 27.5*  PLT 284 320 310  LABPROT 29.4* 29.6* 29.8*  INR 2.8* 2.8* 2.9*    Estimated Creatinine Clearance: 67.6 mL/min (by C-G formula based on SCr of 0.86 mg/dL).  Assessment: 86 yo male presents with a history of mechanical MVR. PTA the patient is on warfarin, INR 3.5 upon admission. Anticoagulation was held due to intermittent bleeding from wounds. The patient was bridged with heparin and is now only on warfarin. Pharmacy is consulted to dose warfarin.  Warfarin Home Regimen: - Warfarin 5 mg daily except 2.5 mg on Monday and Friday per medication history (anticoagulation clinic note on 10/23/21 states warfarin 5 mg daily except 2.5 mg on Monday)    INR is therapeutic at 2.9. Hgb 9.1, platelets 310. No signs of bleeding have been  documented. No new interacting medications have been initiated and there have  been no significant diet changes.   Goal of Therapy:   INR 2.5-3.5  Monitor platelets by anticoagulation protocol: Yes   Plan:   Continue warfarin PO 5 mg daily except 2.5 mg on Sunday and Friday  Obtain a daily PT/INR and CBC  Monitor for signs and symptoms of bleeding  Watch for drug-drug interactions and significant diet changes  Shauna Hugh, PharmD, MS 12/22/2021 7:45 AM  Please check [email or web address] for unit-specific pharmacy phone numbers.

## 2021-12-22 NOTE — Progress Notes (Signed)
PROGRESS NOTE        PATIENT DETAILS Name: Terry [name] Age: 86 y.o. Sex: male Date of Birth: [birth year] Admit Date: 12/07/2021 Admitting Physician Orene Desanctis, DO AVW:[id number], [name], MD  Brief Summary:  Patient is a 86 y.o.  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  See below for further details.  He has now stabilized currently on heparin and Coumadin overlap waiting for INR to reach 2.5.   Significant events: 7/28>> fall at [facility] tears on bilateral arms/face-s/p suturing/combat gauze/pressure-subsequently when attempting to ambulate-he syncopized.  Admit to [facility]. 7/29>> orthostatic vital signs positive  Significant studies: 7/28>> x-ray left forearm: No fracture/dislocation.  Soft tissue swelling over the dorsum. 7/28>> x-ray right hand: No fracture. 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 7/28>> CT maxillofacial: Left periorbital soft tissue swelling without acute fracture.  7/29 >>  MRI - 1. No acute intracranial abnormality. 2. Small left periorbital/facial contusion. 3. Chronic encephalomalacia of with hemosiderin staining at the right occipital lobe, consistent with remote hemorrhagic infarct and/or bleed at this location. 4. Generalized age-related cerebral atrophy with chronic small vessel ischemic disease, with a few scattered remote lacunar infarcts involving the pons/midbrain and cerebellum. Overall, changes are progressed as compared to 2017. 5. Multiple scattered chronic micro hemorrhages involving both cerebral and cerebellar hemispheres. Findings are  nonspecific, and could be related to chronic underlying hypertension or possibly cerebral amyloid angiopathy  7/29 >> TTE -  1. Study is not well visualized. Left ventricular ejection fraction, by estimation, is 60 to 65%. The left ventricle has normal function. The left ventricle has no regional wall motion abnormalities. Left ventricular diastolic parameters are consistent with Grade I diastolic dysfunction (impaired relaxation).  2. Right ventricular systolic function is normal. The right ventricular size is not well visualized. Tricuspid regurgitation signal is inadequate for assessing PA pressure.  3. S/p mechanical MVR 2003. MV mean gradient 4 mmHg at HR 92 bpm. No significant stenosis; normal prosthesis. No evidence of mitral valve regurgitation.  4. The aortic valve was not well visualized. Aortic valve regurgitation is not visualized. No aortic stenosis is present.  5. The inferior vena cava is normal in size with greater than 50% respiratory variability, suggesting right atrial pressure of 3 mmHg  Significant microbiology data: None  Procedures: None  Consults:  None  Subjective:   Does report some restless legs overnight caused him to have poor sleep  Objective: Vitals: Blood pressure 114/63, pulse 94, temperature 98 F (36.7 C), temperature source Oral, resp. rate 16, height '5\' 11"'$  (1.803 m), weight 74.8 kg, SpO2 98 %.   Exam:  Alert awake-not in any distress.  Significant bruising/hematomas due to his fall, periorbital bruising, left arm and right hand are bandaged Symmetrical Chest wall movement, Good air movement bilaterally, CTAB RRR,No Gallops,Rubs or new Murmurs, No Parasternal Heave +ve B.Sounds, Abd Soft, No tenderness, No rebound - guarding or rigidity. No Cyanosis, Clubbing or edema, No new Rash or bruise        Assessment/Plan:  Syncope:  -  Likely orthostatic hypotension-sustained a fall at home and incurred severe left periorbital bruise and laceration,  multiple wounds in upper extremities requiring bandages, lives by himself, also on Coumadin with high INR.  Overall weak and frail do not think he is safe for discharge by himself to home at this point, continue PT OT, advance activity, echo, MRI nonacute.  Orthostatics have improved, stable on telemetry, -No further dizziness, lightheadedness after blood transfusion and stable hemoglobin and compression stockings.  Mechanical fall with soft tissue injury involving left pelvic periorbital area/bilateral upper extremities with bleeding and acute blood loss anemia: Supportive care-mobilize with PT/OT.  Per patient he is updated with his tetanus vaccine.  Note-he required suture to his left forearm small arterial bleeding.  He is still bleeding intermittently during dressing changes and H&H is now gradually falling, will stop his Coumadin on 12/11/2021 currently on heparin drip.  Transfused 1 unit of packed RBC on 12/12/2021 and continue to monitor H&H along with INR/PTT.  H&H remained stable over 48 hours, dressing change on 12/14/2021 did not reveal any fresh bleeding, commands Coumadin with caution on 12/15/2021.   -Hgb remained stable, continue to monitor for another 24 to 48 hours within therapeutic INR, and ensure further bleeding from his wounds before discharge.  Mechanical mitral valve replacement: On Coumadin, Goal INR 2.5-3.5, plz see anticoagulation discussion above, now INR therapeutic, warfarin has been discontinued.  Low-density changes seen on occipital lobe on CT imaging: MRI brain nonacute.  HTN: Blood pressure is improved low-dose beta-blocker added.  Chronic anemia Acute on chronic blood loss anemia -Received another unit PRBC yesterday, hemoglobin is stable today, continue to monitor closely.    HLD: On statin  BMI: Estimated body mass index is 23.01 kg/m as calculated from the following:   Height as of this encounter: '5\' 11"'$  (1.803 m).   Weight as of this encounter: 74.8 kg.   Code  status:   Code Status: DNR   DVT Prophylaxis:Place TED hose Start: 12/09/21 0849 Place TED hose Start: 12/08/21 1008 warfarin (COUMADIN) tablet 5 mg  warfarin (COUMADIN) tablet 2.5 mg    Family Communication: None at bedside   Disposition Plan: SNF once H&H and INR stabilized   Diet: Diet Order             Diet regular Room service appropriate? Yes; Fluid consistency: Thin  Diet effective now                   MEDICATIONS: Scheduled Meds:  ferrous sulfate  325 mg Oral BID WC   folic acid  1 mg Oral Daily   metoprolol tartrate  25 mg Oral BID   pantoprazole  40 mg Oral QODAY   rosuvastatin  20 mg Oral Daily   warfarin  2.5 mg Oral Once per day on Sun Fri   warfarin  5 mg Oral Once per day on Mon Tue Wed Thu Sat   Warfarin - Pharmacist Dosing Inpatient   Does not apply q1600   Continuous Infusions:    PRN Meds:.   I have personally reviewed following labs and imaging studies  LABORATORY DATA:  Recent Labs  Lab 12/18/21 1808 12/19/21 0454 12/20/21 0318 12/21/21 0224 12/22/21 0230  WBC 14.0* 14.2* 12.2* 11.1* 9.7  HGB 8.2* 7.3* 7.6* 9.0* 9.1*  HCT 25.0* 22.8* 23.1* 27.6* 27.5*  PLT 357 343 284 320 310  MCV 100.0 101.3* 95.5 95.2 94.8  MCH 32.8 32.4 31.4 31.0 31.4  MCHC 32.8 32.0 32.9 32.6 33.1  RDW 15.1 15.2 16.9* 17.0* 16.5*    Recent Labs  Lab 12/17/21 0556 12/18/21 0544 12/19/21 0454 12/20/21 0318 12/21/21 0224 12/22/21 0230  NA 136  --  134*  --   --   --   K 3.7  --  4.3  --   --   --   CL 105  --  102  --   --   --   CO2 24  --  22  --   --   --   GLUCOSE 93  --  119*  --   --   --   BUN 19  --  26*  --   --   --   CREATININE 0.84  --  0.86  --   --   --   CALCIUM 8.3*  --  8.1*  --   --   --   INR 1.4* 1.6* 2.5* 2.8* 2.8* 2.9*    RADIOLOGY STUDIES/RESULTS: No results found.   LOS: 13 days   Signature  Phillips Climes M.D on 12/22/2021 at 11:32 AM   -  To page go to [email or web address]

## 2021-12-23 DIAGNOSIS — R55 Syncope and collapse: Secondary | ICD-10-CM | POA: Diagnosis not present

## 2021-12-23 DIAGNOSIS — D62 Acute posthemorrhagic anemia: Secondary | ICD-10-CM | POA: Diagnosis not present

## 2021-12-23 DIAGNOSIS — Z952 Presence of prosthetic heart valve: Secondary | ICD-10-CM | POA: Diagnosis not present

## 2021-12-23 LAB — CBC
HCT: 28.1 % — ABNORMAL LOW (ref 39.0–52.0)
HCT: 30.9 % — ABNORMAL LOW (ref 39.0–52.0)
Hemoglobin: 9.2 g/dL — ABNORMAL LOW (ref 13.0–17.0)
Hemoglobin: 10.3 g/dL — ABNORMAL LOW (ref 13.0–17.0)
MCH: 31.1 pg (ref 26.0–34.0)
MCH: 31.2 pg (ref 26.0–34.0)
MCHC: 32.7 g/dL (ref 30.0–36.0)
MCHC: 33.3 g/dL (ref 30.0–36.0)
MCV: 94.9 fL (ref 80.0–100.0)
MCV: 93.6 fL (ref 80.0–100.0)
Platelets: 348 10*3/uL (ref 150–400)
Platelets: 392 10*3/uL (ref 150–400)
RBC: 2.96 MIL/uL — ABNORMAL LOW (ref 4.22–5.81)
RBC: 3.3 MIL/uL — ABNORMAL LOW (ref 4.22–5.81)
RDW: 16.3 % — ABNORMAL HIGH (ref 11.5–15.5)
RDW: 17.1 % — ABNORMAL HIGH (ref 11.5–15.5)
WBC: 10.1 10*3/uL (ref 4.0–10.5)
WBC: 11.2 10*3/uL — ABNORMAL HIGH (ref 4.0–10.5)
nRBC: 0 % (ref 0.0–0.2)
nRBC: 0 % (ref 0.0–0.2)

## 2021-12-23 LAB — PREPARE RBC (CROSSMATCH)

## 2021-12-23 LAB — PROTIME-INR
INR: 2.8 — ABNORMAL HIGH (ref 0.8–1.2)
Prothrombin Time: 29.5 seconds — ABNORMAL HIGH (ref 11.4–15.2)

## 2021-12-23 MED ORDER — THROMBIN 5000 UNITS EX SOLR
5000.0000 [IU] | Freq: Once | CUTANEOUS | Status: AC
  Administered 2021-12-23: 5000 [IU] via TOPICAL
  Filled 2021-12-23: qty 5000

## 2021-12-23 MED ORDER — THROMBIN 5000 UNITS EX SOLR
5000.0000 [IU] | Freq: Once | CUTANEOUS | Status: DC
Start: 2021-12-23 — End: 2021-12-24
  Filled 2021-12-23 (×3): qty 5000

## 2021-12-23 MED ORDER — SODIUM CHLORIDE 0.9% IV SOLUTION: Freq: Once | INTRAVENOUS | Status: DC

## 2021-12-23 MED ORDER — "THROMBI-PAD 3""X3"" EX PADS"
1.0000 | MEDICATED_PAD | Freq: Once | CUTANEOUS | Status: AC
  Administered 2021-12-23: 1 via TOPICAL
  Filled 2021-12-23 (×2): qty 1

## 2021-12-23 MED ORDER — ALUM & MAG HYDROXIDE-SIMETH 200-200-20 MG/5ML PO SUSP
30.0000 mL | Freq: Four times a day (QID) | ORAL | Status: DC | PRN
  Administered 2021-12-23: 30 mL via ORAL
  Filled 2021-12-23: qty 30

## 2021-12-23 NOTE — Progress Notes (Signed)
Called to room by patient to find right hand oozing a significant amount of blood. Pressure held, MD made aware and at bedside. Redressed wound with thrombin pad and bleeding stopped. VSS. See new orders.

## 2021-12-23 NOTE — Progress Notes (Signed)
PROGRESS NOTE        PATIENT DETAILS Name: Terry [name] Age: 86 y.o. Sex: male Date of Birth: January 01, 1936 Admit Date: 12/07/2021 Admitting Physician Orene Desanctis, DO DGU:[id number], [name], MD  Brief Summary:  Patient is a 86 y.o.  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  See below for further details.  He has now stabilized currently on heparin and Coumadin overlap waiting for INR to reach 2.5.   Significant events: 7/28>> fall at [facility] tears on bilateral arms/face-s/p suturing/combat gauze/pressure-subsequently when attempting to ambulate-he syncopized.  Admit to [facility]. 7/29>> orthostatic vital signs positive  Significant studies: 7/28>> x-ray left forearm: No fracture/dislocation.  Soft tissue swelling over the dorsum. 7/28>> x-ray right hand: No fracture. 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 7/28>> CT maxillofacial: Left periorbital soft tissue swelling without acute fracture.  7/29 >>  MRI - 1. No acute intracranial abnormality. 2. Small left periorbital/facial contusion. 3. Chronic encephalomalacia of with hemosiderin staining at the right occipital lobe, consistent with remote hemorrhagic infarct and/or bleed at this location. 4. Generalized age-related cerebral atrophy with chronic small vessel ischemic disease, with a few scattered remote lacunar infarcts involving the pons/midbrain and cerebellum. Overall, changes are progressed as compared to 2017. 5. Multiple scattered chronic micro hemorrhages involving both cerebral and cerebellar hemispheres. Findings are  nonspecific, and could be related to chronic underlying hypertension or possibly cerebral amyloid angiopathy  7/29 >> TTE -  1. Study is not well visualized. Left ventricular ejection fraction, by estimation, is 60 to 65%. The left ventricle has normal function. The left ventricle has no regional wall motion abnormalities. Left ventricular diastolic parameters are consistent with Grade I diastolic dysfunction (impaired relaxation).  2. Right ventricular systolic function is normal. The right ventricular size is not well visualized. Tricuspid regurgitation signal is inadequate for assessing PA pressure.  3. S/p mechanical MVR 2003. MV mean gradient 4 mmHg at HR 92 bpm. No significant stenosis; normal prosthesis. No evidence of mitral valve regurgitation.  4. The aortic valve was not well visualized. Aortic valve regurgitation is not visualized. No aortic stenosis is present.  5. The inferior vena cava is normal in size with greater than 50% respiratory variability, suggesting right atrial pressure of 3 mmHg  Significant microbiology data: None  Procedures: None  Consults:  None  Subjective:   Patient did have bleed from right hand, where he required gauze to be changed x2 over last 24 hours  Objective: Vitals: Blood pressure 114/61, pulse 84, temperature 97.9 F (36.6 C), temperature source Oral, resp. rate 18, height '5\' 11"'$  (1.803 m), weight 74.8 kg, SpO2 97 %.   Exam:  Alert awake-not in any distress.  Significant bruising/hematomas due to his fall, periorbital bruising, left arm and right hand are bandaged, some blood can be seen through the gauze of the right hand. Symmetrical Chest wall movement, Good air movement bilaterally, CTAB RRR,No Gallops,Rubs or new Murmurs, No Parasternal Heave +ve B.Sounds, Abd Soft, No tenderness, No rebound - guarding or rigidity. No Cyanosis,  Clubbing or edema, No new Rash or bruise         Assessment/Plan:  Syncope:  - Likely orthostatic  hypotension-sustained a fall at home and incurred severe left periorbital bruise and laceration, multiple wounds in upper extremities requiring bandages, lives by himself, also on Coumadin with high INR.  Overall weak and frail do not think he is safe for discharge by himself to home at this point, continue PT OT, advance activity, echo, MRI nonacute.  Orthostatics have improved, stable on telemetry, -No further dizziness, lightheadedness after blood transfusion and stable hemoglobin and compression stockings.  Mechanical fall with soft tissue injury involving left pelvic periorbital area/bilateral upper extremities with bleeding and acute blood loss anemia:  - Supportive care-mobilize with PT/OT.  Per patient he is updated with his tetanus vaccine.  Note-he required suture to his left forearm small arterial bleeding.  -  He is still bleeding intermittently during dressing changes , . -We will bleeding of the right hand wound required dressing changes x2 over last 24 hours, hemoglobin remained stable, will try to add thrombin locally to his wound dressing to see if it helps.  .  Mechanical mitral valve replacement:  - On Coumadin, Goal INR 2.5-3.5, plz see anticoagulation discussion above, now INR therapeutic, warfarin has been discontinued.  Low-density changes seen on occipital lobe on CT imaging: MRI brain nonacute.  HTN: Blood pressure is improved low-dose beta-blocker added.  Chronic anemia Acute on chronic blood loss anemia -Received another unit PRBC yesterday, hemoglobin is stable today, continue to monitor closely.    HLD: On statin  BMI: Estimated body mass index is 23.01 kg/m as calculated from the following:   Height as of this encounter: '5\' 11"'$  (1.803 m).   Weight as of this encounter: 74.8 kg.   Code status:   Code Status: DNR   DVT Prophylaxis:Place TED hose Start: 12/09/21 0849 Place TED hose Start: 12/08/21 1008 warfarin (COUMADIN) tablet 5 mg  warfarin (COUMADIN)  tablet 2.5 mg    Family Communication: None at bedside   Disposition Plan: SNF once H&H and INR stabilized   Diet: Diet Order             Diet regular Room service appropriate? Yes; Fluid consistency: Thin  Diet effective now                   MEDICATIONS: Scheduled Meds:  ferrous sulfate  325 mg Oral BID WC   folic acid  1 mg Oral Daily   metoprolol tartrate  25 mg Oral BID   pantoprazole  40 mg Oral QODAY   rosuvastatin  20 mg Oral Daily   warfarin  2.5 mg Oral Once per day on Sun Fri   warfarin  5 mg Oral Once per day on Mon Tue Wed Thu Sat   Warfarin - Pharmacist Dosing Inpatient   Does not apply q1600   Continuous Infusions:    PRN Meds:.   I have personally reviewed following labs and imaging studies  LABORATORY DATA:  Recent Labs  Lab 12/19/21 0454 12/20/21 0318 12/21/21 0224 12/22/21 0230 12/23/21 0153  WBC 14.2* 12.2* 11.1* 9.7 10.1  HGB 7.3* 7.6* 9.0* 9.1* 9.2*  HCT 22.8* 23.1* 27.6* 27.5* 28.1*  PLT 343 284 320 310 348  MCV 101.3* 95.5 95.2 94.8 94.9  MCH 32.4 31.4 31.0 31.4 31.1  MCHC 32.0 32.9 32.6 33.1 32.7  RDW 15.2 16.9* 17.0* 16.5* 16.3*    Recent Labs  Lab 12/17/21 0556 12/18/21  9702 12/19/21 0454 12/20/21 0318 12/21/21 0224 12/22/21 0230 12/23/21 0153  NA 136  --  134*  --   --   --   --   K 3.7  --  4.3  --   --   --   --   CL 105  --  102  --   --   --   --   CO2 24  --  22  --   --   --   --   GLUCOSE 93  --  119*  --   --   --   --   BUN 19  --  26*  --   --   --   --   CREATININE 0.84  --  0.86  --   --   --   --   CALCIUM 8.3*  --  8.1*  --   --   --   --   INR 1.4*   < > 2.5* 2.8* 2.8* 2.9* 2.8*   < > = values in this interval not displayed.    RADIOLOGY STUDIES/RESULTS: No results found.   LOS: 14 days   Signature  Phillips Climes M.D on 12/23/2021 at 9:48 AM   -  To page go to [email or web address]

## 2021-12-23 NOTE — Progress Notes (Signed)
ANTICOAGULATION CONSULT NOTE - Follow Up Consult  Pharmacy Consult for Warfarin Indication:  MVR  Allergies  Allergen Reactions   Cipro [Ciprofloxacin Hcl] Other (See Comments)    Heartburn    Sulfa Drugs Cross Reactors Hives    Patient Measurements: Height: '5\' 11"'$  (180.3 cm) Weight: 74.8 kg (165 lb) IBW/kg (Calculated) : 75.3 kg  Vital Signs: Temp: 97.9 F (36.6 C) (08/13 0012) Temp Source: Oral (08/13 0012) BP: 114/61 (08/13 0012) Pulse Rate: 84 (08/13 0012)  Labs: Recent Labs    12/21/21 0224 12/22/21 0230 12/23/21 0153  HGB 9.0* 9.1* 9.2*  HCT 27.6* 27.5* 28.1*  PLT 320 310 348  LABPROT 29.6* 29.8* 29.5*  INR 2.8* 2.9* 2.8*     Estimated Creatinine Clearance: 67.6 mL/min (by C-G formula based on SCr of 0.86 mg/dL).  Assessment: 86 yo male presents with a history of mechanical MVR. PTA the patient is on warfarin, INR 3.5 upon admission. Anticoagulation was held due to intermittent bleeding from wounds. The patient was bridged with heparin and is now only on warfarin. Pharmacy is consulted to dose warfarin.  Warfarin Home Regimen: - Warfarin 5 mg daily except 2.5 mg on Monday and Friday per medication history (anticoagulation clinic note on 10/23/21 states warfarin 5 mg daily except 2.5 mg on Monday)    INR is therapeutic at 2.8 today. Hgb 9.2, platelets stable. No signs of bleeding have been  documented. No new interacting medications have been initiated and there have  been no significant diet changes.   Goal of Therapy:   INR 2.5-3.5  Monitor platelets by anticoagulation protocol: Yes   Plan:   Continue warfarin PO 5 mg daily except 2.5 mg on Sunday and Friday (should receive 2.5 MG today)  Obtain a daily PT/INR and CBC  Monitor for signs and symptoms of bleeding  Watch for drug-drug interactions and significant diet changes  Cephus Slater, PharmD, MBA, MS Pharmacy Clinical Coordinator  [phone] 12/23/2021 7:35 AM   Please check [email or web address] for  unit-specific pharmacy phone numbers.

## 2021-12-24 DIAGNOSIS — R55 Syncope and collapse: Secondary | ICD-10-CM | POA: Diagnosis not present

## 2021-12-24 DIAGNOSIS — D62 Acute posthemorrhagic anemia: Secondary | ICD-10-CM

## 2021-12-24 DIAGNOSIS — I10 Essential (primary) hypertension: Secondary | ICD-10-CM

## 2021-12-24 DIAGNOSIS — Z7901 Long term (current) use of anticoagulants: Secondary | ICD-10-CM | POA: Diagnosis not present

## 2021-12-24 DIAGNOSIS — E785 Hyperlipidemia, unspecified: Secondary | ICD-10-CM

## 2021-12-24 LAB — TYPE AND SCREEN
ABO/RH(D): A POS
Antibody Screen: NEGATIVE
Unit division: 0

## 2021-12-24 LAB — CBC
HCT: 30.4 % — ABNORMAL LOW (ref 39.0–52.0)
Hemoglobin: 9.9 g/dL — ABNORMAL LOW (ref 13.0–17.0)
MCH: 30.9 pg (ref 26.0–34.0)
MCHC: 32.6 g/dL (ref 30.0–36.0)
MCV: 95 fL (ref 80.0–100.0)
Platelets: 378 10*3/uL (ref 150–400)
RBC: 3.2 MIL/uL — ABNORMAL LOW (ref 4.22–5.81)
RDW: 17.1 % — ABNORMAL HIGH (ref 11.5–15.5)
WBC: 8.7 10*3/uL (ref 4.0–10.5)
nRBC: 0 % (ref 0.0–0.2)

## 2021-12-24 LAB — PROTIME-INR
INR: 2.7 — ABNORMAL HIGH (ref 0.8–1.2)
Prothrombin Time: 28.4 seconds — ABNORMAL HIGH (ref 11.4–15.2)

## 2021-12-24 LAB — BPAM RBC
Blood Product Expiration Date: 202308262359
ISSUE DATE / TIME: 202308131547
Unit Type and Rh: 6200

## 2021-12-24 MED ORDER — POLYETHYLENE GLYCOL 3350 17 G PO PACK: 17.0000 g | PACK | Freq: Every day | ORAL | 0 refills | Status: DC | PRN

## 2021-12-24 MED ORDER — WARFARIN SODIUM 2.5 MG PO TABS: 2.5000 mg | ORAL_TABLET | ORAL | Status: DC

## 2021-12-24 MED ORDER — METOPROLOL TARTRATE 25 MG PO TABS: 25.0000 mg | ORAL_TABLET | Freq: Two times a day (BID) | ORAL | Status: DC

## 2021-12-24 MED ORDER — FOLIC ACID 1 MG PO TABS: 1.0000 mg | ORAL_TABLET | Freq: Every day | ORAL | Status: DC

## 2021-12-24 MED ORDER — WARFARIN SODIUM 5 MG PO TABS: 5.0000 mg | ORAL_TABLET | ORAL | Status: DC

## 2021-12-24 MED ORDER — SILVER NITRATE-POT NITRATE 75-25 % EX MISC
1.0000 | CUTANEOUS | 0 refills | Status: DC | PRN
Start: 2021-12-24 — End: 2022-04-12

## 2021-12-24 MED ORDER — ACETAMINOPHEN 500 MG PO TABS: 500.0000 mg | ORAL_TABLET | Freq: Four times a day (QID) | ORAL | 0 refills | Status: DC | PRN

## 2021-12-24 MED ORDER — FERROUS SULFATE 325 (65 FE) MG PO TABS: 325.0000 mg | ORAL_TABLET | Freq: Two times a day (BID) | ORAL | 3 refills | Status: DC

## 2021-12-24 MED ORDER — PANTOPRAZOLE SODIUM 40 MG PO TBEC: 40.0000 mg | DELAYED_RELEASE_TABLET | ORAL | Status: DC

## 2021-12-24 MED ORDER — SENNOSIDES-DOCUSATE SODIUM 8.6-50 MG PO TABS
1.0000 | ORAL_TABLET | Freq: Two times a day (BID) | ORAL | Status: DC
Start: 2021-12-24 — End: 2022-04-12

## 2021-12-24 NOTE — Progress Notes (Signed)
Occupational Therapy Treatment Patient Details Name: Terry [name] MRN: [id number] DOB: [birth year] Today's Date: 12/24/2021   History of present illness 86 y/o male presented to ED on 12/07/21 after fall in parking lot. Had a syncopal event in ED. 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 multiple skin abrasions PMH: mitral valve replacement, HTN   OT comments  Pt progressing towards established OT goals. Pt performing grooming and functional mobility with min guard A this session. Noting decreased dexterity in BUE during oral care. Pt with good safety awareness with use of RW. Pt continues to present with pain and generalized weakness. Discussed discharge plan with pt and niece, and both continue to request SNF to optimize safety and independence in ADL and IADL.    Recommendations for follow up therapy are one component of a multi-disciplinary discharge planning process, led by the attending physician.  Recommendations may be updated based on patient status, additional functional criteria and insurance authorization.    Follow Up Recommendations  Skilled nursing-short term rehab (<3 hours/day)    Assistance Recommended at Discharge Intermittent Supervision/Assistance  Patient can return home with the following  A little help with walking and/or transfers;Assistance with cooking/housework;Direct supervision/assist for medications management;Direct supervision/assist for financial management;Assist for transportation;Help with stairs or ramp for entrance;A lot of help with bathing/dressing/bathroom   Equipment Recommendations  None recommended by OT (Pt has all needed DME)    Recommendations for Other Services      Precautions / Restrictions Precautions Precautions: Fall Restrictions Weight Bearing Restrictions: No       Mobility Bed Mobility                    Transfers Overall transfer level: Needs assistance Equipment used: Rolling walker (2  wheels) Transfers: Sit to/from Stand Sit to Stand: Min guard           General transfer comment: Min guard A for safety. Min cues for hand placemen and speed of descent     Balance Overall balance assessment: Needs assistance Sitting-balance support: Feet supported, No upper extremity supported Sitting balance-Leahy Scale: Good     Standing balance support: Bilateral upper extremity supported, During functional activity, Reliant on assistive device for balance Standing balance-Leahy Scale: Poor Standing balance comment: reliant on RW                           ADL either performed or assessed with clinical judgement   ADL Overall ADL's : Needs assistance/impaired     Grooming: Oral care;Min guard;Standing Grooming Details (indicate cue type and reason): oral care in standing; min guard A for safety. No physical assist needed. Pt reporting pain in RUE where bandages are applied                 Toilet Transfer: Min guard;Comfort height toilet;Ambulation;Rolling walker (2 wheels) Toilet Transfer Details (indicate cue type and reason): Simulated in room         Functional mobility during ADLs: Min guard;Rolling walker (2 wheels) General ADL Comments: Performing in hallway with min guard A. No cues required for safe use of RW this session    Extremity/Trunk Assessment Upper Extremity Assessment Upper Extremity Assessment: Generalized weakness   Lower Extremity Assessment Lower Extremity Assessment: Defer to PT evaluation        Vision   Vision Assessment?: No apparent visual deficits Additional Comments: WFL for tasks assessed   Perception Perception Perception: Not tested  Praxis Praxis Praxis: Not tested    Cognition Arousal/Alertness: Awake/alert Behavior During Therapy: WFL for tasks assessed/performed Overall Cognitive Status: Within Functional Limits for tasks assessed                                 General Comments: Pt  with greater safety awareness with use of RW this session. Pt pleasant and conversational        Exercises      Shoulder Instructions       General Comments VSS niece in room    Pertinent Vitals/ Pain       Pain Assessment Pain Assessment: Faces Faces Pain Scale: Hurts little more Pain Location: L hip, L arm Pain Descriptors / Indicators: Discomfort, Guarding Pain Intervention(s): Limited activity within patient's tolerance, Monitored during session  Home Living                                          Prior Functioning/Environment              Frequency  Min 2X/week        Progress Toward Goals  OT Goals(current goals can now be found in the care plan section)  Progress towards OT goals: Progressing toward goals  Acute Rehab OT Goals Patient Stated Goal: Go to rehab OT Goal Formulation: With patient Time For Goal Achievement: 01/07/22 Potential to Achieve Goals: Good ADL Goals Pt Will Perform Upper Body Dressing: with modified independence;sitting Pt Will Perform Lower Body Dressing: with modified independence;sitting/lateral leans;sit to/from stand Pt Will Transfer to Toilet: with modified independence;regular height toilet;ambulating Pt Will Perform Tub/Shower Transfer: Shower transfer;shower seat;ambulating;with modified independence  Plan Discharge plan remains appropriate    Co-evaluation                 AM-PAC OT "6 Clicks" Daily Activity     Outcome Measure   Help from another person eating meals?: A Little Help from another person taking care of personal grooming?: A Little Help from another person toileting, which includes using toliet, bedpan, or urinal?: A Little Help from another person bathing (including washing, rinsing, drying)?: A Little Help from another person to put on and taking off regular upper body clothing?: A Little Help from another person to put on and taking off regular lower body clothing?: A  Lot 6 Click Score: 17    End of Session Equipment Utilized During Treatment: Gait belt;Rolling walker (2 wheels)  OT Visit Diagnosis: Other abnormalities of gait and mobility (R26.89);Unsteadiness on feet (R26.81);Muscle weakness (generalized) (M62.81)   Activity Tolerance Patient tolerated treatment well   Patient Left in chair;with call bell/phone within reach;with family/visitor present;with chair alarm set   Nurse Communication Mobility status        Time: 5397-6734 OT Time Calculation (min): 17 min  Charges: OT General Charges $OT Visit: 1 Visit OT Treatments $Self Care/Home Management : 8-22 mins  Shanda Howells, OTR/L [facility] Acute Rehabilitation Office: [phone]   [name] 12/24/2021, 2:48 PM

## 2021-12-24 NOTE — Progress Notes (Signed)
Attempted to call report to receiving nurse at [place], [place]. No answer.

## 2021-12-24 NOTE — Progress Notes (Addendum)
ANTICOAGULATION CONSULT NOTE - Follow Up Consult  Pharmacy Consult for Warfarin Indication:  MVR  Allergies  Allergen Reactions   Cipro [Ciprofloxacin Hcl] Other (See Comments)    Heartburn    Sulfa Drugs Cross Reactors Hives    Patient Measurements: Height: '5\' 11"'$  (180.3 cm) Weight: 74.8 kg (165 lb) IBW/kg (Calculated) : 75.3 kg  Vital Signs: Temp: 98 F (36.7 C) (08/14 0801) Temp Source: Axillary (08/14 0801) BP: 105/63 (08/14 0801) Pulse Rate: 101 (08/14 0801)  Labs: Recent Labs    12/22/21 0230 12/23/21 0153 12/23/21 2114 12/24/21 0723  HGB 9.1* 9.2* 10.3* 9.9*  HCT 27.5* 28.1* 30.9* 30.4*  PLT 310 348 392 378  LABPROT 29.8* 29.5*  --  28.4*  INR 2.9* 2.8*  --  2.7*    Estimated Creatinine Clearance: 67.6 mL/min (by C-G formula based on SCr of 0.86 mg/dL).  Assessment: 86 yo male presents with a history of mechanical MVR. PTA the patient is on warfarin, INR 3.5 upon admission. Anticoagulation was held due to intermittent bleeding from wounds. The patient was bridged with heparin and is now only on warfarin. Pharmacy is consulted to dose warfarin.  Warfarin Home Regimen: - Warfarin 5 mg daily except 2.5 mg on Monday and Friday per medication history (anticoagulation clinic note on 10/23/21 states warfarin 5 mg daily except 2.5 mg on Monday)    INR is therapeutic at 2.7 today. Hgb 9.9, platelets stable. No signs of bleeding have been documented. No new interacting medications have been initiated and there have been no significant diet changes.   Goal of Therapy:   INR 2.5-3.5  Monitor platelets by anticoagulation protocol: Yes   Plan:   Continue warfarin PO 5 mg daily except 2.5 mg on Sunday and Friday (should receive 5 MG today)  Obtain a daily PT/INR and CBC  Monitor for signs and symptoms of bleeding  Watch for drug-drug interactions and significant diet changes  Story Student  12/24/2021 09:04 AM    --------------------------------------------------------------------------------------- I discussed / reviewed the pharmacy note by Cristie Hem An Social research officer, government) and I agree with the student's findings and plans as documented.  Vaughan Basta BS, PharmD, BCPS Clinical Pharmacist 12/24/2021 9:12 AM  Contact: [phone] after 3 PM  "Be curious, not judgmental..." -Jamal Maes ---------------------------------------------------------------------------------------

## 2021-12-24 NOTE — Discharge Summary (Signed)
Physician Discharge Summary  Terry [name] TDH:[id number] DOB: Sep 13, 1935 DOA: 12/07/2021  PCP: Crist Infante, MD  Admit date: 12/07/2021 Discharge date: 12/24/2021  Admitted From: Home Disposition:  SNF [facility]  Recommendations for Outpatient Follow-up:  Please check CBC, BMP in 3 days With wound care and dressing changes in the right hand and left arm every 3rd day, please see instructions below, please ensure to apply Xeroform or lubricant to prevent bandage from sticking to the wound. Continue with compression stockings Dose warfarin for mechanical valve, INR target 2.5-3.5   Discharge Condition:Stable CODE STATUS:DNR Diet recommendation:  Regular   Brief/Interim Summary:  Patient is a 86 y.o.  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  See below for further details.       Significant events: 7/28>> fall at [facility] tears on bilateral arms/face-s/p suturing/combat gauze/pressure-subsequently when attempting to ambulate-he syncopized.  Admit to [facility]. 7/29>> orthostatic vital signs positive  Syncope:  -Due to orthostasis -sustained a fall at home and incurred severe left periorbital bruise and laceration, multiple wounds in upper extremities requiring bandages, lives by himself, also on Coumadin with high INR.  Overall weak and frail do not think he is safe for discharge by himself to home at this point, continue PT OT, advance activity, echo, MRI nonacute.  Orthostatics have improved, stable on telemetry, continue with compression stocking -No further dizziness, lightheadedness after blood transfusion and stable hemoglobin and compression stockings.   Mechanical  fall with soft tissue injury involving left pelvic bruising, left periorbital bruising, bilateral upper extremities with bleeding and acute blood loss anemia:  -  Per patient he is updated with his tetanus vaccine.  Note-he required suture to his left forearm small arterial bleeding.  -  He is still bleeding intermittently during dressing changes , but is much less often currently unresolved with bandages, and applying nitro stick occasionally  Mechanical mitral valve replacement:  - On Coumadin, Goal INR 2.5-3.5, -Will require heparin drip bridge till INR was therapeutic.   Low-density changes seen on occipital lobe on CT imaging:  - MRI brain nonacute.   HTN: Blood pressure is improved low-dose beta-blocker added.   Chronic anemia Acute on chronic blood loss anemia -Received another unit PRBC yesterday, hemoglobin is stable today, continue to monitor closely.     HLD: On statin   BMI: Estimated body mass index is 23.01 kg/m as calculated from the following:   Height as of this encounter: '5\' 11"'$  (1.803 m).   Weight as of this encounter: 74.8 kg.    Discharge Diagnoses:  Principal Problem:   Syncope Active Problems:   H/O mitral valve replacement with mechanical valve   Chronic anticoagulation   Fall   Multiple abrasions   Hematoma   Acute blood loss anemia   Essential hypertension   Hyperlipidemia    Discharge Instructions  Discharge Instructions     Discharge instructions   Complete by: As directed    Follow with Primary MD Crist Infante, MD /SNF Physician  Get CBC, CMP,  checked  by Primary MD next visit.    Activity: As tolerated with Full fall precautions use walker/cane & assistance as needed   Disposition SNF   Diet: Heart Healthy  On your next visit with your primary care physician please Get Medicines reviewed and adjusted.   Please request your Prim.MD to go over all Hospital Tests and Procedure/Radiological results at the follow up, please  get all Hospital records sent to your Prim MD by signing hospital release before you go home.   If you experience worsening of your admission symptoms, develop shortness of breath, life threatening emergency, suicidal or homicidal thoughts you must seek medical attention immediately by calling 911 or calling your MD immediately  if symptoms less severe.  You Must read complete instructions/literature along with all the possible adverse reactions/side effects for all the Medicines you take and that have been prescribed to you. Take any new Medicines after you have completely understood and accpet all the possible adverse reactions/side effects.   Do not drive, operating heavy machinery, perform activities at heights, swimming or participation in water activities or provide baby sitting services if your were admitted for syncope or siezures until you have seen by Primary MD or a Neurologist and advised to do so again.  Do not drive when taking Pain medications.    Do not take more than prescribed Pain, Sleep and Anxiety Medications  Special Instructions: If you have smoked or chewed Tobacco  in the last 2 yrs please stop smoking, stop any regular Alcohol  and or any Recreational drug use.  Wear Seat belts while driving.   Please note  You were cared for by a hospitalist during your hospital stay. If you have any questions about your discharge medications or the care you received while you were in the hospital after you are discharged, you can call the unit and asked to speak with the hospitalist on call if the hospitalist that took care of you is not available. Once you are discharged, your primary care physician will handle any further medical issues. Please note that NO REFILLS for any discharge medications will be authorized once you are discharged, as it is imperative that you return to your primary care physician (or establish a relationship with a primary care physician if you do not have  one) for your aftercare needs so that they can reassess your need for medications and monitor your lab values.   Discharge wound care:   Complete by: As directed    Remove dressings UEs/LEs.  Rinse areas with saline Cover wounds with single layer of xeroform if not active oozing; Lawson # 294 Cover wounds with bleeding/oozing with calcium alginate; Kellie Simmering # [id number] Use silver nitrate if needed to stop any active oozing/bleeding, see silver nitrate orders Cover with ABD pads, secure with kerlix and coban  Change every third day.   Increase activity slowly   Complete by: As directed       Allergies as of 12/24/2021       Reactions   Cipro [ciprofloxacin Hcl] Other (See Comments)   Heartburn    Sulfa Drugs Cross Reactors Hives        Medication List     STOP taking these medications    aspirin 81 MG tablet   metoprolol succinate 100 MG 24 hr tablet Commonly known as: TOPROL-XL   omeprazole 20 MG tablet Commonly known as: PRILOSEC OTC Replaced by: pantoprazole 40 MG tablet   propranolol 10 MG tablet Commonly known as: INDERAL       TAKE these medications    acetaminophen 500 MG tablet Commonly known as: TYLENOL Take 1 tablet (500 mg total) by mouth every 6 (six) hours  as needed for mild pain, moderate pain or headache.   CALCIUM + D PO Take 600 mg by mouth daily.   cholecalciferol 25 MCG (1000 UNIT) tablet Commonly known as: VITAMIN D3 Take 1,000 Units by mouth daily.   Cosopt PF 22.3-6.8 MG/ML Soln ophthalmic solution Generic drug: dorzolamidel-timolol Place 1 drop into both eyes 2 (two) times daily.   ferrous sulfate 325 (65 FE) MG tablet Take 1 tablet (325 mg total) by mouth 2 (two) times daily with a meal.   folic acid 1 MG tablet Commonly known as: FOLVITE Take 1 tablet (1 mg total) by mouth daily. Start taking on: December 25, 2021   metoprolol tartrate 25 MG tablet Commonly known as: LOPRESSOR Take 1 tablet (25 mg total) by mouth 2 (two) times  daily.   multivitamin per tablet Take 1 tablet by mouth daily.   pantoprazole 40 MG tablet Commonly known as: PROTONIX Take 1 tablet (40 mg total) by mouth every other day. Start taking on: December 25, 2021 Replaces: omeprazole 20 MG tablet   polyethylene glycol 17 g packet Commonly known as: MIRALAX / GLYCOLAX Take 17 g by mouth daily as needed for moderate constipation.   rosuvastatin 20 MG tablet Commonly known as: CRESTOR Take 20 mg by mouth daily.   senna-docusate 8.6-50 MG tablet Commonly known as: Senokot-S Take 1 tablet by mouth 2 (two) times daily.   silver nitrate applicators 65-78 % applicator Apply 1 Stick (1 Application total) topically as needed for bleeding (use 1-2 applicators/sticks to stop oozing).   warfarin 5 MG tablet Commonly known as: COUMADIN Take as directed. If you are unsure how to take this medication, talk to your nurse or doctor. Original instructions: TAKE AS DIRECTED BY ANTICOAGULATION CLINIC What changed: See the new instructions.   Zioptan 0.0015 % Soln Generic drug: Tafluprost (PF) Place 1 drop into both eyes at bedtime.               Discharge Care Instructions  (From admission, onward)           Start     Ordered   12/24/21 0000  Discharge wound care:       Comments: Remove dressings UEs/LEs.  Rinse areas with saline Cover wounds with single layer of xeroform if not active oozing; Lawson # 294 Cover wounds with bleeding/oozing with calcium alginate; Kellie Simmering # [id number] Use silver nitrate if needed to stop any active oozing/bleeding, see silver nitrate orders Cover with ABD pads, secure with kerlix and coban  Change every third day.   12/24/21 1000            Follow-up Information     Crist Infante, MD. Schedule an appointment as soon as possible for a visit in 3 days.   Specialty: Internal Medicine Contact information: [place] [address] [phone]         Go to  [place].   Specialty: Emergency Medicine Why: If symptoms worsen Contact information: [address] [id number] [place] [phone]               Allergies  Allergen Reactions   Cipro [Ciprofloxacin Hcl] Other (See Comments)    Heartburn    Sulfa Drugs Cross Reactors Hives    Consultations: none   Procedures/Studies: DG Chest Port 1 View  Result Date: 12/09/2021 CLINICAL DATA:  Shortness of breath. Level 2 trauma. Fell on thinners. EXAM: PORTABLE CHEST 1 VIEW COMPARISON:  None  Available. FINDINGS: Postoperative changes in the mediastinum. Heart size and pulmonary vascularity are normal. Emphysematous changes in the lungs with fibrosis scattered throughout the lungs. Bilateral upper lobe scarring and apical pleural calcifications are likely postinflammatory. No airspace disease or consolidation. No pleural effusions. No pneumothorax. Mediastinal contours appear intact. IMPRESSION: Emphysematous changes, fibrosis, and apical postinflammatory changes demonstrated in the chest. No focal consolidation. Electronically Signed   By: Lucienne Capers M.D.   On: 12/09/2021 20:27   MR BRAIN WO CONTRAST  Result Date: 12/08/2021 CLINICAL DATA:  Initial evaluation for acute dizziness. EXAM: MRI HEAD WITHOUT CONTRAST TECHNIQUE: Multiplanar, multiecho pulse sequences of the brain and surrounding structures were obtained without intravenous contrast. COMPARISON:  Prior head CT from 12/07/2021 as well as brain MRI from 06/19/2015. FINDINGS: Brain: Generalized age-related cerebral atrophy. Scattered patchy T2/FLAIR hyperintensity involving the periventricular deep white matter both cerebral hemispheres as well as the pons, most consistent with chronic small vessel ischemic disease. Changes are progressed as compared to 2017. Few scattered superimposed remote lacunar infarcts present about the pons/midbrain and cerebellum. Encephalomalacia and  gliosis involving the right occipital lobe with associated susceptibility artifact, consistent with prior hemorrhagic infarction or bleed at this location. Additionally, there are multiple scattered chronic micro hemorrhages involving both cerebral and cerebellar hemispheres. While these could be hypertensive/small vessel related, possible cerebral amyloid angiopathy could also be considered. No acute or subacute intracranial infarct. Gray-white matter differentiation maintained. No acute intracranial hemorrhage. No mass lesion, midline shift or mass effect. No hydrocephalus or extra-axial fluid collection. Pituitary gland grossly within normal limits. Vascular: Major intracranial vascular flow voids are maintained. Skull and upper cervical spine: Craniocervical junction within normal limits. Bone marrow signal intensity within normal limits. Small left periorbital/facial contusion noted. Sinuses/Orbits: Globes and orbital soft tissues demonstrate no acute finding. Mild scattered mucosal thickening about the ethmoidal air cells. Paranasal sinuses are otherwise clear. Trace right mastoid effusion noted from about significance. Other: None. IMPRESSION: 1. No acute intracranial abnormality. 2. Small left periorbital/facial contusion. 3. Chronic encephalomalacia of with hemosiderin staining at the right occipital lobe, consistent with remote hemorrhagic infarct and/or bleed at this location. 4. Generalized age-related cerebral atrophy with chronic small vessel ischemic disease, with a few scattered remote lacunar infarcts involving the pons/midbrain and cerebellum. Overall, changes are progressed as compared to 2017. 5. Multiple scattered chronic micro hemorrhages involving both cerebral and cerebellar hemispheres. Findings are nonspecific, and could be related to chronic underlying hypertension or possibly cerebral amyloid angiopathy. Electronically Signed   By: Jeannine Boga M.D.   On: 12/08/2021 20:01    ECHOCARDIOGRAM COMPLETE  Result Date: 12/08/2021    ECHOCARDIOGRAM REPORT   Patient Name:   JOVIAN LEMBCKE Date of Exam: 12/08/2021 Medical Rec #:  [id number]          Height:       71.0 in Accession #:    [id number]         Weight:       165.0 lb Date of Birth:  [birth year]           BSA:          1.943 m Patient Age:    86 years           BP:           124/69 mmHg Patient Gender: M                  HR:  92 bpm. Exam Location:  Inpatient Procedure: 2D Echo, Color Doppler, Cardiac Doppler and Intracardiac            Opacification Agent Indications:    R55 Syncope  History:        Patient has prior history of Echocardiogram examinations, most                 recent 10/17/2006. Risk Factors:Dyslipidemia. Mechanical MVR June                 2003.  Sonographer:    Raquel Sarna Senior RDCS Referring Phys: [id number] [place]  1. Study is not well visualized. Left ventricular ejection fraction, by estimation, is 60 to 65%. The left ventricle has normal function. The left ventricle has no regional wall motion abnormalities. Left ventricular diastolic parameters are consistent with Grade I diastolic dysfunction (impaired relaxation).  2. Right ventricular systolic function is normal. The right ventricular size is not well visualized. Tricuspid regurgitation signal is inadequate for assessing PA pressure.  3. S/p mechanical MVR 2003. MV mean gradient 4 mmHg at HR 92 bpm. No significant stenosis; normal prosthesis. No evidence of mitral valve regurgitation.  4. The aortic valve was not well visualized. Aortic valve regurgitation is not visualized. No aortic stenosis is present.  5. The inferior vena cava is normal in size with greater than 50% respiratory variability, suggesting right atrial pressure of 3 mmHg. Comparison(s): No prior Echocardiogram. Conclusion(s)/Recommendation(s): Normal biventricular function without evidence of hemodynamically significant valvular heart disease. FINDINGS  Left Ventricle:  Study is not well visualized. Left ventricular ejection fraction, by estimation, is 60 to 65%. The left ventricle has normal function. The left ventricle has no regional wall motion abnormalities. Definity contrast agent was given IV to delineate the left ventricular endocardial borders. The left ventricular internal cavity size was normal in size. Left ventricular diastolic parameters are consistent with Grade I diastolic dysfunction (impaired relaxation). Right Ventricle: The right ventricular size is not well visualized. Right ventricular systolic function is normal. Tricuspid regurgitation signal is inadequate for assessing PA pressure. Left Atrium: Left atrial size was normal in size. Right Atrium: Right atrial size was normal in size. Pericardium: There is no evidence of pericardial effusion. Mitral Valve: S/p mechanical MVR 2003. MV mean gradient 4 mmHg at HR 92 bpm. No significant stenosis; normal prosthesis. No evidence of mitral valve regurgitation. MV peak gradient, 7.8 mmHg. The mean mitral valve gradient is 4.5 mmHg. Tricuspid Valve: Tricuspid valve regurgitation is not demonstrated. Aortic Valve: The aortic valve was not well visualized. Aortic valve regurgitation is not visualized. No aortic stenosis is present. Pulmonic Valve: The pulmonic valve was not well visualized. Pulmonic valve regurgitation is not visualized. Aorta: The aortic root and ascending aorta are structurally normal, with no evidence of dilitation. Venous: The inferior vena cava is normal in size with greater than 50% respiratory variability, suggesting right atrial pressure of 3 mmHg. IAS/Shunts: No atrial level shunt detected by color flow Doppler.  LEFT VENTRICLE PLAX 2D LVOT diam:     2.00 cm   Diastology LV SV:         52        LV e' medial:    6.53 cm/s LV SV Index:   27        LV E/e' medial:  13.2 LVOT Area:     3.14 cm  LV e' lateral:   8.05 cm/s  LV E/e' lateral: 10.7  RIGHT VENTRICLE RV S prime:      10.20 cm/s TAPSE (M-mode): 1.9 cm LEFT ATRIUM           Index LA Vol (A4C): 42.8 ml 22.03 ml/m  AORTIC VALVE LVOT Vmax:   95.30 cm/s LVOT Vmean:  67.200 cm/s LVOT VTI:    0.165 m  AORTA Ao Root diam: 3.50 cm Ao Asc diam:  3.50 cm MITRAL VALVE MV Area (PHT): 4.31 cm     SHUNTS MV Area VTI:   2.10 cm     Systemic VTI:  0.16 m MV Peak grad:  7.8 mmHg     Systemic Diam: 2.00 cm MV Mean grad:  4.5 mmHg MV Vmax:       1.40 m/s MV Vmean:      100.9 cm/s MV Decel Time: 176 msec MV E velocity: 85.90 cm/s MV A velocity: 111.00 cm/s MV E/A ratio:  0.77 Mary Scientist, physiological signed by Phineas Inches Signature Date/Time: 12/08/2021/2:27:52 PM    Final    CT HEAD WO CONTRAST (5MM)  Result Date: 12/07/2021 CLINICAL DATA:  Fall.  Left eye pain.  Head trauma EXAM: CT HEAD WITHOUT CONTRAST CT MAXILLOFACIAL WITHOUT CONTRAST TECHNIQUE: Multidetector CT imaging of the head and maxillofacial structures were performed using the standard protocol without intravenous contrast. Multiplanar CT image reconstructions of the maxillofacial structures were also generated. RADIATION DOSE REDUCTION: This exam was performed according to the departmental dose-optimization program which includes automated exposure control, adjustment of the mA and/or kV according to patient size and/or use of iterative reconstruction technique. 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 Elsewhere, no evidence of an acute infarction. No evidence of hemorrhage, hydrocephalus, extra-axial collection or mass lesion/mass effect. Scattered low-density changes within the periventricular and subcortical white matter compatible with chronic microvascular ischemic change. Mild diffuse cerebral volume loss. Vascular: Atherosclerotic calcifications involving the large vessels of the skull base. No unexpected hyperdense vessel. Skull: Normal. Negative for fracture  or focal lesion. Other: Negative for scalp hematoma. CT MAXILLOFACIAL FINDINGS Osseous: No acute maxillofacial bone fracture. Bony orbital walls are intact. Mandible intact. Temporomandibular joints are aligned without dislocation. Degenerative changes of the temporomandibular joints. Orbits: Negative. No traumatic or inflammatory finding. Sinuses: Clear. Soft tissues: Left periorbital soft tissue swelling. IMPRESSION: 1. 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 Consider dedicated MRI to further characterize. 2. Otherwise, no acute intracranial findings. 3. Left periorbital soft tissue swelling without acute maxillofacial bone fracture. Electronically Signed   By: Davina Poke D.O.   On: 12/07/2021 13:59   CT Maxillofacial Wo Contrast  Result Date: 12/07/2021 CLINICAL DATA:  Fall.  Left eye pain.  Head trauma EXAM: CT HEAD WITHOUT CONTRAST CT MAXILLOFACIAL WITHOUT CONTRAST TECHNIQUE: Multidetector CT imaging of the head and maxillofacial structures were performed using the standard protocol without intravenous contrast. Multiplanar CT image reconstructions of the maxillofacial structures were also generated. RADIATION DOSE REDUCTION: This exam was performed according to the departmental dose-optimization program which includes automated exposure control, adjustment of the mA and/or kV according to patient size and/or use of iterative reconstruction technique. 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 Elsewhere, no evidence of an acute infarction. No evidence of hemorrhage, hydrocephalus, extra-axial collection or mass lesion/mass effect. Scattered low-density changes within the periventricular and subcortical white  matter compatible with chronic microvascular ischemic change. Mild diffuse cerebral volume loss. Vascular: Atherosclerotic calcifications  involving the large vessels of the skull base. No unexpected hyperdense vessel. Skull: Normal. Negative for fracture or focal lesion. Other: Negative for scalp hematoma. CT MAXILLOFACIAL FINDINGS Osseous: No acute maxillofacial bone fracture. Bony orbital walls are intact. Mandible intact. Temporomandibular joints are aligned without dislocation. Degenerative changes of the temporomandibular joints. Orbits: Negative. No traumatic or inflammatory finding. Sinuses: Clear. Soft tissues: Left periorbital soft tissue swelling. IMPRESSION: 1. 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 Consider dedicated MRI to further characterize. 2. Otherwise, no acute intracranial findings. 3. Left periorbital soft tissue swelling without acute maxillofacial bone fracture. Electronically Signed   By: Davina Poke D.O.   On: 12/07/2021 13:59   DG Forearm Left  Result Date: 12/07/2021 CLINICAL DATA:  Fall, left forearm pain EXAM: LEFT FOREARM - 2 VIEW COMPARISON:  None Available. FINDINGS: There is no evidence of fracture or dislocation. Soft tissue swelling over the dorsum of the forearm. IMPRESSION: No acute fracture or dislocation. Soft tissue swelling over the dorsum of the forearm. Electronically Signed   By: Davina Poke D.O.   On: 12/07/2021 13:42   DG Hand Complete Right  Result Date: 12/07/2021 CLINICAL DATA:  Fall.  Right hand laceration EXAM: RIGHT HAND - COMPLETE 3+ VIEW COMPARISON:  None Available. FINDINGS: No acute fracture. No dislocation. Severe arthropathy of the first [facility] joint. Mild-to-moderate degenerative changes throughout the rest of the hand. Mild soft tissue swelling. Scattered atherosclerotic vascular calcifications. IMPRESSION: 1. No acute osseous abnormality of the right hand. 2. Degenerative changes of the hand, severe at the first [facility] joint. Electronically Signed   By: Davina Poke D.O.   On: 12/07/2021 13:41      Subjective: No  significant events overnight, he denies any dizziness or lightheadedness currently, he had bleeding through his right gauze yesterday, required dress changing couple times with thromboplastin, no recurrence since.  Discharge Exam: Vitals:   12/24/21 0411 12/24/21 0801  BP: 128/68 105/63  Pulse: 95 (!) 101  Resp: 18 19  Temp: 97.7 F (36.5 C) 98 F (36.7 C)  SpO2: 97% 98%   Vitals:   12/23/21 1900 12/23/21 1940 12/24/21 0411 12/24/21 0801  BP: 115/65 119/66 128/68 105/63  Pulse: 100 98 95 (!) 101  Resp: '18 18 18 19  '$ Temp: 97.6 F (36.4 C) 97.9 F (36.6 C) 97.7 F (36.5 C) 98 F (36.7 C)  TempSrc: Oral Oral Oral Axillary  SpO2: 100% 99% 97% 98%  Weight:      Height:        General: Pt is alert, awake, not in acute distress Cardiovascular: RRR, S1/S2 +, no rubs, no gallops Respiratory: CTA bilaterally, no wheezing, no rhonchi Abdominal: Soft, NT, ND, bowel sounds + Extremities: Some left lower extremity edema can due  to left hip bruising Significant bruising/hematomas due to his fall,left  periorbital bruising, left arm and right hand are bandaged, significant left hip ecchymosis    The results of significant diagnostics from this hospitalization (including imaging, microbiology, ancillary and laboratory) are listed below for reference.     Microbiology: No results found for this or any previous visit (from the past 240 hour(s)).   Labs: BNP (last 3 results) Recent Labs    12/11/21 0229 12/12/21 0647 12/13/21 0603  BNP 64.8 115.5* 921.1*   Basic Metabolic Panel: Recent Labs  Lab 12/19/21 0454  NA 134*  K 4.3  CL 102  CO2 22  GLUCOSE 119*  BUN 26*  CREATININE 0.86  CALCIUM 8.1*   Liver Function Tests: No results for input(s): "AST", "ALT", "ALKPHOS", "BILITOT", "PROT", "ALBUMIN" in the last 168 hours. No results for input(s): "LIPASE", "AMYLASE" in the last 168 hours. No results for input(s): "AMMONIA" in the last 168 hours. CBC: Recent Labs  Lab  12/21/21 0224 12/22/21 0230 12/23/21 0153 12/23/21 2114 12/24/21 0723  WBC 11.1* 9.7 10.1 11.2* 8.7  HGB 9.0* 9.1* 9.2* 10.3* 9.9*  HCT 27.6* 27.5* 28.1* 30.9* 30.4*  MCV 95.2 94.8 94.9 93.6 95.0  PLT 320 310 348 392 378   Cardiac Enzymes: No results for input(s): "CKTOTAL", "CKMB", "CKMBINDEX", "TROPONINI" in the last 168 hours. BNP: Invalid input(s): "POCBNP" CBG: No results for input(s): "GLUCAP" in the last 168 hours. D-Dimer No results for input(s): "DDIMER" in the last 72 hours. Hgb A1c No results for input(s): "HGBA1C" in the last 72 hours. Lipid Profile No results for input(s): "CHOL", "HDL", "LDLCALC", "TRIG", "CHOLHDL", "LDLDIRECT" in the last 72 hours. Thyroid function studies No results for input(s): "TSH", "T4TOTAL", "T3FREE", "THYROIDAB" in the last 72 hours.  Invalid input(s): "FREET3" Anemia work up No results for input(s): "VITAMINB12", "FOLATE", "FERRITIN", "TIBC", "IRON", "RETICCTPCT" in the last 72 hours. Urinalysis No results found for: "COLORURINE", "APPEARANCEUR", "LABSPEC", "PHURINE", "GLUCOSEU", "HGBUR", "BILIRUBINUR", "KETONESUR", "PROTEINUR", "UROBILINOGEN", "NITRITE", "LEUKOCYTESUR" Sepsis Labs Recent Labs  Lab 12/22/21 0230 12/23/21 0153 12/23/21 2114 12/24/21 0723  WBC 9.7 10.1 11.2* 8.7   Microbiology No results found for this or any previous visit (from the past 240 hour(s)).   Time coordinating discharge: Over 30 minutes  SIGNED:   Phillips Climes, MD  [facility] 12/24/2021, 10:00 AM Pager   If 7PM-7AM, please contact night-coverage [email or web address]

## 2021-12-24 NOTE — TOC Transition Note (Signed)
Transition of Care (TOC) - CM/SW Discharge Note   Patient Details  Name: Terry [name] MRN: [id number] Date of Birth: 10/27/1935  Transition of Care (TOC) CM/SW Contact:  Elizabeth M Paisley, LCSW Phone Number: 12/24/2021, 12:45 PM   Clinical Narrative:   CSW confirmed with MD that patient is medically stable for discharge, and confirmed with [facility] that bed is available for patient. CSW met with patient and he is in agreement to transition to SNF today. Niece, [name], to provide transport to SNF.  Nurse to call report to [phone], Room 104.    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             

## 2022-01-03 ENCOUNTER — Ambulatory Visit: Payer: Medicare Other

## 2022-01-03 DIAGNOSIS — Z5181 Encounter for therapeutic drug level monitoring: Secondary | ICD-10-CM | POA: Diagnosis not present

## 2022-01-03 DIAGNOSIS — Z952 Presence of prosthetic heart valve: Secondary | ICD-10-CM | POA: Diagnosis not present

## 2022-01-03 LAB — POCT INR: INR: 4.7 — AB (ref 2.0–3.0)

## 2022-01-03 NOTE — Patient Instructions (Signed)
HOLD TONIGHT ONLY and then Continue taking Warfarin 1 tablet daily except 1/2 tablet on Mondays and Fridays. Recheck INR in 3 weeks. [facility] [phone]

## 2022-01-24 ENCOUNTER — Ambulatory Visit: Payer: Medicare Other | Attending: Cardiology

## 2022-01-24 DIAGNOSIS — Z952 Presence of prosthetic heart valve: Secondary | ICD-10-CM | POA: Diagnosis not present

## 2022-01-24 DIAGNOSIS — Z5181 Encounter for therapeutic drug level monitoring: Secondary | ICD-10-CM | POA: Diagnosis not present

## 2022-01-24 LAB — POCT INR: INR: 2.6 (ref 2.0–3.0)

## 2022-01-24 NOTE — Patient Instructions (Signed)
Continue taking Warfarin 1 tablet daily except 1/2 tablet on Mondays and Fridays. Recheck INR in 5 weeks. [facility] [phone]

## 2022-02-02 ENCOUNTER — Other Ambulatory Visit: Payer: Self-pay | Admitting: Cardiovascular Disease

## 2022-02-02 DIAGNOSIS — Z7901 Long term (current) use of anticoagulants: Secondary | ICD-10-CM

## 2022-02-02 DIAGNOSIS — Z952 Presence of prosthetic heart valve: Secondary | ICD-10-CM

## 2022-02-04 NOTE — Telephone Encounter (Signed)
Prescription refill request received for warfarin Lov: Nahser, 08/15/2021  Next INR check: 10/19 Warfarin tablet strength: '5mg'$ 

## 2022-02-28 ENCOUNTER — Ambulatory Visit: Payer: Medicare Other | Attending: Cardiovascular Disease

## 2022-02-28 DIAGNOSIS — Z5181 Encounter for therapeutic drug level monitoring: Secondary | ICD-10-CM | POA: Diagnosis not present

## 2022-02-28 DIAGNOSIS — Z952 Presence of prosthetic heart valve: Secondary | ICD-10-CM | POA: Diagnosis not present

## 2022-02-28 LAB — POCT INR: INR: 4.2 — AB (ref 2.0–3.0)

## 2022-02-28 NOTE — Patient Instructions (Signed)
HOLD FRIDAY AND SATURDAY then  Continue taking Warfarin 1 tablet daily except 1/2 tablet on Mondays and Fridays. Recheck INR in 2 weeks. [facility] [phone]

## 2022-03-14 ENCOUNTER — Ambulatory Visit: Payer: Medicare Other | Attending: Cardiology

## 2022-03-14 DIAGNOSIS — Z952 Presence of prosthetic heart valve: Secondary | ICD-10-CM

## 2022-03-14 DIAGNOSIS — Z5181 Encounter for therapeutic drug level monitoring: Secondary | ICD-10-CM | POA: Diagnosis not present

## 2022-03-14 LAB — POCT INR: INR: 3.5 — AB (ref 2.0–3.0)

## 2022-03-14 NOTE — Patient Instructions (Signed)
Continue taking Warfarin 1 tablet daily except 1/2 tablet on Mondays and Fridays. Recheck INR in 4 weeks. [facility] [phone]

## 2022-03-18 ENCOUNTER — Emergency Department (HOSPITAL_COMMUNITY)
Admission: EM | Admit: 2022-03-18 | Discharge: 2022-03-19 | Disposition: A | Discharge status: Home / Self Care | Payer: Medicare Other | Attending: Emergency Medicine | Admitting: Emergency Medicine

## 2022-03-18 DIAGNOSIS — Z7901 Long term (current) use of anticoagulants: Secondary | ICD-10-CM | POA: Diagnosis not present

## 2022-03-18 DIAGNOSIS — H9221 Otorrhagia, right ear: Secondary | ICD-10-CM | POA: Insufficient documentation

## 2022-03-18 DIAGNOSIS — I10 Essential (primary) hypertension: Secondary | ICD-10-CM | POA: Insufficient documentation

## 2022-03-18 DIAGNOSIS — Z79899 Other long term (current) drug therapy: Secondary | ICD-10-CM | POA: Diagnosis not present

## 2022-03-19 ENCOUNTER — Other Ambulatory Visit: Payer: Self-pay

## 2022-03-19 ENCOUNTER — Encounter (HOSPITAL_COMMUNITY): Payer: Self-pay | Admitting: Emergency Medicine

## 2022-03-19 NOTE — ED Triage Notes (Signed)
Patient states that he was not able to hear the TV around noon today, pulled his hearing aid out and it was covered in blood.  Patient states that he put his hearing aid in earlier this morning.  Patient is on coumadin and the ear continues to trickle blood from ear.

## 2022-03-19 NOTE — ED Provider Triage Note (Signed)
Emergency Medicine Provider Triage Evaluation Note  Terry [name] , a 86 y.o. male  was evaluated in triage.  Pt complains of ear bleeding. States this morning he placed his hearing aid in the right ear. States he noted his hearing had decreased while watching tv today and pulled his hearing aid out and it was covered in blood. On coumadin. States he was evaluated at an urgent care earlier and was able to get the bleeding to stop but has since recurred.   Review of Systems  Positive: See above Negative:   Physical Exam  BP 137/76   Pulse 81   Temp (!) 97.5 F (36.4 C) (Oral)   Resp 18   SpO2 96%  Gen:   Awake, no distress   Resp:  Normal effort  MSK:   Moves extremities without difficulty  Other:  Right ear with blood in the external canal. TM intact without perforation.   Medical Decision Making  Medically screening exam initiated at 12:14 AM.  Appropriate orders placed.  Terry [name] was informed that the remainder of the evaluation will be completed by another provider, this initial triage assessment does not replace that evaluation, and the importance of remaining in the ED until their evaluation is complete.     Mickie Hillier, PA-C 03/19/22 0015

## 2022-03-19 NOTE — Discharge Instructions (Signed)
You were seen today for bleeding in your right ear. We left some combat gauze in your right ear. Remove this in the morning today 03/19/22 when you would normally wake up to evaluate if you have ongoing bleeding. Please do not re-insert your hearing aid until the small wound in your ear heals. Do not stick a qtip or other material into the ear. Return to primary care, urgent care or ER if you have recurrence of bleeding.

## 2022-03-19 NOTE — ED Provider Notes (Signed)
[facility] EMERGENCY DEPARTMENT Provider Note   CSN: [id number] Arrival date & time: 03/18/22  2337     History  Chief Complaint  Patient presents with   Ear Drainage    Terry [name] is a 86 y.o. male. With past medical history of MVP s/p MV replacement on coumadin, HTN who presents with ear bleeding.  States this morning he placed his hearing aids in his ears. By lunch time he states he was having decreased hearing in the right ear and so removed the hearing aid and noted blood.  States he went to urgent care and had silver nitrate placed. States that this stopped the bleeding but has since returned. States his hearing is normal now. No recent ear pain or drainage or fevers. No swimming or submersion in water.    Ear Drainage       Home Medications Prior to Admission medications   Medication Sig Start Date End Date Taking? Authorizing Provider  acetaminophen (TYLENOL) 500 MG tablet Take 1 tablet (500 mg total) by mouth every 6 (six) hours as needed for mild pain, moderate pain or headache. 12/24/21   Elgergawy, Silver Huguenin, MD  Calcium Carbonate-Vitamin D (CALCIUM + D PO) Take 600 mg by mouth daily.    [provider]  Cholecalciferol (VITAMIN D3) 1000 UNITS tablet Take 1,000 Units by mouth daily.    [provider]  COSOPT PF 22.3-6.8 MG/ML SOLN ophthalmic solution Place 1 drop into both eyes 2 (two) times daily. 04/25/17   [provider]  ferrous sulfate 325 (65 FE) MG tablet Take 1 tablet (325 mg total) by mouth 2 (two) times daily with a meal. 12/24/21   Elgergawy, Silver Huguenin, MD  folic acid (FOLVITE) 1 MG tablet Take 1 tablet (1 mg total) by mouth daily. 12/25/21   Elgergawy, Silver Huguenin, MD  metoprolol tartrate (LOPRESSOR) 25 MG tablet Take 1 tablet (25 mg total) by mouth 2 (two) times daily. 12/24/21   Elgergawy, Silver Huguenin, MD  multivitamin [facility]) per tablet Take 1 tablet by mouth daily.    [provider]   pantoprazole (PROTONIX) 40 MG tablet Take 1 tablet (40 mg total) by mouth every other day. 12/25/21   Elgergawy, Silver Huguenin, MD  polyethylene glycol (MIRALAX / GLYCOLAX) 17 g packet Take 17 g by mouth daily as needed for moderate constipation. 12/24/21   Elgergawy, Silver Huguenin, MD  rosuvastatin (CRESTOR) 20 MG tablet Take 20 mg by mouth daily.    [provider]  senna-docusate (SENOKOT-S) 8.6-50 MG tablet Take 1 tablet by mouth 2 (two) times daily. 12/24/21   Elgergawy, Silver Huguenin, MD  silver nitrate applicators 81-19 % applicator Apply 1 Stick (1 Application total) topically as needed for bleeding (use 1-2 applicators/sticks to stop oozing). 12/24/21   Elgergawy, Silver Huguenin, MD  warfarin (COUMADIN) 5 MG tablet Take 1/2 a tablet to 1 tablet by mouth daily as directed by the coumadin clinic 02/04/22   Nahser, Wonda Cheng, MD  ZIOPTAN 0.0015 % SOLN Place 1 drop into both eyes at bedtime. 04/24/17   [provider]      Allergies    Cipro [ciprofloxacin hcl] and Sulfa drugs cross reactors    Review of Systems   Review of Systems  HENT:  Positive for ear discharge.   All other systems reviewed and are negative.   Physical Exam Updated Vital Signs BP 137/76   Pulse 81   Temp (!) 97.5 F (36.4 C) (Oral)  Resp 18   SpO2 96%  Physical Exam Vitals and nursing note reviewed.  Constitutional:      General: He is not in acute distress.    Appearance: Normal appearance. He is not ill-appearing or toxic-appearing.  HENT:     Head: Normocephalic and atraumatic.     Right Ear: Tympanic membrane normal. There is no impacted cerumen.     Left Ear: Tympanic membrane, ear canal and external ear normal.     Ears:     Comments: Appears to have small trauma that is obscured by previous silver nitrate to the anterior wall of the ear canal. Small amount of blood oozing. The TM is normal without perforation or bleeding.     Mouth/Throat:     Mouth: Mucous membranes are moist.     Pharynx:  Oropharynx is clear.  Eyes:     General: No scleral icterus.    Extraocular Movements: Extraocular movements intact.  Pulmonary:     Effort: Pulmonary effort is normal. No respiratory distress.  Skin:    Findings: No rash.  Neurological:     General: No focal deficit present.     Mental Status: He is alert.  Psychiatric:        Mood and Affect: Mood normal.        Behavior: Behavior normal.        Thought Content: Thought content normal.        Judgment: Judgment normal.     ED Results / Procedures / Treatments   Labs (all labs ordered are listed, but only abnormal results are displayed) Labs Reviewed - No data to display  EKG None  Radiology No results found.  Procedures Procedures    Medications Ordered in ED Medications - No data to display  ED Course/ Medical Decision Making/ A&P                           Medical Decision Making This patient presents to the ED with chief complaint(s) of ear bleeding with pertinent past medical history of anticoagulation which further complicates the presenting complaint. The complaint involves an extensive differential diagnosis and also carries with it a high risk of complications and morbidity.    The differential diagnosis includes perforated TM, wound, etc.    Additional history obtained: Additional history obtained from  none available Records reviewed Care Everywhere/External Records and Primary Care Documents  ED Course and Reassessment: 86 year old male who presents to the emergency department with bleeding from the right ear.  On exam, well appearing and non-toxic in appearance. He does have some mild oozing to the right ear canal. There appears to be a small wound which was previously treated with silver nitrate by urgent care so difficult to see base of wound. No significant hemorrhage from the area. TM appears normal without perforation or bleeding. No pulsatile bleeding or brisk bleeding.   Placed combat gauze  in the ear canal. Instructed to keep this there until the morning when he wakes up and then remove and re-evaluate. Instructed not to use hearing aid or insert qtip etc until he has cessation of bleeding. He verbalizes understanding. Do not feel he needs further work up at this time.  Discharge   Independent labs interpretation:  The following labs were independently interpreted: not indicated   Independent visualization of imaging: Not indicated   Consultation: - Consulted or discussed management/test interpretation w/ external professional: not indicated   Consideration for admission  or further workup: not indicated  Social Determinants of health: none identified  Final Clinical Impression(s) / ED Diagnoses Final diagnoses:  Otorrhagia of right ear    Rx / DC Orders ED Discharge Orders     None         Mickie Hillier, PA-C 03/19/22 0148    Orpah Greek, MD 03/19/22 [phone]

## 2022-03-27 ENCOUNTER — Telehealth: Payer: Self-pay | Admitting: *Deleted

## 2022-03-27 NOTE — Telephone Encounter (Signed)
   Pre-operative Risk Assessment    Patient Name: ANIVAL PASHA  DOB: 1935/05/22 MRN: [id number]      Request for Surgical Clearance    Procedure:   ISTENT/HYDRUS PROCEDURE WITH CATARACT EXTRACTION  Date of Surgery:  Clearance 04/15/22                                 Surgeon:  DR/ Bretta Bang, Surgeon's Group or Practice Name:  [facility]  Phone number:  [phone] Fax number:  [phone]   Type of Clearance Requested:   HOLD WARFARIN x 5 DAYS PRIOR   Type of Anesthesia:   IV SEDATION   Additional requests/questions:    Jiles Prows   03/27/2022, 2:31 PM

## 2022-03-28 NOTE — Telephone Encounter (Signed)
   Patient Name: Terry [name]  DOB: [birth year] MRN: [id number]  Primary Cardiologist: Mertie Moores, MD  Chart reviewed as part of pre-operative protocol coverage. Cataract extractions are recognized in guidelines as low risk surgeries that do not typically require specific preoperative testing or holding of blood thinner therapy. Therefore, given past medical history and time since last visit, based on ACC/AHA guidelines, Terry [name] would be at acceptable risk for the planned procedure without further cardiovascular testing.   Clinical pharmacists also reviewed request and have provided the following recommendations:  Patient with history of mitral valve replacement with mechanical valve on warfarin for anticoagulation. Taking: 2.5 mg every M,F; 5 mg all other days with INR goal of 2.5-3.5   Procedure: ISTENT/HYDRUS PROCEDURE WITH CATARACT EXTRACTION  Date of procedure: 04/15/2022   CrCl ~67 ml/min (Cr 0.86 12/19/2021) Platelet count 378 (12/24/2021)   Per office protocol, cataract removal does not typically require a hold of anticoagulation. If wanting to hold warfarin, would need to consider a lovenox bridge for the patient given his indication.   Lovenox 80 mg subq BID ('1mg'$ /kg *~75kg).   I will route this recommendation to the requesting party via Epic fax function and remove from pre-op pool.  Please call with questions.  Lenna Sciara, NP 03/28/2022, 4:17 PM

## 2022-03-28 NOTE — Telephone Encounter (Signed)
Patient with history of mitral valve replacement with mechanical valve on warfarin for anticoagulation. Taking: 2.5 mg every M,F; 5 mg all other days with INR goal of 2.5-3.5  Procedure: ISTENT/HYDRUS PROCEDURE WITH CATARACT EXTRACTION  Date of procedure: 04/15/2022  CrCl ~67 ml/min (Cr 0.86 12/19/2021) Platelet count 378 (12/24/2021)  Per office protocol, cataract removal does not typically require a hold of anticoagulation. If wanting to hold warfarin, would need to consider a lovenox bridge for the patient given his indication.  Lovenox 80 mg subq BID ('1mg'$ /kg *~75kg)  **This guidance is not considered finalized until pre-operative APP has relayed final recommendations.**   Sandford Craze, PharmD. [facility] PGY-1 03/28/2022 3:14 PM

## 2022-03-29 NOTE — Telephone Encounter (Signed)
  PA Colletta Maryland with [facility]  calling back. She said they do want pt to hold warfarin and agreed for pt to do lovenox bridge since pt is going to have glaucoma procedure.also, they would like to know if pt can also hold aspirin for 7 days prior procedure

## 2022-03-29 NOTE — Telephone Encounter (Signed)
   Patient Name: Terry [name]  DOB: 1935-05-29 MRN: [id number]  Primary Cardiologist: Mertie Moores, MD  Chart reviewed as part of pre-operative protocol coverage.   As requested, and per office protocol, pt may hold ASA for 5-7 days prior to procedure.   I will route this recommendation to the requesting party via Epic fax function and remove from pre-op pool.  Please call with questions.  Lenna Sciara, NP 03/29/2022, 3:14 PM

## 2022-04-01 NOTE — Telephone Encounter (Signed)
Called and spoke with surgery schedulers at [facility] who stated pt is currently still scheduled for ISTENT/HYDRUS PROCEDURE WITH CATARACT EXTRACTION on 04/15/22 which would require holding Warfarin and Lovenox injections per cardiac clearance.  I was transferred to PA to obtain more details and make her aware that pt is under the impression that is it only for cataracts. Had to leave voicemail.

## 2022-04-01 NOTE — Telephone Encounter (Signed)
Called pt to schedule appt with [facility] on 04/09/22 for Lovenox injection instructions. Pt stated he has changed his mind on the procedure and is only having the cataract portion of the procedure and decided to treat the glaucoma with eye drops. I made the pt aware that I do not know is this allowed him to no longer need to hold Warfarin/Lovenox injections.   [place] and left message for Dr Herminio Commons nurse to call [facility] back as soon as possible to clarify.

## 2022-04-02 NOTE — Telephone Encounter (Signed)
Received a call from [place], NP with [facility] and she wanted to let us know the pt has decided to only have the cataract surgery which does not require holding warfarin. She states the pt was originally scheduled for glaucoma surgery which requires holding warfarin and the lovenox bridge per our protocol. The pt will not have to hold warfarin for the cataract surgery and she states he is aware.

## 2022-04-12 ENCOUNTER — Ambulatory Visit: Payer: Medicare Other | Attending: Cardiovascular Disease

## 2022-04-12 DIAGNOSIS — Z5181 Encounter for therapeutic drug level monitoring: Secondary | ICD-10-CM

## 2022-04-12 DIAGNOSIS — Z952 Presence of prosthetic heart valve: Secondary | ICD-10-CM

## 2022-04-12 LAB — POCT INR: INR: 4.2 — AB (ref 2.0–3.0)

## 2022-04-12 NOTE — Patient Instructions (Signed)
Description   HOLD tomorrow's dose and then START taking Warfarin 1 tablet daily except 1/2 tablet on Mondays, Wednesdays, and Fridays. Recheck INR in 2 weeks. [facility] [phone]

## 2022-04-26 ENCOUNTER — Ambulatory Visit: Payer: Medicare Other | Attending: Cardiovascular Disease

## 2022-04-26 DIAGNOSIS — Z952 Presence of prosthetic heart valve: Secondary | ICD-10-CM

## 2022-04-26 DIAGNOSIS — Z5181 Encounter for therapeutic drug level monitoring: Secondary | ICD-10-CM

## 2022-04-26 LAB — POCT INR: INR: 2.9 (ref 2.0–3.0)

## 2022-04-26 NOTE — Patient Instructions (Signed)
Description   Continue taking Warfarin 1 tablet daily except 1/2 tablet on Mondays, Wednesdays, and Fridays. Recheck INR in 3 weeks.  [facility] [phone]

## 2022-05-17 ENCOUNTER — Ambulatory Visit: Payer: Medicare Other | Attending: Cardiology | Admitting: *Deleted

## 2022-05-17 DIAGNOSIS — Z952 Presence of prosthetic heart valve: Secondary | ICD-10-CM

## 2022-05-17 DIAGNOSIS — Z5181 Encounter for therapeutic drug level monitoring: Secondary | ICD-10-CM

## 2022-05-17 LAB — POCT INR: INR: 3.9 — AB (ref 2.0–3.0)

## 2022-05-17 NOTE — Patient Instructions (Signed)
Description   Do not take any warfarin tomorrow (already taken today's dose) then continue taking Warfarin 1 tablet daily except 1/2 tablet on Mondays, Wednesdays, and Fridays. Recheck INR in 2 weeks. [facility] [phone]

## 2022-05-31 ENCOUNTER — Ambulatory Visit: Payer: Medicare Other | Attending: Cardiology | Admitting: *Deleted

## 2022-05-31 DIAGNOSIS — Z952 Presence of prosthetic heart valve: Secondary | ICD-10-CM

## 2022-05-31 DIAGNOSIS — Z5181 Encounter for therapeutic drug level monitoring: Secondary | ICD-10-CM

## 2022-05-31 LAB — POCT INR: INR: 3 (ref 2.0–3.0)

## 2022-05-31 NOTE — Patient Instructions (Signed)
Description   Continue taking Warfarin 1 tablet daily except 1/2 tablet on Mondays, Wednesdays, and Fridays. Recheck INR in 3 weeks. [facility] [phone]

## 2022-06-01 ENCOUNTER — Other Ambulatory Visit: Payer: Self-pay | Admitting: Cardiovascular Disease

## 2022-06-01 DIAGNOSIS — Z7901 Long term (current) use of anticoagulants: Secondary | ICD-10-CM

## 2022-06-01 DIAGNOSIS — Z952 Presence of prosthetic heart valve: Secondary | ICD-10-CM

## 2022-06-03 NOTE — Telephone Encounter (Signed)
Warfarin refill Last INR Visit 05/31/22   Last OV 08/15/21

## 2022-06-18 ENCOUNTER — Ambulatory Visit: Payer: Medicare Other | Attending: Internal Medicine

## 2022-06-18 DIAGNOSIS — Z5181 Encounter for therapeutic drug level monitoring: Secondary | ICD-10-CM

## 2022-06-18 DIAGNOSIS — Z952 Presence of prosthetic heart valve: Secondary | ICD-10-CM | POA: Diagnosis not present

## 2022-06-18 LAB — POCT INR: INR: 3.1 — AB (ref 2.0–3.0)

## 2022-06-18 NOTE — Patient Instructions (Signed)
Continue taking Warfarin 1 tablet daily except 1/2 tablet on Mondays, Wednesdays, and Fridays. Recheck INR in 4 weeks. [facility] [phone]

## 2022-07-16 ENCOUNTER — Ambulatory Visit: Payer: Medicare Other

## 2022-07-26 ENCOUNTER — Ambulatory Visit: Payer: Medicare Other | Attending: Cardiovascular Disease | Admitting: *Deleted

## 2022-07-26 DIAGNOSIS — Z5181 Encounter for therapeutic drug level monitoring: Secondary | ICD-10-CM

## 2022-07-26 DIAGNOSIS — Z952 Presence of prosthetic heart valve: Secondary | ICD-10-CM

## 2022-07-26 LAB — POCT INR: INR: 3.8 — AB (ref 2.0–3.0)

## 2022-07-26 NOTE — Patient Instructions (Addendum)
Description   Tomorrow take 1/2 tablet then continue taking Warfarin 1 tablet daily except 1/2 tablet on Mondays, Wednesdays, and Fridays. Recheck INR in 4 weeks. [facility] [phone]

## 2022-08-21 ENCOUNTER — Encounter: Payer: Self-pay | Admitting: Cardiovascular Disease

## 2022-08-21 NOTE — Progress Notes (Unsigned)
Terry [name]  Date of Birth  10-06-1935     Problem list: 1. Mitral valve replacement- mitral valve repair while in [place] and had mitral valve replacement here with Dr. Tyrone Sage 2003 2. Hyperlipidemia    Notes from 2013:   Terry [name] is a day 87 year old gentleman with a history of mitral valve replacement. He has done very well. His INR Levels have been therapeutic. He has not had any problems.  He is doing very well.  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   Oct. 24, 2014:  Terry [name] has done well.  Still volunteering at the hospital.   No CP , no dyspnea.   INR levels are ok.    November 19, 2013:  Terry [name] is doing ok but has been having more palpitations associated with a very brief episode of lightheadedness. He's not had any episodes of syncope. These episodes seem to be more common in the morning and can occur when he is walking down the hall. He also has an occasionally at night when he is relaxing.  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 He will walks for exercise everyday.   02/21/2015.  Doing well. Still working  at the hospital .  [facility] 5 days a week - 3 miles  No CP or dyspnea.  No further episodes of lightheadedness.   Oct. 20, 2017:   Doing well Still working at [employer]  INR levels are great.   Jan. 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  Tries to walk every day  No CP or dyspnea.  INR is theraputic.   May 28, 2018: Terry [name] is seen today for follow-up of his mitral valve replacement.  He is done well.  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  Walks daily  INR levels have been therapeutic.  He denies any bleeding.  He does have occasional bruises.  May 28, 2019:  Terry [name] is seen for follow-up of his mitral valve replacement.  He is on Coumadin.  His INR levels have been therapeutic.  He has a  history of hyperlipidemia and is on rosuvastatin. Has had his first covid injection  Still very active .   Is on hiatus from volunteering at the hospital   Jan. 21, 2022  Terry [name] is doing ok today  INR is 4.1 today  Still volunteering at [facility] .   August 15, 2021: Terry [name] is doing well.  Hx of MV replacement in 2003 Tyrone Sage)  Still volunteers at [facility] about 3 miles day INR levels are good ,  reviewed, theraputic  August 22, 2022 Terry [name] is seen today for follow upf of his MV. Very active Getting lots of exercise Volunteers at the hospital    Current Outpatient Medications on File Prior to Visit  Medication Sig Dispense Refill   acetaminophen (TYLENOL) 500 MG tablet Take 1 tablet (500 mg total) by mouth every 6 (six) hours as needed for mild pain, moderate pain or headache. 30 tablet 0   Calcium Carbonate-Vitamin D (CALCIUM + D PO) Take 600 mg by mouth daily.     Cholecalciferol (VITAMIN D3) 1000 UNITS tablet Take 1,000 Units by mouth daily.     COSOPT PF 22.3-6.8 MG/ML SOLN ophthalmic solution Place 1 drop into both eyes 2 (two) times  daily.  3   metoprolol succinate (TOPROL-XL) 50 MG 24 hr tablet Take 50 mg by mouth daily.     multivitamin (THERAGRAN) per tablet Take 1 tablet by mouth daily.     pantoprazole (PROTONIX) 40 MG tablet Take 1 tablet (40 mg total) by mouth every other day.     rosuvastatin (CRESTOR) 20 MG tablet Take 20 mg by mouth daily.     warfarin (COUMADIN) 5 MG tablet TAKE 1/2 A TABLET TO 1 TABLET BY MOUTH DAILY AS DIRECTED BY THE COUMADIN CLINIC 90 tablet 1   ZIOPTAN 0.0015 % SOLN Place 1 drop into both eyes at bedtime.  3   aspirin EC 81 MG tablet Take 81 mg by mouth daily.     No current facility-administered medications on file prior to visit.    Allergies  Allergen Reactions   Alendronate     Other Reaction(s): GERD   Aspirin     Other Reaction(s): too much bruising when added to Coumadin   Cipro [Ciprofloxacin Hcl] Other (See  Comments)    Heartburn    Sulfa Drugs Cross Reactors Hives    Past Medical History:  Diagnosis Date   Benign prostatic hypertrophy    Dyslipidemia    Hyperlipidemia    Long-term (current) use of anticoagulants    Mitral valve prolapse    Osteoporosis    Ventricular tachycardia     Past Surgical History:  Procedure Laterality Date   APPENDICO-VESICOSTOMY CUTANEOUS     CARDIAC CATHETERIZATION  08/19/2001   Essentially smooth and normal coronary arteries   CORONARY STENT PLACEMENT  11/03/2001   Mitral valve replacement with #35 St. Jude mechanical mitral valve prosthesis and closure of  left atrial appendage    Social History   Tobacco Use  Smoking Status Former   Types: Cigarettes   Quit date: 05/13/1964   Years since quitting: 58.3  Smokeless Tobacco Never    Social History   Substance and Sexual Activity  Alcohol Use No    History reviewed. No pertinent family history.  Reviw of Systems:  Noted in current history.  All other systems are negative.   Physical Exam: Blood pressure 135/75, pulse 71, height 5' 11.5" (1.816 m), weight 168 lb (76.2 kg), SpO2 94 %.      GEN:  Well nourished, well developed in no acute distress HEENT: Normal NECK: No JVD; No carotid bruits LYMPHATICS: No lymphadenopathy CARDIAC: RRR . Mechanical S1,    RESPIRATORY:  Clear to auscultation without rales, wheezing or rhonchi  ABDOMEN: Soft, non-tender, non-distended MUSCULOSKELETAL:  No edema; No deformity  SKIN: Warm and dry NEUROLOGIC:  Alert and oriented x 3     EKG:   Assessment / Plan:   1. Mitral valve replacement- mitral valve repair while in [place] and had mitral valve replacement here with Dr. Tyrone Sage 2003  Overall is doing great.  Cont current meds.       2. Hyperlipidemia -   Lipids look great .   Managed by Dr. Elizabeth Sauer, MD  08/22/2022 2:31 PM    [facility] [address] [place],  [address] [place], [place]   [address] Pager [phone] Phone: [phone]; Fax: [phone]

## 2022-08-22 ENCOUNTER — Encounter: Payer: Self-pay | Admitting: Cardiovascular Disease

## 2022-08-22 ENCOUNTER — Ambulatory Visit (INDEPENDENT_AMBULATORY_CARE_PROVIDER_SITE_OTHER): Payer: Medicare Other | Admitting: *Deleted

## 2022-08-22 ENCOUNTER — Ambulatory Visit: Payer: Medicare Other | Attending: Cardiovascular Disease | Admitting: Cardiovascular Disease

## 2022-08-22 VITALS — BP 135/75 | HR 71 | Ht 71.5 in | Wt 168.0 lb

## 2022-08-22 DIAGNOSIS — Z952 Presence of prosthetic heart valve: Secondary | ICD-10-CM

## 2022-08-22 DIAGNOSIS — E785 Hyperlipidemia, unspecified: Secondary | ICD-10-CM

## 2022-08-22 DIAGNOSIS — Z5181 Encounter for therapeutic drug level monitoring: Secondary | ICD-10-CM | POA: Diagnosis not present

## 2022-08-22 DIAGNOSIS — I10 Essential (primary) hypertension: Secondary | ICD-10-CM

## 2022-08-22 LAB — POCT INR: INR: 3.3 — AB (ref 2.0–3.0)

## 2022-08-22 NOTE — Patient Instructions (Signed)
Description   Continue taking Warfarin 1 tablet daily except 1/2 tablet on Mondays, Wednesdays, and Fridays. Recheck INR in 4 weeks. Coumadin Clinic [phone]

## 2022-08-22 NOTE — Patient Instructions (Signed)
Medication Instructions:   Your physician recommends that you continue on your current medications as directed. Please refer to the Current Medication list given to you today.  *If you need a refill on your cardiac medications before your next appointment, please call your pharmacy*    Follow-Up: At [facility], you and your health needs are our priority.  As part of our continuing mission to provide you with exceptional heart care, we have created designated Provider Care Teams.  These Care Teams include your primary Cardiologist (physician) and Advanced Practice Providers (APPs -  Physician Assistants and Nurse Practitioners) who all work together to provide you with the care you need, when you need it.  We recommend signing up for the patient portal called "MyChart".  Sign up information is provided on this After Visit Summary.  MyChart is used to connect with patients for Virtual Visits (Telemedicine).  Patients are able to view lab/test results, encounter notes, upcoming appointments, etc.  Non-urgent messages can be sent to your provider as well.   To learn more about what you can do with MyChart, go to [email or web address].    Your next appointment:   1 year(s)  Provider:   Philip Nahser, MD       

## 2022-09-20 ENCOUNTER — Ambulatory Visit: Payer: Medicare Other

## 2022-09-24 ENCOUNTER — Ambulatory Visit: Payer: Medicare Other | Attending: Cardiology

## 2022-09-24 DIAGNOSIS — Z952 Presence of prosthetic heart valve: Secondary | ICD-10-CM | POA: Diagnosis not present

## 2022-09-24 DIAGNOSIS — Z5181 Encounter for therapeutic drug level monitoring: Secondary | ICD-10-CM

## 2022-09-24 LAB — POCT INR: INR: 2.9 (ref 2.0–3.0)

## 2022-09-24 NOTE — Patient Instructions (Signed)
Continue taking Warfarin 1 tablet daily except 1/2 tablet on Mondays, Wednesdays, and Fridays. Recheck INR in 6 weeks. Coumadin Clinic [phone]

## 2022-11-05 ENCOUNTER — Ambulatory Visit: Payer: Medicare Other | Attending: Cardiology | Admitting: *Deleted

## 2022-11-05 DIAGNOSIS — Z952 Presence of prosthetic heart valve: Secondary | ICD-10-CM | POA: Diagnosis not present

## 2022-11-05 DIAGNOSIS — Z5181 Encounter for therapeutic drug level monitoring: Secondary | ICD-10-CM | POA: Diagnosis not present

## 2022-11-05 LAB — POCT INR: INR: 3.1 — AB (ref 2.0–3.0)

## 2022-11-05 NOTE — Patient Instructions (Signed)
Description   Continue taking Warfarin 1 tablet daily except 1/2 tablet on Mondays, Wednesdays, and Fridays. Recheck INR in 6 weeks. Coumadin Clinic [phone]

## 2022-12-17 ENCOUNTER — Ambulatory Visit: Payer: Medicare Other | Attending: Internal Medicine

## 2022-12-17 DIAGNOSIS — Z5181 Encounter for therapeutic drug level monitoring: Secondary | ICD-10-CM

## 2022-12-17 DIAGNOSIS — Z952 Presence of prosthetic heart valve: Secondary | ICD-10-CM | POA: Diagnosis not present

## 2022-12-17 LAB — POCT INR: INR: 3.2 — AB (ref 2.0–3.0)

## 2022-12-17 NOTE — Patient Instructions (Signed)
Continue taking Warfarin 1 tablet daily except 1/2 tablet on Mondays, Wednesdays, and Fridays. Recheck INR in 6 weeks. Coumadin Clinic [phone]

## 2023-01-26 ENCOUNTER — Other Ambulatory Visit: Payer: Self-pay | Admitting: Cardiovascular Disease

## 2023-01-26 DIAGNOSIS — Z952 Presence of prosthetic heart valve: Secondary | ICD-10-CM

## 2023-01-26 DIAGNOSIS — Z7901 Long term (current) use of anticoagulants: Secondary | ICD-10-CM

## 2023-01-28 ENCOUNTER — Ambulatory Visit: Payer: Medicare Other | Attending: Cardiology | Admitting: *Deleted

## 2023-01-28 DIAGNOSIS — Z952 Presence of prosthetic heart valve: Secondary | ICD-10-CM | POA: Diagnosis not present

## 2023-01-28 DIAGNOSIS — Z5181 Encounter for therapeutic drug level monitoring: Secondary | ICD-10-CM

## 2023-01-28 LAB — POCT INR: POC INR: 2.3

## 2023-01-28 NOTE — Patient Instructions (Signed)
Description   Take 1.5 tablets of warfarin today and then continue taking Warfarin 1 tablet daily except 1/2 tablet on Mondays, Wednesdays, and Fridays. Recheck INR in 4 weeks. Coumadin Clinic [phone]

## 2023-02-17 ENCOUNTER — Telehealth: Payer: Self-pay

## 2023-02-17 NOTE — Telephone Encounter (Signed)
Pt called and stated he went to PCP today and they checked his INR which was 4.0. Pt states he was instructed to hold his Warfarin for 2 days; however, he thinks that is too much since his goal is 2.5-3.5. Advised pt to HOLD today's dose only and eat a serving of greens and then resume normal warfarin dose. Pt is scheduled to come to Coumadin Clinic on Tuesday, 02/25/23 to have INR rechecked.

## 2023-02-25 ENCOUNTER — Ambulatory Visit: Payer: Medicare Other | Attending: Cardiovascular Disease | Admitting: *Deleted

## 2023-02-25 DIAGNOSIS — Z952 Presence of prosthetic heart valve: Secondary | ICD-10-CM | POA: Diagnosis not present

## 2023-02-25 DIAGNOSIS — Z5181 Encounter for therapeutic drug level monitoring: Secondary | ICD-10-CM

## 2023-02-25 LAB — POCT INR: INR: 2.3 (ref 2.0–3.0)

## 2023-02-25 NOTE — Patient Instructions (Signed)
Description   Tomorrow take 1 tablet of warfarin and then START taking Warfarin 1 tablet daily except 1/2 tablet on Mondays and Fridays. Recheck INR in 3 weeks. Coumadin Clinic [phone]

## 2023-03-18 ENCOUNTER — Ambulatory Visit: Payer: Medicare Other | Attending: Cardiovascular Disease

## 2023-03-18 DIAGNOSIS — Z5181 Encounter for therapeutic drug level monitoring: Secondary | ICD-10-CM

## 2023-03-18 DIAGNOSIS — Z952 Presence of prosthetic heart valve: Secondary | ICD-10-CM | POA: Diagnosis not present

## 2023-03-18 LAB — POCT INR: INR: 2.9 (ref 2.0–3.0)

## 2023-03-18 NOTE — Patient Instructions (Signed)
CONTINUE taking Warfarin 1 tablet daily except 1/2 tablet on Mondays and Fridays. Recheck INR in 4 weeks. Coumadin Clinic [phone]

## 2023-04-15 ENCOUNTER — Encounter (HOSPITAL_COMMUNITY): Payer: Medicare Other

## 2023-04-15 ENCOUNTER — Ambulatory Visit: Payer: Medicare Other | Attending: Cardiology | Admitting: *Deleted

## 2023-04-15 DIAGNOSIS — Z5181 Encounter for therapeutic drug level monitoring: Secondary | ICD-10-CM | POA: Diagnosis not present

## 2023-04-15 DIAGNOSIS — Z952 Presence of prosthetic heart valve: Secondary | ICD-10-CM

## 2023-04-15 LAB — POCT INR: INR: 4.2 — AB (ref 2.0–3.0)

## 2023-04-15 NOTE — Patient Instructions (Signed)
Description   Do not take any warfarin tomorrow then continue taking Warfarin 1 tablet daily except 1/2 tablet on Mondays and Fridays. Recheck INR in 3 weeks. Coumadin Clinic [phone]

## 2023-04-17 ENCOUNTER — Other Ambulatory Visit (HOSPITAL_COMMUNITY): Payer: Self-pay | Admitting: *Deleted

## 2023-04-18 ENCOUNTER — Ambulatory Visit (HOSPITAL_COMMUNITY)
Admission: RE | Admit: 2023-04-18 | Discharge: 2023-04-18 | Disposition: A | Discharge status: Home / Self Care | Payer: Medicare Other | Source: Ambulatory Visit | Attending: Internal Medicine

## 2023-04-18 DIAGNOSIS — M810 Age-related osteoporosis without current pathological fracture: Secondary | ICD-10-CM | POA: Insufficient documentation

## 2023-04-18 MED ORDER — DENOSUMAB 60 MG/ML ~~LOC~~ SOSY
60.0000 mg | PREFILLED_SYRINGE | Freq: Once | SUBCUTANEOUS | Status: AC
Start: 2023-04-18 — End: 2023-04-18

## 2023-04-18 MED ORDER — DENOSUMAB 60 MG/ML ~~LOC~~ SOSY
PREFILLED_SYRINGE | SUBCUTANEOUS | Status: AC
  Administered 2023-04-18: 60 mg via SUBCUTANEOUS
  Filled 2023-04-18: qty 1

## 2023-05-09 ENCOUNTER — Ambulatory Visit: Payer: Medicare Other | Attending: Cardiovascular Disease

## 2023-05-09 DIAGNOSIS — Z952 Presence of prosthetic heart valve: Secondary | ICD-10-CM

## 2023-05-09 DIAGNOSIS — Z5181 Encounter for therapeutic drug level monitoring: Secondary | ICD-10-CM | POA: Diagnosis not present

## 2023-05-09 LAB — POCT INR: INR: 5.2 — AB (ref 2.0–3.0)

## 2023-05-09 NOTE — Patient Instructions (Signed)
Description   HOLD tomorrow's dose and only take 1/2 tablet on Sunday and then START taking Warfarin 1 tablet daily except 1/2 tablet on Mondays, Wednesdays, and Fridays.  Recheck INR in 2 week.  Coumadin Clinic [phone]

## 2023-05-23 ENCOUNTER — Ambulatory Visit: Payer: Medicare Other | Attending: Cardiovascular Disease

## 2023-05-23 DIAGNOSIS — Z952 Presence of prosthetic heart valve: Secondary | ICD-10-CM | POA: Diagnosis not present

## 2023-05-23 DIAGNOSIS — Z5181 Encounter for therapeutic drug level monitoring: Secondary | ICD-10-CM

## 2023-05-23 LAB — POCT INR: INR: 2.1 (ref 2.0–3.0)

## 2023-05-23 NOTE — Patient Instructions (Signed)
 Take another 0.5 tablet today only then continue taking Warfarin 1 tablet daily except 1/2 tablet on Mondays, Wednesdays, and Fridays.  Recheck INR in 4 weeks.  Coumadin Clinic [phone]

## 2023-06-20 ENCOUNTER — Ambulatory Visit: Payer: Medicare Other | Attending: Cardiology

## 2023-06-20 DIAGNOSIS — Z952 Presence of prosthetic heart valve: Secondary | ICD-10-CM

## 2023-06-20 DIAGNOSIS — Z5181 Encounter for therapeutic drug level monitoring: Secondary | ICD-10-CM | POA: Diagnosis not present

## 2023-06-20 LAB — POCT INR: INR: 2.5 (ref 2.0–3.0)

## 2023-06-20 NOTE — Patient Instructions (Signed)
 Take another 0.5 tablet today only then continue taking Warfarin 1 tablet daily except 1/2 tablet on Mondays, Wednesdays, and Fridays.  Recheck INR in 4 weeks.  Coumadin Clinic [phone]

## 2023-07-18 ENCOUNTER — Ambulatory Visit: Payer: Medicare Other | Attending: Cardiovascular Disease

## 2023-07-18 DIAGNOSIS — Z5181 Encounter for therapeutic drug level monitoring: Secondary | ICD-10-CM | POA: Diagnosis not present

## 2023-07-18 DIAGNOSIS — Z952 Presence of prosthetic heart valve: Secondary | ICD-10-CM

## 2023-07-18 LAB — POCT INR: INR: 3.1 — AB (ref 2.0–3.0)

## 2023-07-18 NOTE — Patient Instructions (Signed)
 continue taking Warfarin 1 tablet daily except 1/2 tablet on Mondays, Wednesdays, and Fridays.  Recheck INR in 6 weeks.  Coumadin Clinic [phone]

## 2023-08-24 ENCOUNTER — Encounter: Payer: Self-pay | Admitting: Cardiovascular Disease

## 2023-08-24 NOTE — Progress Notes (Unsigned)
 Terry [name]  Date of Birth  1935/07/04     Problem list: 1. Mitral valve replacement- mitral valve repair while in [place]  and had mitral valve replacement here with Dr. Nicanor Barge 2003 2. Hyperlipidemia    Notes from 2013:   Terry [name] is a day 88 year old gentleman with a history of mitral valve replacement. He has done very well. His INR Levels have been therapeutic. He has not had any problems.  He is doing very well.  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   Oct. 24, 2014:  Terry [name] has done well.  Still volunteering at the hospital.   No CP , no dyspnea.   INR levels are ok.    November 19, 2013:  Terry [name] is doing ok but has been having more palpitations associated with a very brief episode of lightheadedness. He's not had any episodes of syncope. These episodes seem to be more common in the morning and can occur when he is walking down the hall. He also has an occasionally at night when he is relaxing.  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 He will walks for exercise everyday.   02/21/2015.  Doing well. Still working  at the hospital .  [facility] 5 days a week - 3 miles  No CP or dyspnea.  No further episodes of lightheadedness.   Oct. 20, 2017:   Doing well Still working at [employer]  INR levels are great.   Jan. 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  Tries to walk every day  No CP or dyspnea.  INR is theraputic.   May 28, 2018: Terry [name] is seen today for follow-up of his mitral valve replacement.  He is done well.  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  Walks daily  INR levels have been therapeutic.  He denies any bleeding.  He does have occasional bruises.  May 28, 2019:  Terry [name] is seen for follow-up of his mitral valve replacement.  He is on Coumadin.  His INR levels have been therapeutic.  He has a  history of hyperlipidemia and is on rosuvastatin. Has had his first covid injection  Still very active .   Is on hiatus from volunteering at the hospital   Jan. 21, 2022  Terry [name] is doing ok today  INR is 4.1 today  Still volunteering at [facility] .   August 15, 2021: Terry [name] is doing well.  Hx of MV replacement in 2003 Nicanor Barge)  Still volunteers at [facility] about 3 miles day INR levels are good ,  reviewed, theraputic  August 22, 2022 Terry [name] is seen today for follow upf of his MV. Very active Getting lots of exercise Volunteers at the hospital    August 25, 2023 Terry [name] is seen for follow up of his  Mv replacement  Doing well,  still volunteering at [facility] tired quicker than he used to  Still walk several miles a day    Current Outpatient Medications on File Prior to Visit  Medication Sig Dispense Refill   acetaminophen (TYLENOL) 500 MG tablet Take 1 tablet (500 mg total) by mouth every 6 (six) hours as needed for mild pain, moderate pain or headache. 30 tablet 0   aspirin EC 81 MG tablet Take 81 mg  by mouth daily.     COSOPT PF 22.3-6.8 MG/ML SOLN ophthalmic solution Place 1 drop into both eyes 2 (two) times daily.  3   metoprolol succinate (TOPROL-XL) 50 MG 24 hr tablet Take 50 mg by mouth daily.     multivitamin (THERAGRAN) per tablet Take 1 tablet by mouth daily.     pantoprazole (PROTONIX) 40 MG tablet Take 1 tablet (40 mg total) by mouth every other day.     propranolol (INDERAL) 20 MG/5ML solution 1 tablet Orally PRN for 30 day(s)     rosuvastatin (CRESTOR) 20 MG tablet Take 20 mg by mouth daily.     warfarin (COUMADIN) 5 MG tablet TAKE 1/2 A TABLET TO 1 TABLET BY MOUTH DAILY AS DIRECTED BY THE COUMADIN CLINIC 90 tablet 1   ZIOPTAN 0.0015 % SOLN Place 1 drop into both eyes at bedtime.  3   Calcium Carbonate-Vitamin D (CALCIUM + D PO) Take 600 mg by mouth daily.     Cholecalciferol (VITAMIN D3) 1000 UNITS tablet Take 1,000 Units by mouth  daily.     No current facility-administered medications on file prior to visit.    Allergies  Allergen Reactions   Alendronate     Other Reaction(s): GERD   Aspirin     Other Reaction(s): too much bruising when added to Coumadin   Cipro [Ciprofloxacin Hcl] Other (See Comments)    Heartburn    Sulfa Drugs Cross Reactors Hives    Past Medical History:  Diagnosis Date   Benign prostatic hypertrophy    Dyslipidemia    Hyperlipidemia    Long-term (current) use of anticoagulants    Mitral valve prolapse    Osteoporosis    Ventricular tachycardia [facility])     Past Surgical History:  Procedure Laterality Date   APPENDICO-VESICOSTOMY CUTANEOUS     CARDIAC CATHETERIZATION  08/19/2001   Essentially smooth and normal coronary arteries   CORONARY STENT PLACEMENT  11/03/2001   Mitral valve replacement with #35 St. Jude mechanical mitral valve prosthesis and closure of  left atrial appendage    Social History   Tobacco Use  Smoking Status Former   Current packs/day: 0.00   Types: Cigarettes   Quit date: 05/13/1964   Years since quitting: 59.3  Smokeless Tobacco Never    Social History   Substance and Sexual Activity  Alcohol Use No    History reviewed. No pertinent family history.  Reviw of Systems:  Noted in current history.  All other systems are negative.   Physical Exam: Blood pressure 132/82, pulse 84, height 5' 11.5" (1.816 m), weight 170 lb 3.2 oz (77.2 kg), SpO2 94%.       GEN:  Well nourished, well developed in no acute distress HEENT: Normal NECK: No JVD; No carotid bruits LYMPHATICS: No lymphadenopathy CARDIAC: RRR mechanical S1 ,  RESPIRATORY:  Clear to auscultation without rales, wheezing or rhonchi  ABDOMEN: Soft, non-tender, non-distended MUSCULOSKELETAL:  No edema; No deformity  SKIN: Warm and dry NEUROLOGIC:  Alert and oriented x 3    EKG:   EKG Interpretation Date/Time:  Monday August 25 2023 13:15:00 EDT Ventricular Rate:  80 PR  Interval:  218 QRS Duration:  102 QT Interval:  410 QTC Calculation: 472 R Axis:   67  Text Interpretation: Sinus rhythm with 1st degree A-V block with Premature atrial complexes When compared with ECG of 09-Dec-2021 19:01, Premature atrial complexes are now Present PR interval has increased Vent. rate has decreased BY  39 BPM Non-specific change  in ST segment in Inferior leads Nonspecific T wave abnormality no longer evident in Inferior leads T wave amplitude has increased in Anterior leads Confirmed by Ahmad Alert ([id number]) on 08/25/2023 1:23:14 PM     Assessment / Plan:   1. Mitral valve replacement- mitral valve repair while in [place]  and had mitral valve replacement here with Dr. Nicanor Barge 2003 Overall he is doing well      2. Hyperlipidemia -   Lipids look great .   Managed by Dr. Gale Jude, MD  08/25/2023 1:23 PM    [facility] [address] [place],  [address] [place], [place]  [address] Pager [phone] Phone: [phone]; Fax: [phone]

## 2023-08-25 ENCOUNTER — Ambulatory Visit: Payer: Medicare Other | Attending: Cardiovascular Disease | Admitting: Cardiovascular Disease

## 2023-08-25 ENCOUNTER — Encounter: Payer: Self-pay | Admitting: Cardiovascular Disease

## 2023-08-25 VITALS — BP 132/82 | HR 84 | Ht 71.5 in | Wt 170.2 lb

## 2023-08-25 DIAGNOSIS — E782 Mixed hyperlipidemia: Secondary | ICD-10-CM

## 2023-08-25 DIAGNOSIS — Z952 Presence of prosthetic heart valve: Secondary | ICD-10-CM

## 2023-08-25 DIAGNOSIS — I10 Essential (primary) hypertension: Secondary | ICD-10-CM | POA: Diagnosis not present

## 2023-08-25 NOTE — Patient Instructions (Signed)
 Medication Instructions:  Your physician recommends that you continue on your current medications as directed. Please refer to the Current Medication list given to you today.  *If you need a refill on your cardiac medications before your next appointment, please call your pharmacy*  Follow-Up: At [facility], you and your health needs are our priority.  As part of our continuing mission to provide you with exceptional heart care, we have created designated Provider Care Teams.  These Care Teams include your primary Cardiologist (physician) and Advanced Practice Providers (APPs -  Physician Assistants and Nurse Practitioners) who all work together to provide you with the care you need, when you need it.  Your next appointment:   1 year(s)  The format for your next appointment:   In Person  Provider:   Kristeen Miss, MD {  Other Instructions   1st Floor: - Lobby - Registration  - Pharmacy  - Lab - Cafe  2nd Floor: - PV Lab - Diagnostic Testing (echo, CT, nuclear med)  3rd Floor: - Vacant  4th Floor: - TCTS (cardiothoracic surgery) - AFib Clinic - Structural Heart Clinic - Vascular Surgery  - Vascular Ultrasound  5th Floor: - HeartCare Cardiology (general and EP) - Clinical Pharmacy for coumadin, hypertension, lipid, weight-loss medications, and med management appointments    Valet parking services will be available as well.

## 2023-08-29 ENCOUNTER — Encounter

## 2023-08-31 ENCOUNTER — Other Ambulatory Visit: Payer: Self-pay | Admitting: Cardiovascular Disease

## 2023-08-31 DIAGNOSIS — Z7901 Long term (current) use of anticoagulants: Secondary | ICD-10-CM

## 2023-08-31 DIAGNOSIS — Z952 Presence of prosthetic heart valve: Secondary | ICD-10-CM

## 2023-09-02 ENCOUNTER — Ambulatory Visit: Attending: Cardiology | Admitting: *Deleted

## 2023-09-02 DIAGNOSIS — Z952 Presence of prosthetic heart valve: Secondary | ICD-10-CM

## 2023-09-02 DIAGNOSIS — Z5181 Encounter for therapeutic drug level monitoring: Secondary | ICD-10-CM

## 2023-09-02 LAB — POCT INR: INR: 3.8 — AB (ref 2.0–3.0)

## 2023-09-02 NOTE — Patient Instructions (Signed)
 Description   Do not take any warfarin tomorrow then continue taking Warfarin 1 tablet daily except 1/2 tablet on Mondays, Wednesdays, and Fridays.  Recheck INR in 5 weeks.  Coumadin  Clinic [phone]

## 2023-10-10 ENCOUNTER — Ambulatory Visit: Attending: Cardiovascular Disease

## 2023-10-10 DIAGNOSIS — Z5181 Encounter for therapeutic drug level monitoring: Secondary | ICD-10-CM

## 2023-10-10 DIAGNOSIS — Z952 Presence of prosthetic heart valve: Secondary | ICD-10-CM | POA: Diagnosis not present

## 2023-10-10 LAB — POCT INR: INR: 3.2 — AB (ref 2.0–3.0)

## 2023-10-10 NOTE — Patient Instructions (Signed)
Description   Continue taking Warfarin 1 tablet daily except 1/2 tablet on Mondays, Wednesdays, and Fridays. Recheck INR in 6 weeks. Coumadin Clinic [phone]

## 2023-11-21 ENCOUNTER — Ambulatory Visit: Attending: Cardiovascular Disease

## 2023-11-21 DIAGNOSIS — Z5181 Encounter for therapeutic drug level monitoring: Secondary | ICD-10-CM | POA: Diagnosis not present

## 2023-11-21 DIAGNOSIS — Z952 Presence of prosthetic heart valve: Secondary | ICD-10-CM

## 2023-11-21 LAB — POCT INR: INR: 3.4 — AB (ref 2.0–3.0)

## 2023-11-21 NOTE — Progress Notes (Signed)
Please see anticoagulation encounter.

## 2023-11-21 NOTE — Patient Instructions (Signed)
Description   Continue taking Warfarin 1 tablet daily except 1/2 tablet on Mondays, Wednesdays, and Fridays. Recheck INR in 6 weeks. Coumadin Clinic [phone]

## 2023-11-24 ENCOUNTER — Encounter: Payer: Self-pay | Admitting: Podiatry

## 2023-11-24 ENCOUNTER — Ambulatory Visit: Admitting: Podiatry

## 2023-11-24 DIAGNOSIS — D169 Benign neoplasm of bone and articular cartilage, unspecified: Secondary | ICD-10-CM

## 2023-11-24 DIAGNOSIS — L84 Corns and callosities: Secondary | ICD-10-CM

## 2023-11-24 DIAGNOSIS — D689 Coagulation defect, unspecified: Secondary | ICD-10-CM | POA: Diagnosis not present

## 2023-11-26 NOTE — Progress Notes (Signed)
 Subjective:   Patient ID: Terry [name], male   DOB: 88 y.o.   MRN: [id number]   HPI Patient presents with lesion on the inside of the fifth digit left foot that been very painful.  He was treated by family physician for an infection and that did not seem to make a lot of difference and it is difficult to wear shoe gear with your to sleep with at nighttime.  Patient does not smoke and is moderately active for age   Review of Systems  All other systems reviewed and are negative.       Objective:  Physical Exam Vitals and nursing note reviewed.  Constitutional:      Appearance: He is well-developed.  Pulmonary:     Effort: Pulmonary effort is normal.  Musculoskeletal:        General: Normal range of motion.  Skin:    General: Skin is warm.  Neurological:     Mental Status: He is alert.     Neurovascular status was found to be intact muscle strength is adequate for age range of motion moderately reduced subtalar midtarsal joint.  Patient's left fifth digit shows keratotic lesion on the inner side with moderate rotation of the toe pressing against the fourth digit.  Patient has good digital perfusion well oriented x 3 no redness no drainage noted     Assessment:  Exostosis lesion digit 5 left with reactive tissue formation with the patient who is also on blood thinner     Plan:  H&P reviewed educated him on bone spur formation.  Today Sharp debridement of area accomplished and padding applied around the digit with no drainage noted upon doing this.  I did advise to see him back when symptomatic again and it is possible we may have to do a small exostectomy of the area but I would like to avoid that if possible.  Patient will be seen back as symptoms indicate

## 2024-01-02 ENCOUNTER — Ambulatory Visit: Attending: Cardiovascular Disease

## 2024-01-02 DIAGNOSIS — Z952 Presence of prosthetic heart valve: Secondary | ICD-10-CM | POA: Diagnosis not present

## 2024-01-02 DIAGNOSIS — Z5181 Encounter for therapeutic drug level monitoring: Secondary | ICD-10-CM

## 2024-01-02 LAB — POCT INR: INR: 3.2 — AB (ref 2.0–3.0)

## 2024-01-02 NOTE — Progress Notes (Signed)
 INR 3.2 Please see anticoagulation encounter Continue taking Warfarin 1 tablet daily except 1/2 tablet on Mondays, Wednesdays, and Fridays.  Recheck INR in 6 weeks.  Coumadin  Clinic [phone]

## 2024-01-02 NOTE — Patient Instructions (Signed)
Continue taking Warfarin 1 tablet daily except 1/2 tablet on Mondays, Wednesdays, and Fridays. Recheck INR in 6 weeks. Coumadin Clinic [phone]

## 2024-02-13 ENCOUNTER — Ambulatory Visit: Attending: Cardiology

## 2024-02-13 DIAGNOSIS — Z5181 Encounter for therapeutic drug level monitoring: Secondary | ICD-10-CM | POA: Diagnosis not present

## 2024-02-13 DIAGNOSIS — Z952 Presence of prosthetic heart valve: Secondary | ICD-10-CM

## 2024-02-13 LAB — POCT INR: INR: 1.5 — AB (ref 2.0–3.0)

## 2024-02-13 NOTE — Patient Instructions (Signed)
 Take another 1 tablet today then Continue taking Warfarin 1 tablet daily except 1/2 tablet on Mondays, Wednesdays, and Fridays.  Recheck INR in 3 weeks.  Coumadin  Clinic [phone]

## 2024-02-13 NOTE — Progress Notes (Signed)
 INR 1.5 Please see anticoagulation encounter Take another 1 tablet today then Continue taking Warfarin 1 tablet daily except 1/2 tablet on Mondays, Wednesdays, and Fridays.  Recheck INR in 3 weeks.  Coumadin  Clinic [phone]

## 2024-03-05 ENCOUNTER — Ambulatory Visit: Attending: Cardiovascular Disease | Admitting: *Deleted

## 2024-03-05 DIAGNOSIS — Z952 Presence of prosthetic heart valve: Secondary | ICD-10-CM | POA: Diagnosis not present

## 2024-03-05 DIAGNOSIS — Z5181 Encounter for therapeutic drug level monitoring: Secondary | ICD-10-CM

## 2024-03-05 LAB — POCT INR: INR: 3.5 — AB (ref 2.0–3.0)

## 2024-03-05 NOTE — Progress Notes (Signed)
  Description   INR-3.5; Continue taking Warfarin 1 tablet daily except 1/2 tablet on Mondays, Wednesdays, and Fridays.  Recheck INR in 4 weeks.  Coumadin  Clinic [phone]

## 2024-03-05 NOTE — Patient Instructions (Signed)
 Description   INR-3.5; Continue taking Warfarin 1 tablet daily except 1/2 tablet on Mondays, Wednesdays, and Fridays.  Recheck INR in 4 weeks.  Coumadin  Clinic [phone]

## 2024-03-30 ENCOUNTER — Ambulatory Visit: Attending: Internal Medicine | Admitting: *Deleted

## 2024-03-30 DIAGNOSIS — Z952 Presence of prosthetic heart valve: Secondary | ICD-10-CM | POA: Diagnosis not present

## 2024-03-30 DIAGNOSIS — Z5181 Encounter for therapeutic drug level monitoring: Secondary | ICD-10-CM

## 2024-03-30 LAB — POCT INR: INR: 3.5 — AB (ref 2.0–3.0)

## 2024-03-30 NOTE — Patient Instructions (Signed)
 Description   INR-3.5; Continue taking Warfarin 1 tablet daily except 1/2 tablet on Mondays, Wednesdays, and Fridays.  Recheck INR in 5 weeks.  Coumadin  Clinic [phone]

## 2024-03-30 NOTE — Progress Notes (Signed)
 Description   INR-3.5; Continue taking Warfarin 1 tablet daily except 1/2 tablet on Mondays, Wednesdays, and Fridays.  Recheck INR in 5 weeks.  Coumadin  Clinic [phone]

## 2024-04-26 ENCOUNTER — Other Ambulatory Visit: Payer: Self-pay

## 2024-04-26 DIAGNOSIS — Z7901 Long term (current) use of anticoagulants: Secondary | ICD-10-CM

## 2024-04-26 DIAGNOSIS — Z952 Presence of prosthetic heart valve: Secondary | ICD-10-CM

## 2024-04-26 MED ORDER — WARFARIN SODIUM 5 MG PO TABS: ORAL_TABLET | ORAL | 1 refills | Status: DC

## 2024-05-04 ENCOUNTER — Ambulatory Visit

## 2024-05-10 ENCOUNTER — Ambulatory Visit: Attending: Cardiovascular Disease

## 2024-05-10 DIAGNOSIS — Z5181 Encounter for therapeutic drug level monitoring: Secondary | ICD-10-CM

## 2024-05-10 DIAGNOSIS — Z952 Presence of prosthetic heart valve: Secondary | ICD-10-CM

## 2024-05-10 LAB — POCT INR: INR: 4.3 — AB (ref 2.0–3.0)

## 2024-05-10 NOTE — Patient Instructions (Signed)
 Hold tomorrow only then Continue taking Warfarin 1 tablet daily except 1/2 tablet on Mondays, Wednesdays, and Fridays.  Recheck INR in 3 weeks.  Coumadin  Clinic [phone]

## 2024-05-10 NOTE — Progress Notes (Signed)
"   INR  4.3 Please see anticoagulation encounter Hold tomorrow only then Continue taking Warfarin 1 tablet daily except 1/2 tablet on Mondays, Wednesdays, and Fridays.  Recheck INR in 3 weeks.  Coumadin  Clinic [phone] "

## 2024-05-20 ENCOUNTER — Emergency Department (HOSPITAL_COMMUNITY)

## 2024-05-20 ENCOUNTER — Other Ambulatory Visit: Payer: Self-pay

## 2024-05-20 ENCOUNTER — Encounter (HOSPITAL_COMMUNITY): Payer: Self-pay | Admitting: Internal Medicine

## 2024-05-20 ENCOUNTER — Inpatient Hospital Stay (HOSPITAL_COMMUNITY)
Admission: EM | Admit: 2024-05-20 | Discharge: 2024-06-03 | DRG: 577 | Disposition: A | Discharge status: Skilled Nursing Facility | Attending: Internal Medicine | Admitting: Internal Medicine

## 2024-05-20 DIAGNOSIS — Z952 Presence of prosthetic heart valve: Secondary | ICD-10-CM

## 2024-05-20 DIAGNOSIS — M79A22 Nontraumatic compartment syndrome of left lower extremity: Secondary | ICD-10-CM

## 2024-05-20 DIAGNOSIS — Z604 Social exclusion and rejection: Secondary | ICD-10-CM | POA: Diagnosis present

## 2024-05-20 DIAGNOSIS — T79A22A Traumatic compartment syndrome of left lower extremity, initial encounter: Secondary | ICD-10-CM | POA: Diagnosis present

## 2024-05-20 DIAGNOSIS — R911 Solitary pulmonary nodule: Secondary | ICD-10-CM | POA: Diagnosis present

## 2024-05-20 DIAGNOSIS — S8012XA Contusion of left lower leg, initial encounter: Principal | ICD-10-CM | POA: Diagnosis present

## 2024-05-20 DIAGNOSIS — Z79899 Other long term (current) drug therapy: Secondary | ICD-10-CM

## 2024-05-20 DIAGNOSIS — Z87891 Personal history of nicotine dependence: Secondary | ICD-10-CM

## 2024-05-20 DIAGNOSIS — Z888 Allergy status to other drugs, medicaments and biological substances status: Secondary | ICD-10-CM

## 2024-05-20 DIAGNOSIS — N400 Enlarged prostate without lower urinary tract symptoms: Secondary | ICD-10-CM | POA: Diagnosis present

## 2024-05-20 DIAGNOSIS — D6832 Hemorrhagic disorder due to extrinsic circulating anticoagulants: Secondary | ICD-10-CM | POA: Diagnosis present

## 2024-05-20 DIAGNOSIS — Z7901 Long term (current) use of anticoagulants: Secondary | ICD-10-CM | POA: Diagnosis not present

## 2024-05-20 DIAGNOSIS — D75839 Thrombocytosis, unspecified: Secondary | ICD-10-CM | POA: Diagnosis present

## 2024-05-20 DIAGNOSIS — Z881 Allergy status to other antibiotic agents status: Secondary | ICD-10-CM | POA: Diagnosis not present

## 2024-05-20 DIAGNOSIS — S8012XD Contusion of left lower leg, subsequent encounter: Secondary | ICD-10-CM | POA: Diagnosis not present

## 2024-05-20 DIAGNOSIS — E785 Hyperlipidemia, unspecified: Secondary | ICD-10-CM | POA: Diagnosis present

## 2024-05-20 DIAGNOSIS — D62 Acute posthemorrhagic anemia: Secondary | ICD-10-CM | POA: Diagnosis present

## 2024-05-20 DIAGNOSIS — D638 Anemia in other chronic diseases classified elsewhere: Secondary | ICD-10-CM | POA: Diagnosis present

## 2024-05-20 DIAGNOSIS — W010XXA Fall on same level from slipping, tripping and stumbling without subsequent striking against object, initial encounter: Secondary | ICD-10-CM | POA: Diagnosis present

## 2024-05-20 DIAGNOSIS — Z886 Allergy status to analgesic agent status: Secondary | ICD-10-CM | POA: Diagnosis not present

## 2024-05-20 DIAGNOSIS — Z7982 Long term (current) use of aspirin: Secondary | ICD-10-CM

## 2024-05-20 DIAGNOSIS — T45515A Adverse effect of anticoagulants, initial encounter: Secondary | ICD-10-CM | POA: Diagnosis present

## 2024-05-20 DIAGNOSIS — I10 Essential (primary) hypertension: Secondary | ICD-10-CM | POA: Diagnosis present

## 2024-05-20 DIAGNOSIS — Z955 Presence of coronary angioplasty implant and graft: Secondary | ICD-10-CM

## 2024-05-20 DIAGNOSIS — Z882 Allergy status to sulfonamides status: Secondary | ICD-10-CM

## 2024-05-20 DIAGNOSIS — T148XXA Other injury of unspecified body region, initial encounter: Secondary | ICD-10-CM | POA: Diagnosis not present

## 2024-05-20 LAB — COMPREHENSIVE METABOLIC PANEL WITH GFR
ALT: 69 U/L — ABNORMAL HIGH (ref 0–44)
AST: 43 U/L — ABNORMAL HIGH (ref 15–41)
Albumin: 3.7 g/dL (ref 3.5–5.0)
Alkaline Phosphatase: 65 U/L (ref 38–126)
Anion gap: 9 (ref 5–15)
BUN: 24 mg/dL — ABNORMAL HIGH (ref 8–23)
CO2: 26 mmol/L (ref 22–32)
Calcium: 8.8 mg/dL — ABNORMAL LOW (ref 8.9–10.3)
Chloride: 104 mmol/L (ref 98–111)
Creatinine, Ser: 0.87 mg/dL (ref 0.61–1.24)
GFR, Estimated: >60 mL/min
Glucose, Bld: 128 mg/dL — ABNORMAL HIGH (ref 70–99)
Potassium: 3.9 mmol/L (ref 3.5–5.1)
Sodium: 139 mmol/L (ref 135–145)
Total Bilirubin: 0.7 mg/dL (ref 0.0–1.2)
Total Protein: 7.1 g/dL (ref 6.5–8.1)

## 2024-05-20 LAB — CBC WITH DIFFERENTIAL/PLATELET
Abs Immature Granulocytes: 0.04 K/uL (ref 0.00–0.07)
Basophils Absolute: 0 K/uL (ref 0.0–0.1)
Basophils Relative: 0 %
Eosinophils Absolute: 0.2 K/uL (ref 0.0–0.5)
Eosinophils Relative: 2 %
HCT: 36.8 % — ABNORMAL LOW (ref 39.0–52.0)
Hemoglobin: 11.6 g/dL — ABNORMAL LOW (ref 13.0–17.0)
Immature Granulocytes: 0 %
Lymphocytes Relative: 6 %
Lymphs Abs: 0.5 K/uL — ABNORMAL LOW (ref 0.7–4.0)
MCH: 31.5 pg (ref 26.0–34.0)
MCHC: 31.5 g/dL (ref 30.0–36.0)
MCV: 100 fL (ref 80.0–100.0)
Monocytes Absolute: 1.1 K/uL — ABNORMAL HIGH (ref 0.1–1.0)
Monocytes Relative: 12 %
Neutro Abs: 7.4 K/uL (ref 1.7–7.7)
Neutrophils Relative %: 80 %
Platelets: 317 K/uL (ref 150–400)
RBC: 3.68 MIL/uL — ABNORMAL LOW (ref 4.22–5.81)
RDW: 13.6 % (ref 11.5–15.5)
WBC: 9.2 K/uL (ref 4.0–10.5)
nRBC: 0 % (ref 0.0–0.2)

## 2024-05-20 LAB — PROTIME-INR
INR: 3.1 — ABNORMAL HIGH (ref 0.8–1.2)
INR: 3.3 — ABNORMAL HIGH (ref 0.8–1.2)
Prothrombin Time: 33.6 s — ABNORMAL HIGH (ref 11.4–15.2)
Prothrombin Time: 35.4 s — ABNORMAL HIGH (ref 11.4–15.2)

## 2024-05-20 LAB — I-STAT CG4 LACTIC ACID, ED: Lactic Acid, Venous: 1.6 mmol/L (ref 0.5–1.9)

## 2024-05-20 MED ORDER — MORPHINE SULFATE (PF) 2 MG/ML IV SOLN
2.0000 mg | INTRAVENOUS | Status: DC | PRN
  Administered 2024-05-20 – 2024-05-23 (×9): 2 mg via INTRAVENOUS
  Filled 2024-05-20 (×9): qty 1

## 2024-05-20 MED ORDER — HYDROMORPHONE HCL 1 MG/ML IJ SOLN
0.5000 mg | Freq: Once | INTRAMUSCULAR | Status: AC
  Administered 2024-05-20: 0.5 mg via INTRAVENOUS
  Filled 2024-05-20: qty 1

## 2024-05-20 MED ORDER — ACETAMINOPHEN 500 MG PO TABS
500.0000 mg | ORAL_TABLET | Freq: Four times a day (QID) | ORAL | Status: DC | PRN
  Administered 2024-05-22 – 2024-05-23 (×2): 500 mg via ORAL
  Filled 2024-05-20 (×2): qty 1

## 2024-05-20 MED ORDER — PIPERACILLIN-TAZOBACTAM 3.375 G IVPB 30 MIN
3.3750 g | Freq: Once | INTRAVENOUS | Status: AC
  Administered 2024-05-20: 3.375 g via INTRAVENOUS
  Filled 2024-05-20: qty 50

## 2024-05-20 MED ORDER — SODIUM CHLORIDE 0.9% IV SOLUTION: Freq: Once | INTRAVENOUS | Status: AC

## 2024-05-20 MED ORDER — METRONIDAZOLE 500 MG/100ML IV SOLN: 500.0000 mg | Freq: Once | INTRAVENOUS | Status: DC

## 2024-05-20 MED ORDER — VANCOMYCIN HCL 1500 MG/300ML IV SOLN
1500.0000 mg | Freq: Once | INTRAVENOUS | Status: AC
  Administered 2024-05-20: 1500 mg via INTRAVENOUS
  Filled 2024-05-20: qty 300

## 2024-05-20 MED ORDER — DIAZEPAM 5 MG/ML IJ SOLN
2.5000 mg | Freq: Once | INTRAMUSCULAR | Status: AC
  Administered 2024-05-20: 2.5 mg via INTRAVENOUS
  Filled 2024-05-20: qty 2

## 2024-05-20 MED ORDER — LIDOCAINE 5 % EX PTCH
1.0000 | MEDICATED_PATCH | CUTANEOUS | Status: DC
  Administered 2024-05-20 – 2024-05-28 (×9): 1 via TRANSDERMAL
  Filled 2024-05-20 (×8): qty 1

## 2024-05-20 MED ORDER — ONDANSETRON HCL 4 MG PO TABS: 4.0000 mg | ORAL_TABLET | Freq: Four times a day (QID) | ORAL | Status: DC | PRN

## 2024-05-20 MED ORDER — PHYTONADIONE 5 MG PO TABS
2.5000 mg | ORAL_TABLET | Freq: Once | ORAL | Status: AC
  Administered 2024-05-20: 2.5 mg via ORAL
  Filled 2024-05-20: qty 1

## 2024-05-20 MED ORDER — PIPERACILLIN-TAZOBACTAM 3.375 G IVPB
3.3750 g | Freq: Three times a day (TID) | INTRAVENOUS | Status: DC
  Administered 2024-05-21 – 2024-05-24 (×11): 3.375 g via INTRAVENOUS
  Filled 2024-05-20 (×11): qty 50

## 2024-05-20 MED ORDER — IOHEXOL 350 MG/ML SOLN
100.0000 mL | Freq: Once | INTRAVENOUS | Status: AC | PRN
  Administered 2024-05-20: 100 mL via INTRAVENOUS

## 2024-05-20 MED ORDER — SODIUM CHLORIDE 0.45 % IV SOLN: INTRAVENOUS | Status: AC

## 2024-05-20 MED ORDER — ONDANSETRON HCL 4 MG/2ML IJ SOLN
4.0000 mg | Freq: Four times a day (QID) | INTRAMUSCULAR | Status: DC | PRN
  Administered 2024-05-28: 4 mg via INTRAVENOUS

## 2024-05-20 MED ORDER — PANTOPRAZOLE SODIUM 40 MG PO TBEC
40.0000 mg | DELAYED_RELEASE_TABLET | ORAL | Status: DC
  Administered 2024-05-20 – 2024-06-03 (×7): 40 mg via ORAL
  Filled 2024-05-20 (×10): qty 1

## 2024-05-20 MED ORDER — FENTANYL CITRATE (PF) 50 MCG/ML IJ SOSY
50.0000 ug | PREFILLED_SYRINGE | Freq: Once | INTRAMUSCULAR | Status: AC
  Administered 2024-05-20: 50 ug via INTRAVENOUS
  Filled 2024-05-20: qty 1

## 2024-05-20 NOTE — Progress Notes (Signed)
 ED Pharmacy Antibiotic Sign Off An antibiotic consult was received from an ED provider for vancomycin  per pharmacy dosing for wound infection. A chart review was completed to assess appropriateness.  Zosyn  3.375g q8h placed by ED MD.  The following one time order(s) were placed per pharmacy consult:  vancomycin  1500 mg x 1 dose  Further antibiotic and/or antibiotic pharmacy consults should be ordered by the admitting provider if indicated.   Thank you for allowing pharmacy to be a part of this patient's care.   Dorn Buttner, PharmD, BCPS 05/20/2024 6:22 PM ED Clinical Pharmacist -  [phone]

## 2024-05-20 NOTE — Progress Notes (Signed)
 ANTICOAGULATION CONSULT NOTE  Pharmacy Consult for Heparin  Indication: mechanical mitral valve  Allergies[1]  Patient Measurements:    Vital Signs: Temp: 99.2 F (37.3 C) (01/08 1644) Temp Source: Axillary (01/08 1644) BP: 153/85 (01/08 1845) Pulse Rate: 112 (01/08 1845)  Labs: Recent Labs    05/20/24 1608  HGB 11.6*  HCT 36.8*  PLT 317  LABPROT 33.6*  INR 3.1*  CREATININE 0.87    CrCl cannot be calculated (Unknown ideal weight.).   Medical History: Past Medical History:  Diagnosis Date   Benign prostatic hypertrophy    Dyslipidemia    Hyperlipidemia    Long-term (current) use of anticoagulants    Mitral valve prolapse    Osteoporosis    Ventricular tachycardia (HCC)     Medications:  (Not in a hospital admission)  Scheduled:   lidocaine   1 patch Transdermal Q24H   Infusions:   [START ON 05/21/2024] piperacillin -tazobactam     vancomycin  1,500 mg (05/20/24 1935)   PRN:   Assessment: 88 yom with a history of mechanical mitral valve. Patient is presenting with swelling from a hematoma following a fall on 05/06/24. Heparin  per pharmacy consult placed for mechanical mitral valve.  CT imaging with small focus of active extravasation.  Patient is is on warfarin prior to arrival. Uncertain of last dose time at this moment.  Warfarin dose per last [facility] visit notes: 2.5 mg (5 mg x 0.5) every Mon, Wed, Fri; 5 mg (5 mg x 1) all other days. INR goal 2.5-3.5.  Hgb 11.6; plt 317 PT/INR 33.6/3.1  Goal of Therapy:  Heparin  level 0.3-0.7 units/ml Monitor platelets by anticoagulation protocol: Yes   Plan:  Warfarin: Discussed risks / benefits of rapid, emergent reversal of patient's warfarin given mechanical valve status with ED provider. ED MD has also discussed with ortho and cardiology. For now, plan is to give PO vitamin K and reserve the option to emergently treat with IV Vitamin K + Kcentra should extravasation worsen or emergent need for surgical  intervention arises. If emergent reversal should be required, recommend 1500 units Kcentra IV x 1 + 10mg  IV Vitamin K.  Heparin  Monitor INR Begin IV heparin  once INR < 2.5   Dorn Buttner, PharmD, BCPS 05/20/2024 7:53 PM ED Clinical Pharmacist -  [phone]       [1]  Allergies Allergen Reactions   Alendronate Other (See Comments)    GERD   Aspirin     too much bruising when added to Coumadin    Cipro [Ciprofloxacin Hcl] Other (See Comments)    Heartburn    Sulfa Drugs Cross Reactors Hives

## 2024-05-20 NOTE — ED Notes (Addendum)
 Ortho arrived at 1905 to assess the patient. Pt leg pressure wrapped and elevated. Ice applied.

## 2024-05-20 NOTE — ED Notes (Signed)
 Pt is screaming in pain, Schlossman, MD notified.

## 2024-05-20 NOTE — Progress Notes (Signed)
 "  Subjective:  89 y.o. male with left lower extremity swelling with evidence on CTA of a subcutaneous bleed of the anterolateral lower leg. Patient reports that he has spasms, but that elevation and ice has helped his symptomatically.  He denies any numbness or tingling.   Objective:   VITALS:   Vitals:   05/22/24 1817 05/22/24 1817 05/22/24 2111 05/23/24 0549  BP: 121/65 121/65 (!) 122/58 (!) 140/66  Pulse: (!) 102 (!) 102 87 98  Resp: 17 18 17 17   Temp: 97.9 F (36.6 C) 97.9 F (36.6 C) 97.8 F (36.6 C) (!) 97.4 F (36.3 C)  TempSrc: Oral  Oral Oral  SpO2: 95% 95% 94% 95%  Weight:      Height:        Physical Exam: General: Alert, no acute distress Cardiovascular: No pedal edema Respiratory: No cyanosis, no use of accessory musculature GI: No organomegaly, abdomen is soft and non-tender Skin: No lesions in the area of chief complaint Neurologic: Sensation intact distally Psychiatric: Patient is competent for consent with normal mood and affect Lymphatic: No axillary or cervical lymphadenopathy  MUSCULOSKELETAL: Left lower extremity - Skin with swelling and wound of the anterolateral tibia in the proximal third with ecchymosis and early blistering of the anterolateral calf extending just past the middle third of the tibia and to the posterolateral calf. - TTP anterolateral and posterolateral calf - Pain with active dorsiflexion/plantarflexion - no pain with passive stretch of ankle dorsiflexion/plantarflexion, or throughout knee ROM - tissues full, but compressible - Motor intact to ankle dorsiflexion/plantarflexion, able to flex and extend toes without pain - Sensation intact to light touch sural, saphenous, tibial, deep and superficial peroneal nerve distributions - 2+ DP and PT pulse  Lab Results  Component Value Date   WBC 16.7 (H) 05/23/2024   HGB 8.4 (L) 05/23/2024   HCT 25.4 (L) 05/23/2024   MCV 96.2 05/23/2024   PLT 271 05/23/2024   BMET    Component  Value Date/Time   NA 133 (L) 05/22/2024 0813   K 4.0 05/22/2024 0813   CL 100 05/22/2024 0813   CO2 23 05/22/2024 0813   GLUCOSE 113 (H) 05/22/2024 0813   BUN 19 05/22/2024 0813   CREATININE 0.73 05/22/2024 0813   CALCIUM  8.3 (L) 05/22/2024 0813   GFRNONAA >60 05/22/2024 0813     Assessment/Plan: 89 y.o. male  with left spontaneous anterolateral subcutaneous calf bleed with early blistering and the following documented hospital problems: Principal Problem:   Hematoma Active Problems:   H/O mitral valve replacement with mechanical valve   Chronic anticoagulation   Acute blood loss anemia   Essential hypertension   Hyperlipidemia The patient continues to have a reassuring exam 4 hours s/p last examination.  He has a CTA which demonstrates a subcutaneous and extra fascial spontaneous bleed.  Discussion was had with internal medicine and emergency room physician that reversal of his coumadin  is indicated in the setting of a spontaneous bleed.  He unfortunately has a mechanical heart valve and does require some anticoagulation, will plan to switch to heparin  at the lowest allowable dosage.  Recommend continued ice and elevation of the extremity above the level of the heart. No concern for compartment syndrome at this time given no pain with passive stretch  Weight bearing: as tolerated Ice and elevated the extremity above the level of the heart Coumadin  reversal Pain control: oxy and tylenol , consider muscle relaxant due to spasms on exam PT/OT Bowel regimen: docusate Will continue to  monitor with supportive measures, no plan for surgical intervention at this time.  There would be considerable risk of infection and morbidity due to the presence of blistering on exam currently with any surgical incisions. Dispo: pending swelling improvement   Rankin LELON Pizza 05/23/2024, 3:21 PM  "

## 2024-05-20 NOTE — H&P (Signed)
 " History and Physical    Patient: Terry [name] FMW:[id number] DOB: 04-Feb-1936 DOA: 05/20/2024 DOS: the patient was seen and examined on 05/20/2024 PCP: Shayne Anes, MD  Patient coming from: Home  Chief Complaint:  Chief Complaint  Patient presents with   Leg Injury   HPI: Terry [name] is a 89 y.o. male with medical history significant of chronic anticoagulation secondary to mitral valve replacement with mechanical valve, essential hypertension, hyperlipidemia, history of BPH, who apparently bumped his left leg on Christmas Day when he hit the flowerpot.  He bruised his left face and then left shin.  Patient has been having intermittent spasm and pain that is getting worse.  Came to the ER where his left lower extremity is swollen.  The leg is tender and warm.  He was found to have significant hematoma.  Patient was evaluated by orthopedics for possible compartmental syndrome but ruled out.  Continues to have severe pain as high as 9 out of 10.  At this point recommendation is to admit the patient for pain management.  Orthopedics to follow.  No fractures.  Review of Systems: As mentioned in the history of present illness. All other systems reviewed and are negative. Past Medical History:  Diagnosis Date   Benign prostatic hypertrophy    Dyslipidemia    Hyperlipidemia    Long-term (current) use of anticoagulants    Mitral valve prolapse    Osteoporosis    Ventricular tachycardia [facility])    Past Surgical History:  Procedure Laterality Date   APPENDICO-VESICOSTOMY CUTANEOUS     CARDIAC CATHETERIZATION  08/19/2001   Essentially smooth and normal coronary arteries   CORONARY STENT PLACEMENT  11/03/2001   Mitral valve replacement with #35 [employer] mechanical mitral valve prosthesis and closure of  left atrial appendage   Social History:  reports that he quit smoking about 60 years ago. His smoking use included cigarettes. He has never used smokeless tobacco. He reports that he does  not drink alcohol and does not use drugs.  Allergies[1]  No family history on file.  Prior to Admission medications  Medication Sig Start Date End Date Taking? Authorizing Provider  acetaminophen  (TYLENOL ) 500 MG tablet Take 1 tablet (500 mg total) by mouth every 6 (six) hours as needed for mild pain, moderate pain or headache. 12/24/21   Elgergawy, Brayton RAMAN, MD  aspirin EC 81 MG tablet Take 81 mg by mouth daily.    [provider]  COSOPT  PF 22.3-6.8 MG/ML SOLN ophthalmic solution Place 1 drop into both eyes 2 (two) times daily. 04/25/17   [provider]  metoprolol  succinate (TOPROL -XL) 50 MG 24 hr tablet Take 50 mg by mouth daily.    [provider]  multivitamin [facility]) per tablet Take 1 tablet by mouth daily.    [provider]  pantoprazole  (PROTONIX ) 40 MG tablet Take 1 tablet (40 mg total) by mouth every other day. 12/25/21   Elgergawy, Dawood S, MD  propranolol (INDERAL) 20 MG/5ML solution 1 tablet Orally PRN for 30 day(s)    [provider]  rosuvastatin  (CRESTOR ) 20 MG tablet Take 20 mg by mouth daily.    [provider]  warfarin (COUMADIN ) 5 MG tablet Take 1/2 a tablet to 1 tablet by mouth daily as directed by the coumadin  clinic 04/26/24   Anner Alm ORN, MD  ZIOPTAN  0.0015 % SOLN Place 1 drop into both eyes at bedtime. 04/24/17   [provider]    Physical Exam: Vitals:  05/20/24 1800 05/20/24 1815 05/20/24 1830 05/20/24 1845  BP: (!) 175/83 (!) 150/88 (!) 154/86 (!) 153/85  Pulse: (!) 117 (!) 115 (!) 115 (!) 112  Resp: 19 19 (!) 21 20  Temp:      TempSrc:      SpO2: 97% 95% 97% 98%   Constitutional: NAD, calm, comfortable Eyes: PERRL, lids and conjunctivae normal ENMT: Mucous membranes are moist. Posterior pharynx clear of any exudate or lesions.Normal dentition.  Neck: normal, supple, no masses, no thyromegaly Respiratory: clear to auscultation bilaterally, no wheezing, no crackles. Normal  respiratory effort. No accessory muscle use.  Cardiovascular: Regular rate and rhythm, no murmurs / rubs / gallops. No extremity edema. 2+ pedal pulses. No carotid bruits.  Abdomen: no tenderness, no masses palpated. No hepatosplenomegaly. Bowel sounds positive.  Musculoskeletal: Good range of motion, no joint swelling or tenderness, Skin: no rashes, lesions, ulcers. No induration Neurologic: CN 2-12 grossly intact. Sensation intact, DTR normal. Strength 5/5 in all 4.  Psychiatric: Normal judgment and insight. Alert and oriented x 3. Normal mood  Data Reviewed:  Temperature 99.2, blood pressure 175/83, pulse 117 respiratory 21 oxygen sats 95% on room air.  Hemoglobin 11.6 BUN 29 INR 3.1 glucose 128 calcium  8.8. Lactic acid 1.6.  CT angio [facility] aorta by femoral with contrast showed left anterior mid tibia subcutaneous hematoma with a small focus of active extravasation and no definite feeder vessel identified.  There is edema throughout the bilateral lower extremities below the level of the knees greater on the left solid 6 mm right middle lobe pulmonary nodule.  Assessment and Plan:  #1 left lower extremity hematoma: This was traumatic.  It followed patient's hitting his leg.  Patient has supratherapeutic INR.  Orthopedics is consulted.  The injury happened days ago.  No evidence of compartment syndrome per orthopedic will defer decision to orthopedics.  Fluid aspirated.  Sent for cultures.  #2 supratherapeutic INR: Patient is status post mechanical valve replacement and requires warfarin.  As a result patient will be treated for his symptoms but will hold warfarin without reversal it.  Ultimately resume anticoagulation as soon as cleared by orthopedics.  #3 essential hypertension: Continue with blood pressure monitoring and treatment.  #4 history of mitral valve replacement: On chronic anticoagulation.  Continue as per above  #5 hyperlipidemia: Continue statin  #6 acute blood loss anemia:  Continue to monitor H&H    Advance Care Planning:   Code Status: Prior full code  Consults: Dr. Rankin Pizza, orthopedics  Family Communication: Wife over the phone  Severity of Illness: The appropriate patient status for this patient is INPATIENT. Inpatient status is judged to be reasonable and necessary in order to provide the required intensity of service to ensure the patient's safety. The patient's presenting symptoms, physical exam findings, and initial radiographic and laboratory data in the context of their chronic comorbidities is felt to place them at high risk for further clinical deterioration. Furthermore, it is not anticipated that the patient will be medically stable for discharge from the hospital within 2 midnights of admission.   * I certify that at the point of admission it is my clinical judgment that the patient will require inpatient hospital care spanning beyond 2 midnights from the point of admission due to high intensity of service, high risk for further deterioration and high frequency of surveillance required.*  AuthorBETHA SIM KNOLL, MD 05/20/2024 7:31 PM  For on call review [email or web address].      [1]  Allergies Allergen Reactions  Alendronate     Other Reaction(s): GERD   Aspirin     Other Reaction(s): too much bruising when added to Coumadin    Cipro [Ciprofloxacin Hcl] Other (See Comments)    Heartburn    Sulfa Drugs Cross Reactors Hives   "

## 2024-05-20 NOTE — ED Provider Notes (Signed)
 " [place] EMERGENCY DEPARTMENT AT [facility] Provider Note   CSN: [id number] Arrival date & time: 05/20/24  1510     Patient presents with: Leg Injury   Terry [name] is a 89 y.o. male.   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  He reports he tripped over something on 12/25 hurting his left shin.  He also reports he hit the left side of his face but denies any headache, head trauma, LOC, nausea, vomiting.  No other injuries from the fall.  Reports that his leg was feeling okay until suddenly today he had increasing pain to that area of his left shin, describes it as a stabbing pain, very severe, 10 out of 10 and new today.  He denies any fevers or other systemic symptoms.  He does not does not have chest pain or shortness of breath.    Past Medical History:  Diagnosis Date   Benign prostatic hypertrophy    Dyslipidemia    Hyperlipidemia    Long-term (current) use of anticoagulants    Mitral valve prolapse    Osteoporosis    Ventricular tachycardia (HCC)      Prior to Admission medications  Medication Sig Start Date End Date Taking? Authorizing Provider  acetaminophen  (TYLENOL ) 500 MG tablet Take 1 tablet (500 mg total) by mouth every 6 (six) hours as needed for mild pain, moderate pain or headache. Patient taking differently: Take 1,000 mg by mouth 2 (two) times daily as needed for mild pain (pain score 1-3), moderate pain (pain score 4-6) or headache. 12/24/21  Yes Elgergawy, Brayton RAMAN, MD  aspirin EC 81 MG tablet Take 81 mg by mouth daily.   Yes [provider]  dorzolamide -timolol  (COSOPT ) 2-0.5 % ophthalmic solution Place 1 drop into both eyes 2 (two) times daily.   Yes [provider]  metoprolol  succinate (TOPROL -XL) 50 MG 24 hr tablet Take 50 mg by mouth daily.   Yes [provider]  multivitamin [facility])  per tablet Take 1 tablet by mouth daily.   Yes [provider]  ofloxacin (FLOXIN) 0.3 % OTIC solution Place 4 drops into the right ear 2 (two) times daily. 12/05/23  Yes [provider]  omeprazole (PRILOSEC) 20 MG capsule Take 20 mg by mouth every other day.   Yes [provider]  rosuvastatin  (CRESTOR ) 20 MG tablet Take 20 mg by mouth daily.   Yes [provider]  Tafluprost , PF, 0.0015 % SOLN Place 1 drop into both eyes at bedtime.   Yes [provider]  warfarin (COUMADIN ) 5 MG tablet Take 1/2 a tablet to 1 tablet by mouth daily as directed by the coumadin  clinic Patient taking differently: Take 2.5-5 mg by mouth in the morning. Take 2.5 mg (half tablet) Mon/Wed/Fri and take 5 mg (1 tablet) Tues/Thurs/Sat/Sun or as directed by anticoagulation clinic 04/26/24  Yes Anner Alm ORN, MD    Allergies: Alendronate, Aspirin, Cipro [ciprofloxacin hcl], and Sulfa drugs cross reactors    Review of Systems  Updated Vital Signs BP (!) 141/85   Pulse (!) 110   Temp (!) 97.5 F (36.4 C) (Oral)   Resp 17   Ht 5' 11 (1.803 m)   Wt 85.1 kg   SpO2 97%   BMI 26.17 kg/m   Physical Exam Vitals and nursing note reviewed.  Constitutional:  General: He is in acute distress.     Appearance: He is well-developed. He is not diaphoretic.  HENT:     Head: Normocephalic and atraumatic.  Eyes:     Conjunctiva/sclera: Conjunctivae normal.  Cardiovascular:     Rate and Rhythm: Normal rate and regular rhythm.     Heart sounds: Normal heart sounds. No murmur heard.    No friction rub. No gallop.  Pulmonary:     Effort: Pulmonary effort is normal. No respiratory distress.     Breath sounds: Normal breath sounds. No wheezing or rales.  Abdominal:     General: There is no distension.     Palpations: Abdomen is soft.     Tenderness: There is no abdominal tenderness. There is no guarding.  Musculoskeletal:        General: Swelling and tenderness (severe  tenderness left shin) present.     Cervical back: Normal range of motion.  Skin:    General: Skin is warm and dry.     Findings: Erythema (left lower leg with erythema, 3cm central area appears necrotic) present.  Neurological:     Mental Status: He is alert and oriented to person, place, and time.     (all labs ordered are listed, but only abnormal results are displayed) Labs Reviewed  CBC WITH DIFFERENTIAL/PLATELET - Abnormal; Notable for the following components:      Result Value   RBC 3.68 (*)    Hemoglobin 11.6 (*)    HCT 36.8 (*)    Lymphs Abs 0.5 (*)    Monocytes Absolute 1.1 (*)    All other components within normal limits  COMPREHENSIVE METABOLIC PANEL WITH GFR - Abnormal; Notable for the following components:   Glucose, Bld 128 (*)    BUN 24 (*)    Calcium  8.8 (*)    AST 43 (*)    ALT 69 (*)    All other components within normal limits  PROTIME-INR - Abnormal; Notable for the following components:   Prothrombin Time 33.6 (*)    INR 3.1 (*)    All other components within normal limits  PROTIME-INR - Abnormal; Notable for the following components:   Prothrombin Time 26.1 (*)    INR 2.3 (*)    All other components within normal limits  CBC - Abnormal; Notable for the following components:   WBC 13.7 (*)    RBC 3.00 (*)    Hemoglobin 9.7 (*)    HCT 29.9 (*)    All other components within normal limits  COMPREHENSIVE METABOLIC PANEL WITH GFR - Abnormal; Notable for the following components:   Glucose, Bld 124 (*)    Calcium  8.7 (*)    AST 51 (*)    ALT 58 (*)    All other components within normal limits  PROTIME-INR - Abnormal; Notable for the following components:   Prothrombin Time 35.4 (*)    INR 3.3 (*)    All other components within normal limits  CULTURE, BLOOD (ROUTINE X 2)  CULTURE, BLOOD (ROUTINE X 2)  AEROBIC CULTURE W GRAM STAIN (SUPERFICIAL SPECIMEN)  I-STAT CG4 LACTIC ACID, ED  PREPARE FRESH FROZEN PLASMA  TYPE AND SCREEN     EKG: None  Radiology: CT Angio Aortobifemoral W and/or Wo Contrast Result Date: 05/20/2024 EXAM: CTA ABDOMEN AND PELVIS WITH CONTRAST AND RUNOFF CTA OF THE LOWER EXTREMITIES WITH CONTRAST 05/20/2024 05:13:59 PM TECHNIQUE: CTA images of the abdomen, pelvis and lower extremities with intravenous contrast. 100 mL of iohexol  (OMNIPAQUE )  350 MG/ML injection was administered. Three-dimensional MIP/volume rendered formations were performed. Automated exposure control, iterative reconstruction, and/or weight based adjustment of the mA/kV was utilized to reduce the radiation dose to as low as reasonably achievable. COMPARISON: CT virtual colonoscopy 11/01/2021. CLINICAL HISTORY: Claudication or leg ischemia; eval hematoma, ?active extrav LLE hematoma, abscess. FINDINGS: VASCULATURE: AORTA: Atherosclerotic calcifications of the aorta. No acute finding. No abdominal aortic aneurysm. No dissection. CELIAC TRUNK: No acute finding. No occlusion or significant stenosis. SUPERIOR MESENTERIC ARTERY: No acute finding. No occlusion or significant stenosis. INFERIOR MESENTERIC ARTERY: No acute finding. No occlusion or significant stenosis. RENAL ARTERIES: No acute finding. No occlusion or significant stenosis. RIGHT ILIAC ARTERIES: Atherosclerotic calcifications of the common iliac arteries. No acute finding. No occlusion or significant stenosis. RIGHT FEMORAL ARTERIES: Atherosclerotic calcifications of the superficial femoral artery. No acute finding. No occlusion or significant stenosis. RIGHT POPLITEAL ARTERY: No acute finding. No occlusion or significant stenosis. RIGHT CALF ARTERIES: Atherosclerotic calcifications of calf vessels. No acute finding. No occlusion or significant stenosis. LEFT ILIAC ARTERIES: Atherosclerotic calcifications of the common iliac arteries. No acute finding. No occlusion or significant stenosis. LEFT FEMORAL ARTERIES: Atherosclerotic calcifications of the superficial femoral artery. No acute  finding. No occlusion or significant stenosis. LEFT POPLITEAL ARTERY: No acute finding. No occlusion or significant stenosis. LEFT CALF ARTERIES: Atherosclerotic calcifications of calf vessels. No acute finding. No occlusion or significant stenosis. ABDOMEN AND PELVIS: LOWER CHEST: There is a 6 mm right middle lobe nodule. LIVER: The liver is unremarkable. GALLBLADDER AND BILE DUCTS: Gallbladder is unremarkable. No biliary ductal dilatation. SPLEEN: The spleen is unremarkable. PANCREAS: The pancreas is unremarkable. ADRENAL GLANDS: Bilateral adrenal glands demonstrate no acute abnormality. KIDNEYS, URETERS AND BLADDER: No stones in the kidneys or ureters. No hydronephrosis. No evidence of perinephric or periureteral stranding. Urinary bladder is unremarkable. GI AND Bowel: Stomach and duodenal sweep demonstrate no acute abnormality. There is sigmoid colon diverticulosis. The appendix is not visualized. There is no bowel obstruction. No abnormal bowel wall thickening or distension. REPRODUCTIVE: Prostate gland is enlarged measuring 6.9 cm in transverse dimension. PERITONEUM AND RETROPERITONEUM: No ascites or free air. LYMPH NODES: Mildly enlarged left inguinal lymph nodes measuring up to 11 mm. BONES AND SOFT TISSUES: There is edema throughout the bilateral lower extremities below the level of the knees including feet and ankles, greater on the left. There is a subcutaneous collection anterior to the mid tibia measuring 2.9 x 5.9 x 12.7 cm with a hyperdense fluid level. There is a small amount of active extravasation identified in the mid and lower portions of the collection without a definitive feeder vessel seen. There is no soft tissue gas or foreign body identified. There is no acute osseous abnormality. IMPRESSION: 1. Left anterior mid tibial subcutaneous hematoma with a small focus of active extravasation and no definite feeder vessel identified. 2. Edema throughout the bilateral lower extremities below the  level of the knees, greater on the left. 3. Atherosclerotic calcifications of the aorta, common iliac arteries, calf vessels, and superficial femoral artery bilaterally. 4. Solid 6 mm right middle lobe pulmonary nodule, recommend noncontrast chest CT at 6-12 months per Fleischner Society Guidelines. Electronically signed by: Greig Pique MD MD 05/20/2024 06:04 PM EST RP Workstation: [id number]     Procedures   Medications Ordered in the ED  lidocaine  (LIDODERM ) 5 % 1 patch (1 patch Transdermal Patch Removed 05/21/24 0454)  piperacillin -tazobactam (ZOSYN ) IVPB 3.375 g (0 g Intravenous Stopped 05/20/24 1917)    Followed by  piperacillin -tazobactam (  ZOSYN ) IVPB 3.375 g (3.375 g Intravenous New Bag/Given 05/21/24 0934)  acetaminophen  (TYLENOL ) tablet 500 mg (has no administration in time range)  pantoprazole  (PROTONIX ) EC tablet 40 mg (40 mg Oral Given 05/20/24 2151)  0.45 % sodium chloride  infusion ( Intravenous New Bag/Given 05/20/24 2152)  morphine  (PF) 2 MG/ML injection 2 mg (2 mg Intravenous Given 05/21/24 1138)  ondansetron  (ZOFRAN ) tablet 4 mg (has no administration in time range)    Or  ondansetron  (ZOFRAN ) injection 4 mg (has no administration in time range)  methocarbamol  (ROBAXIN ) tablet 500 mg (500 mg Oral Given 05/21/24 1030)  metoprolol  succinate (TOPROL -XL) 24 hr tablet 50 mg (50 mg Oral Given 05/21/24 1029)  dorzolamide -timolol  (COSOPT ) 2-0.5 % ophthalmic solution 1 drop (has no administration in time range)  rosuvastatin  (CRESTOR ) tablet 20 mg (20 mg Oral Given 05/21/24 1029)  fentaNYL  (SUBLIMAZE ) injection 50 mcg (50 mcg Intravenous Given 05/20/24 1550)  HYDROmorphone  (DILAUDID ) injection 0.5 mg (0.5 mg Intravenous Given 05/20/24 1629)  iohexol  (OMNIPAQUE ) 350 MG/ML injection 100 mL (100 mLs Intravenous Contrast Given 05/20/24 1718)  HYDROmorphone  (DILAUDID ) injection 0.5 mg (0.5 mg Intravenous Given 05/20/24 1734)  vancomycin  (VANCOREADY) IVPB 1500 mg/300 mL (0 mg Intravenous Stopped 05/20/24 2151)   phytonadione  (VITAMIN K) tablet 2.5 mg (2.5 mg Oral Given 05/20/24 1936)  diazepam  (VALIUM ) injection 2.5 mg (2.5 mg Intravenous Given 05/20/24 2013)  0.9 %  sodium chloride  infusion (Manually program via Guardrails IV Fluids) ( Intravenous New Bag/Given 05/21/24 0131)                                     88yo male and [facility]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  Differential diagnosis includes hematoma with worsening, abscess, necrotizing fasciitis, compartment syndrome.  He has strong pulses in his bilateral feet, no signs of acute arterial thrombus.  Given location of pain is anterior, he is on Coumadin , have low suspicion for DVT.  Discussed with orthopedics on arrival given severity of pain with concern for possible compartment syndrome, and also with bedside ultrasound showing large fluid collection, question abscess of a size that would be most appropriately drained in the operating room.  Ozell Purchase came to bedside and evaluated the patient, does not feel that presentation is consistent with compartment syndrome and also has lower suspicion for infection.  Recommended CTA to evaluate for signs of active extravasation into his hematoma.  CTA performed and does show an area of active extravasation without defined vessel as the culprit.  There are no signs of subcutaneous air or CT findings to suggest necrotizing fasciitis.  Placed pressure on the area.  Dr. Teresa of orthopedics came to bedside to evaluate him.  Also feels his presentation is not consistent with compartment syndrome, and continued elevation and pressure over the area.  He recommends reversal of the patient's Coumadin .  Discussed reversal with pharmacy, cardiology, and patient.  At this time, I do not feel that he has life threatening bleeding that would require reversal with an agent such as Kcentra so long as he is not going  to the operating room given the presence of his mechanical mitral valve.  We agree with 2.5 mg of oral vitamin K for slow lowering, initiating heparin  for continued anticoagulation, and would consider other agents if an operative plan develops.  Did place needle and draw back pink  colored fluid from fluid colelct after the CT-not clear hematoma nor purulence, more serosanguinous-will send for culture.   Placed order for vanc/zosyn  with concern for possible infection--possible pain related to new bleeding into area, no leukocytosis or fever however given timing since initial injury and increasing pain and erythema continue to have concern for infection.  Given medications for pain.  Orthopedics continuing to follow. Will admit for further care.      Final diagnoses:  Hematoma of left lower extremity, initial encounter    ED Discharge Orders     None          Dreama Longs, MD 05/21/24 1153  "

## 2024-05-20 NOTE — ED Notes (Signed)
 MD called to bedside. With Fentanyl  and Dilaudid  pt is not getting any relief. Pain seems to still be severe. Pt is intermittently moaning and screaming in pain.

## 2024-05-20 NOTE — ED Notes (Addendum)
 Right lower leg wrapped with pressure bandage- verbal given by Dreama, MD.

## 2024-05-20 NOTE — ED Triage Notes (Signed)
 Pt bib [employer]. Had mechanical fall 05/06/24 outside, hit the flower pot. Ems reports a bruise on left face and has isolated injury to left shin 2/10 pain sitting still- 10/10 when he has a intermittent spasm. Pt unsure of what his leg hit. No syncope. Pt on coumadin . Hx of artificial mitral valve. AO x4.  BP 160/90  HR 102 CBG 188 RR 16 RA 98%

## 2024-05-20 NOTE — Consult Note (Signed)
 Reason for Consult:Left leg hematoma Referring Physician: Rocky Massy Time called: 1540 Time at bedside: 1600   Terry [name] is an 89 y.o. male.  HPI: Zell hit his left shin on a lawn ornament on Christmas Day. He had a small wound and developed some swelling from a hematoma. He's been doing reasonably well but today, while making a sandwich, had severe shooting pain in the lower leg. He had not had that prior to today. The severe pain passes after seconds to minutes and is replaced by a dull ache. He doesn't think the ache was present before today. He denies fevers, chills, sweats, N/V.  Past Medical History:  Diagnosis Date   Benign prostatic hypertrophy    Dyslipidemia    Hyperlipidemia    Long-term (current) use of anticoagulants    Mitral valve prolapse    Osteoporosis    Ventricular tachycardia [facility])     Past Surgical History:  Procedure Laterality Date   APPENDICO-VESICOSTOMY CUTANEOUS     CARDIAC CATHETERIZATION  08/19/2001   Essentially smooth and normal coronary arteries   CORONARY STENT PLACEMENT  11/03/2001   Mitral valve replacement with #35 [employer] mechanical mitral valve prosthesis and closure of  left atrial appendage    No family history on file.  Social History:  reports that he quit smoking about 60 years ago. His smoking use included cigarettes. He has never used smokeless tobacco. He reports that he does not drink alcohol and does not use drugs.  Allergies: Allergies[1]  Medications: I have reviewed the patient's current medications.  Results for orders placed or performed during the hospital encounter of 05/20/24 (from the past 48 hours)  I-Stat CG4 Lactic Acid     Status: None   Collection Time: 05/20/24  4:08 PM  Result Value Ref Range   Lactic Acid, Venous 1.6 0.5 - 1.9 mmol/L  CBC with Differential     Status: Abnormal   Collection Time: 05/20/24  4:08 PM  Result Value Ref Range   WBC 9.2 4.0 - 10.5 K/uL   RBC 3.68 (L) 4.22 - 5.81  MIL/uL   Hemoglobin 11.6 (L) 13.0 - 17.0 g/dL   HCT 63.1 (L) 60.9 - 47.9 %   MCV 100.0 80.0 - 100.0 fL   MCH 31.5 26.0 - 34.0 pg   MCHC 31.5 30.0 - 36.0 g/dL   RDW 86.3 88.4 - 84.4 %   Platelets 317 150 - 400 K/uL   nRBC 0.0 0.0 - 0.2 %   Neutrophils Relative % 80 %   Neutro Abs 7.4 1.7 - 7.7 K/uL   Lymphocytes Relative 6 %   Lymphs Abs 0.5 (L) 0.7 - 4.0 K/uL   Monocytes Relative 12 %   Monocytes Absolute 1.1 (H) 0.1 - 1.0 K/uL   Eosinophils Relative 2 %   Eosinophils Absolute 0.2 0.0 - 0.5 K/uL   Basophils Relative 0 %   Basophils Absolute 0.0 0.0 - 0.1 K/uL   Immature Granulocytes 0 %   Abs Immature Granulocytes 0.04 0.00 - 0.07 K/uL    Comment: Performed at [facility], [address], [place], [place] [address]    No results found.  Review of Systems  HENT:  Negative for ear discharge, ear pain, hearing loss and tinnitus.   Eyes:  Negative for photophobia and pain.  Respiratory:  Negative for cough and shortness of breath.   Cardiovascular:  Negative for chest pain.  Gastrointestinal:  Negative for abdominal pain, nausea and vomiting.  Genitourinary:  Negative for dysuria, flank pain, frequency and urgency.  Musculoskeletal:  Positive for arthralgias (Left lower leg). Negative for back pain, myalgias and neck pain.  Neurological:  Negative for dizziness and headaches.  Hematological:  Does not bruise/bleed easily.  Psychiatric/Behavioral:  The patient is not nervous/anxious.    There were no vitals taken for this visit. Physical Exam Constitutional:      General: He is not in acute distress.    Appearance: He is well-developed. He is not diaphoretic.  HENT:     Head: Normocephalic and atraumatic.  Eyes:     General: No scleral icterus.       Right eye: No discharge.        Left eye: No discharge.     Conjunctiva/sclera: Conjunctivae normal.  Cardiovascular:     Rate and Rhythm: Normal rate and regular rhythm.  Pulmonary:     Effort: Pulmonary effort is  normal. No respiratory distress.  Musculoskeletal:     Cervical back: Normal range of motion.     Comments: LLE Ulceration/abrasion with eschar ant lower leg, underlying hematoma, no sig TTP, no pain with passive dorsiflexion, compartments easily compressible, no ecchymosis or rash  No knee or ankle effusion  Knee stable to varus/ valgus and anterior/posterior stress  Sens DPN, SPN, TN intact  Motor EHL, ext, flex, evers 5/5  DP 2+, PT 2+, No significant edema  Skin:    General: Skin is warm and dry.  Neurological:     Mental Status: He is alert.  Psychiatric:        Mood and Affect: Mood normal.        Behavior: Behavior normal.     Assessment/Plan: Left lower leg hematoma -- His history and pain pattern sound like muscle spasms but I'm not sure why they would start suddenly today. Have asked EDP to get CT angio to make sure he has no new active source of bleeding. Evacuating the hematoma is very problematic as would likely require several subsequent surgeries for wound care and soft tissue coverage and he cannot stop anticoagulation. Dr. Teresa to f/u once studies are back.    Ozell DOROTHA Ned, PA-C Orthopedic Surgery [phone] 05/20/2024, 4:26 PM     [1]  Allergies Allergen Reactions   Alendronate     Other Reaction(s): GERD   Aspirin     Other Reaction(s): too much bruising when added to Coumadin    Cipro [Ciprofloxacin Hcl] Other (See Comments)    Heartburn    Sulfa Drugs Cross Reactors Hives

## 2024-05-21 DIAGNOSIS — T148XXA Other injury of unspecified body region, initial encounter: Secondary | ICD-10-CM | POA: Diagnosis not present

## 2024-05-21 DIAGNOSIS — S8012XA Contusion of left lower leg, initial encounter: Secondary | ICD-10-CM | POA: Diagnosis not present

## 2024-05-21 LAB — CBC
HCT: 29.9 % — ABNORMAL LOW (ref 39.0–52.0)
Hemoglobin: 9.7 g/dL — ABNORMAL LOW (ref 13.0–17.0)
MCH: 32.3 pg (ref 26.0–34.0)
MCHC: 32.4 g/dL (ref 30.0–36.0)
MCV: 99.7 fL (ref 80.0–100.0)
Platelets: 274 K/uL (ref 150–400)
RBC: 3 MIL/uL — ABNORMAL LOW (ref 4.22–5.81)
RDW: 13.7 % (ref 11.5–15.5)
WBC: 13.7 K/uL — ABNORMAL HIGH (ref 4.0–10.5)
nRBC: 0 % (ref 0.0–0.2)

## 2024-05-21 LAB — TYPE AND SCREEN
ABO/RH(D): A POS
Antibody Screen: NEGATIVE

## 2024-05-21 LAB — COMPREHENSIVE METABOLIC PANEL WITH GFR
ALT: 58 U/L — ABNORMAL HIGH (ref 0–44)
AST: 51 U/L — ABNORMAL HIGH (ref 15–41)
Albumin: 3.6 g/dL (ref 3.5–5.0)
Alkaline Phosphatase: 58 U/L (ref 38–126)
Anion gap: 11 (ref 5–15)
BUN: 19 mg/dL (ref 8–23)
CO2: 23 mmol/L (ref 22–32)
Calcium: 8.7 mg/dL — ABNORMAL LOW (ref 8.9–10.3)
Chloride: 101 mmol/L (ref 98–111)
Creatinine, Ser: 0.78 mg/dL (ref 0.61–1.24)
GFR, Estimated: >60 mL/min
Glucose, Bld: 124 mg/dL — ABNORMAL HIGH (ref 70–99)
Potassium: 3.9 mmol/L (ref 3.5–5.1)
Sodium: 136 mmol/L (ref 135–145)
Total Bilirubin: 0.8 mg/dL (ref 0.0–1.2)
Total Protein: 6.7 g/dL (ref 6.5–8.1)

## 2024-05-21 LAB — PROTIME-INR
INR: 2.3 — ABNORMAL HIGH (ref 0.8–1.2)
Prothrombin Time: 26.1 s — ABNORMAL HIGH (ref 11.4–15.2)

## 2024-05-21 LAB — HEPARIN LEVEL (UNFRACTIONATED): Heparin Unfractionated: 0.2 [IU]/mL — ABNORMAL LOW (ref 0.30–0.70)

## 2024-05-21 MED ORDER — METOPROLOL SUCCINATE ER 25 MG PO TB24
50.0000 mg | ORAL_TABLET | Freq: Every day | ORAL | Status: DC
  Administered 2024-05-21 – 2024-06-03 (×14): 50 mg via ORAL
  Filled 2024-05-21 (×14): qty 2

## 2024-05-21 MED ORDER — HEPARIN (PORCINE) 25000 UT/250ML-% IV SOLN
1350.0000 [IU]/h | INTRAVENOUS | Status: DC
  Administered 2024-05-21: 1250 [IU]/h via INTRAVENOUS
  Administered 2024-05-22 – 2024-05-23 (×2): 1350 [IU]/h via INTRAVENOUS
  Administered 2024-05-24: 1300 [IU]/h via INTRAVENOUS
  Administered 2024-05-25 (×2): 1350 [IU]/h via INTRAVENOUS
  Filled 2024-05-21 (×6): qty 250

## 2024-05-21 MED ORDER — METHOCARBAMOL 750 MG PO TABS
1500.0000 mg | ORAL_TABLET | Freq: Four times a day (QID) | ORAL | Status: AC
  Administered 2024-05-21 – 2024-05-23 (×9): 1500 mg via ORAL
  Filled 2024-05-21 (×9): qty 2

## 2024-05-21 MED ORDER — DORZOLAMIDE HCL-TIMOLOL MAL 2-0.5 % OP SOLN
1.0000 [drp] | Freq: Two times a day (BID) | OPHTHALMIC | Status: DC
  Administered 2024-05-21 – 2024-06-03 (×24): 1 [drp] via OPHTHALMIC
  Filled 2024-05-21: qty 10

## 2024-05-21 MED ORDER — ROSUVASTATIN CALCIUM 20 MG PO TABS
20.0000 mg | ORAL_TABLET | Freq: Every day | ORAL | Status: DC
  Administered 2024-05-21 – 2024-05-24 (×4): 20 mg via ORAL
  Filled 2024-05-21 (×4): qty 1

## 2024-05-21 MED ORDER — METHOCARBAMOL 500 MG PO TABS
500.0000 mg | ORAL_TABLET | Freq: Four times a day (QID) | ORAL | Status: DC | PRN
  Administered 2024-05-21 (×2): 500 mg via ORAL
  Filled 2024-05-21 (×2): qty 1

## 2024-05-21 NOTE — Progress Notes (Signed)
 Patient ID: [name], male   DOB: Mar 26, 1936, 89 y.o.   MRN: [id number]   LOS: 1 day   Subjective: Improved from yesterday but still having severe intermittent pains in leg.   Objective: Vital signs in last 24 hours: Temp:  [98.1 F (36.7 C)-99.2 F (37.3 C)] 98.4 F (36.9 C) (01/09 0450) Pulse Rate:  [108-117] 109 (01/09 0450) Resp:  [13-21] 17 (01/09 0450) BP: (131-175)/(65-94) 143/79 (01/09 0450) SpO2:  [95 %-100 %] 97 % (01/09 0450) Weight:  [85.1 kg] 85.1 kg (01/08 2230) Last BM Date : 05/19/24   Laboratory  CBC Recent Labs    05/20/24 1608 05/21/24 0631  WBC 9.2 13.7*  HGB 11.6* 9.7*  HCT 36.8* 29.9*  PLT 317 274   BMET Recent Labs    05/20/24 1608 05/21/24 0631  NA 139 136  K 3.9 3.9  CL 104 101  CO2 26 23  GLUCOSE 128* 124*  BUN 24* 19  CREATININE 0.87 0.78  CALCIUM  8.8* 8.7*     Physical Exam General appearance: alert and no distress LLE -- DP/PT 2+, no pain with passive dorsiflexion, compartments compressible without increased pain, SPN/DPN/TN intact   Assessment/Plan: Left leg hematoma -- No e/o compartment syndrome. Continue ACE compression, elevation, and ice. Will check with attending about increasing Robaxin . May need to think about adding a different class of muscle relaxer or possibly neuroleptic.    Terry DOROTHA Ned, PA-C Orthopedic Surgery [phone] 05/21/2024

## 2024-05-21 NOTE — Progress Notes (Signed)
" °   05/20/24 2042  Assess: MEWS Score  Temp 99 F (37.2 C)  BP (!) 163/86  MAP (mmHg) 106  Pulse Rate (!) 116  Resp 19  SpO2 97 %  O2 Device Room Air  Assess: MEWS Score  MEWS Temp 0  MEWS Systolic 0  MEWS Pulse 2  MEWS RR 0  MEWS LOC 0  MEWS Score 2  MEWS Score Color Yellow  Assess: if the MEWS score is Yellow or Red  Were vital signs accurate and taken at a resting state? Yes  Does the patient meet 2 or more of the SIRS criteria? No  MEWS guidelines implemented  Yes, yellow  Treat  MEWS Interventions Considered administering scheduled or prn medications/treatments as ordered  Take Vital Signs  Increase Vital Sign Frequency  Yellow: Q2hr x1, continue Q4hrs until patient remains green for 12hrs  Escalate  MEWS: Escalate Yellow: Discuss with charge nurse and consider notifying provider and/or RRT  Notify: Charge Nurse/RN  Name of Charge Nurse/RN Notified [place],RN  Assess: SIRS CRITERIA  SIRS Temperature  0  SIRS Respirations  0  SIRS Pulse 1  SIRS WBC 0  SIRS Score Sum  1    "

## 2024-05-21 NOTE — Progress Notes (Signed)
"   Currently has morphine  ordered for pain, has received x2 so far on my shift, minimal effects noted. Pain decreased from 10/10 to 9/10. Pt has had several different pain meds with minimal effects. However pain decreased to tolerable after taking valium . Pain described as intermittent spasms, Last dose of morphine  at 2356. Abigail Chavez,NP for pain medication to address muscle spasms, new order noted.  "

## 2024-05-21 NOTE — Progress Notes (Signed)
" °  Progress Note   Patient: Terry [name] FMW:[id number] DOB: 1935-08-12 DOA: 05/20/2024     1 DOS: the patient was seen and examined on 05/21/2024   Brief hospital course: 89 y.o. male with medical history significant of chronic anticoagulation secondary to mitral valve replacement with mechanical valve, essential hypertension, hyperlipidemia, history of BPH, who apparently bumped his left leg on Christmas Day when he hit the flowerpot.  He bruised his left face and then left shin.  Patient has been having intermittent spasm and pain that is getting worse.  Came to the ER where his left lower extremity is swollen.  The leg is tender and warm.  He was found to have significant hematoma.  Patient was evaluated by orthopedics for possible compartmental syndrome but ruled out.  Continues to have severe pain as high as 9 out of 10.  At this point recommendation is to admit the patient for pain management.  Orthopedics to follow.  No fractures.   Assessment and Plan: #1 left lower extremity hematoma:  -This was traumatic after hitting his leg.   -Noted to have supratherapeutic INR initially.   -Orthopedics is consulted. Recs for continued ACE compression, elevation, ice with aggressive analgesia.   #2 supratherapeutic INR: Patient is status post mechanical valve replacement and requires warfarin.   -Warfarin held initially -INR now 2.3 with heparin  bridge   #3 essential hypertension:  -continued toprol  XL 50mg    #4 history of mitral valve replacement:  -On chronic anticoagulation.  Continue as per above   #5 hyperlipidemia:  -Continue statin   #6 acute blood loss anemia:  -Continue to monitor H&H      Subjective: .Complaining of continued L LE pain  Physical Exam: Vitals:   05/21/24 0300 05/21/24 0328 05/21/24 0450 05/21/24 1106  BP: (!) 147/75 131/65 (!) 143/79 (!) 141/85  Pulse: (!) 110 (!) 110 (!) 109 (!) 110  Resp: 16 17 17 17   Temp: 98.4 F (36.9 C) 98.1 F (36.7 C) 98.4 F  (36.9 C) (!) 97.5 F (36.4 C)  TempSrc: Oral Oral Oral Oral  SpO2: 97% 96% 97% 97%  Weight:      Height:       General exam: Awake, laying in bed, in nad Respiratory system: Normal respiratory effort, no wheezing Cardiovascular system: regular rate, s1, s2 Gastrointestinal system: Soft, nondistended, positive BS Central nervous system: CN2-12 grossly intact, strength intact Extremities: LLE with ACE wrap in place Skin: Normal skin turgor, no notable skin lesions seen Psychiatry: Mood normal // no visual hallucinations   Data Reviewed:  Labs reviewed: Na 136, K 3.9, Cr 0.78, WBC 13.7, Hgb 9.7, Plts 274  Family Communication: Pt in room, family not at bedside  Disposition: Status is: Inpatient Remains inpatient appropriate because: severity of illness  Planned Discharge Destination: Home     Author: Garnette Pelt, MD 05/21/2024 5:03 PM  For on call review [email or web address]  "

## 2024-05-21 NOTE — Hospital Course (Signed)
 Terry [name] is a 89 y.o. male with a history of mitral valve replacement with mechanical valve, primary hypertension, hyperlipidemia, BPH.  Patient presented secondary to worsening leg swelling related to leg trauma and complicated by Coumadin  use, found to have a hematoma and evidence of compartment syndrome. Orthopedic surgery with recommendation for I&D. Initial debridement on 1/14 with application of a wound vac.

## 2024-05-21 NOTE — Progress Notes (Signed)
 PHARMACY - ANTICOAGULATION CONSULT NOTE  Pharmacy Consult for heparin  Indication: mechMVR  Allergies[1]  Patient Measurements: Height: 5' 11 (180.3 cm) Weight: 85.1 kg (187 lb 9.8 oz) IBW/kg (Calculated) : 75.3 HEPARIN  DW (KG): 85.1  Vital Signs: Temp: 98.4 F (36.9 C) (01/09 0450) Temp Source: Oral (01/09 0450) BP: 143/79 (01/09 0450) Pulse Rate: 109 (01/09 0450)  Labs: Recent Labs    05/20/24 1608 05/20/24 2207 05/21/24 0631  HGB 11.6*  --  9.7*  HCT 36.8*  --  29.9*  PLT 317  --  274  LABPROT 33.6* 35.4* 26.1*  INR 3.1* 3.3* 2.3*  CREATININE 0.87  --  0.78    Estimated Creatinine Clearance: 70.6 mL/min (by C-G formula based on SCr of 0.78 mg/dL).   Medical History: Past Medical History:  Diagnosis Date   Benign prostatic hypertrophy    Dyslipidemia    Hyperlipidemia    Long-term (current) use of anticoagulants    Mitral valve prolapse    Osteoporosis    Ventricular tachycardia [facility])       Assessment: 89 yo M on warfarin PTA for mechanical MVR with traumatic injury to LLE on 05/06/24 and worsening hematoma. Warfarin reversed with vitamin K po 2.5mg  x1. Pharmacy consulted for heparin  when INR < 2.5.    INR down to 2.3 today. Hgb down. Discussed with TRH, ok to start heparin  in afternoon.   Goal of Therapy:  Heparin  level 0.5-0.7 units/ml INR 2.5- 3.5 Monitor platelets by anticoagulation protocol: Yes   Plan:  Heparin  1250 units/hr, no bolus F/u 8hr Heparin  level Monitor daily heparin  level, CBC, signs/symptoms of bleeding  F/u restart warfarin  Jinnie Door, PharmD, BCPS, BCCP Clinical Pharmacist  Please check AMION for all [facility] Pharmacy phone numbers After 10:00 PM, call Main Pharmacy [phone]      [1]  Allergies Allergen Reactions   Alendronate Other (See Comments)    GERD   Aspirin     too much bruising when added to Coumadin    Cipro [Ciprofloxacin Hcl] Other (See Comments)    Heartburn    Sulfa Drugs Cross Reactors Hives

## 2024-05-21 NOTE — Plan of Care (Signed)

## 2024-05-22 DIAGNOSIS — T148XXA Other injury of unspecified body region, initial encounter: Secondary | ICD-10-CM | POA: Diagnosis not present

## 2024-05-22 DIAGNOSIS — S8012XA Contusion of left lower leg, initial encounter: Secondary | ICD-10-CM | POA: Diagnosis not present

## 2024-05-22 LAB — CBC
HCT: 26.6 % — ABNORMAL LOW (ref 39.0–52.0)
Hemoglobin: 8.8 g/dL — ABNORMAL LOW (ref 13.0–17.0)
MCH: 31.9 pg (ref 26.0–34.0)
MCHC: 33.1 g/dL (ref 30.0–36.0)
MCV: 96.4 fL (ref 80.0–100.0)
Platelets: 276 K/uL (ref 150–400)
RBC: 2.76 MIL/uL — ABNORMAL LOW (ref 4.22–5.81)
RDW: 13.6 % (ref 11.5–15.5)
WBC: 15.1 K/uL — ABNORMAL HIGH (ref 4.0–10.5)
nRBC: 0 % (ref 0.0–0.2)

## 2024-05-22 LAB — COMPREHENSIVE METABOLIC PANEL WITH GFR
ALT: 56 U/L — ABNORMAL HIGH (ref 0–44)
AST: 87 U/L — ABNORMAL HIGH (ref 15–41)
Albumin: 3.2 g/dL — ABNORMAL LOW (ref 3.5–5.0)
Alkaline Phosphatase: 51 U/L (ref 38–126)
Anion gap: 9 (ref 5–15)
BUN: 19 mg/dL (ref 8–23)
CO2: 23 mmol/L (ref 22–32)
Calcium: 8.3 mg/dL — ABNORMAL LOW (ref 8.9–10.3)
Chloride: 100 mmol/L (ref 98–111)
Creatinine, Ser: 0.73 mg/dL (ref 0.61–1.24)
GFR, Estimated: >60 mL/min
Glucose, Bld: 113 mg/dL — ABNORMAL HIGH (ref 70–99)
Potassium: 4 mmol/L (ref 3.5–5.1)
Sodium: 133 mmol/L — ABNORMAL LOW (ref 135–145)
Total Bilirubin: 0.9 mg/dL (ref 0.0–1.2)
Total Protein: 6.2 g/dL — ABNORMAL LOW (ref 6.5–8.1)

## 2024-05-22 LAB — PREPARE FRESH FROZEN PLASMA

## 2024-05-22 LAB — BPAM FFP
Blood Product Expiration Date: 202601132359
Blood Product Expiration Date: 202601132359
ISSUE DATE / TIME: 202601090116
ISSUE DATE / TIME: 202601090308
Unit Type and Rh: 600
Unit Type and Rh: 6200

## 2024-05-22 LAB — PROTIME-INR
INR: 2 — ABNORMAL HIGH (ref 0.8–1.2)
Prothrombin Time: 23.4 s — ABNORMAL HIGH (ref 11.4–15.2)

## 2024-05-22 LAB — HEPARIN LEVEL (UNFRACTIONATED)
Heparin Unfractionated: 0.49 [IU]/mL (ref 0.30–0.70)
Heparin Unfractionated: 0.5 [IU]/mL (ref 0.30–0.70)

## 2024-05-22 MED ORDER — TAFLUPROST (PF) 0.0015 % OP SOLN
1.0000 [drp] | Freq: Every day | OPHTHALMIC | Status: DC
  Administered 2024-05-22 – 2024-06-02 (×12): 1 [drp] via OPHTHALMIC
  Filled 2024-05-22 (×4): qty 1

## 2024-05-22 NOTE — Progress Notes (Signed)
 Terry [name]  MRN: [id number] DOB/Age: 87/11/1935 86 y.o. Physician: MARLA Pointer, M.D.     Subjective: Reports continued significant left leg pain.  He states it is unclear if there is been any improvement since admission. Vital Signs Temp:  [97.3 F (36.3 C)-98.3 F (36.8 C)] 98.3 F (36.8 C) (01/10 0535) Pulse Rate:  [99-110] 99 (01/10 0535) Resp:  [16-17] 17 (01/10 0535) BP: (133-141)/(63-85) 140/63 (01/10 0535) SpO2:  [94 %-98 %] 98 % (01/10 0535)  Lab Results Recent Labs    05/21/24 0631 05/22/24 0813  WBC 13.7* 15.1*  HGB 9.7* 8.8*  HCT 29.9* 26.6*  PLT 274 276   BMET Recent Labs    05/21/24 0631 05/22/24 0813  NA 136 133*  K 3.9 4.0  CL 101 100  CO2 23 23  GLUCOSE 124* 113*  BUN 19 19  CREATININE 0.78 0.73  CALCIUM  8.7* 8.3*   POC INR  Date Value Ref Range Status  01/28/2023 2.3  Final   INR  Date Value Ref Range Status  05/22/2024 2.0 (H) 0.8 - 1.2 Final    Comment:    (NOTE) INR goal varies based on device and disease states. Performed at [facility], [address] [place], [place] [address]      Exam  Left leg dressings were removed.  Calf remains swollen with some scant bloody drainage into the dressings.  Limited ankle dorsi and plantarflexion but grossly neurovascular intact distally.  There does appear to be the return of several regions of superficial skin creasing, suggesting that overall swelling has improved somewhat.  Dressing reapplied very loosely.  Plan Continue elevation with edema reduction techniques.  Patient encouraged to work on ankle dorsi and plantarflexion exercises to encourage edema reduction. Terry [name] M Terry [name] 05/22/2024, 10:08 AM   Contact # [phone]

## 2024-05-22 NOTE — Plan of Care (Signed)

## 2024-05-22 NOTE — Progress Notes (Addendum)
 PHARMACY - ANTICOAGULATION CONSULT NOTE  Pharmacy Consult for heparin  Indication: mechMVR  Allergies[1]  Patient Measurements: Height: 5' 11 (180.3 cm) Weight: 85.1 kg (187 lb 9.8 oz) IBW/kg (Calculated) : 75.3 HEPARIN  DW (KG): 85.1  Vital Signs: Temp: 98.3 F (36.8 C) (01/10 0535) Temp Source: Oral (01/10 0535) BP: 140/63 (01/10 0535) Pulse Rate: 99 (01/10 0535)  Labs: Recent Labs    05/20/24 1608 05/20/24 2207 05/21/24 0631 05/21/24 2225 05/22/24 0813  HGB 11.6*  --  9.7*  --  8.8*  HCT 36.8*  --  29.9*  --  26.6*  PLT 317  --  274  --  276  LABPROT 33.6* 35.4* 26.1*  --  23.4*  INR 3.1* 3.3* 2.3*  --  2.0*  HEPARINUNFRC  --   --   --  0.20* 0.49  CREATININE 0.87  --  0.78  --  0.73    Estimated Creatinine Clearance: 70.6 mL/min (by C-G formula based on SCr of 0.73 mg/dL).  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 Warfarin reversed with vitamin K po 2.5mg  x1. Pharmacy consulted for heparin  when INR < 2.5, started 1/9.    INR down to 2 today. Hgb down trending, platelets are normal. Heparin  level is therapeutic at 0.49 on 1350 units/hr. No overt bleeding noted, left leg hematoma without evidence of compartment syndrome per Ortho noted.   Goal of Therapy:  Heparin  level 0.3-0.7 units/ml INR 2.5- 3.5 Monitor platelets by anticoagulation protocol: Yes   Plan:  Continue heparin  drip at 1350 units/hr, no bolus 6 hr confirm heparin  level Monitor daily heparin  level, CBC, signs/symptoms of bleeding  F/u restart warfarin  Thank you for involving pharmacy in this patient's care.  Delon Sax, PharmD, BCPS Clinical Pharmacist Clinical phone for 05/22/2024 is [phone] 05/22/2024 9:39 AM   Addendum: Heparin  level is therapeutic at 0.5 on 1350 units/hr - continue this rate and f/u in am.  Delon Sax, PharmD, BCPS 3:24 PM        [1]  Allergies Allergen Reactions    Alendronate Other (See Comments)    GERD   Aspirin     too much bruising when added to Coumadin    Cipro [Ciprofloxacin Hcl] Other (See Comments)    Heartburn    Sulfa Drugs Cross Reactors Hives

## 2024-05-22 NOTE — Progress Notes (Signed)
" °  Progress Note   Patient: Terry [name] FMW:[id number] DOB: 1936-03-12 DOA: 05/20/2024     2 DOS: the patient was seen and examined on 05/22/2024   Brief hospital course: 89 y.o. male with medical history significant of chronic anticoagulation secondary to mitral valve replacement with mechanical valve, essential hypertension, hyperlipidemia, history of BPH, who apparently bumped his left leg on Christmas Day when he hit the flowerpot.  He bruised his left face and then left shin.  Patient has been having intermittent spasm and pain that is getting worse.  Came to the ER where his left lower extremity is swollen.  The leg is tender and warm.  He was found to have significant hematoma.  Patient was evaluated by orthopedics for possible compartmental syndrome but ruled out.  Continues to have severe pain as high as 9 out of 10.  At this point recommendation is to admit the patient for pain management.  Orthopedics to follow.  No fractures.   Assessment and Plan: #1 left lower extremity hematoma:  -This was traumatic after hitting his leg.   -Noted to have supratherapeutic INR initially.   -Orthopedics is consulted. Recs for continued ACE compression, elevation, ice with aggressive analgesia. -Per Orthopedic Surgery, swelling seems to be improving   #2 supratherapeutic INR: Patient is status post mechanical valve replacement and requires warfarin.   -Warfarin held initially -now on heparin  bridge. Anticipate resuming coumadin  once OK with Orthopedic Surgery   #3 essential hypertension:  -continued toprol  XL 50mg    #4 history of mitral valve replacement:  -On chronic anticoagulation.  Continue as per above   #5 hyperlipidemia:  -Continue statin   #6 acute blood loss anemia:  -Continue to monitor H&H      Subjective: .Complaining of LE pains  Physical Exam: Vitals:   05/21/24 1807 05/21/24 2050 05/22/24 0535 05/22/24 1031  BP: 133/65 136/70 (!) 140/63 (!) 141/70  Pulse: (!) 108 (!)  108 99 (!) 109  Resp: 17 16 17    Temp: 97.6 F (36.4 C) (!) 97.3 F (36.3 C) 98.3 F (36.8 C)   TempSrc: Oral Oral Oral   SpO2: 97% 94% 98%   Weight:      Height:       General exam: Conversant, in no acute distress Respiratory system: normal chest rise, clear, no audible wheezing Cardiovascular system: regular rhythm, s1-s2 Gastrointestinal system: Nondistended, nontender, pos BS Central nervous system: No seizures, no tremors Extremities: LLE elevated Skin: No rashes, no pallor Psychiatry: Affect normal // no auditory hallucinations   Data Reviewed:  Labs reviewed: Na 133, K 4.0, Cr 0.73, WBC 15.1, Hgb 8.8, Plts 276  Family Communication: Pt in room, family not at bedside  Disposition: Status is: Inpatient Remains inpatient appropriate because: severity of illness  Planned Discharge Destination: Home     Author: Garnette Pelt, MD 05/22/2024 5:28 PM  For on call review [email or web address]  "

## 2024-05-22 NOTE — Progress Notes (Signed)
 PHARMACY - ANTICOAGULATION Pharmacy Consult for heparin  Indication: mechanical valve replacement Brief A/P: Heparin  level subtherapeutic Increase Heparin  rate  Allergies[1]  Patient Measurements: Height: 5' 11 (180.3 cm) Weight: 85.1 kg (187 lb 9.8 oz) IBW/kg (Calculated) : 75.3 HEPARIN  DW (KG): 85.1  Vital Signs: Temp: 97.3 F (36.3 C) (01/09 2050) Temp Source: Oral (01/09 2050) BP: 136/70 (01/09 2050) Pulse Rate: 108 (01/09 2050)  Labs: Recent Labs    05/20/24 1608 05/20/24 2207 05/21/24 0631 05/21/24 2225  HGB 11.6*  --  9.7*  --   HCT 36.8*  --  29.9*  --   PLT 317  --  274  --   LABPROT 33.6* 35.4* 26.1*  --   INR 3.1* 3.3* 2.3*  --   HEPARINUNFRC  --   --   --  0.20*  CREATININE 0.87  --  0.78  --     Estimated Creatinine Clearance: 70.6 mL/min (by C-G formula based on SCr of 0.78 mg/dL).  Assessment: 89 y.o. male admitted with leg hematoma, h/o mechanical MVR and Coumadin  on hold, for heparin   Goal of Therapy:  Heparin  level 0.3-0.7 units/ml INR 2.5- 3.5 Monitor platelets by anticoagulation protocol: Yes   Plan:  Increase Heparin  1350 units/hr Follow-up am labs.  Cathlyn Arrant, PharmD, BCPS        [1]  Allergies Allergen Reactions   Alendronate Other (See Comments)    GERD   Aspirin     too much bruising when added to Coumadin    Cipro [Ciprofloxacin Hcl] Other (See Comments)    Heartburn    Sulfa Drugs Cross Reactors Hives

## 2024-05-22 NOTE — Progress Notes (Signed)
 Patient's niece brought his home prescription of Tafluprost  ophthalmic solution in to be administered to the patient.  Took medication to pharmacy to be counted and verified.  Pharmacist recommended keeping the medication in the pyxis on the unit and administering as needed. A copy of the receipt for home medications is in the patient's chart and one is in the bag with the medication in the pyxis.

## 2024-05-23 ENCOUNTER — Inpatient Hospital Stay (HOSPITAL_COMMUNITY)

## 2024-05-23 DIAGNOSIS — T148XXA Other injury of unspecified body region, initial encounter: Secondary | ICD-10-CM | POA: Diagnosis not present

## 2024-05-23 DIAGNOSIS — S8012XA Contusion of left lower leg, initial encounter: Secondary | ICD-10-CM | POA: Diagnosis not present

## 2024-05-23 LAB — CBC
HCT: 25.4 % — ABNORMAL LOW (ref 39.0–52.0)
Hemoglobin: 8.4 g/dL — ABNORMAL LOW (ref 13.0–17.0)
MCH: 31.8 pg (ref 26.0–34.0)
MCHC: 33.1 g/dL (ref 30.0–36.0)
MCV: 96.2 fL (ref 80.0–100.0)
Platelets: 271 K/uL (ref 150–400)
RBC: 2.64 MIL/uL — ABNORMAL LOW (ref 4.22–5.81)
RDW: 13.6 % (ref 11.5–15.5)
WBC: 16.7 K/uL — ABNORMAL HIGH (ref 4.0–10.5)
nRBC: 0 % (ref 0.0–0.2)

## 2024-05-23 LAB — PROTIME-INR
INR: 1.8 — ABNORMAL HIGH (ref 0.8–1.2)
Prothrombin Time: 21.8 s — ABNORMAL HIGH (ref 11.4–15.2)

## 2024-05-23 LAB — HEPARIN LEVEL (UNFRACTIONATED): Heparin Unfractionated: 0.49 [IU]/mL (ref 0.30–0.70)

## 2024-05-23 MED ORDER — OXYCODONE HCL 5 MG PO TABS
5.0000 mg | ORAL_TABLET | ORAL | Status: DC | PRN
  Administered 2024-05-23 – 2024-06-02 (×10): 5 mg via ORAL
  Filled 2024-05-23 (×11): qty 1

## 2024-05-23 MED ORDER — OXYCODONE HCL 5 MG PO TABS: 2.5000 mg | ORAL_TABLET | ORAL | Status: DC | PRN

## 2024-05-23 MED ORDER — IOHEXOL 350 MG/ML SOLN
100.0000 mL | Freq: Once | INTRAVENOUS | Status: AC | PRN
  Administered 2024-05-23: 100 mL via INTRAVENOUS

## 2024-05-23 MED ORDER — MORPHINE SULFATE (PF) 2 MG/ML IV SOLN
2.0000 mg | INTRAVENOUS | Status: DC | PRN
  Administered 2024-05-23 – 2024-05-28 (×3): 2 mg via INTRAVENOUS
  Filled 2024-05-23 (×6): qty 1

## 2024-05-23 MED ORDER — ACETAMINOPHEN 500 MG PO TABS
1000.0000 mg | ORAL_TABLET | Freq: Three times a day (TID) | ORAL | Status: DC
  Administered 2024-05-23 – 2024-06-03 (×24): 1000 mg via ORAL
  Filled 2024-05-23 (×24): qty 2

## 2024-05-23 NOTE — Progress Notes (Signed)
 Repeat CTA bilateral lower extremities obtained and reviewed.  There is evidence of interval progression of subcutaneous extra fascial hematoma with some continued evidence of active cutaneous vessel bleed in the anterolateral compartment. There has been extension of the hematoma into the posterolateral subcutaneous tissues with mass effect despite initial compression wrap and elevation of the extremity. Reviewed initial CTA with on call IR physician and he expressed that the vessel responsible was too small to embolize.  He will review the repeat CTA to determine whether IR intervention may be possible.  At this time continue elevation above the level of the heart and compression wrap of extremity. Case was also discussed with Jerona Sage by my colleague who will consult in the morning as well to determine if any additional measures may be utilized.   Rankin Pizza, MD

## 2024-05-23 NOTE — Progress Notes (Signed)
" °  Progress Note   Patient: Terry [name] FMW:[id number] DOB: 1935-09-13 DOA: 05/20/2024     3 DOS: the patient was seen and examined on 05/23/2024   Brief hospital course: 89 y.o. male with medical history significant of chronic anticoagulation secondary to mitral valve replacement with mechanical valve, essential hypertension, hyperlipidemia, history of BPH, who apparently bumped his left leg on Christmas Day when he hit the flowerpot.  He bruised his left face and then left shin.  Patient has been having intermittent spasm and pain that is getting worse.  Came to the ER where his left lower extremity is swollen.  The leg is tender and warm.  He was found to have significant hematoma.  Patient was evaluated by orthopedics for possible compartmental syndrome but ruled out.  Continues to have severe pain as high as 9 out of 10.  At this point recommendation is to admit the patient for pain management.  Orthopedics to follow.  No fractures.   Assessment and Plan: #1 left lower extremity hematoma:  -This was traumatic after hitting his leg.   -Noted to have supratherapeutic INR initially.   -Orthopedics is consulted. Recs for continued ACE compression, elevation, ice with aggressive analgesia. -Per Orthopedic Surgery, blistering appears to be worsening. Orthopedics discussed case with IR. -Discussed case with Cardiology. Given pt's mechanical mitral valve, he will need therapeutic anticoagulation given very high risk of thrombosis. Discussed with pharmacy with plan to keep at lowest level of therapeutic dose of heparin    #2 supratherapeutic INR: Patient is status post mechanical valve replacement and requires warfarin.   -Warfarin held  -now on heparin  bridge. Anticipate resuming coumadin  once OK with Orthopedic Surgery   #3 essential hypertension:  -continued toprol  XL 50mg    #4 history of mitral valve replacement:  -On chronic anticoagulation.  Continue as per above   #5 hyperlipidemia:   -Continue statin   #6 acute blood loss anemia:  -Continue to monitor H&H      Subjective: .Complains of continued LLE pain  Physical Exam: Vitals:   05/22/24 1817 05/22/24 2111 05/23/24 0549 05/23/24 1725  BP: 121/65 (!) 122/58 (!) 140/66 (!) 125/55  Pulse: (!) 102 87 98 (!) 101  Resp: 18 17 17 17   Temp: 97.9 F (36.6 C) 97.8 F (36.6 C) (!) 97.4 F (36.3 C) 98.2 F (36.8 C)  TempSrc:  Oral Oral   SpO2: 95% 94% 95% 93%  Weight:      Height:       General exam: Awake, laying in bed, in nad Respiratory system: Normal respiratory effort, no wheezing Cardiovascular system: regular rate, s1, s2 Gastrointestinal system: Soft, nondistended, positive BS Central nervous system: CN2-12 grossly intact, strength intact Extremities: Perfused, no clubbing, LLE remains with dressings in place Skin: Normal skin turgor, no notable skin lesions seen Psychiatry: Mood normal // no visual hallucinations   Data Reviewed:  Labs reviewed: WBC 16.7, Hgb 8.4, Plts 271  Family Communication: Pt in room, family not at bedside  Disposition: Status is: Inpatient Remains inpatient appropriate because: severity of illness  Planned Discharge Destination: Home     Author: Garnette Pelt, MD 05/23/2024 6:26 PM  For on call review [email or web address]  "

## 2024-05-23 NOTE — Plan of Care (Signed)

## 2024-05-23 NOTE — Progress Notes (Signed)
 "  Subjective:  89 y.o. male now hospital day 3 for spontaneous left subcutaneous extra fascial anterolateral lower extremity bleed. Patient reports that the left leg has continued to have swelling and has developed blistering.  He does not have the muscle spasms or pain in the same way as he did on admission.  He does not some altered sensation on the lateral aspect of his foot, which he is unsure if that was present on admission.  He has been reversed off coumadin  and is currently being treated with heparin  for his mechanical heart valve.  Otherwise, no acute events overnight and he has had no fevers or chills.  Objective:   VITALS:   Vitals:   05/22/24 1817 05/22/24 1817 05/22/24 2111 05/23/24 0549  BP: 121/65 121/65 (!) 122/58 (!) 140/66  Pulse: (!) 102 (!) 102 87 98  Resp: 17 18 17 17   Temp: 97.9 F (36.6 C) 97.9 F (36.6 C) 97.8 F (36.6 C) (!) 97.4 F (36.3 C)  TempSrc: Oral  Oral Oral  SpO2: 95% 95% 94% 95%  Weight:      Height:        Physical Exam: General: Alert, no acute distress Cardiovascular: intact distal pulses Respiratory: No cyanosis, no use of accessory musculature GI: No organomegaly, abdomen is soft and non-tender Skin: large area of blistering of the left lower extremity Psychiatric: Patient is competent for consent with normal mood and affect Lymphatic: No axillary or cervical lymphadenopathy  MUSCULOSKELETAL: Left lower extremity - Skin swollen with ecchymosis and interval worsening of skin blisters extending from the proximal third to the distal third of the tibia and extending to lateral half of the posterior calf.  There is sloughing of the skin with granulation tissue underneath.  Ecchymosis present - Some interval worsening swelling of the anterolateral tissues - Decreased sensation in the area of the blisters - tender to the calf, but less tender than previous - tissues full, but compressible - Motor intact to ankle dorsiflexion/plantarflexion  with pain, able to flex and extend toes without issue - able to passively stretch ankle and bring knee throughout range of motion without pain out of proportion.  Some increased discomfort with dorsiflexion to neutral and inversion. - Sensation intact to light touch saphenous, tibial, deep and superficial peroneal nerve distributions. Mildly decreased sensation to the sural nerve distribution. - 2+ DP and PT pulses  Lab Results  Component Value Date   WBC 16.7 (H) 05/23/2024   HGB 8.4 (L) 05/23/2024   HCT 25.4 (L) 05/23/2024   MCV 96.2 05/23/2024   PLT 271 05/23/2024   BMET    Component Value Date/Time   NA 133 (L) 05/22/2024 0813   K 4.0 05/22/2024 0813   CL 100 05/22/2024 0813   CO2 23 05/22/2024 0813   GLUCOSE 113 (H) 05/22/2024 0813   BUN 19 05/22/2024 0813   CREATININE 0.73 05/22/2024 0813   CALCIUM  8.3 (L) 05/22/2024 0813   GFRNONAA >60 05/22/2024 0813     Assessment/Plan: 89 y.o. male  now hospital day 3 from left lower extremity subcutaneous extrafascial lower extremity bleed anterolateral tibia  Principal Problem:   Hematoma Active Problems:   H/O mitral valve replacement with mechanical valve   Chronic anticoagulation   Acute blood loss anemia   Essential hypertension   Hyperlipidemia The patient has had interval worsening of swelling and blister formation of the soft tissues.  Given that supportive measures and reversal of his coumadin  have not provided adequate resolution of his  swelling, we will order a repeat CTA of the left lower extremity for evaluation of status of his extra fascial bleed. IR will also be consulted to evaluate for possible embolization. His exam is not consistent with compartment syndrome and his bleed is extra fascial.  His new sural nerve paresthesias is likely secondary due to the increased swelling of the subcutaneous tissues.  At this time as upon his admission, I would not recommend surgical intervention as an incision in his compromised  skin has a high likelihood of wound complications and infection.  Depending on the course of his skin blistering, plastic surgery may be required in the future.  Discussion was had with the patient regarding these findings and plan and he agrees to proceed with a repeat CTA and further evaluation of the extremity with IR  Weight bearing: as tolerated Pain control: oxy and tylenol  Continue icing and elevating the LLE above the level of the heart PT/OT DVT ppx: heparin  due mechanical heart valve Repeat CTA B/L LE, consult IR Bowel regimen: docusate Dispo: Pending IR eval and repeat CTA   Terry [name] 05/23/2024, 3:32 PM  "

## 2024-05-23 NOTE — Progress Notes (Addendum)
 PHARMACY - ANTICOAGULATION CONSULT NOTE  Pharmacy Consult for heparin  Indication: mechMVR  Allergies[1]  Patient Measurements: Height: 5' 11 (180.3 cm) Weight: 85.1 kg (187 lb 9.8 oz) IBW/kg (Calculated) : 75.3 HEPARIN  DW (KG): 85.1  Vital Signs: Temp: 97.4 F (36.3 C) (01/11 0549) Temp Source: Oral (01/11 0549) BP: 140/66 (01/11 0549) Pulse Rate: 98 (01/11 0549)  Labs: Recent Labs    05/20/24 1608 05/20/24 2207 05/21/24 0631 05/21/24 2225 05/22/24 0813 05/22/24 1418 05/23/24 0612  HGB 11.6*  --  9.7*  --  8.8*  --  8.4*  HCT 36.8*  --  29.9*  --  26.6*  --  25.4*  PLT 317  --  274  --  276  --  271  LABPROT 33.6*   < > 26.1*  --  23.4*  --  21.8*  INR 3.1*   < > 2.3*  --  2.0*  --  1.8*  HEPARINUNFRC  --   --   --    < > 0.49 0.50 0.49  CREATININE 0.87  --  0.78  --  0.73  --   --    < > = values in this interval not displayed.    Estimated Creatinine Clearance: 70.6 mL/min (by C-G formula based on SCr of 0.73 mg/dL).  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 Warfarin reversed with vitamin K po 2.5mg  x1. Pharmacy consulted for heparin  when INR < 2.5, started 1/9.    Heparin  level is therapeutic at 0.49 on 1350 units/hr. No overt bleeding noted, Hgb down 8s, platelets are normal, left leg hematoma without evidence of compartment syndrome per Ortho noted.   Goal of Therapy:  Heparin  level 0.3-0.5 units/ml INR 2.5- 3.5 Monitor platelets by anticoagulation protocol: Yes   Plan:  Continue heparin  drip at 1350 units/hr, no bolus Monitor daily heparin  level, CBC, signs/symptoms of bleeding  F/u restart warfarin when ok with Ortho  Thank you for involving pharmacy in this patient's care.  Delon Sax, PharmD, BCPS Clinical Pharmacist Clinical phone for 05/23/2024 is [phone] 05/23/2024 8:08 AM  Addendum: Decrease heparin  drip to 1300 units/hr to target closer to low end of goal per  discussion with Dr Cindy.  Delon Sax, PharmD, BCPS 10:18 AM       [1]  Allergies Allergen Reactions   Alendronate Other (See Comments)    GERD   Aspirin     too much bruising when added to Coumadin    Cipro [Ciprofloxacin Hcl] Other (See Comments)    Heartburn    Sulfa Drugs Cross Reactors Hives

## 2024-05-23 NOTE — Progress Notes (Signed)
 Patient ID: Terry [name], male   DOB: Sep 29, 1935, 89 y.o.   MRN: [id number] Subjective:  Reports onset of left leg swelling around Christmas time Reports of left leg contusion with increasing swelling and pain that lead to admission last Thursday.   Reports stable level of pain, no progression of his pain levels since admission   No events overnight  Objective:   VITALS:   Vitals:   05/22/24 2111 05/23/24 0549  BP: (!) 122/58 (!) 140/66  Pulse: 87 98  Resp: 17 17  Temp: 97.8 F (36.6 C) (!) 97.4 F (36.3 C)  SpO2: 94% 95%    Exam: Left leg dressings removed Significant lower leg swelling with blood filled blistering anterior lateral leg  With a little distraction (during our discussion) he tolerate passive ROM with mild pain Pain with passive stretch of left ankle to neutral while re-dressing left leg  Difficulty without comparison to no differences between today's exam versus prior  Intact sensibility Palpable pulses Able to wiggle toes and perform dorsiflexion with 4/5 strength  LABS Recent Labs    05/21/24 0631 05/22/24 0813 05/23/24 0612  HGB 9.7* 8.8* 8.4*  HCT 29.9* 26.6* 25.4*  WBC 13.7* 15.1* 16.7*  PLT 274 276 271    Recent Labs    05/20/24 1608 05/21/24 0631 05/22/24 0813  NA 139 136 133*  K 3.9 3.9 4.0  BUN 24* 19 19  CREATININE 0.87 0.78 0.73  GLUCOSE 128* 124* 113*    Recent Labs    05/22/24 0813 05/23/24 0612  INR 2.0* 1.8*     Assessment/Plan: Left lower leg swelling related to chronic anticoagulation and relatively minor trauma  Plan: After reviewing the current state of his leg, his presenting history and time from injury as well as limited comparable exam findings I feel that we can continue to observe No obvious concern for limb loss at this point I want to make sure Dr. Teresa and or Ozell Ned follow up with him tomorrow so they can assess fro any significant positive or negative changes Leg redressed today

## 2024-05-24 DIAGNOSIS — S8012XA Contusion of left lower leg, initial encounter: Secondary | ICD-10-CM

## 2024-05-24 DIAGNOSIS — Z952 Presence of prosthetic heart valve: Secondary | ICD-10-CM

## 2024-05-24 LAB — C-REACTIVE PROTEIN: CRP: 19.6 mg/dL — ABNORMAL HIGH

## 2024-05-24 LAB — HEPARIN LEVEL (UNFRACTIONATED)
Heparin Unfractionated: 0.24 [IU]/mL — ABNORMAL LOW (ref 0.30–0.70)
Heparin Unfractionated: 0.39 [IU]/mL (ref 0.30–0.70)

## 2024-05-24 LAB — COMPREHENSIVE METABOLIC PANEL WITH GFR
ALT: 58 U/L — ABNORMAL HIGH (ref 0–44)
AST: 95 U/L — ABNORMAL HIGH (ref 15–41)
Albumin: 2.9 g/dL — ABNORMAL LOW (ref 3.5–5.0)
Alkaline Phosphatase: 58 U/L (ref 38–126)
Anion gap: 9 (ref 5–15)
BUN: 21 mg/dL (ref 8–23)
CO2: 22 mmol/L (ref 22–32)
Calcium: 8.1 mg/dL — ABNORMAL LOW (ref 8.9–10.3)
Chloride: 99 mmol/L (ref 98–111)
Creatinine, Ser: 0.83 mg/dL (ref 0.61–1.24)
GFR, Estimated: >60 mL/min
Glucose, Bld: 92 mg/dL (ref 70–99)
Potassium: 4 mmol/L (ref 3.5–5.1)
Sodium: 130 mmol/L — ABNORMAL LOW (ref 135–145)
Total Bilirubin: 0.8 mg/dL (ref 0.0–1.2)
Total Protein: 5.9 g/dL — ABNORMAL LOW (ref 6.5–8.1)

## 2024-05-24 LAB — CBC
HCT: 24.7 % — ABNORMAL LOW (ref 39.0–52.0)
Hemoglobin: 8.3 g/dL — ABNORMAL LOW (ref 13.0–17.0)
MCH: 32.3 pg (ref 26.0–34.0)
MCHC: 33.6 g/dL (ref 30.0–36.0)
MCV: 96.1 fL (ref 80.0–100.0)
Platelets: 294 K/uL (ref 150–400)
RBC: 2.57 MIL/uL — ABNORMAL LOW (ref 4.22–5.81)
RDW: 13.5 % (ref 11.5–15.5)
WBC: 15.2 K/uL — ABNORMAL HIGH (ref 4.0–10.5)
nRBC: 0 % (ref 0.0–0.2)

## 2024-05-24 LAB — CK: Total CK: 1772 U/L — ABNORMAL HIGH (ref 49–397)

## 2024-05-24 MED ORDER — SODIUM CHLORIDE 0.9 % IV SOLN
2.0000 g | Freq: Every day | INTRAVENOUS | Status: DC
  Administered 2024-05-24 – 2024-05-31 (×8): 2 g via INTRAVENOUS
  Filled 2024-05-24 (×8): qty 20

## 2024-05-24 MED ORDER — LINEZOLID 600 MG PO TABS
600.0000 mg | ORAL_TABLET | Freq: Two times a day (BID) | ORAL | Status: DC
  Administered 2024-05-24 – 2024-06-03 (×20): 600 mg via ORAL
  Filled 2024-05-24 (×20): qty 1

## 2024-05-24 NOTE — Progress Notes (Addendum)
 PHARMACY - ANTICOAGULATION CONSULT NOTE  Pharmacy Consult for heparin  Indication: mech MVR (warfarin PTA on hold)  Allergies[1]  Patient Measurements: Height: 5' 11 (180.3 cm) Weight: 85.1 kg (187 lb 9.8 oz) IBW/kg (Calculated) : 75.3 HEPARIN  DW (KG): 85.1  Vital Signs: Temp: 98.4 F (36.9 C) (01/12 0551) Temp Source: Oral (01/11 2300) BP: 140/70 (01/12 0551) Pulse Rate: 101 (01/12 0551)  Labs: Recent Labs    05/22/24 0813 05/22/24 1418 05/23/24 0612 05/24/24 0554  HGB 8.8*  --  8.4* 8.3*  HCT 26.6*  --  25.4* 24.7*  PLT 276  --  271 294  LABPROT 23.4*  --  21.8*  --   INR 2.0*  --  1.8*  --   HEPARINUNFRC 0.49 0.50 0.49 0.24*  CREATININE 0.73  --   --  0.83    Estimated Creatinine Clearance: 68 mL/min (by C-G formula based on SCr of 0.83 mg/dL).  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 Warfarin reversed with vitamin K po 2.5mg  x1. Pharmacy consulted for heparin  when INR < 2.5, started 1/9.    Heparin  level was therapeutic x2 on 1350 units/hr. No overt bleeding noted, Hgb down 8s, platelets are normal, left leg hematoma without evidence of compartment syndrome. Per Ortho, planning I&D 1/14 and 1/16. Heparin  empirically decreased to 1300, now heparin  level under goal 0.24. Will increase back to 1350 units/hr, f/u need to hold heparin  prior to procedures.   Goal of Therapy:  Heparin  level 0.3-0.5 units/ml INR 2.5- 3.5 Monitor platelets by anticoagulation protocol: Yes   Plan:  Increase heparin  infusion at 1350 units/hr Check heparin  level in 8 hours and daily while on heparin  Continue to monitor H&H and platelets F/u restart warfarin when ok with Ortho  Thank you for allowing pharmacy to be a part of this patients care.  Shelba Collier, PharmD, BCPS Clinical Pharmacist     [1]  Allergies Allergen Reactions   Alendronate Other (See Comments)    GERD   Aspirin     too  much bruising when added to Coumadin    Cipro [Ciprofloxacin Hcl] Other (See Comments)    Heartburn    Sulfa Drugs Cross Reactors Hives

## 2024-05-24 NOTE — Progress Notes (Signed)
" °  Progress Note   Patient: Terry [name] FMW:[id number] DOB: 01/23/1936 DOA: 05/20/2024     4 DOS: the patient was seen and examined on 05/24/2024   Brief hospital course: 89 y.o. male with medical history significant of chronic anticoagulation secondary to mitral valve replacement with mechanical valve, essential hypertension, hyperlipidemia, history of BPH, who apparently bumped his left leg on Christmas Day when he hit the flowerpot.  He bruised his left face and then left shin.  Patient has been having intermittent spasm and pain that is getting worse.  Came to the ER where his left lower extremity is swollen.  The leg is tender and warm.  He was found to have significant hematoma.  Patient was evaluated by orthopedics for possible compartmental syndrome but ruled out.  Continues to have severe pain as high as 9 out of 10.  At this point recommendation is to admit the patient for pain management.  Orthopedics to follow.  No fractures.   Assessment and Plan: #1 left lower extremity hematoma:  -This was traumatic after hitting his leg.   -Noted to have supratherapeutic INR initially.   -Orthopedics is consulted. Recs for continued ACE compression, elevation, ice with aggressive analgesia. -Per Orthopedic Surgery, blistering appears to be worsening. Orthopedics discussed case with IR. -Discussed case with Cardiology on 1/11. Given pt's mechanical mitral valve, he will need therapeutic anticoagulation given very high risk of thrombosis. Discussed with pharmacy with plan to keep heparin  at lowest therapeutic level. -Pt seen by Dr. Harden with recs for debridement Wed and Fri -Pt is on empiric zosyn . Added linezolid  for empiric MRSA coverage given the nature of pt's wound   #2 supratherapeutic INR: Patient is status post mechanical valve replacement and requires anticoagulation.   -Warfarin has been held -Pt is continued on heparin  bridge with goal of keeping at lowest therapeutic level   #3  essential hypertension:  -continued toprol  XL 50mg    #4 history of mitral valve replacement:  -On chronic anticoagulation.  Continue as per above   #5 hyperlipidemia:  -hold statin given elevated LFT   #6 acute blood loss anemia:  -Continue to monitor H&H      Subjective: .Still complaining of LLE pain. States pain is improved with current analgesia  Physical Exam: Vitals:   05/23/24 2110 05/23/24 2300 05/24/24 0551 05/24/24 1058  BP: 122/67 131/71 (!) 140/70 (!) 130/51  Pulse: 97 95 (!) 101 95  Resp: 18 20 17 18   Temp: 98 F (36.7 C) 98.1 F (36.7 C) 98.4 F (36.9 C)   TempSrc: Oral Oral    SpO2: 99% 97% 94% 95%  Weight:      Height:       General exam: Conversant, in no acute distress Respiratory system: normal chest rise, clear, no audible wheezing Cardiovascular system: regular rhythm, s1-s2 Gastrointestinal system: Nondistended, nontender, pos BS Central nervous system: No seizures, no tremors Extremities: LLE with wrap in place Skin: area of erythema around dressing Psychiatry: Affect normal // mood appears normal  Data Reviewed:  Labs reviewed: WBC 16.7, Hgb 8.4, Plts 271  Family Communication: Pt in room, family not at bedside  Disposition: Status is: Inpatient Remains inpatient appropriate because: severity of illness  Planned Discharge Destination: Home     Author: Garnette Pelt, MD 05/24/2024 3:49 PM  For on call review [email or web address].  "

## 2024-05-24 NOTE — Progress Notes (Signed)
 PHARMACY - ANTICOAGULATION CONSULT NOTE  Pharmacy Consult for heparin  Indication: mech MVR (warfarin PTA on hold)  Allergies[1]  Patient Measurements: Height: 5' 11 (180.3 cm) Weight: 85.1 kg (187 lb 9.8 oz) IBW/kg (Calculated) : 75.3 HEPARIN  DW (KG): 85.1  Vital Signs: BP: 114/52 (01/12 1806) Pulse Rate: 91 (01/12 1806)  Labs: Recent Labs    05/22/24 0813 05/22/24 1418 05/23/24 0612 05/24/24 0554 05/24/24 1938  HGB 8.8*  --  8.4* 8.3*  --   HCT 26.6*  --  25.4* 24.7*  --   PLT 276  --  271 294  --   LABPROT 23.4*  --  21.8*  --   --   INR 2.0*  --  1.8*  --   --   HEPARINUNFRC 0.49   < > 0.49 0.24* 0.39  CREATININE 0.73  --   --  0.83  --   CKTOTAL  --   --   --  1,772*  --    < > = values in this interval not displayed.    Estimated Creatinine Clearance: 68 mL/min (by C-G formula based on SCr of 0.83 mg/dL).  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 Warfarin reversed with vitamin K po 2.5mg  x1. Pharmacy consulted for heparin  when INR < 2.5, started 1/9.    Hgb down 8s, platelets are normal, left leg hematoma without evidence of compartment syndrome. Per Ortho, planning I&D 1/14 and 1/16.   Heparin  level therapeutic (0.39) on infusion at 1350 units/hr. No bleeding noted.  Goal of Therapy:  Heparin  level 0.3-0.5 units/ml INR 2.5- 3.5 Monitor platelets by anticoagulation protocol: Yes   Plan:  Continue heparin  infusion at 1350 units/hr Daily heparin  level and CBC  Thank you for allowing pharmacy to be a part of this patients care.  Vito Ralph, PharmD, BCPS Please see amion for complete clinical pharmacist phone list 05/24/2024 8:22 PM       [1]  Allergies Allergen Reactions   Alendronate Other (See Comments)    GERD   Aspirin     too much bruising when added to Coumadin    Cipro [Ciprofloxacin Hcl] Other (See Comments)    Heartburn    Sulfa Drugs Cross Reactors Hives

## 2024-05-24 NOTE — Consult Note (Signed)
 "   ORTHOPAEDIC CONSULTATION  REQUESTING PHYSICIAN: Cindy Garnette POUR, MD  Chief Complaint: Expanding hematoma left leg.  HPI: Terry [name] is a 89 y.o. male who presents with expanding hematoma left leg.  Patient has a mechanical valve and has been on Coumadin .  Patient states he sustained blunt trauma on December 25 and has had a progressive expanding hematoma since that time.  During patient's hospitalization he has been on heparin .  Past Medical History:  Diagnosis Date   Benign prostatic hypertrophy    Dyslipidemia    Hyperlipidemia    Long-term (current) use of anticoagulants    Mitral valve prolapse    Osteoporosis    Ventricular tachycardia [facility])    Past Surgical History:  Procedure Laterality Date   APPENDICO-VESICOSTOMY CUTANEOUS     CARDIAC CATHETERIZATION  08/19/2001   Essentially smooth and normal coronary arteries   CORONARY STENT PLACEMENT  11/03/2001   Mitral valve replacement with #35 [employer] mechanical mitral valve prosthesis and closure of  left atrial appendage   Social History   Socioeconomic History   Marital status: Single    Spouse name: Not on file   Number of children: Not on file   Years of education: Not on file   Highest education level: Not on file  Occupational History   Not on file  Tobacco Use   Smoking status: Former    Current packs/day: 0.00    Types: Cigarettes    Quit date: 05/13/1964    Years since quitting: 60.0   Smokeless tobacco: Never  Substance and Sexual Activity   Alcohol use: No   Drug use: No   Sexual activity: Not on file  Other Topics Concern   Not on file  Social History Narrative   Not on file   Social Drivers of Health   Tobacco Use: Medium Risk (05/20/2024)   Patient History    Smoking Tobacco Use: Former    Smokeless Tobacco Use: Never    Passive Exposure: Not on Actuary Strain: Not on file  Food Insecurity: No Food Insecurity (05/20/2024)   Epic    Worried About Brewing Technologist in the Last Year: Never true    Ran Out of Food in the Last Year: Never true  Transportation Needs: No Transportation Needs (05/20/2024)   Epic    Lack of Transportation (Medical): No    Lack of Transportation (Non-Medical): No  Physical Activity: Not on file  Stress: Not on file  Social Connections: Socially Isolated (05/20/2024)   Social Connection and Isolation Panel    Frequency of Communication with Friends and Family: More than three times a week    Frequency of Social Gatherings with Friends and Family: More than three times a week    Attends Religious Services: Never    Database Administrator or Organizations: No    Attends Banker Meetings: Never    Marital Status: Never married  Depression (PHQ2-9): Not on file  Alcohol Screen: Not on file  Housing: Low Risk (05/20/2024)   Epic    Unable to Pay for Housing in the Last Year: No    Number of Times Moved in the Last Year: 0    Homeless in the Last Year: No  Utilities: Not At Risk (05/20/2024)   Epic    Threatened with loss of utilities: No  Health Literacy: Not on file   History reviewed. No pertinent family history. - negative except otherwise stated in  the family history section Allergies[1] Prior to Admission medications  Medication Sig Start Date End Date Taking? Authorizing Provider  acetaminophen  (TYLENOL ) 500 MG tablet Take 1 tablet (500 mg total) by mouth every 6 (six) hours as needed for mild pain, moderate pain or headache. Patient taking differently: Take 1,000 mg by mouth 2 (two) times daily as needed for mild pain (pain score 1-3), moderate pain (pain score 4-6) or headache. 12/24/21  Yes Elgergawy, Brayton RAMAN, MD  aspirin EC 81 MG tablet Take 81 mg by mouth daily.   Yes [provider]  dorzolamide -timolol  (COSOPT ) 2-0.5 % ophthalmic solution Place 1 drop into both eyes 2 (two) times daily.   Yes [provider]  metoprolol  succinate (TOPROL -XL) 50 MG 24 hr tablet Take 50 mg by mouth  daily.   Yes [provider]  multivitamin [facility]) per tablet Take 1 tablet by mouth daily.   Yes [provider]  ofloxacin (FLOXIN) 0.3 % OTIC solution Place 4 drops into the right ear 2 (two) times daily. 12/05/23  Yes [provider]  omeprazole (PRILOSEC) 20 MG capsule Take 20 mg by mouth every other day.   Yes [provider]  rosuvastatin  (CRESTOR ) 20 MG tablet Take 20 mg by mouth daily.   Yes [provider]  Tafluprost , PF, 0.0015 % SOLN Place 1 drop into both eyes at bedtime.   Yes [provider]  warfarin (COUMADIN ) 5 MG tablet Take 1/2 a tablet to 1 tablet by mouth daily as directed by the coumadin  clinic Patient taking differently: Take 2.5-5 mg by mouth in the morning. Take 2.5 mg (half tablet) Mon/Wed/Fri and take 5 mg (1 tablet) Tues/Thurs/Sat/Sun or as directed by anticoagulation clinic 04/26/24  Yes Anner Alm ORN, MD   CT ANGIO LOWER EXT BILAT W &/OR WO CONTRAST Result Date: 05/23/2024 EXAM: CTA BILATERAL LOWER EXTREMITY 05/23/2024 08:19:00 PM TECHNIQUE: Contrast-enhanced computed tomography angiography of the lower extremity was performed with multiplanar reconstructions. Maximum intensity projection images were created on a separate workstation and reviewed. Automated exposure control, iterative reconstruction, and/or weight based adjustment of the mA/kV was utilized to reduce the radiation dose to as low as reasonably achievable. iohexol  (OMNIPAQUE ) 350 MG/ML injection was administered. COMPARISON: None available. CLINICAL HISTORY: Claudication or leg ischemia. FINDINGS: ARTERIAL: Limitations: Distal abdominal aorta, common iliac arteries, and proximal internal and external iliac arteries are excluded from view. Visualized internal iliac arteries are widely patent bilaterally. Right Lower Extremity: RIGHT COMMON ILIAC ARTERY: Not visualized. RIGHT EXTERNAL ILIAC ARTERY: Proximal portion not visualized. Visualized  portions of the right external iliac artery are widely patent. COMMON FEMORAL ARTERY: Widely patent. SUPERFICIAL FEMORAL ARTERY: Widely patent. POPLITEAL ARTERY: Widely patent. TIBIOPERONEAL TRUNK: Widely patent. ANTERIOR TIBIAL ARTERY: Widely patent. Flow is identified in the anterior tibial artery to the ankle. The right dorsalis pedis artery is diminutive and patency is not confirmed on this examination. PERONEAL ARTERY: Widely patent. Flow is identified to the ankle. POSTERIOR TIBIAL ARTERY: Widely patent. Posterior tibial artery flow is present into the hindfoot. The plantar arch is patent. Left Lower Extremity: LEFT COMMON ILIAC ARTERY: Not visualized. LEFT EXTERNAL ILIAC ARTERY: Proximal portion not visualized. Visualized portions of the left external iliac artery are widely patent. COMMON FEMORAL ARTERY: Widely patent. SUPERFICIAL FEMORAL ARTERY: Widely patent. POPLITEAL ARTERY: Widely patent. TIBIOPERONEAL TRUNK: Widely patent. ANTERIOR TIBIAL ARTERY: Widely patent. Flow is identified in the anterior tibial artery to the ankle. Dorsalis pedis is patent. PERONEAL ARTERY: Widely patent. Flow is identified to  the ankle. POSTERIOR TIBIAL ARTERY: Widely patent. Posterior tibial artery flow is present into the hindfoot. Plantar arch is patent. BONES AND SOFT TISSUES: Mild subcutaneous edema noted within the posterolateral subcutaneous soft tissues of the left thigh in keeping with superficial contusion. Edema is seen within the posterior muscular compartment surrounding the flexor musculature and popliteal vasculature without mass-forming hematoma identified in this region. Within the infrapopliteal left lower extremity, there are subcutaneous hematoma seen anterolaterally measuring 2.6 x 7.2 x 14.9 cm (volume 144.5 cm\S\3) and posterolaterally measuring 4.2 x 10.2 x 17.5 cm (volume 388.9 cm\S\3). Hematoma demonstrate marked mass effect upon the superficial posterior and anterior compartment musculature but  remained confined to the subcutaneous compartment. Moderate subcutaneous edema noted within the peripheral left lower extremity. Moderate right prepatellar subcutaneous edema. INCIDENTAL ABDOMINAL FINDINGS: Moderate sigmoid diverticulosis. Marked prostatic hypertrophy. IMPRESSION: 1. Right lower extremity arterial inflow is widely patent. 2. Right lower extremity arterial outflow is widely patent. 3. Right lower extremity runoff demonstrates 3-vessel runoff to the ankle with diminutive dorsalis pedis artery and patency not confirmed, and a patent plantar arch. 4. Left lower extremity arterial inflow is widely patent. 5. Left lower extremity arterial outflow is widely patent. 6. Left lower extremity runoff demonstrates 3-vessel runoff to the ankle with patent dorsalis pedis artery and plantar arch, with large infrapopliteal subcutaneous hematomas and associated left lower extremity edema/contusion as described, with moderate sigmoid diverticulosis and marked prostatic hypertrophy. 7. Multiple hematomas within the left lower extremity as outlined above demonstrating significant mass effect upon the subjacent musculature of the superficial posterior and anterior compartments. Electronically signed by: Dorethia Molt MD MD 05/23/2024 08:53 PM EST RP Workstation: [id number]   - pertinent xrays, CT, MRI studies were reviewed and independently interpreted  Positive ROS: All other systems have been reviewed and were otherwise negative with the exception of those mentioned in the HPI and as above.  Physical Exam: General: Alert, no acute distress Psychiatric: Patient is competent for consent with normal mood and affect Lymphatic: No axillary or cervical lymphadenopathy Cardiovascular: No pedal edema Respiratory: No cyanosis, no use of accessory musculature GI: No organomegaly, abdomen is soft and non-tender    Images:  @ENCIMAGES @  Labs:  Lab Results  Component Value Date   REPTSTATUS PENDING  05/20/2024   REPTSTATUS PENDING 05/20/2024   GRAMSTAIN NO WBC SEEN NO ORGANISMS SEEN  05/20/2024   CULT  05/20/2024    NO GROWTH 3 DAYS Performed at [facility], [address] [place], [place] [address]    CULT  05/20/2024    NO GROWTH 3 DAYS Performed at [facility], [address], [place], [place] [address]     Lab Results  Component Value Date   ALBUMIN 2.9 (L) 05/24/2024   ALBUMIN 3.2 (L) 05/22/2024   ALBUMIN 3.6 05/21/2024        Latest Ref Rng & Units 05/24/2024    5:54 AM 05/23/2024    6:12 AM 05/22/2024    8:13 AM  CBC EXTENDED  WBC 4.0 - 10.5 K/uL 15.2  16.7  15.1   RBC 4.22 - 5.81 MIL/uL 2.57  2.64  2.76   Hemoglobin 13.0 - 17.0 g/dL 8.3  8.4  8.8   HCT 60.9 - 52.0 % 24.7  25.4  26.6   Platelets 150 - 400 K/uL 294  271  276     Neurologic: Patient does not have protective sensation bilateral lower extremities.   MUSCULOSKELETAL:   Skin: Examination patient has clear  weeping edema with swelling in the left leg with a hematoma posterior and laterally.  Patient has a strong palpable dorsalis pedis pulse.  Dorsiflexion of the ankle is painful.  LRINEC score of 5 without the C-reactive protein  Hemoglobin 8.3 with a white cell count of 15.2.  Assessment: Assessment: Expanding hematoma left leg.  Plan: Plan: Will plan for debridement of the hematoma Wednesday and Friday.  Thank you for the consult and the opportunity to see Mr. Marcelus Dubberly, MD [facility] [phone] 8:16 AM        [1]  Allergies Allergen Reactions   Alendronate Other (See Comments)    GERD   Aspirin     too much bruising when added to Coumadin    Cipro [Ciprofloxacin Hcl] Other (See Comments)    Heartburn    Sulfa Drugs Cross Reactors Hives   "

## 2024-05-24 NOTE — Progress Notes (Signed)
" ° °  Subjective:  89 y.o. male now hospital day 4 for spontaneous left subcutaneous extra fascial lower extremity hematoma. Patient reports that he has some numbness of the lateral foot and continues to have discomfort over the lateral calf. His pain is controlled with the current regimen  Otherwise, no acute events overnight. No numbness or tingling to the extremity.  Objective:   VITALS:   Vitals:   05/23/24 1725 05/23/24 2110 05/23/24 2300 05/24/24 0551  BP: (!) 125/55 122/67 131/71 (!) 140/70  Pulse: (!) 101 97 95 (!) 101  Resp: 17 18 20 17   Temp: 98.2 F (36.8 C) 98 F (36.7 C) 98.1 F (36.7 C) 98.4 F (36.9 C)  TempSrc:  Oral Oral   SpO2: 93% 99% 97% 94%  Weight:      Height:        Physical Exam: General: Alert, no acute distress Cardiovascular: No pedal edema Respiratory: No cyanosis, no use of accessory musculature GI: No organomegaly, abdomen is soft and non-tender Skin: No lesions in the area of chief complaint Neurologic: Sensation intact distally Psychiatric: Patient is competent for consent with normal mood and affect Lymphatic: No axillary or cervical lymphadenopathy  MUSCULOSKELETAL: Left lower extremity - Skin swollen with blistering of the anterolateral and posterlateral calf - decreased sensation over the blistering calf - tender calf laterally - tissues full, but compressible - able to passively stretch ankle and bring knee throughout range of motion without pain out of proportion.  Some increased discomfort with dorsiflexion to neutral and inversion. - Sensation intact to light touch saphenous, tibial, deep and superficial peroneal nerve distributions. Mildly decreased sensation to the sural nerve distribution. - 2+ DP and PT pulses  Lab Results  Component Value Date   WBC 15.2 (H) 05/24/2024   HGB 8.3 (L) 05/24/2024   HCT 24.7 (L) 05/24/2024   MCV 96.1 05/24/2024   PLT 294 05/24/2024   BMET    Component Value Date/Time   NA 130 (L) 05/24/2024  0554   K 4.0 05/24/2024 0554   CL 99 05/24/2024 0554   CO2 22 05/24/2024 0554   GLUCOSE 92 05/24/2024 0554   BUN 21 05/24/2024 0554   CREATININE 0.83 05/24/2024 0554   CALCIUM  8.1 (L) 05/24/2024 0554   GFRNONAA >60 05/24/2024 0554     Assessment/Plan: 89 y.o. male    Principal Problem:   Hematoma Active Problems:   H/O mitral valve replacement with mechanical valve   Chronic anticoagulation   Acute blood loss anemia   Essential hypertension   Hyperlipidemia The patient has an expanding hematoma of the lateral extrafascial subcutaneous tissues of the left calf.  At this time he has failed to improve with compression, elevation and ice given his need to be continually anticoagulated for his mitral valve. Discussion was had with Jerona Sage, MD regarding the patient and he will plan to take the patient to the OR on Wednesday for incision and drainage with coagulation of the bleed.  Orthopedic care will be transferred to Dr. Sage.  Pain control: oxycodone  and tylenol  PT/OT DVT ppx: heparin  Prophylactic zosyn  Plan for I&D with Dr. Sage on Wednesday. Likely will require repeated trips to the operating room Bowel regimen: docusate Continue to elevate and ice Dispo: pending OR on wednesday   Rankin LELON Pizza 05/24/2024, 8:17 AM  "

## 2024-05-24 NOTE — Care Management Important Message (Signed)
 Important Message  Patient Details  Name: Terry [name] MRN: [id number] Date of Birth: 06-13-1935   Important Message Given:  Yes - Medicare IM     Jennie Laneta Dragon 05/24/2024, 1:03 PM

## 2024-05-25 LAB — CBC
HCT: 22.1 % — ABNORMAL LOW (ref 39.0–52.0)
Hemoglobin: 7.4 g/dL — ABNORMAL LOW (ref 13.0–17.0)
MCH: 32.5 pg (ref 26.0–34.0)
MCHC: 33.5 g/dL (ref 30.0–36.0)
MCV: 96.9 fL (ref 80.0–100.0)
Platelets: 309 K/uL (ref 150–400)
RBC: 2.28 MIL/uL — ABNORMAL LOW (ref 4.22–5.81)
RDW: 13.7 % (ref 11.5–15.5)
WBC: 13.5 K/uL — ABNORMAL HIGH (ref 4.0–10.5)
nRBC: 0 % (ref 0.0–0.2)

## 2024-05-25 LAB — COMPREHENSIVE METABOLIC PANEL WITH GFR
ALT: 58 U/L — ABNORMAL HIGH (ref 0–44)
AST: 73 U/L — ABNORMAL HIGH (ref 15–41)
Albumin: 2.8 g/dL — ABNORMAL LOW (ref 3.5–5.0)
Alkaline Phosphatase: 67 U/L (ref 38–126)
Anion gap: 10 (ref 5–15)
BUN: 22 mg/dL (ref 8–23)
CO2: 22 mmol/L (ref 22–32)
Calcium: 8 mg/dL — ABNORMAL LOW (ref 8.9–10.3)
Chloride: 99 mmol/L (ref 98–111)
Creatinine, Ser: 0.8 mg/dL (ref 0.61–1.24)
GFR, Estimated: >60 mL/min
Glucose, Bld: 101 mg/dL — ABNORMAL HIGH (ref 70–99)
Potassium: 3.5 mmol/L (ref 3.5–5.1)
Sodium: 131 mmol/L — ABNORMAL LOW (ref 135–145)
Total Bilirubin: 0.5 mg/dL (ref 0.0–1.2)
Total Protein: 5.8 g/dL — ABNORMAL LOW (ref 6.5–8.1)

## 2024-05-25 LAB — HEMOGLOBIN AND HEMATOCRIT, BLOOD
HCT: 27.4 % — ABNORMAL LOW (ref 39.0–52.0)
Hemoglobin: 9.1 g/dL — ABNORMAL LOW (ref 13.0–17.0)

## 2024-05-25 LAB — AEROBIC CULTURE W GRAM STAIN (SUPERFICIAL SPECIMEN)
Culture: NO GROWTH
Gram Stain: NONE SEEN

## 2024-05-25 LAB — HEPARIN LEVEL (UNFRACTIONATED): Heparin Unfractionated: 0.4 [IU]/mL (ref 0.30–0.70)

## 2024-05-25 LAB — MRSA NEXT GEN BY PCR, NASAL: MRSA by PCR Next Gen: NOT DETECTED

## 2024-05-25 LAB — PREPARE RBC (CROSSMATCH)

## 2024-05-25 LAB — CULTURE, BLOOD (ROUTINE X 2)
Culture: NO GROWTH
Culture: NO GROWTH
Special Requests: ADEQUATE
Special Requests: ADEQUATE

## 2024-05-25 MED ORDER — CHLORHEXIDINE GLUCONATE 4 % EX SOLN
60.0000 mL | Freq: Once | CUTANEOUS | Status: AC
  Administered 2024-05-26: 4 via TOPICAL
  Filled 2024-05-25: qty 60

## 2024-05-25 MED ORDER — SODIUM CHLORIDE 0.9% IV SOLUTION: Freq: Once | INTRAVENOUS | Status: AC

## 2024-05-25 MED ORDER — POVIDONE-IODINE 10 % EX SWAB
2.0000 | Freq: Once | CUTANEOUS | Status: AC
  Administered 2024-05-26: 2 via TOPICAL

## 2024-05-25 NOTE — Progress Notes (Signed)
" °  Progress Note   Patient: Terry [name] FMW:[id number] DOB: 03/26/1936 DOA: 05/20/2024     5 DOS: the patient was seen and examined on 05/25/2024   Brief hospital course: 89 y.o. male with medical history significant of chronic anticoagulation secondary to mitral valve replacement with mechanical valve, essential hypertension, hyperlipidemia, history of BPH, who apparently bumped his left leg on Christmas Day when he hit the flowerpot.  He bruised his left face and then left shin.  Patient has been having intermittent spasm and pain that is getting worse.  Came to the ER where his left lower extremity is swollen.  The leg is tender and warm.  He was found to have significant hematoma.  Patient was evaluated by orthopedics for possible compartmental syndrome but ruled out.  Continues to have severe pain as high as 9 out of 10.  At this point recommendation is to admit the patient for pain management.  Orthopedics to follow.  No fractures.   Assessment and Plan: #1 left lower extremity hematoma:  -This was traumatic after hitting his shin on a christmas decoration -Noted to have supratherapeutic INR initially.   -Per Orthopedic Surgery, blistering appears to be worsening.  -Pt seen by Dr. Harden with recs for debridement Wed and Fri -Pt is on empiric zosyn . Added linezolid  for empiric MRSA coverage given the nature of pt's wound   #2 supratherapeutic INR: Patient is status post mechanical valve replacement and requires anticoagulation.   -Warfarin remains on hold, continue heparin  bridge with goal of keeping at lowest therapeutic level   #3 essential hypertension:  -continued toprol  XL 50mg    #4 history of mitral valve replacement:  -On chronic anticoagulation.  Continue as per above   #5 hyperlipidemia:  -hold statin given elevated LFT   #6 acute blood loss anemia:  -Continue to monitor H&H -1 unit PRBC given 1/13      Subjective: .Reports marked pain when LLE is being manipulated,  otherwise comfortable when at rest  Physical Exam: Vitals:   05/25/24 0927 05/25/24 1023 05/25/24 1309 05/25/24 1330  BP: 117/64 (!) 121/54 134/62 127/60  Pulse: 86 75 79 79  Resp:  14 19 20   Temp:  97.9 F (36.6 C) 97.9 F (36.6 C) (!) 97.5 F (36.4 C)  TempSrc:  Oral Oral Oral  SpO2:  96% 97% 96%  Weight:      Height:       General exam: Awake, laying in bed, in nad Respiratory system: Normal respiratory effort, no wheezing Cardiovascular system: regular rate, s1, s2 Gastrointestinal system: Soft, nondistended, positive BS Central nervous system: CN2-12 grossly intact, strength intact Extremities: LLE with dressing in place Skin: Normal skin turgor, no notable skin lesions seen Psychiatry: Mood normal // no visual hallucinations   Data Reviewed:  Labs reviewed: Na 131, K 3.5, Cr 0.80, WBC 13.5, Hgb 7.4, Plts 309  Family Communication: Pt in room, family not at bedside  Disposition: Status is: Inpatient Remains inpatient appropriate because: severity of illness  Planned Discharge Destination: Home     Author: Garnette Pelt, MD 05/25/2024 4:46 PM  For on call review [email or web address].  "

## 2024-05-25 NOTE — H&P (View-Only) (Signed)
 Patient ID: Terry [name], male   DOB: August 16, 1935, 89 y.o.   MRN: [id number] Patient is seen in follow-up for hematoma left calf.  His C-reactive protein is 19.6.  His hemoglobin is dropped to 7.4 and his white cell count has dropped to 13.5.  Patient remains afebrile.  Plan for left calf debridement tomorrow Wednesday.  Will transfuse with 1 unit of packed red blood cells prior to surgery.

## 2024-05-25 NOTE — Plan of Care (Signed)

## 2024-05-25 NOTE — Progress Notes (Signed)
 PHARMACY - ANTICOAGULATION CONSULT NOTE  Pharmacy Consult for heparin  Indication: mech MVR (warfarin PTA on hold)  Allergies[1]  Patient Measurements: Height: 5' 11 (180.3 cm) Weight: 85.1 kg (187 lb 9.8 oz) IBW/kg (Calculated) : 75.3 HEPARIN  DW (KG): 85.1  Vital Signs: Temp: 97.6 F (36.4 C) (01/13 0538) Temp Source: Oral (01/13 0538) BP: 128/60 (01/13 0538) Pulse Rate: 90 (01/13 0538)  Labs: Recent Labs    05/22/24 0813 05/22/24 1418 05/23/24 0612 05/24/24 0554 05/24/24 1938 05/25/24 0550  HGB 8.8*  --  8.4* 8.3*  --  7.4*  HCT 26.6*  --  25.4* 24.7*  --  22.1*  PLT 276  --  271 294  --  309  LABPROT 23.4*  --  21.8*  --   --   --   INR 2.0*  --  1.8*  --   --   --   HEPARINUNFRC 0.49   < > 0.49 0.24* 0.39 0.40  CREATININE 0.73  --   --  0.83  --  0.80  CKTOTAL  --   --   --  1,772*  --   --    < > = values in this interval not displayed.    Estimated Creatinine Clearance: 70.6 mL/min (by C-G formula based on SCr of 0.8 mg/dL).  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 Warfarin reversed with vitamin K po 2.5mg  x1. Pharmacy consulted for heparin  when INR < 2.5, started 1/9.    Heparin  level therapeutic x2 on 1350 units/hr. No overt bleeding noted, Hgb down 8s, platelets are normal, left leg hematoma without evidence of compartment syndrome. Per Ortho, planning I&D 1/14 and 1/16, f/u need to hold heparin  prior to procedures.   Goal of Therapy:  Heparin  level 0.3-0.5 units/ml INR 2.5- 3.5 Monitor platelets by anticoagulation protocol: Yes   Plan:  Continue heparin  infusion at 1350 units/hr Check heparin  level in 8 hours and daily while on heparin  Continue to monitor H&H and platelets F/u restart warfarin when ok with Ortho  Thank you for allowing pharmacy to be a part of this patients care.  Shelba Collier, PharmD, BCPS Clinical Pharmacist    [1]  Allergies Allergen  Reactions   Alendronate Other (See Comments)    GERD   Aspirin     too much bruising when added to Coumadin    Cipro [Ciprofloxacin Hcl] Other (See Comments)    Heartburn    Sulfa Drugs Cross Reactors Hives

## 2024-05-25 NOTE — Progress Notes (Signed)
 Patient ID: Terry [name], male   DOB: August 16, 1935, 89 y.o.   MRN: [id number] Patient is seen in follow-up for hematoma left calf.  His C-reactive protein is 19.6.  His hemoglobin is dropped to 7.4 and his white cell count has dropped to 13.5.  Patient remains afebrile.  Plan for left calf debridement tomorrow Wednesday.  Will transfuse with 1 unit of packed red blood cells prior to surgery.

## 2024-05-25 NOTE — Progress Notes (Signed)
" ° ° °  PROCEDURAL EXPEDITER PROGRESS NOTE  Patient Name: Terry [name]  DOB:06-03-1935 Date of Admission: 05/20/2024  Date of Assessment:05/25/2024   -------------------------------------------------------------------------------------------------------------------   Brief clinical summary: pt is a 89 yr old male pt having surgery tomorrw 05/26/2024 for I&D of deep abscess left calf  Orders in place:  Yes   Communication with surgical team if no orders: no  Labs, test, and orders reviewed: yes  Requires surgical clearance:  No  What type of clearance: n/a  Clearance received: n/a  Barriers noted:n/a   Intervention provided by [facility] team: n/a  Barrier resolved:  not applicable   -------------------------------------------------------------------------------------------------------------------  [employer], Ronal DELENA Bald Please contact us  directly via secure chat (search for [facility]) or by calling us  at [phone] [facility]).  "

## 2024-05-26 ENCOUNTER — Encounter (HOSPITAL_COMMUNITY): Payer: Self-pay | Admitting: Internal Medicine

## 2024-05-26 ENCOUNTER — Inpatient Hospital Stay (HOSPITAL_COMMUNITY): Admitting: Anesthesiology

## 2024-05-26 ENCOUNTER — Encounter (HOSPITAL_COMMUNITY)
Admission: EM | Disposition: A | Discharge status: Skilled Nursing Facility | Payer: Self-pay | Source: Home / Self Care | Attending: Family Medicine

## 2024-05-26 ENCOUNTER — Encounter (HOSPITAL_COMMUNITY): Admitting: Anesthesiology

## 2024-05-26 ENCOUNTER — Other Ambulatory Visit: Payer: Self-pay

## 2024-05-26 DIAGNOSIS — S8012XD Contusion of left lower leg, subsequent encounter: Secondary | ICD-10-CM

## 2024-05-26 DIAGNOSIS — M79A22 Nontraumatic compartment syndrome of left lower extremity: Secondary | ICD-10-CM

## 2024-05-26 DIAGNOSIS — I10 Essential (primary) hypertension: Secondary | ICD-10-CM

## 2024-05-26 DIAGNOSIS — Z87891 Personal history of nicotine dependence: Secondary | ICD-10-CM | POA: Diagnosis not present

## 2024-05-26 DIAGNOSIS — T79A22A Traumatic compartment syndrome of left lower extremity, initial encounter: Secondary | ICD-10-CM

## 2024-05-26 DIAGNOSIS — D62 Acute posthemorrhagic anemia: Secondary | ICD-10-CM

## 2024-05-26 DIAGNOSIS — T148XXA Other injury of unspecified body region, initial encounter: Secondary | ICD-10-CM | POA: Diagnosis not present

## 2024-05-26 DIAGNOSIS — S8012XA Contusion of left lower leg, initial encounter: Secondary | ICD-10-CM | POA: Diagnosis not present

## 2024-05-26 DIAGNOSIS — Z952 Presence of prosthetic heart valve: Secondary | ICD-10-CM | POA: Diagnosis not present

## 2024-05-26 HISTORY — PX: INCISION AND DRAINAGE OF DEEP ABSCESS, CALF: SHX7361

## 2024-05-26 LAB — CBC
HCT: 25.7 % — ABNORMAL LOW (ref 39.0–52.0)
Hemoglobin: 8.5 g/dL — ABNORMAL LOW (ref 13.0–17.0)
MCH: 31.3 pg (ref 26.0–34.0)
MCHC: 33.1 g/dL (ref 30.0–36.0)
MCV: 94.5 fL (ref 80.0–100.0)
Platelets: 365 K/uL (ref 150–400)
RBC: 2.72 MIL/uL — ABNORMAL LOW (ref 4.22–5.81)
RDW: 15.5 % (ref 11.5–15.5)
WBC: 12.1 K/uL — ABNORMAL HIGH (ref 4.0–10.5)
nRBC: 0 % (ref 0.0–0.2)

## 2024-05-26 LAB — COMPREHENSIVE METABOLIC PANEL WITH GFR
ALT: 52 U/L — ABNORMAL HIGH (ref 0–44)
AST: 56 U/L — ABNORMAL HIGH (ref 15–41)
Albumin: 2.9 g/dL — ABNORMAL LOW (ref 3.5–5.0)
Alkaline Phosphatase: 68 U/L (ref 38–126)
Anion gap: 8 (ref 5–15)
BUN: 17 mg/dL (ref 8–23)
CO2: 24 mmol/L (ref 22–32)
Calcium: 8.2 mg/dL — ABNORMAL LOW (ref 8.9–10.3)
Chloride: 102 mmol/L (ref 98–111)
Creatinine, Ser: 0.8 mg/dL (ref 0.61–1.24)
GFR, Estimated: >60 mL/min
Glucose, Bld: 99 mg/dL (ref 70–99)
Potassium: 4 mmol/L (ref 3.5–5.1)
Sodium: 134 mmol/L — ABNORMAL LOW (ref 135–145)
Total Bilirubin: 0.5 mg/dL (ref 0.0–1.2)
Total Protein: 6 g/dL — ABNORMAL LOW (ref 6.5–8.1)

## 2024-05-26 LAB — HEPARIN LEVEL (UNFRACTIONATED): Heparin Unfractionated: 0.38 [IU]/mL (ref 0.30–0.70)

## 2024-05-26 MED ORDER — ORAL CARE MOUTH RINSE: 15.0000 mL | Freq: Once | OROMUCOSAL | Status: AC

## 2024-05-26 MED ORDER — VASHE WOUND IRRIGATION OPTIME
TOPICAL | Status: DC | PRN
  Administered 2024-05-26: 34 [oz_av]

## 2024-05-26 MED ORDER — ONDANSETRON HCL 4 MG/2ML IJ SOLN
INTRAMUSCULAR | Status: AC
  Filled 2024-05-26: qty 2

## 2024-05-26 MED ORDER — DEXAMETHASONE SOD PHOSPHATE PF 10 MG/ML IJ SOLN
INTRAMUSCULAR | Status: DC | PRN
  Administered 2024-05-26: 5 mg via INTRAVENOUS

## 2024-05-26 MED ORDER — LACTATED RINGERS IV SOLN: INTRAVENOUS | Status: DC

## 2024-05-26 MED ORDER — ONDANSETRON HCL 4 MG/2ML IJ SOLN
INTRAMUSCULAR | Status: DC | PRN
  Administered 2024-05-26: 4 mg via INTRAVENOUS

## 2024-05-26 MED ORDER — MEPERIDINE HCL 25 MG/ML IJ SOLN: 6.2500 mg | INTRAMUSCULAR | Status: DC | PRN

## 2024-05-26 MED ORDER — OXYCODONE HCL 5 MG PO TABS: 5.0000 mg | ORAL_TABLET | Freq: Once | ORAL | Status: DC | PRN

## 2024-05-26 MED ORDER — FENTANYL CITRATE (PF) 100 MCG/2ML IJ SOLN
INTRAMUSCULAR | Status: AC
  Filled 2024-05-26: qty 2

## 2024-05-26 MED ORDER — HEPARIN (PORCINE) 25000 UT/250ML-% IV SOLN
1350.0000 [IU]/h | INTRAVENOUS | Status: DC
  Administered 2024-05-27 – 2024-05-28 (×2): 1350 [IU]/h via INTRAVENOUS
  Filled 2024-05-26 (×2): qty 250

## 2024-05-26 MED ORDER — ONDANSETRON HCL 4 MG/2ML IJ SOLN: 4.0000 mg | Freq: Once | INTRAMUSCULAR | Status: DC | PRN

## 2024-05-26 MED ORDER — VANCOMYCIN HCL 1000 MG IV SOLR
INTRAVENOUS | Status: DC | PRN
  Administered 2024-05-26: 1000 mg via TOPICAL

## 2024-05-26 MED ORDER — PROPOFOL 10 MG/ML IV BOLUS
INTRAVENOUS | Status: DC | PRN
  Administered 2024-05-26: 100 mg via INTRAVENOUS

## 2024-05-26 MED ORDER — CHLORHEXIDINE GLUCONATE 0.12 % MT SOLN
OROMUCOSAL | Status: AC
  Administered 2024-05-26: 15 mL via OROMUCOSAL
  Filled 2024-05-26: qty 15

## 2024-05-26 MED ORDER — VANCOMYCIN HCL 1000 MG IV SOLR
INTRAVENOUS | Status: AC
  Filled 2024-05-26: qty 20

## 2024-05-26 MED ORDER — TOBRAMYCIN SULFATE 1.2 G IJ SOLR
INTRAMUSCULAR | Status: DC | PRN
  Administered 2024-05-26: 1.2 g via TOPICAL

## 2024-05-26 MED ORDER — TOBRAMYCIN SULFATE 1.2 G IJ SOLR
INTRAMUSCULAR | Status: AC
  Filled 2024-05-26: qty 1.2

## 2024-05-26 MED ORDER — FENTANYL CITRATE (PF) 100 MCG/2ML IJ SOLN
INTRAMUSCULAR | Status: DC | PRN
  Administered 2024-05-26 (×4): 25 ug via INTRAVENOUS

## 2024-05-26 MED ORDER — CHLORHEXIDINE GLUCONATE 0.12 % MT SOLN: 15.0000 mL | Freq: Once | OROMUCOSAL | Status: AC

## 2024-05-26 MED ORDER — 0.9 % SODIUM CHLORIDE (POUR BTL) OPTIME
TOPICAL | Status: DC | PRN
  Administered 2024-05-26: 1000 mL

## 2024-05-26 MED ORDER — LIDOCAINE HCL (PF) 1 % IJ SOLN
INTRAMUSCULAR | Status: AC
  Filled 2024-05-26: qty 30

## 2024-05-26 MED ORDER — LIDOCAINE 2% (20 MG/ML) 5 ML SYRINGE
INTRAMUSCULAR | Status: AC
  Filled 2024-05-26: qty 5

## 2024-05-26 MED ORDER — OXYCODONE HCL 5 MG/5ML PO SOLN: 5.0000 mg | Freq: Once | ORAL | Status: DC | PRN

## 2024-05-26 MED ORDER — SODIUM CHLORIDE 0.9 % IV SOLN: INTRAVENOUS | Status: DC

## 2024-05-26 MED ORDER — DEXAMETHASONE SOD PHOSPHATE PF 10 MG/ML IJ SOLN
INTRAMUSCULAR | Status: AC
  Filled 2024-05-26: qty 1

## 2024-05-26 MED ORDER — FENTANYL CITRATE (PF) 100 MCG/2ML IJ SOLN
25.0000 ug | INTRAMUSCULAR | Status: DC | PRN
  Administered 2024-05-26 (×2): 25 ug via INTRAVENOUS

## 2024-05-26 MED ORDER — LIDOCAINE 2% (20 MG/ML) 5 ML SYRINGE
INTRAMUSCULAR | Status: DC | PRN
  Administered 2024-05-26: 50 mg via INTRAVENOUS

## 2024-05-26 MED ORDER — PROPOFOL 10 MG/ML IV BOLUS
INTRAVENOUS | Status: AC
  Filled 2024-05-26: qty 20

## 2024-05-26 NOTE — Progress Notes (Signed)
 "  PROGRESS NOTE    Terry [name]  FMW:[id number] DOB: 1936/05/11 DOA: 05/20/2024 PCP: Terry Anes, MD   Brief Narrative: Terry [name] is a 89 y.o. male with a history of mitral valve replacement with mechanical valve, primary hypertension, hyperlipidemia, BPH.  Patient presented secondary to worsening leg swelling related to leg trauma and complicated by Coumadin  use, found to have a hematoma; compartment syndrome ruled out. Orthopedic surgery with recommendation for I&D.   Assessment and Plan:  Left lower extremity hematoma Secondary to injury and complicated by Coumadin  use. Significant symptoms. Orthopedic surgery consulted and plan I&D for 1/14.  Primary hypertension - Continue Toprol  XL  History of mitral valve replacement Patient is on Coumadin  as an outpatient. Goal INR of 2.5 to 3.5; no evidence of supratherapeutic INR on admission, but was supratherapeutic around time of his injury. Coumadin  held on admission and started on Heparin  IV bridge. - Continue IV bridge - Restart Coumadin  once no further surgical management planned  Hyperlipidemia Prior to arrival medication(s) include Crestor  which was held secondary to elevated AST/ALT; AST/ALT trending down.  Acute blood loss anemia Chronic normocytic anemia Baseline hemoglobin of about 9-11 but decreased to 7.4 secondary to hematoma. Patient received 1 unit of PRBC. - Trend CBC as needed   DVT prophylaxis: Heparin  IV Code Status:   Code Status: Full Code Family Communication: None at bedside Disposition Plan: Discharge pending ongoing orthopedic surgery recommendations, stable hemoglobin, transition back to Coumadin    Consultants:  Orthopedic surgery  Procedures:  None  Antimicrobials: None    Subjective: Patient reports no specific concerns from overnight. Awaiting his surgery today.  Objective: BP 134/63 (BP Location: Left Arm)   Pulse 85   Temp (!) 97.5 F (36.4 C)   Resp 18   Ht 5' 11  (1.803 m)   Wt 85.1 kg   SpO2 94%   BMI 26.17 kg/m   Examination:  General exam: Appears calm and comfortable. Respiratory system: Clear to auscultation. Respiratory effort normal. Cardiovascular system: S1 & S2 heard, RRR. No murmur. Mechanical click Gastrointestinal system: Abdomen is nondistended, soft and nontender. Normal bowel sounds heard. Central nervous system: Alert and oriented. No focal neurological deficits. Musculoskeletal: Left leg wrapped in gauze dressing.  Psychiatry: Judgement and insight appear normal. Mood & affect appropriate.    Data Reviewed: I have personally reviewed following labs and imaging studies  CBC Lab Results  Component Value Date   WBC 12.1 (H) 05/26/2024   RBC 2.72 (L) 05/26/2024   HGB 8.5 (L) 05/26/2024   HCT 25.7 (L) 05/26/2024   MCV 94.5 05/26/2024   MCH 31.3 05/26/2024   PLT 365 05/26/2024   MCHC 33.1 05/26/2024   RDW 15.5 05/26/2024   LYMPHSABS 0.5 (L) 05/20/2024   MONOABS 1.1 (H) 05/20/2024   EOSABS 0.2 05/20/2024   BASOSABS 0.0 05/20/2024     Last metabolic panel Lab Results  Component Value Date   NA 134 (L) 05/26/2024   K 4.0 05/26/2024   CL 102 05/26/2024   CO2 24 05/26/2024   BUN 17 05/26/2024   CREATININE 0.80 05/26/2024   GLUCOSE 99 05/26/2024   GFRNONAA >60 05/26/2024   CALCIUM  8.2 (L) 05/26/2024   PROT 6.0 (L) 05/26/2024   ALBUMIN 2.9 (L) 05/26/2024   BILITOT 0.5 05/26/2024   ALKPHOS 68 05/26/2024   AST 56 (H) 05/26/2024   ALT 52 (H) 05/26/2024   ANIONGAP 8 05/26/2024    GFR: Estimated Creatinine Clearance: 70.6 mL/min (by C-G formula based  on SCr of 0.8 mg/dL).  Recent Results (from the past 240 hours)  Blood culture (routine x 2)     Status: None   Collection Time: 05/20/24  3:55 PM   Specimen: BLOOD  Result Value Ref Range Status   Specimen Description BLOOD RIGHT ANTECUBITAL  Final   Special Requests   Final    BOTTLES DRAWN AEROBIC AND ANAEROBIC Blood Culture adequate volume   Culture    Final    NO GROWTH 5 DAYS Performed at [facility], [address] [place], [place] [address]    Report Status 05/25/2024 FINAL  Final  Blood culture (routine x 2)     Status: None   Collection Time: 05/20/24  6:19 PM   Specimen: BLOOD  Result Value Ref Range Status   Specimen Description BLOOD SITE NOT SPECIFIED  Final   Special Requests   Final    BOTTLES DRAWN AEROBIC AND ANAEROBIC Blood Culture adequate volume   Culture   Final    NO GROWTH 5 DAYS Performed at [facility], [address], [place], [place] [address]    Report Status 05/25/2024 FINAL  Final  Aerobic Culture w Gram Stain (superficial specimen)     Status: None   Collection Time: 05/20/24  6:19 PM   Specimen: Wound  Result Value Ref Range Status   Specimen Description WOUND  Final   Special Requests NONE  Final   Gram Stain NO WBC SEEN NO ORGANISMS SEEN   Final   Culture   Final    NO GROWTH 2 DAYS Performed at [facility], [address], [place], [place] [address]    Report Status 05/25/2024 FINAL  Final  MRSA Next Gen by PCR, Nasal     Status: None   Collection Time: 05/24/24 11:01 AM   Specimen: Nasal Mucosa; Nasal Swab  Result Value Ref Range Status   MRSA by PCR Next Gen NOT DETECTED NOT DETECTED Final    Comment: (NOTE) The GeneXpert MRSA Assay (FDA approved for NASAL specimens only), is one component of a comprehensive MRSA colonization surveillance program. It is not intended to diagnose MRSA infection nor to guide or monitor treatment for MRSA infections. Test performance is not FDA approved in patients less than 23 years old. Performed at [facility], [address] [place], [place] [address]       Radiology Studies: No results found.    LOS: 6 days    Elgin Lam, MD [facility] 05/26/2024, 9:39 AM   If 7PM-7AM, please contact night-coverage [email or web address]  "

## 2024-05-26 NOTE — Interval H&P Note (Signed)
 History and Physical Interval Note:  05/26/2024 6:38 AM  Terry [name]  has presented today for surgery, with the diagnosis of Hematoma Left Leg.  The various methods of treatment have been discussed with the patient and family. After consideration of risks, benefits and other options for treatment, the patient has consented to  Procedures with comments: INCISION AND DRAINAGE OF DEEP ABSCESS, CALF (Left) - LEFT LEG EXCISIONAL DEBRIDEMENT as a surgical intervention.  The patient's history has been reviewed, patient examined, no change in status, stable for surgery.  I have reviewed the patient's chart and labs.  Questions were answered to the patient's satisfaction.     Aalaysia Liggins V Mikhala Kenan

## 2024-05-26 NOTE — Progress Notes (Signed)
 PHARMACY - ANTICOAGULATION CONSULT NOTE  Pharmacy Consult for heparin  Indication: mech MVR (warfarin PTA on hold)  Allergies[1]  Patient Measurements: Height: 5' 11 (180.3 cm) Weight: 85 kg (187 lb 6.3 oz) IBW/kg (Calculated) : 75.3 HEPARIN  DW (KG): 85  Vital Signs: Temp: 98.6 F (37 C) (01/14 1522) Temp Source: Oral (01/14 1522) BP: 130/61 (01/14 1522) Pulse Rate: 109 (01/14 1522)  Labs: Recent Labs    05/24/24 0554 05/24/24 1938 05/25/24 0550 05/25/24 2050 05/26/24 0432  HGB 8.3*  --  7.4* 9.1* 8.5*  HCT 24.7*  --  22.1* 27.4* 25.7*  PLT 294  --  309  --  365  HEPARINUNFRC 0.24* 0.39 0.40  --  0.38  CREATININE 0.83  --  0.80  --  0.80  CKTOTAL 1,772*  --   --   --   --     Estimated Creatinine Clearance: 70.6 mL/min (by C-G formula based on SCr of 0.8 mg/dL).  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 Warfarin reversed with vitamin K po 2.5mg  x1. Pharmacy consulted for heparin  when INR < 2.5, started 1/9.    Heparin  level therapeutic x2 on 1350 units/hr. No overt bleeding noted, Hgb down 8s, platelets are normal, left leg hematoma without evidence of compartment syndrome. Per Ortho, planning I&D 1/14 and 1/16.  1/14 underwent debridement of L leg hematoma - ok to restart heparin  in AM per Ortho MD.    Goal of Therapy:  Heparin  level 0.3-0.5 units/ml INR 2.5- 3.5 Monitor platelets by anticoagulation protocol: Yes   Plan:  Resume heparin  at previously therapeutic rate of 1350units/hr on 05/27/24 @ 0700  Check heparin  level in 8 hours after that and daily while on heparin  Continue to monitor H&H and platelets F/u restart warfarin when ok with Ortho and any further procedures  Sharyne Glatter, PharmD, BCCCP Critical Care Clinical Pharmacist 05/26/2024 4:23 PM    [1]  Allergies Allergen Reactions   Alendronate Other (See Comments)    GERD   Aspirin     too much bruising when  added to Coumadin    Cipro [Ciprofloxacin Hcl] Other (See Comments)    Heartburn    Sulfa Drugs Cross Reactors Hives

## 2024-05-26 NOTE — Anesthesia Procedure Notes (Signed)
 Procedure Name: LMA Insertion Date/Time: 05/26/2024 12:30 PM  Performed by: Deeann Eva BROCKS, CRNAPre-anesthesia Checklist: Patient identified, Emergency Drugs available, Suction available and Patient being monitored Patient Re-evaluated:Patient Re-evaluated prior to induction Oxygen Delivery Method: Circle System Utilized Preoxygenation: Pre-oxygenation with 100% oxygen Induction Type: IV induction Ventilation: Mask ventilation without difficulty LMA: LMA with gastric port inserted LMA Size: 4.0 Number of attempts: 1 Airway Equipment and Method: Bite block Placement Confirmation: positive ETCO2 Tube secured with: Tape Dental Injury: Teeth and Oropharynx as per pre-operative assessment

## 2024-05-26 NOTE — Plan of Care (Signed)
   Problem: Education: Goal: Knowledge of General Education information will improve Description: Including pain rating scale, medication(s)/side effects and non-pharmacologic comfort measures Outcome: Progressing   Problem: Activity: Goal: Risk for activity intolerance will decrease Outcome: Progressing   Problem: Nutrition: Goal: Adequate nutrition will be maintained Outcome: Progressing   Problem: Coping: Goal: Level of anxiety will decrease Outcome: Progressing

## 2024-05-26 NOTE — Anesthesia Postprocedure Evaluation (Signed)
"   Anesthesia Post Note  Patient: Terry [name]  Procedure(s) Performed: INCISION AND DRAINAGE OF  LEFT LEG CALF EXCISIONAL DEBRIDEMENT (Left: Leg Lower)     Patient location during evaluation: PACU Anesthesia Type: General Level of consciousness: awake and alert Pain management: pain level controlled Vital Signs Assessment: post-procedure vital signs reviewed and stable Respiratory status: spontaneous breathing, nonlabored ventilation, respiratory function stable and patient connected to nasal cannula oxygen Cardiovascular status: blood pressure returned to baseline and stable Postop Assessment: no apparent nausea or vomiting Anesthetic complications: no   No notable events documented.  Last Vitals:  Vitals:   05/26/24 1522 05/26/24 1801  BP: 130/61 130/65  Pulse: (!) 109 (!) 103  Resp: 14 16  Temp: 37 C 36.6 C  SpO2: 98% 99%    Last Pain:  Vitals:   05/26/24 1801  TempSrc: Oral  PainSc:    Pain Goal: Patients Stated Pain Goal: 0 (05/23/24 0156)                 Dylana Shaw      "

## 2024-05-26 NOTE — Progress Notes (Signed)
 First attempt to obtain report from [id number] prior to surgery.

## 2024-05-26 NOTE — TOC Initial Note (Signed)
 Transition of Care (TOC) - Initial/Assessment Note   Spoke to patient at bedside. From home alone. Patient lives in a tiny home on his niece's property. Niece's home is very close.   In 2023 patient went to [facility] for short term rehab and then had [facility] for home health PT/OT .   Patient has a walker at home but prior to admission was not using walker. Also, prior to admission patient walked  two miles a day with two walking sticks.   Patient for surgery today and Friday . Inpatient Care Management Team will continue to follow for disposition needs.   Patient did mention he would like a 3 in 1 for home to use as a commode seat riser   Will follow for PT/OT post op evaluations     Patient Details  Name: Terry [name] MRN: [id number] Date of Birth: 01/26/1936  Transition of Care [facility]) CM/SW Contact:    Stephane Powell Jansky, RN Phone Number: 05/26/2024, 8:50 AM  Clinical Narrative:                   Expected Discharge Plan:  (await post op PT/OT evals) Barriers to Discharge: Continued Medical Work up   Patient Goals and CMS Choice Patient states their goals for this hospitalization and ongoing recovery are:: to return to home CMS Medicare.gov Compare Post Acute Care list provided to::  (await recommendations)        Expected Discharge Plan and Services   Discharge Planning Services: CM Consult Post Acute Care Choice: NA Living arrangements for the past 2 months: Single Family Home                 DME Arranged:  (Await recommendations)         HH Arranged: NA          Prior Living Arrangements/Services Living arrangements for the past 2 months: Single Family Home Lives with:: Self Patient language and need for interpreter reviewed:: Yes Do you feel safe going back to the place where you live?: Yes      Need for Family Participation in Patient Care: Yes (Comment) Care giver support system in place?: Yes (comment) Current home services: DME Criminal  Activity/Legal Involvement Pertinent to Current Situation/Hospitalization: No - Comment as needed  Activities of Daily Living   ADL Screening (condition at time of admission) Independently performs ADLs?: Yes (appropriate for developmental age) Is the patient deaf or have difficulty hearing?: Yes Does the patient have difficulty seeing, even when wearing glasses/contacts?: No Does the patient have difficulty concentrating, remembering, or making decisions?: No  Permission Sought/Granted   Permission granted to share information with : No              Emotional Assessment Appearance:: Appears stated age Attitude/Demeanor/Rapport: Engaged Affect (typically observed): Appropriate Orientation: : Oriented to Self, Oriented to Place, Oriented to  Time, Oriented to Situation Alcohol / Substance Use: Not Applicable Psych Involvement: No (comment)  Admission diagnosis:  Hematoma [T14.8XXA] Patient Active Problem List   Diagnosis Date Noted   Hematoma of left lower extremity 05/24/2024   Acute blood loss anemia 12/24/2021   Essential hypertension 12/24/2021   Hyperlipidemia 12/24/2021   Fall 12/08/2021   Multiple abrasions 12/08/2021   Hematoma 12/08/2021   Syncope 12/07/2021   Encounter for therapeutic drug monitoring 05/09/2017   Chronic anticoagulation 03/01/2016   S/P mitral valve replacement 11/09/2010   H/O mitral valve replacement with mechanical valve 08/03/2010   PCP:  Shayne Anes,  MD Pharmacy:   [id number] [place], [place] - [address] AT [facility] [address] [place] [address] Phone: [phone] Fax: [phone]     Social Drivers of Health (SDOH) Social History: SDOH Screenings   Food Insecurity: No Food Insecurity (05/20/2024)  Housing: Low Risk (05/20/2024)  Transportation Needs: No Transportation Needs (05/20/2024)  Utilities: Not At Risk (05/20/2024)  Social Connections: Socially Isolated (05/20/2024)  Tobacco Use:  Medium Risk (05/20/2024)   SDOH Interventions: Social Connections Interventions: Intervention Not Indicated, Inpatient TOC   Readmission Risk Interventions     No data to display

## 2024-05-26 NOTE — Plan of Care (Signed)

## 2024-05-26 NOTE — Progress Notes (Addendum)
 PHARMACY - ANTICOAGULATION CONSULT NOTE  Pharmacy Consult for heparin  Indication: mech MVR (warfarin PTA on hold)  Allergies[1]  Patient Measurements: Height: 5' 11 (180.3 cm) Weight: 85.1 kg (187 lb 9.8 oz) IBW/kg (Calculated) : 75.3 HEPARIN  DW (KG): 85.1  Vital Signs: Temp: 97.5 F (36.4 C) (01/14 0521) BP: 134/63 (01/14 0521) Pulse Rate: 85 (01/14 0521)  Labs: Recent Labs    05/24/24 0554 05/24/24 1938 05/25/24 0550 05/25/24 2050 05/26/24 0432  HGB 8.3*  --  7.4* 9.1* 8.5*  HCT 24.7*  --  22.1* 27.4* 25.7*  PLT 294  --  309  --  365  HEPARINUNFRC 0.24* 0.39 0.40  --  0.38  CREATININE 0.83  --  0.80  --  0.80  CKTOTAL 1,772*  --   --   --   --     Estimated Creatinine Clearance: 70.6 mL/min (by C-G formula based on SCr of 0.8 mg/dL).  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 Warfarin reversed with vitamin K po 2.5mg  x1. Pharmacy consulted for heparin  when INR < 2.5, started 1/9.    Heparin  level therapeutic x2 on 1350 units/hr. No overt bleeding noted, Hgb down 8s, platelets are normal, left leg hematoma without evidence of compartment syndrome. Per Ortho, planning I&D 1/14 and 1/16, f/u need to hold heparin  prior to procedures.   Confirmed with RN, heparin  infusion charted as stopped @0500  and restarted @0600 , no current issues with heparin  infusion and no further interruptions noted.   Goal of Therapy:  Heparin  level 0.3-0.5 units/ml INR 2.5- 3.5 Monitor platelets by anticoagulation protocol: Yes   Plan:  Heparin  infusion stopped at 10:50 for OR F/u resuming heparin  infusion post-OR procedure Check heparin  level in 8 hours and daily while on heparin  Continue to monitor H&H and platelets F/u restart warfarin when ok with Ortho  Thank you for allowing pharmacy to be a part of this patients care.  Shelba Collier, PharmD, BCPS Clinical Pharmacist    [1]   Allergies Allergen Reactions   Alendronate Other (See Comments)    GERD   Aspirin     too much bruising when added to Coumadin    Cipro [Ciprofloxacin Hcl] Other (See Comments)    Heartburn    Sulfa Drugs Cross Reactors Hives

## 2024-05-26 NOTE — Op Note (Signed)
 05/26/2024  1:18 PM  PATIENT:  Terry [name]    PRE-OPERATIVE DIAGNOSIS:  Hematoma Left Leg, compartment syndrome anterior and lateral compartment left leg  POST-OPERATIVE DIAGNOSIS:  Same  PROCEDURE: Excisional debridement of the hematoma left leg. Compartment release of the anterior lateral compartment left leg with fasciectomy. Tissue sent for cultures. Application of Kerecis micro graft 95 cm to cover wound surface area greater than 200 cm. Application vancomycin  powder 1 g and tobramycin  powder 1.2 g. Application of the cleanse choice wound VAC sponge in the peel and place wound VAC sponge x 2.  SURGEON:  Jerona LULLA Sage, MD  PHYSICIAN ASSISTANT:None ANESTHESIA:   General  PREOPERATIVE INDICATIONS:  Terry [name] is a  89 y.o. male with a diagnosis of Hematoma Left Leg who failed conservative measures and elected for surgical management.    The risks benefits and alternatives were discussed with the patient preoperatively including but not limited to the risks of infection, bleeding, nerve injury, cardiopulmonary complications, the need for revision surgery, among others, and the patient was willing to proceed.  OPERATIVE IMPLANTS:   Implant Name Type Inv. Item Serial No. Manufacturer Lot No. LRB No. Used Action  GRAFT SKIN WND SURGICLOSE M95 - [id number] Tissue GRAFT SKIN WND SURGICLOSE M95  [employer] [id number] Left 1 Implanted    @ENCIMAGES @  OPERATIVE FINDINGS: Patient had a large hematoma this was evacuated.  The skin had extensive ischemic changes.  Tissue was sent for cultures.  Compartments were released the muscle was viable with good color and contractility.  OPERATIVE PROCEDURE: Patient was brought the operating room and underwent a general anesthetic.  After adequate levels anesthesia were obtained patient's left lower extremity was prepped using DuraPrep draped into a sterile field a timeout was called.  A large extensile incision was made over the  anterior compartment.  There was a large hematoma that extended from the posterior calf laterally and anteriorly.  This was evacuated a Cobb elevator was used for further debridement.  The posterior and lateral skin was extremely thin and atrophic.  This was left in place.  The compartments were tight.  A fasciotomy was performed for the anterior and lateral compartment.  There was inflammation of the fascia and the fascia was excised and resected.  The muscle and the anterior and lateral compartment had good color and contractility.  The wound was irrigated with Vashe.  The 95 cm of Kerecis micro graft was mixed with 1 g vancomycin  powder and 1.2 g tobramycin  powder.  This was used to reinforce the soft tissue.  The cleanse choice wound VAC sponge was placed deep in the wound this was covered with 2 large peel in place wound VAC sponges that were connected through a Y connector.  The wound VAC had a good suction fit patient was extubated taken the PACU in stable condition.   DISCHARGE PLANNING:  Antibiotic duration: Continue antibiotics adjust according to tissue cultures  Weightbearing: Weightbearing as tolerated  Pain medication: Opioid pathway  Dressing care/ Wound VAC: Continue wound VAC  Ambulatory devices: Walker   Plan for return to the operating room on Friday.  Follow-up: In the office 1 week post operative.

## 2024-05-26 NOTE — Transfer of Care (Signed)
 Immediate Anesthesia Transfer of Care Note  Patient: Terry [name]  Procedure(s) Performed: INCISION AND DRAINAGE OF  LEFT LEG CALF EXCISIONAL DEBRIDEMENT (Left: Leg Lower)  Patient Location: PACU  Anesthesia Type:General  Level of Consciousness: pateint uncooperative and confused  Airway & Oxygen Therapy: Patient Spontanous Breathing and Patient connected to face mask oxygen  Post-op Assessment: Report given to RN and Post -op Vital signs reviewed and stable  Post vital signs: Reviewed and stable  Last Vitals:  Vitals Value Taken Time  BP 191/68 05/26/24 13:16  Temp    Pulse 104 05/26/24 13:20  Resp 17 05/26/24 13:20  SpO2 100 % 05/26/24 13:20  Vitals shown include unfiled device data.  Last Pain:  Vitals:   05/26/24 1050  TempSrc: Oral  PainSc:       Patients Stated Pain Goal: 0 (05/23/24 0156)  Complications: No notable events documented.

## 2024-05-26 NOTE — Anesthesia Preprocedure Evaluation (Addendum)
"                                    Anesthesia Evaluation  Patient identified by MRN, date of birth, ID band Patient awake    Reviewed: Allergy & Precautions, H&P , NPO status , Patient's Chart, lab work & pertinent test results  Airway Mallampati: III  TM Distance: <3 FB Neck ROM: Limited    Dental no notable dental hx. (+) Poor Dentition, Chipped, Missing, Dental Advisory Given   Pulmonary neg pulmonary ROS, former smoker   Pulmonary exam normal breath sounds clear to auscultation       Cardiovascular Exercise Tolerance: Good hypertension, Pt. on home beta blockers negative cardio ROS Normal cardiovascular exam Rhythm:Regular Rate:Normal  Echo 23   1. Study is not well visualized. Left ventricular ejection fraction, by  estimation, is 60 to 65%. The left ventricle has normal function. The left  ventricle has no regional wall motion abnormalities. Left ventricular  diastolic parameters are consistent  with Grade I diastolic dysfunction (impaired relaxation).   2. Right ventricular systolic function is normal. The right ventricular  size is not well visualized. Tricuspid regurgitation signal is inadequate  for assessing PA pressure.   3. S/p mechanical MVR 2003. MV mean gradient 4 mmHg at HR 92 bpm. No  significant stenosis; normal prosthesis. No evidence of mitral valve  regurgitation.   4. The aortic valve was not well visualized. Aortic valve regurgitation  is not visualized. No aortic stenosis is present.   5. The inferior vena cava is normal in size with greater than 50%  respiratory variability, suggesting right atrial pressure of 3 mmHg.     Neuro/Psych negative neurological ROS  negative psych ROS   GI/Hepatic negative GI ROS, Neg liver ROS,,,  Endo/Other  negative endocrine ROS    Renal/GU negative Renal ROS  negative genitourinary   Musculoskeletal negative musculoskeletal ROS (+)    Abdominal   Peds negative pediatric ROS (+)   Hematology negative hematology ROS (+) Blood dyscrasia, anemia   Anesthesia Other Findings   Reproductive/Obstetrics negative OB ROS                              Anesthesia Physical Anesthesia Plan  ASA: 3  Anesthesia Plan: General   Post-op Pain Management: Minimal or no pain anticipated   Induction:   PONV Risk Score and Plan: 2 and Ondansetron , Dexamethasone  and Treatment may vary due to age or medical condition  Airway Management Planned: LMA  Additional Equipment: None  Intra-op Plan:   Post-operative Plan:   Informed Consent: I have reviewed the patients History and Physical, chart, labs and discussed the procedure including the risks, benefits and alternatives for the proposed anesthesia with the patient or authorized representative who has indicated his/her understanding and acceptance.       Plan Discussed with: Anesthesiologist and CRNA  Anesthesia Plan Comments:          Anesthesia Quick Evaluation  "

## 2024-05-27 ENCOUNTER — Encounter (HOSPITAL_COMMUNITY): Payer: Self-pay | Admitting: Orthopedic Surgery

## 2024-05-27 DIAGNOSIS — T79A22A Traumatic compartment syndrome of left lower extremity, initial encounter: Secondary | ICD-10-CM | POA: Diagnosis not present

## 2024-05-27 DIAGNOSIS — T148XXA Other injury of unspecified body region, initial encounter: Secondary | ICD-10-CM | POA: Diagnosis not present

## 2024-05-27 LAB — CBC
HCT: 25 % — ABNORMAL LOW (ref 39.0–52.0)
Hemoglobin: 8.1 g/dL — ABNORMAL LOW (ref 13.0–17.0)
MCH: 31.3 pg (ref 26.0–34.0)
MCHC: 32.4 g/dL (ref 30.0–36.0)
MCV: 96.5 fL (ref 80.0–100.0)
Platelets: 398 K/uL (ref 150–400)
RBC: 2.59 MIL/uL — ABNORMAL LOW (ref 4.22–5.81)
RDW: 15 % (ref 11.5–15.5)
WBC: 15 K/uL — ABNORMAL HIGH (ref 4.0–10.5)
nRBC: 0 % (ref 0.0–0.2)

## 2024-05-27 LAB — PREPARE RBC (CROSSMATCH)

## 2024-05-27 LAB — HEPARIN LEVEL (UNFRACTIONATED): Heparin Unfractionated: 0.38 [IU]/mL (ref 0.30–0.70)

## 2024-05-27 LAB — MRSA NEXT GEN BY PCR, NASAL: MRSA by PCR Next Gen: NOT DETECTED

## 2024-05-27 MED ORDER — SODIUM CHLORIDE 0.9% IV SOLUTION: Freq: Once | INTRAVENOUS | Status: AC

## 2024-05-27 MED ORDER — CHLORHEXIDINE GLUCONATE 4 % EX SOLN
60.0000 mL | Freq: Once | CUTANEOUS | Status: AC
  Administered 2024-05-27: 4 via TOPICAL
  Filled 2024-05-27: qty 60

## 2024-05-27 NOTE — Evaluation (Signed)
 Physical Therapy Evaluation Patient Details Name: Terry [name] MRN: [id number] DOB: 06-07-1935 Today's Date: 05/27/2024  History of Present Illness  89 y.o. male presented 05/20/24 for worsening leg swelling related to leg trauma and complicated by Coumadin  use. Found to have a hematoma and evidence of compartment syndrome. Pt underwent excisional debridement, compartment release of the anterior and lateral compartment of LLE with fasciectomy, and application of wound vac 1/14. Pending repeat I&D and tissue graft tomorrow (1/16). PMHx: mitral valve replacement with mechanical valve, primary HTN, HLD, and BPH.   Clinical Impression  Pt admitted with above diagnosis. PTA, pt was independent with functional mobility, ADLs/IADLs, and driving. He reports an active lifestyle including daily ~2 mile morning walks using trekking poles as well as volunteering at [employer] 2x/week. He lives alone in a tiny house on his niece's property. Pt reports she can provide 24/7 support. Pt currently with functional limitations due to the deficits listed below (see PT Problem List). He required minA for bed mobility, minA for sit<>stand using RW, and CGA for gait using RW. Pt is currently limited by pain, LLE impairments, decreased balance, and decreased activity tolerance. Pt will benefit from acute skilled PT to increase his independence and safety with mobility to allow discharge. Recommend HHPT to increase strength, improve balance, decrease fall risk, advance activity tolerance, and optimize safety within the home environment.     If plan is discharge home, recommend the following: A little help with walking and/or transfers;A little help with bathing/dressing/bathroom;Assistance with cooking/housework;Assist for transportation;Help with stairs or ramp for entrance   Can travel by private vehicle        Equipment Recommendations BSC/3in1  Recommendations for Other Services       Functional Status  Assessment Patient has had a recent decline in their functional status and demonstrates the ability to make significant improvements in function in a reasonable and predictable amount of time.     Precautions / Restrictions Precautions Precautions: Fall Recall of Precautions/Restrictions: Intact Precaution/Restrictions Comments: Wound Vac LLE with double lines Restrictions Weight Bearing Restrictions Per Provider Order: Yes LLE Weight Bearing Per Provider Order: Weight bearing as tolerated      Mobility  Bed Mobility Overal bed mobility: Needs Assistance Bed Mobility: Supine to Sit     Supine to sit: HOB elevated, Used rails, Min assist     General bed mobility comments: Pt sat up on R side of bed with increased time. Assist to manage LLE. Pt used bed rails to elevate trunk and scoot hips fwd.    Transfers Overall transfer level: Needs assistance Equipment used: Rolling walker (2 wheels) Transfers: Sit to/from Stand Sit to Stand: From elevated surface, Min assist           General transfer comment: Pt stood from raised bed height. Cued proper hand placement using RW. Powered up with minA. Pt demonstrated a heavy weight shift to RLE, initially maintaining LLE NWB. He slowly allowed L foot contact and weight acceptance. Fair eccentric control.    Ambulation/Gait Ambulation/Gait assistance: Contact guard assist Gait Distance (Feet): 10 Feet Assistive device: Rolling walker (2 wheels) Gait Pattern/deviations: Step-to pattern, Decreased stance time - left, Decreased weight shift to left, Antalgic Gait velocity: decr     General Gait Details: Pt ambulated with short slow laborious steps. He self-limited weight acceptance on LLE d/t pain. Moderate antalgic gait pattern with heavy reliance on BUE support on RW. Assist to manage lines/leads. Pt navigated around FOB to recliner chair. No LOB.  Stairs            Wheelchair Mobility     Tilt Bed    Modified Rankin  (Stroke Patients Only)       Balance Overall balance assessment: Needs assistance Sitting-balance support: No upper extremity supported, Feet supported Sitting balance-Leahy Scale: Fair Sitting balance - Comments: Sat EOB   Standing balance support: Bilateral upper extremity supported, During functional activity, Reliant on assistive device for balance Standing balance-Leahy Scale: Poor Standing balance comment: Pt dependent on RW. He maintained static stance for increased time as RN addressed pericare.                             Pertinent Vitals/Pain Pain Assessment Pain Assessment: 0-10 Pain Score: 6  Pain Location: LLE Pain Descriptors / Indicators: Grimacing, Discomfort, Aching Pain Intervention(s): Monitored during session, Limited activity within patient's tolerance, Repositioned, Patient requesting pain meds-RN notified    Home Living Family/patient expects to be discharged to:: Private residence Living Arrangements: Alone Available Help at Discharge: Family;Available 24 hours/day (Niece 24/7) Type of Home: House (Tiny Home on niece's property) Home Access: Level entry;Stairs to enter Entrance Stairs-Rails: None Entrance Stairs-Number of Steps: 2 (back door)   Home Layout: One level Home Equipment: Agricultural Consultant (2 wheels);Other (comment);Grab bars - toilet (treking poles; urinal) Additional Comments: Niece lives very close by, house on the same property.    Prior Function Prior Level of Function : Independent/Modified Independent;Driving             Mobility Comments: Indep without AD. Uses treking poles on his morning walks across the side walks going ~2 miles. 1 fall leading to current admission. ADLs Comments: Indep with self-care. 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     Extremity/Trunk Assessment   Upper Extremity Assessment Upper Extremity Assessment: Defer to OT evaluation    Lower Extremity  Assessment Lower Extremity Assessment: LLE deficits/detail LLE Deficits / Details: Pt POD 1 s/p fasciotomy and excisional debridement on LLE. He has a wound vac with dressing along distal lower limb and two different lines. Decreased AROM d/t pain. Decreased strength, grossly 3-/5. LLE: Unable to fully assess due to pain    Cervical / Trunk Assessment Cervical / Trunk Assessment: Normal  Communication   Communication Communication: Impaired Factors Affecting Communication: Hearing impaired (wearing hearing aids)    Cognition Arousal: Alert Behavior During Therapy: WFL for tasks assessed/performed   PT - Cognitive impairments: No apparent impairments                       PT - Cognition Comments: Pt A,Ox4 Following commands: Intact       Cueing Cueing Techniques: Verbal cues     General Comments General comments (skin integrity, edema, etc.): Pt found soiled in bed. RN entered to assist in linen change and pericare. Pt on 2L O2 via [place], SpO2 99%. Weaned to RA and SpO2 96-98% with activity.    Exercises     Assessment/Plan    PT Assessment Patient needs continued PT services  PT Problem List Decreased strength;Decreased activity tolerance;Decreased balance;Decreased mobility;Decreased knowledge of use of DME;Decreased safety awareness;Decreased skin integrity;Pain       PT Treatment Interventions DME instruction;Gait training;Stair training;Functional mobility training;Therapeutic activities;Therapeutic exercise;Balance training;Patient/family education    PT Goals (Current goals can be found in the Care Plan section)  Acute Rehab PT Goals Patient Stated Goal:  Return Home and regain independence PT Goal Formulation: With patient Time For Goal Achievement: 06/10/24 Potential to Achieve Goals: Good    Frequency Min 2X/week     Co-evaluation               AM-PAC PT 6 Clicks Mobility  Outcome Measure Help needed turning from your back to your side  while in a flat bed without using bedrails?: A Little Help needed moving from lying on your back to sitting on the side of a flat bed without using bedrails?: A Little Help needed moving to and from a bed to a chair (including a wheelchair)?: A Little Help needed standing up from a chair using your arms (e.g., wheelchair or bedside chair)?: A Little Help needed to walk in hospital room?: A Little Help needed climbing 3-5 steps with a railing? : A Lot 6 Click Score: 17    End of Session Equipment Utilized During Treatment: Gait belt Activity Tolerance: Patient tolerated treatment well Patient left: in chair;with call bell/phone within reach;with chair alarm set Nurse Communication: Mobility status;Patient requests pain meds PT Visit Diagnosis: Difficulty in walking, not elsewhere classified (R26.2);Other abnormalities of gait and mobility (R26.89);Pain Pain - Right/Left: Left Pain - part of body: Leg    Time: 8461-8393 PT Time Calculation (min) (ACUTE ONLY): 28 min   Charges:   PT Evaluation $PT Eval Moderate Complexity: 1 Mod PT Treatments $Therapeutic Activity: 8-22 mins PT General Charges $$ ACUTE PT VISIT: 1 Visit         Randall SAUNDERS, PT, DPT Acute Rehabilitation Services Office: [phone] Secure Chat Preferred  Delon CHRISTELLA Callander 05/27/2024, 4:48 PM

## 2024-05-27 NOTE — Progress Notes (Signed)
" ° ° °  PROCEDURAL EXPEDITER PROGRESS NOTE  Patient Name: Terry [name]  DOB:12/24/1935 Date of Admission: 05/20/2024  Date of Assessment:05/27/24   -------------------------------------------------------------------------------------------------------------------   Brief clinical summary: Pt to OR tomorrow for excisional debridment of the left leg   Orders in place:  Yes   Labs, test, and orders reviewed: Y  Requires surgical clearance:  No  Barriers noted: N/A   -------------------------------------------------------------------------------------------------------------------  [facility], [place], [place] Please contact us  directly via secure chat (search for [facility]) or by calling us  at [phone] [facility]).  "

## 2024-05-27 NOTE — Plan of Care (Signed)

## 2024-05-27 NOTE — Progress Notes (Signed)
 "  PROGRESS NOTE    Terry [name]  FMW:[id number] DOB: 02/08/1936 DOA: 05/20/2024 PCP: Shayne Anes, MD   Brief Narrative: Terry [name] is a 89 y.o. male with a history of mitral valve replacement with mechanical valve, primary hypertension, hyperlipidemia, BPH.  Patient presented secondary to worsening leg swelling related to leg trauma and complicated by Coumadin  use, found to have a hematoma and evidence of compartment syndrome. Orthopedic surgery with recommendation for I&D. Initial debridement on 1/14 with application of a wound vac.   Assessment and Plan:  Left lower extremity hematoma Secondary to injury and complicated by Coumadin  use. Significant symptoms. Empiric antibiotics started. Orthopedic surgery consulted and performed debridement for the hematoma and compartment syndrome. Per orthopedic surgery, patient is weight bearing as tolerated with a walker. - Orthopedic surgery recommendations: plan for repeat debridement on 1/16 - PT/OT evaluation - Continue Ceftriaxone  and linezolid   Primary hypertension - Continue Toprol  XL  History of mitral valve replacement Patient is on Coumadin  as an outpatient. Goal INR of 2.5 to 3.5; no evidence of supratherapeutic INR on admission, but was supratherapeutic around time of his injury. Coumadin  held on admission and started on Heparin  IV bridge. - Continue Heparin  IV bridge - Restart Coumadin  once no further surgical management planned  Hyperlipidemia Prior to arrival medication(s) include Crestor  which was held secondary to elevated AST/ALT; AST/ALT trending down.  Acute blood loss anemia Chronic normocytic anemia Baseline hemoglobin of about 9-11 but decreased to 7.4 secondary to hematoma. Patient received 1 unit of PRBC. - Trend CBC   DVT prophylaxis: Heparin  IV Code Status:   Code Status: Full Code Family Communication: None at bedside Disposition Plan: Discharge pending ongoing orthopedic surgery recommendations,  stable hemoglobin, transition back to Coumadin    Consultants:  Orthopedic surgery  Procedures:  Orthopedic surgery (1/14) Excisional debridement of the hematoma left leg. Compartment release of the anterior lateral compartment left leg with fasciectomy. Tissue sent for cultures. Application of Kerecis micro graft 95 cm to cover wound surface area greater than 200 cm. Application vancomycin  powder 1 g and tobramycin  powder 1.2 g. Application of the cleanse choice wound VAC sponge in the peel and place wound VAC sponge x 2.  Antimicrobials: Ceftriaxone     Subjective: Patient reports feeling much better today after his surgery yesterday. No concerns this morning.  Objective: BP (!) 132/51 (BP Location: Left Arm)   Pulse 98   Temp 98.2 F (36.8 C) (Oral)   Resp 18   Ht 5' 11 (1.803 m)   Wt 85 kg   SpO2 100%   BMI 26.14 kg/m   Examination:  General exam: Appears calm and comfortable. Respiratory system: Clear to auscultation. Respiratory effort normal. Cardiovascular system: S1 & S2 heard, RRR. No murmur. Gastrointestinal system: Abdomen is nondistended, soft and nontender. Normal bowel sounds heard. Central nervous system: Alert. Musculoskeletal: Left leg with wound vac applied.    Data Reviewed: I have personally reviewed following labs and imaging studies  CBC Lab Results  Component Value Date   WBC 15.0 (H) 05/27/2024   RBC 2.59 (L) 05/27/2024   HGB 8.1 (L) 05/27/2024   HCT 25.0 (L) 05/27/2024   MCV 96.5 05/27/2024   MCH 31.3 05/27/2024   PLT 398 05/27/2024   MCHC 32.4 05/27/2024   RDW 15.0 05/27/2024   LYMPHSABS 0.5 (L) 05/20/2024   MONOABS 1.1 (H) 05/20/2024   EOSABS 0.2 05/20/2024   BASOSABS 0.0 05/20/2024     Last metabolic panel Lab Results  Component Value  Date   NA 134 (L) 05/26/2024   K 4.0 05/26/2024   CL 102 05/26/2024   CO2 24 05/26/2024   BUN 17 05/26/2024   CREATININE 0.80 05/26/2024   GLUCOSE 99 05/26/2024   GFRNONAA >60  05/26/2024   CALCIUM  8.2 (L) 05/26/2024   PROT 6.0 (L) 05/26/2024   ALBUMIN 2.9 (L) 05/26/2024   BILITOT 0.5 05/26/2024   ALKPHOS 68 05/26/2024   AST 56 (H) 05/26/2024   ALT 52 (H) 05/26/2024   ANIONGAP 8 05/26/2024    GFR: Estimated Creatinine Clearance: 70.6 mL/min (by C-G formula based on SCr of 0.8 mg/dL).  Recent Results (from the past 240 hours)  Blood culture (routine x 2)     Status: None   Collection Time: 05/20/24  3:55 PM   Specimen: BLOOD  Result Value Ref Range Status   Specimen Description BLOOD RIGHT ANTECUBITAL  Final   Special Requests   Final    BOTTLES DRAWN AEROBIC AND ANAEROBIC Blood Culture adequate volume   Culture   Final    NO GROWTH 5 DAYS Performed at [facility], [address], [place], [place] [address]    Report Status 05/25/2024 FINAL  Final  Blood culture (routine x 2)     Status: None   Collection Time: 05/20/24  6:19 PM   Specimen: BLOOD  Result Value Ref Range Status   Specimen Description BLOOD SITE NOT SPECIFIED  Final   Special Requests   Final    BOTTLES DRAWN AEROBIC AND ANAEROBIC Blood Culture adequate volume   Culture   Final    NO GROWTH 5 DAYS Performed at [facility] Lab, [address] [place], [place] [address]    Report Status 05/25/2024 FINAL  Final  Aerobic Culture w Gram Stain (superficial specimen)     Status: None   Collection Time: 05/20/24  6:19 PM   Specimen: Wound  Result Value Ref Range Status   Specimen Description WOUND  Final   Special Requests NONE  Final   Gram Stain NO WBC SEEN NO ORGANISMS SEEN   Final   Culture   Final    NO GROWTH 2 DAYS Performed at [facility], [address], [place], [place] [address]    Report Status 05/25/2024 FINAL  Final  MRSA Next Gen by PCR, Nasal     Status: None   Collection Time: 05/24/24 11:01 AM   Specimen: Nasal Mucosa; Nasal Swab  Result Value Ref Range Status   MRSA by PCR Next Gen NOT DETECTED NOT DETECTED Final    Comment: (NOTE) The  GeneXpert MRSA Assay (FDA approved for NASAL specimens only), is one component of a comprehensive MRSA colonization surveillance program. It is not intended to diagnose MRSA infection nor to guide or monitor treatment for MRSA infections. Test performance is not FDA approved in patients less than 34 years old. Performed at [facility] Lab, [address] [place], [place] [address]       Radiology Studies: No results found.    LOS: 7 days    Elgin Lam, MD [facility] 05/27/2024, 9:04 AM   If 7PM-7AM, please contact night-coverage [email or web address]  "

## 2024-05-27 NOTE — Progress Notes (Signed)
 Patient ID: Terry [name], male   DOB: [birth year], 89 y.o.   MRN: [id number] Patient is status post extensive debridement for hematoma and compartment syndrome left lower extremity.  Patient states he feels much better.  There is 300 cc in the wound VAC canister.  Patient had extensive nonviable skin essentially circumferentially around the left leg.  Will plan for repeat debridement and application of tissue graft tomorrow.

## 2024-05-27 NOTE — H&P (View-Only) (Signed)
 Patient ID: Terry [name], male   DOB: [birth year], 89 y.o.   MRN: [id number] Patient is status post extensive debridement for hematoma and compartment syndrome left lower extremity.  Patient states he feels much better.  There is 300 cc in the wound VAC canister.  Patient had extensive nonviable skin essentially circumferentially around the left leg.  Will plan for repeat debridement and application of tissue graft tomorrow.

## 2024-05-27 NOTE — Progress Notes (Signed)
 PHARMACY - ANTICOAGULATION CONSULT NOTE  Pharmacy Consult for heparin  Indication: mech MVR (warfarin PTA on hold)  Allergies[1]  Patient Measurements: Height: 5' 11 (180.3 cm) Weight: 85 kg (187 lb 6.3 oz) IBW/kg (Calculated) : 75.3 HEPARIN  DW (KG): 85  Vital Signs: Temp: 97.9 F (36.6 C) (01/15 1753) Temp Source: Oral (01/15 1430) BP: 102/90 (01/15 1753) Pulse Rate: 83 (01/15 1753)  Labs: Recent Labs    05/25/24 0550 05/25/24 2050 05/26/24 0432 05/27/24 0412 05/27/24 1917  HGB 7.4* 9.1* 8.5* 8.1*  --   HCT 22.1* 27.4* 25.7* 25.0*  --   PLT 309  --  365 398  --   HEPARINUNFRC 0.40  --  0.38  --  0.38  CREATININE 0.80  --  0.80  --   --     Estimated Creatinine Clearance: 70.6 mL/min (by C-G formula based on SCr of 0.8 mg/dL).  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 Warfarin reversed with vitamin K po 2.5mg  x1. Pharmacy consulted for heparin  when INR < 2.5, started 1/9.    Heparin  level therapeutic x2 on 1350 units/hr. No overt bleeding noted, Hgb down 8s, platelets are normal, left leg hematoma without evidence of compartment syndrome. Per Ortho, planning I&D 1/14 and 1/16.  1/14 underwent debridement of L leg hematoma - ok to restart heparin  in AM per Ortho MD. Resume heparin  at previously therapeutic rate of 1350units/hr on 05/27/24 @ 0700 and check 8hr heparin  level.  1/15 PM: heparin  level 0.38 is therapeutic on 1350 units/hr. No issues with the infusion or overt bleeding reported per RN.   Goal of Therapy:  Heparin  level 0.3-0.5 units/ml INR 2.5- 3.5 Monitor platelets by anticoagulation protocol: Yes   Plan:  Continue heparin  at 1350units/hr  Check confirmatory heparin  level with AM labs Check heparin  level daily while on heparin  Continue to monitor H&H and platelets F/u [facility] plans following OR tomorrow  Rocky Slade, PharmD, BCPS 05/27/2024 8:18 PM     [1]   Allergies Allergen Reactions   Alendronate Other (See Comments)    GERD   Aspirin     too much bruising when added to Coumadin    Cipro [Ciprofloxacin Hcl] Other (See Comments)    Heartburn    Sulfa Drugs Cross Reactors Hives

## 2024-05-28 ENCOUNTER — Inpatient Hospital Stay (HOSPITAL_COMMUNITY): Admitting: Anesthesiology

## 2024-05-28 ENCOUNTER — Encounter (HOSPITAL_COMMUNITY): Payer: Self-pay | Admitting: Internal Medicine

## 2024-05-28 ENCOUNTER — Encounter (HOSPITAL_COMMUNITY)
Admission: EM | Disposition: A | Discharge status: Skilled Nursing Facility | Payer: Self-pay | Source: Home / Self Care | Attending: Family Medicine

## 2024-05-28 DIAGNOSIS — I10 Essential (primary) hypertension: Secondary | ICD-10-CM

## 2024-05-28 DIAGNOSIS — Z87891 Personal history of nicotine dependence: Secondary | ICD-10-CM | POA: Diagnosis not present

## 2024-05-28 DIAGNOSIS — E785 Hyperlipidemia, unspecified: Secondary | ICD-10-CM

## 2024-05-28 DIAGNOSIS — M79A22 Nontraumatic compartment syndrome of left lower extremity: Secondary | ICD-10-CM | POA: Diagnosis not present

## 2024-05-28 DIAGNOSIS — S8012XA Contusion of left lower leg, initial encounter: Secondary | ICD-10-CM

## 2024-05-28 DIAGNOSIS — T79A22A Traumatic compartment syndrome of left lower extremity, initial encounter: Secondary | ICD-10-CM | POA: Diagnosis not present

## 2024-05-28 DIAGNOSIS — T148XXA Other injury of unspecified body region, initial encounter: Secondary | ICD-10-CM | POA: Diagnosis not present

## 2024-05-28 HISTORY — PX: INCISION AND DRAINAGE OF DEEP ABSCESS, CALF: SHX7361

## 2024-05-28 LAB — HEPATIC FUNCTION PANEL
ALT: 90 U/L — ABNORMAL HIGH (ref 0–44)
AST: 73 U/L — ABNORMAL HIGH (ref 15–41)
Albumin: 2.9 g/dL — ABNORMAL LOW (ref 3.5–5.0)
Alkaline Phosphatase: 66 U/L (ref 38–126)
Bilirubin, Direct: 0.2 mg/dL (ref 0.0–0.2)
Indirect Bilirubin: 0.2 mg/dL — ABNORMAL LOW (ref 0.3–0.9)
Total Bilirubin: 0.4 mg/dL (ref 0.0–1.2)
Total Protein: 5.6 g/dL — ABNORMAL LOW (ref 6.5–8.1)

## 2024-05-28 LAB — BPAM RBC
Blood Product Expiration Date: 202601312359
Blood Product Expiration Date: 202601262359
ISSUE DATE / TIME: 202601151128
ISSUE DATE / TIME: 202601131307
Unit Type and Rh: 6200
Unit Type and Rh: 6200

## 2024-05-28 LAB — BASIC METABOLIC PANEL WITH GFR
Anion gap: 9 (ref 5–15)
BUN: 20 mg/dL (ref 8–23)
CO2: 24 mmol/L (ref 22–32)
Calcium: 8.1 mg/dL — ABNORMAL LOW (ref 8.9–10.3)
Chloride: 103 mmol/L (ref 98–111)
Creatinine, Ser: 0.76 mg/dL (ref 0.61–1.24)
GFR, Estimated: >60 mL/min
Glucose, Bld: 108 mg/dL — ABNORMAL HIGH (ref 70–99)
Potassium: 4.1 mmol/L (ref 3.5–5.1)
Sodium: 136 mmol/L (ref 135–145)

## 2024-05-28 LAB — TYPE AND SCREEN
ABO/RH(D): A POS
Antibody Screen: NEGATIVE
Unit division: 0
Unit division: 0

## 2024-05-28 LAB — CBC
HCT: 26.9 % — ABNORMAL LOW (ref 39.0–52.0)
Hemoglobin: 8.7 g/dL — ABNORMAL LOW (ref 13.0–17.0)
MCH: 31.1 pg (ref 26.0–34.0)
MCHC: 32.3 g/dL (ref 30.0–36.0)
MCV: 96.1 fL (ref 80.0–100.0)
Platelets: 406 K/uL — ABNORMAL HIGH (ref 150–400)
RBC: 2.8 MIL/uL — ABNORMAL LOW (ref 4.22–5.81)
RDW: 15.1 % (ref 11.5–15.5)
WBC: 11.5 K/uL — ABNORMAL HIGH (ref 4.0–10.5)
nRBC: 0 % (ref 0.0–0.2)

## 2024-05-28 LAB — HEPARIN LEVEL (UNFRACTIONATED): Heparin Unfractionated: 0.5 [IU]/mL (ref 0.30–0.70)

## 2024-05-28 LAB — CK: Total CK: 126 U/L (ref 49–397)

## 2024-05-28 MED ORDER — OXYCODONE HCL 5 MG/5ML PO SOLN: 5.0000 mg | Freq: Once | ORAL | Status: DC | PRN

## 2024-05-28 MED ORDER — HEPARIN (PORCINE) 25000 UT/250ML-% IV SOLN
1250.0000 [IU]/h | INTRAVENOUS | Status: AC
  Administered 2024-05-29 – 2024-05-30 (×2): 1300 [IU]/h via INTRAVENOUS
  Administered 2024-05-30: 1250 [IU]/h via INTRAVENOUS
  Administered 2024-06-01: 1150 [IU]/h via INTRAVENOUS
  Administered 2024-06-02 – 2024-06-03 (×2): 1200 [IU]/h via INTRAVENOUS
  Filled 2024-05-28 (×5): qty 250

## 2024-05-28 MED ORDER — CHLORHEXIDINE GLUCONATE 0.12 % MT SOLN
15.0000 mL | Freq: Once | OROMUCOSAL | Status: AC
  Administered 2024-05-28: 15 mL via OROMUCOSAL

## 2024-05-28 MED ORDER — CHLORHEXIDINE GLUCONATE 0.12 % MT SOLN
15.0000 mL | Freq: Once | OROMUCOSAL | Status: DC
  Filled 2024-05-28: qty 15

## 2024-05-28 MED ORDER — ORAL CARE MOUTH RINSE: 15.0000 mL | Freq: Once | OROMUCOSAL | Status: DC

## 2024-05-28 MED ORDER — ORAL CARE MOUTH RINSE: 15.0000 mL | Freq: Once | OROMUCOSAL | Status: AC

## 2024-05-28 MED ORDER — PROPOFOL 10 MG/ML IV BOLUS
INTRAVENOUS | Status: AC
  Filled 2024-05-28: qty 20

## 2024-05-28 MED ORDER — OXYCODONE HCL 5 MG PO TABS: 5.0000 mg | ORAL_TABLET | Freq: Once | ORAL | Status: DC | PRN

## 2024-05-28 MED ORDER — LACTATED RINGERS IV SOLN: INTRAVENOUS | Status: DC

## 2024-05-28 MED ORDER — PHENYLEPHRINE 80 MCG/ML (10ML) SYRINGE FOR IV PUSH (FOR BLOOD PRESSURE SUPPORT)
PREFILLED_SYRINGE | INTRAVENOUS | Status: DC | PRN
  Administered 2024-05-28: 240 ug via INTRAVENOUS
  Administered 2024-05-28: 160 ug via INTRAVENOUS
  Administered 2024-05-28: 80 ug via INTRAVENOUS
  Administered 2024-05-28: 160 ug via INTRAVENOUS

## 2024-05-28 MED ORDER — ONDANSETRON HCL 4 MG/2ML IJ SOLN
INTRAMUSCULAR | Status: AC
  Filled 2024-05-28: qty 2

## 2024-05-28 MED ORDER — FENTANYL CITRATE (PF) 100 MCG/2ML IJ SOLN
INTRAMUSCULAR | Status: AC
  Filled 2024-05-28: qty 2

## 2024-05-28 MED ORDER — PROPOFOL 10 MG/ML IV BOLUS
INTRAVENOUS | Status: DC | PRN
  Administered 2024-05-28: 100 mg via INTRAVENOUS

## 2024-05-28 MED ORDER — FENTANYL CITRATE (PF) 250 MCG/5ML IJ SOLN
INTRAMUSCULAR | Status: DC | PRN
  Administered 2024-05-28: 50 ug via INTRAVENOUS

## 2024-05-28 MED ORDER — PHENYLEPHRINE 80 MCG/ML (10ML) SYRINGE FOR IV PUSH (FOR BLOOD PRESSURE SUPPORT)
PREFILLED_SYRINGE | INTRAVENOUS | Status: AC
  Filled 2024-05-28: qty 10

## 2024-05-28 MED ORDER — ONDANSETRON HCL 4 MG/2ML IJ SOLN: 4.0000 mg | Freq: Once | INTRAMUSCULAR | Status: DC | PRN

## 2024-05-28 MED ORDER — VASHE WOUND IRRIGATION OPTIME
TOPICAL | Status: DC | PRN
  Administered 2024-05-28: 34 [oz_av]

## 2024-05-28 MED ORDER — FENTANYL CITRATE (PF) 100 MCG/2ML IJ SOLN
25.0000 ug | INTRAMUSCULAR | Status: DC | PRN
  Administered 2024-05-28: 25 ug via INTRAVENOUS
  Administered 2024-05-28: 50 ug via INTRAVENOUS

## 2024-05-28 MED ORDER — POVIDONE-IODINE 10 % EX SWAB
2.0000 | Freq: Once | CUTANEOUS | Status: AC
  Administered 2024-05-28: 2 via TOPICAL

## 2024-05-28 NOTE — Anesthesia Procedure Notes (Signed)
 Procedure Name: LMA Insertion Date/Time: 05/28/2024 12:39 PM  Performed by: Alen Motto D, CRNAPre-anesthesia Checklist: Patient identified, Emergency Drugs available, Suction available and Patient being monitored Patient Re-evaluated:Patient Re-evaluated prior to induction Oxygen Delivery Method: Circle System Utilized Preoxygenation: Pre-oxygenation with 100% oxygen Induction Type: IV induction Ventilation: Mask ventilation without difficulty LMA: LMA inserted LMA Size: 5.0 Number of attempts: 1 Airway Equipment and Method: Bite block Placement Confirmation: positive ETCO2 Tube secured with: Tape Dental Injury: Teeth and Oropharynx as per pre-operative assessment

## 2024-05-28 NOTE — Transfer of Care (Signed)
 Immediate Anesthesia Transfer of Care Note  Patient: Terry [name]  Procedure(s) Performed: INCISION AND DRAINAGE OF DEEP ABSCESS, CALF (Left: Leg Lower)  Patient Location: PACU  Anesthesia Type:General  Level of Consciousness: drowsy  Airway & Oxygen Therapy: Patient Spontanous Breathing and Patient connected to face mask oxygen  Post-op Assessment: Report given to RN and Post -op Vital signs reviewed and stable  Post vital signs: Reviewed and stable  Last Vitals:  Vitals Value Taken Time  BP    Temp    Pulse 88 05/28/24 13:38  Resp 22 05/28/24 13:38  SpO2 97 % 05/28/24 13:38  Vitals shown include unfiled device data.  Last Pain:  Vitals:   05/28/24 1129  TempSrc: Oral  PainSc: 0-No pain      Patients Stated Pain Goal: 0 (05/23/24 0156)  Complications: There were no known notable events for this encounter.

## 2024-05-28 NOTE — TOC Progression Note (Addendum)
 Transition of Care (TOC) - Progression Note   PT recommending HHPT and bedside commode.   Plan for more surgery today.   NCM  discussed above recommendations  with patient.   Patient has had [facility] in the past and prefers them.   Cory with [facility] accepted for HHPT , will need orders and face to face.   PT also recommending bedside commode . Patient in agreement . Entered order for MD to sign   Called Jermaine with [facility] for 3 in 1   Inpatient Care Management Team will continue to follow  Patient Details  Name: Terry [name] MRN: [id number] Date of Birth: 1935-11-27  Transition of Care [facility]) CM/SW Contact  Kimaria Struthers, Powell Jansky, RN Phone Number: 05/28/2024, 9:50 AM  Clinical Narrative:       Expected Discharge Plan:  (await post op PT/OT evals) Barriers to Discharge: Continued Medical Work up               Expected Discharge Plan and Services   Discharge Planning Services: CM Consult Post Acute Care Choice: NA Living arrangements for the past 2 months: Single Family Home                 DME Arranged:  (Await recommendations)         HH Arranged: NA           Social Drivers of Health (SDOH) Interventions SDOH Screenings   Food Insecurity: No Food Insecurity (05/20/2024)  Housing: Low Risk (05/20/2024)  Transportation Needs: No Transportation Needs (05/20/2024)  Utilities: Not At Risk (05/20/2024)  Social Connections: Socially Isolated (05/20/2024)  Tobacco Use: Medium Risk (05/26/2024)    Readmission Risk Interventions     No data to display

## 2024-05-28 NOTE — Progress Notes (Signed)
 PHARMACY - ANTICOAGULATION CONSULT NOTE  Pharmacy Consult for heparin  Indication: mech MVR (warfarin PTA on hold)  Allergies[1]  Patient Measurements: Height: 5' 11 (180.3 cm) Weight: 85 kg (187 lb 6.3 oz) IBW/kg (Calculated) : 75.3 HEPARIN  DW (KG): 85  Vital Signs: Temp: 98 F (36.7 C) (01/16 0800) Temp Source: Other (Comment) (01/16 0800) BP: 124/64 (01/16 0851) Pulse Rate: 87 (01/16 0851)  Labs: Recent Labs    05/26/24 0432 05/27/24 0412 05/27/24 1917 05/28/24 0521  HGB 8.5* 8.1*  --  8.7*  HCT 25.7* 25.0*  --  26.9*  PLT 365 398  --  406*  HEPARINUNFRC 0.38  --  0.38 0.50  CREATININE 0.80  --   --   --     Estimated Creatinine Clearance: 70.6 mL/min (by C-G formula based on SCr of 0.8 mg/dL).  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 Warfarin reversed with vitamin K po 2.5mg  x1. Pharmacy consulted for heparin  when INR < 2.5, started 1/9.     Heparin  level 0.5 is at higher end of therapeutic on 1350 units/hr. Heparin  held for I&D 1/16. CBC stable.  Goal of Therapy:  Heparin  level 0.3-0.5 units/ml INR 2.5- 3.5 Monitor platelets by anticoagulation protocol: Yes   Plan:  F/u restart heparin  at 1350units/hr F/u ability to liberalize heparin  goal   Monitor daily heparin  level, CBC, signs/symptoms of bleeding  F/u restart warfarin   Jinnie Door, PharmD, BCPS, BCCP Clinical Pharmacist  Please check AMION for all [facility] Pharmacy phone numbers After 10:00 PM, call Main Pharmacy [phone]     [1]  Allergies Allergen Reactions   Alendronate Other (See Comments)    GERD   Aspirin     too much bruising when added to Coumadin    Cipro [Ciprofloxacin Hcl] Other (See Comments)    Heartburn    Sulfa Drugs Cross Reactors Hives

## 2024-05-28 NOTE — Progress Notes (Signed)
 "  PROGRESS NOTE    OSMANY [name]  FMW:[id number] DOB: 02/27/1936 DOA: 05/20/2024 PCP: Shayne Anes, MD   Brief Narrative: Terry [name] is a 89 y.o. male with a history of mitral valve replacement with mechanical valve, primary hypertension, hyperlipidemia, BPH.  Patient presented secondary to worsening leg swelling related to leg trauma and complicated by Coumadin  use, found to have a hematoma and evidence of compartment syndrome. Orthopedic surgery with recommendation for I&D. Initial debridement on 1/14 with application of a wound vac.   Assessment and Plan:  Left lower extremity hematoma Secondary to injury and complicated by Coumadin  use. Significant symptoms. Empiric antibiotics started. Orthopedic surgery consulted and performed debridement for the hematoma and compartment syndrome. Per orthopedic surgery, patient is weight bearing as tolerated with a walker. PT/OT recommending home health services. - Orthopedic surgery recommendations: plan for repeat debridement on 1/16 - Continue Ceftriaxone  and linezolid   Primary hypertension - Continue Toprol  XL  History of mitral valve replacement Patient is on Coumadin  as an outpatient. Goal INR of 2.5 to 3.5; no evidence of supratherapeutic INR on admission, but was supratherapeutic around time of his injury. Coumadin  held on admission and started on Heparin  IV bridge. - Continue Heparin  IV bridge - Restart Coumadin  once no further surgical management planned  Hyperlipidemia Prior to arrival medication(s) include Crestor  which was held secondary to elevated AST/ALT; AST/ALT trending down.  Acute blood loss anemia Chronic normocytic anemia Baseline hemoglobin of about 9-11 but decreased to 7.4 secondary to hematoma. Patient received  a total of 1 unit of PRBC. Hemoglobin is stable. - Trend CBC   DVT prophylaxis: Heparin  IV Code Status:   Code Status: Full Code Family Communication: Niece at bedside Disposition Plan:  Discharge pending ongoing orthopedic surgery recommendations, stable hemoglobin, transition back to Coumadin    Consultants:  Orthopedic surgery  Procedures:  Orthopedic surgery (1/14) Excisional debridement of the hematoma left leg. Compartment release of the anterior lateral compartment left leg with fasciectomy. Tissue sent for cultures. Application of Kerecis micro graft 95 cm to cover wound surface area greater than 200 cm. Application vancomycin  powder 1 g and tobramycin  powder 1.2 g. Application of the cleanse choice wound VAC sponge in the peel and place wound VAC sponge x 2.  Antimicrobials: Ceftriaxone     Subjective: No issues this morning. Awaiting surgery today.  Objective: BP 124/65   Pulse 79   Temp 97.8 F (36.6 C) (Oral)   Resp 20   Ht 5' 11 (1.803 m)   Wt 85 kg   SpO2 92%   BMI 26.14 kg/m   Examination:  General exam: Appears calm and comfortable. Respiratory system: Clear to auscultation. Respiratory effort normal. Cardiovascular system: S1 & S2 heard, RRR. No murmur. Gastrointestinal system: Abdomen is nondistended, soft and nontender. Normal bowel sounds heard. Central nervous system: Alert. Musculoskeletal: Left leg with wound vac applied.    Data Reviewed: I have personally reviewed following labs and imaging studies  CBC Lab Results  Component Value Date   WBC 11.5 (H) 05/28/2024   RBC 2.80 (L) 05/28/2024   HGB 8.7 (L) 05/28/2024   HCT 26.9 (L) 05/28/2024   MCV 96.1 05/28/2024   MCH 31.1 05/28/2024   PLT 406 (H) 05/28/2024   MCHC 32.3 05/28/2024   RDW 15.1 05/28/2024   LYMPHSABS 0.5 (L) 05/20/2024   MONOABS 1.1 (H) 05/20/2024   EOSABS 0.2 05/20/2024   BASOSABS 0.0 05/20/2024     Last metabolic panel Lab Results  Component Value Date  NA 134 (L) 05/26/2024   K 4.0 05/26/2024   CL 102 05/26/2024   CO2 24 05/26/2024   BUN 17 05/26/2024   CREATININE 0.80 05/26/2024   GLUCOSE 99 05/26/2024   GFRNONAA >60 05/26/2024    CALCIUM  8.2 (L) 05/26/2024   PROT 6.0 (L) 05/26/2024   ALBUMIN 2.9 (L) 05/26/2024   BILITOT 0.5 05/26/2024   ALKPHOS 68 05/26/2024   AST 56 (H) 05/26/2024   ALT 52 (H) 05/26/2024   ANIONGAP 8 05/26/2024    GFR: Estimated Creatinine Clearance: 70.6 mL/min (by C-G formula based on SCr of 0.8 mg/dL).  Recent Results (from the past 240 hours)  Blood culture (routine x 2)     Status: None   Collection Time: 05/20/24  3:55 PM   Specimen: BLOOD  Result Value Ref Range Status   Specimen Description BLOOD RIGHT ANTECUBITAL  Final   Special Requests   Final    BOTTLES DRAWN AEROBIC AND ANAEROBIC Blood Culture adequate volume   Culture   Final    NO GROWTH 5 DAYS Performed at [facility], [address] [place], [place] [address]    Report Status 05/25/2024 FINAL  Final  Blood culture (routine x 2)     Status: None   Collection Time: 05/20/24  6:19 PM   Specimen: BLOOD  Result Value Ref Range Status   Specimen Description BLOOD SITE NOT SPECIFIED  Final   Special Requests   Final    BOTTLES DRAWN AEROBIC AND ANAEROBIC Blood Culture adequate volume   Culture   Final    NO GROWTH 5 DAYS Performed at [facility], [address], [place], [place] [address]    Report Status 05/25/2024 FINAL  Final  Aerobic Culture w Gram Stain (superficial specimen)     Status: None   Collection Time: 05/20/24  6:19 PM   Specimen: Wound  Result Value Ref Range Status   Specimen Description WOUND  Final   Special Requests NONE  Final   Gram Stain NO WBC SEEN NO ORGANISMS SEEN   Final   Culture   Final    NO GROWTH 2 DAYS Performed at [facility] Lab, [address] [place], [place] [address]    Report Status 05/25/2024 FINAL  Final  MRSA Next Gen by PCR, Nasal     Status: None   Collection Time: 05/24/24 11:01 AM   Specimen: Nasal Mucosa; Nasal Swab  Result Value Ref Range Status   MRSA by PCR Next Gen NOT DETECTED NOT DETECTED Final    Comment: (NOTE) The GeneXpert MRSA  Assay (FDA approved for NASAL specimens only), is one component of a comprehensive MRSA colonization surveillance program. It is not intended to diagnose MRSA infection nor to guide or monitor treatment for MRSA infections. Test performance is not FDA approved in patients less than 57 years old. Performed at [facility], [address] [place], [place] [address]   MRSA Next Gen by PCR, Nasal     Status: None   Collection Time: 05/27/24  4:51 PM   Specimen: Nasal Mucosa; Nasal Swab  Result Value Ref Range Status   MRSA by PCR Next Gen NOT DETECTED NOT DETECTED Final    Comment: (NOTE) The GeneXpert MRSA Assay (FDA approved for NASAL specimens only), is one component of a comprehensive MRSA colonization surveillance program. It is not intended to diagnose MRSA infection nor to guide or monitor treatment for MRSA infections. Test performance is not FDA  approved in patients less than 4 years old. Performed at [facility] Lab, [address], [place], [place] [address]       Radiology Studies: No results found.    LOS: 8 days    Elgin Lam, MD [facility] 05/28/2024, 11:32 AM   If 7PM-7AM, please contact night-coverage [email or web address]  "

## 2024-05-28 NOTE — Op Note (Signed)
 05/28/2024  1:41 PM  PATIENT:  Terry [name]    PRE-OPERATIVE DIAGNOSIS:  Left Leg Hematoma and compartment syndrome  POST-OPERATIVE DIAGNOSIS:  Same  PROCEDURE: Excisional debridement skin and soft tissue muscle and fascia left leg. Application of Kerecis tissue graft 7 x 10 cm x 4 and application of Kerecis micro graft 95 cm x 2 to cover a wound surface area of 900 cm.  SURGEON:  Jerona LULLA Sage, MD  PHYSICIAN ASSISTANT:None ANESTHESIA:   General  PREOPERATIVE INDICATIONS:  Terry [name] is a  89 y.o. male with a diagnosis of Left Leg Hematoma who failed conservative measures and elected for surgical management.    The risks benefits and alternatives were discussed with the patient preoperatively including but not limited to the risks of infection, bleeding, nerve injury, cardiopulmonary complications, the need for revision surgery, among others, and the patient was willing to proceed.  OPERATIVE IMPLANTS:   Implant Name Type Inv. Item Serial No. Manufacturer Lot No. LRB No. Used Action  GRAFT SKIN WND OMEGA3 SB 7X10 - [id number] Tissue GRAFT SKIN WND OMEGA3 SB 7X10  [employer] [id number] Left 1 Implanted  GRAFT SKIN WND OMEGA3 SB 7X10 - [id number] Tissue GRAFT SKIN WND OMEGA3 SB 7X10  [employer] [id number] Left 1 Implanted  GRAFT SKIN WND SURGICLOSE M95 - [id number] Tissue GRAFT SKIN WND SURGICLOSE M95  [employer] [id number] Left 1 Implanted  GRAFT SKIN WND SURGIBIND 7X20 - [id number] Tissue GRAFT SKIN WND SURGIBIND 7X20  [employer] [id number] Left 1 Implanted  GRAFT SKIN WND OMEGA3 SB 7X10 - [id number] Tissue GRAFT SKIN WND OMEGA3 SB 7X10  [employer] [id number] Left 1 Implanted  GRAFT SKIN WND SURGICLOSE M95 - [id number] Tissue GRAFT SKIN WND SURGICLOSE M95  [employer] [id number] Left 1 Implanted    @ENCIMAGES @  OPERATIVE FINDINGS: Muscle had good color and contractility.  There was further necrotic changes to the skin which necessitated a more  extensive excisional debridement.  OPERATIVE PROCEDURE: Patient was brought the operating room and underwent a general anesthetic.  After adequate levels anesthesia obtained patient's left lower extremity was prepped using DuraPrep draped into a sterile field a timeout was called.  A 21 blade knife and rondure were used to excise skin and soft tissue muscle and fascia which left the wound 30 x 30 cm.  Electrocautery was used for hemostasis the wound was irrigated with Vashe.  Kerecis micro graft 95 cm x 2 was used to reinforce the soft tissue and soft tissue coverage was provided by 4 of the 7 x 10 sheets.  This was then covered with 2 large peel and place wound VAC dressings this was secured with Ioban and this had a good suction fit was extubated taken the PACU in stable condition   DISCHARGE PLANNING:  Antibiotic duration: Continue antibiotics based on culture sensitivities  Weightbearing: Weightbearing as tolerated  Pain medication: Opioid pathway  Dressing care/ Wound VAC: Wound VAC continue for 1 week  Ambulatory devices: Walker  Discharge to: Discharge planning based on therapy recommendations  Follow-up: In the office 1 week post operative.

## 2024-05-28 NOTE — Progress Notes (Signed)
 PHARMACY - ANTICOAGULATION CONSULT NOTE  Pharmacy Consult for heparin  Indication: mech MVR (warfarin PTA on hold)  Allergies[1]  Patient Measurements: Height: 5' 11 (180.3 cm) Weight: 85 kg (187 lb 6.3 oz) IBW/kg (Calculated) : 75.3 HEPARIN  DW (KG): 85  Vital Signs: Temp: 97.7 F (36.5 C) (01/16 1428) Temp Source: Axillary (01/16 1428) BP: 140/67 (01/16 1428) Pulse Rate: 82 (01/16 1428)  Labs: Recent Labs    05/26/24 0432 05/27/24 0412 05/27/24 1917 05/28/24 0521 05/28/24 1504  HGB 8.5* 8.1*  --  8.7*  --   HCT 25.7* 25.0*  --  26.9*  --   PLT 365 398  --  406*  --   HEPARINUNFRC 0.38  --  0.38 0.50  --   CREATININE 0.80  --   --   --  0.76  CKTOTAL  --   --   --   --  126    Estimated Creatinine Clearance: 70.6 mL/min (by C-G formula based on SCr of 0.76 mg/dL).  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 Warfarin reversed with vitamin K po 2.5mg  x1. Pharmacy consulted for heparin  when INR < 2.5, started 1/9.     Heparin  level 0.5 is at higher end of therapeutic on 1350 units/hr. Heparin  held for I&D 1/16. CBC stable.  PM: now s/p I&D, per Dr. Harden - ok to resume heparin  1/17 AM, continue to use reduced goal.  Goal of Therapy:  Heparin  level 0.3-0.5 units/ml INR 2.5- 3.5 Monitor platelets by anticoagulation protocol: Yes   Plan:  At 08:00 tomorrow (1/17) Resume heparin  1300 units/hr Check heparin  level 8hrs after resuming Monitor daily heparin  level, CBC, signs/symptoms of bleeding  F/u restart warfarin   Rocky Slade, PharmD, BCPS Clinical Pharmacist  Please check AMION for all [facility] Pharmacy phone numbers After 10:00 PM, call Main Pharmacy [phone]      [1]  Allergies Allergen Reactions   Alendronate Other (See Comments)    GERD   Aspirin     too much bruising when added to Coumadin    Cipro [Ciprofloxacin Hcl] Other (See Comments)    Heartburn    Sulfa Drugs  Cross Reactors Hives

## 2024-05-28 NOTE — Plan of Care (Signed)

## 2024-05-28 NOTE — Interval H&P Note (Signed)
 History and Physical Interval Note:  05/28/2024 7:00 AM  Terry [name]  has presented today for surgery, with the diagnosis of Left Leg Hematoma.  The various methods of treatment have been discussed with the patient and family. After consideration of risks, benefits and other options for treatment, the patient has consented to  Procedures with comments: INCISION AND DRAINAGE OF DEEP ABSCESS, CALF (Left) - Excisional Debridement Left Leg as a surgical intervention.  The patient's history has been reviewed, patient examined, no change in status, stable for surgery.  I have reviewed the patient's chart and labs.  Questions were answered to the patient's satisfaction.     Jaquila Santelli V Dawsen Krieger

## 2024-05-28 NOTE — Anesthesia Preprocedure Evaluation (Addendum)
"                                    Anesthesia Evaluation  Patient identified by MRN, date of birth, ID band Patient awake    Reviewed: Allergy & Precautions, NPO status , Patient's Chart, lab work & pertinent test results, reviewed documented beta blocker date and time   History of Anesthesia Complications Negative for: history of anesthetic complications  Airway Mallampati: III  TM Distance: <3 FB Neck ROM: Full    Dental  (+) Dental Advisory Given   Pulmonary former smoker   Pulmonary exam normal        Cardiovascular hypertension, Pt. on home beta blockers and Pt. on medications + Cardiac Stents  Normal cardiovascular exam+ dysrhythmias Ventricular Tachycardia + Valvular Problems/Murmurs (s/p MVR)    '23 TTE - EF 60 to 65%. Grade I diastolic dysfunction (impaired relaxation). S/p mechanical MVR 2003. MV mean gradient 4 mmHg at HR 92 bpm. No significant stenosis; normal prosthesis.     Neuro/Psych negative neurological ROS  negative psych ROS   GI/Hepatic negative GI ROS, Neg liver ROS,,,  Endo/Other  negative endocrine ROS    Renal/GU negative Renal ROS     Musculoskeletal negative musculoskeletal ROS (+)    Abdominal   Peds  Hematology  (+) Blood dyscrasia, anemia  On coumadin     Anesthesia Other Findings   Reproductive/Obstetrics                              Anesthesia Physical Anesthesia Plan  ASA: 3  Anesthesia Plan: General   Post-op Pain Management:    Induction: Intravenous  PONV Risk Score and Plan: 2 and Treatment may vary due to age or medical condition and Ondansetron   Airway Management Planned: LMA  Additional Equipment: None  Intra-op Plan:   Post-operative Plan: Extubation in OR  Informed Consent: I have reviewed the patients History and Physical, chart, labs and discussed the procedure including the risks, benefits and alternatives for the proposed anesthesia with the patient or  authorized representative who has indicated his/her understanding and acceptance.     Dental advisory given  Plan Discussed with: CRNA and Anesthesiologist  Anesthesia Plan Comments:          Anesthesia Quick Evaluation  "

## 2024-05-28 NOTE — Progress Notes (Signed)
 Dr. Harden requested the Heparin  drip to be stopped prior surgery. Floor RN was notified

## 2024-05-28 NOTE — Anesthesia Postprocedure Evaluation (Signed)
"   Anesthesia Post Note  Patient: Terry [name]  Procedure(s) Performed: INCISION AND DRAINAGE OF DEEP ABSCESS, CALF (Left: Leg Lower)     Patient location during evaluation: PACU Anesthesia Type: General Level of consciousness: awake and alert Pain management: pain level controlled Vital Signs Assessment: post-procedure vital signs reviewed and stable Respiratory status: spontaneous breathing, nonlabored ventilation and respiratory function stable Cardiovascular status: stable and blood pressure returned to baseline Anesthetic complications: no   There were no known notable events for this encounter.  Last Vitals:  Vitals:   05/28/24 1442 05/28/24 1450  BP:    Pulse:    Resp:    Temp:    SpO2: 100% 96%    Last Pain:  Vitals:   05/28/24 1543  TempSrc:   PainSc: 3                  Debby FORBES Like      "

## 2024-05-28 NOTE — Progress Notes (Signed)
 OT Cancellation Note  Patient Details Name: Terry [name] MRN: [id number] DOB: [birth year]   Cancelled Treatment:    Reason Eval/Treat Not Completed: Patient at procedure or test/ unavailable. Pt at a procedure for his leg.  [name] OT Acute Rehabilitation Services Office [phone]  Rodgers Dorothyann Distel 05/28/2024, 11:25 AM

## 2024-05-29 DIAGNOSIS — T79A22A Traumatic compartment syndrome of left lower extremity, initial encounter: Secondary | ICD-10-CM | POA: Diagnosis not present

## 2024-05-29 DIAGNOSIS — T148XXA Other injury of unspecified body region, initial encounter: Secondary | ICD-10-CM | POA: Diagnosis not present

## 2024-05-29 LAB — CBC
HCT: 26 % — ABNORMAL LOW (ref 39.0–52.0)
Hemoglobin: 8.4 g/dL — ABNORMAL LOW (ref 13.0–17.0)
MCH: 31.3 pg (ref 26.0–34.0)
MCHC: 32.3 g/dL (ref 30.0–36.0)
MCV: 97 fL (ref 80.0–100.0)
Platelets: 437 K/uL — ABNORMAL HIGH (ref 150–400)
RBC: 2.68 MIL/uL — ABNORMAL LOW (ref 4.22–5.81)
RDW: 15.2 % (ref 11.5–15.5)
WBC: 10.1 K/uL (ref 4.0–10.5)
nRBC: 0 % (ref 0.0–0.2)

## 2024-05-29 LAB — HEPARIN LEVEL (UNFRACTIONATED)
Heparin Unfractionated: <0.1 [IU]/mL — ABNORMAL LOW (ref 0.30–0.70)
Heparin Unfractionated: <0.1 [IU]/mL — ABNORMAL LOW (ref 0.30–0.70)
Heparin Unfractionated: 0.43 [IU]/mL (ref 0.30–0.70)

## 2024-05-29 MED ORDER — WARFARIN - PHARMACIST DOSING INPATIENT: Freq: Every day | Status: DC

## 2024-05-29 MED ORDER — WARFARIN SODIUM 7.5 MG PO TABS
7.5000 mg | ORAL_TABLET | Freq: Once | ORAL | Status: AC
  Administered 2024-05-29: 7.5 mg via ORAL
  Filled 2024-05-29: qty 1

## 2024-05-29 NOTE — Progress Notes (Signed)
 PHARMACY - ANTICOAGULATION CONSULT NOTE  Pharmacy Consult for Heparin  and Warfarin Indication: mech MVR   Patient Measurements: Height: 5' 11 (180.3 cm) Weight: 85 kg (187 lb 6.3 oz) IBW/kg (Calculated) : 75.3 HEPARIN  DW (KG): 85  Vital Signs: Temp: 97.6 F (36.4 C) (01/17 1010) Temp Source: Oral (01/17 1010) BP: 123/57 (01/17 1010) Pulse Rate: 86 (01/17 1010)  Labs: Recent Labs    05/27/24 0412 05/27/24 1917 05/28/24 0521 05/28/24 1504 05/29/24 0339 05/29/24 1550 05/29/24 1654  HGB 8.1*  --  8.7*  --  8.4*  --   --   HCT 25.0*  --  26.9*  --  26.0*  --   --   PLT 398  --  406*  --  437*  --   --   HEPARINUNFRC  --    < > 0.50  --  <0.10* <0.10* 0.43  CREATININE  --   --   --  0.76  --   --   --   CKTOTAL  --   --   --  126  --   --   --    < > = values in this interval not displayed.    Estimated Creatinine Clearance: 70.6 mL/min (by C-G formula based on SCr of 0.76 mg/dL).  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 Warfarin reversed with vitamin K po 2.5mg  x1  05/20/24  Heparin  resumed this AM at 8 am Warfarin to resume today  PM: heparin  level 0.43 on 1300 units/hr. No issues with the infusion or bleeding reported per RN.  Goal of Therapy:  Heparin  level 0.3-0.5 units/ml INR 2.5- 3.5 Monitor platelets by anticoagulation protocol: Yes   Plan:  Continue Heparin  at 1300units/hr Warfarin 7.5 mg po x 1 dose today given as previously ordered Monitor daily heparin  level, CBC, INR  Thank you. Rocky Slade, PharmD, BCPS  Please check AMION for all [facility] Pharmacy phone numbers After 10:00 PM, call Main Pharmacy [phone]

## 2024-05-29 NOTE — Progress Notes (Signed)
 Mobility Specialist Progress Note:    05/29/24 1255  Mobility  Activity Pivoted/transferred from bed to chair  Level of Assistance Contact guard assist, steadying assist  Assistive Device Front wheel walker  Distance Ambulated (ft) 8 ft  LLE Weight Bearing Per Provider Order WBAT  Activity Response Tolerated well  Mobility Referral Yes  Mobility visit 1 Mobility  Mobility Specialist Start Time (ACUTE ONLY) 1236  Mobility Specialist Stop Time (ACUTE ONLY) 1255  Mobility Specialist Time Calculation (min) (ACUTE ONLY) 19 min   Received pt in bed and eager for OOB mobility. Pt required MinG for safety. Pt c/o LLE pain, otherwise tolerated well. Left pt in chair with alarm on. Personal belongings and call light within reach.  Lavanda Pollack Mobility Specialist  Please contact via [employer] or  Rehab Office [phone]

## 2024-05-29 NOTE — Plan of Care (Signed)
" °  Problem: Coping: Goal: Level of anxiety will decrease Outcome: Progressing   Problem: Safety: Goal: Ability to remain free from injury will improve Outcome: Progressing   Problem: Respiratory: Goal: Will regain and/or maintain adequate ventilation Outcome: Progressing   Problem: Urinary Elimination: Goal: Will remain free from infection Outcome: Progressing   "

## 2024-05-29 NOTE — Progress Notes (Signed)
 Patient ID: Terry [name], male   DOB: 07-23-1935, 89 y.o.   MRN: [id number] Patient is postoperative day 1 repeat debridement and tissue graft for the left leg hematoma.  There is 150 cc in the wound VAC canister.  Anticipate patient will need discharge to skilled nursing.  Patient may be weightbearing as tolerated with therapy.

## 2024-05-29 NOTE — Progress Notes (Signed)
 PHARMACY - ANTICOAGULATION CONSULT NOTE  Pharmacy Consult for Heparin  and Warfarin Indication: mech MVR   Patient Measurements: Height: 5' 11 (180.3 cm) Weight: 85 kg (187 lb 6.3 oz) IBW/kg (Calculated) : 75.3 HEPARIN  DW (KG): 85  Vital Signs: Temp: 97.6 F (36.4 C) (01/17 1010) Temp Source: Oral (01/17 1010) BP: 123/57 (01/17 1010) Pulse Rate: 86 (01/17 1010)  Labs: Recent Labs    05/27/24 0412 05/27/24 1917 05/28/24 0521 05/28/24 1504 05/29/24 0339  HGB 8.1*  --  8.7*  --  8.4*  HCT 25.0*  --  26.9*  --  26.0*  PLT 398  --  406*  --  437*  HEPARINUNFRC  --  0.38 0.50  --  <0.10*  CREATININE  --   --   --  0.76  --   CKTOTAL  --   --   --  126  --     Estimated Creatinine Clearance: 70.6 mL/min (by C-G formula based on SCr of 0.76 mg/dL).  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 Warfarin reversed with vitamin K po 2.5mg  x1  05/20/24  Heparin  resumed this AM at 8 am Warfarin to resume today  Goal of Therapy:  Heparin  level 0.3-0.5 units/ml INR 2.5- 3.5 Monitor platelets by anticoagulation protocol: Yes   Plan:  Heparin  at 1350units/hr Heparin  level at 1600 pm Warfarin 7.5 mg po x 1 dose today Monitor daily heparin  level, CBC, INR  Thank you. Olam Monte, PharmD  Please check AMION for all [facility] Pharmacy phone numbers After 10:00 PM, call Main Pharmacy [phone]

## 2024-05-29 NOTE — Evaluation (Signed)
 Occupational Therapy Evaluation Patient Details Name: Terry [name] MRN: [id number] DOB: [birth year] Today's Date: 05/29/2024   History of Present Illness   89 y.o. male presented 05/20/24 for worsening leg swelling related to leg trauma and complicated by Coumadin  use. Found to have a hematoma and evidence of compartment syndrome. Pt underwent excisional debridement, compartment release of the anterior and lateral compartment of LLE with fasciectomy, and application of wound vac 1/14. Pending repeat I&D and tissue graft tomorrow (1/16). PMHx: mitral valve replacement with mechanical valve, primary HTN, HLD, and BPH.     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 Pt currently requires up to Min A for functional transfers and up to Max A for ADL task engagement. Pt primarily limited by decreased activity tolerance/endurance, pain, generalized weakness, and unsteadiness on feet. OT to continue to follow Pt acutely to facilitate progress towards goals. Recommend HHOT services at d/c to improve safe occupational engagement in the home environment.      If plan is discharge home, recommend the following:   A little help with walking and/or transfers;A lot of help with bathing/dressing/bathroom;Assistance with cooking/housework;Direct supervision/assist for medications management;Assist for transportation;Help with stairs or ramp for entrance     Functional Status Assessment   Patient has had a recent decline in their functional status and demonstrates the ability to make significant improvements in function in a reasonable and predictable amount of time.     Equipment Recommendations   BSC/3in1     Recommendations for Other Services         Precautions/Restrictions   Precautions Precautions: Fall Recall of Precautions/Restrictions: Intact Precaution/Restrictions Comments: Wound Vac LLE  with double lines Restrictions Weight Bearing Restrictions Per Provider Order: Yes LLE Weight Bearing Per Provider Order: Weight bearing as tolerated     Mobility Bed Mobility               General bed mobility comments: Pt greeted in recliner and returned to recliner    Transfers Overall transfer level: Needs assistance Equipment used: Rolling walker (2 wheels) Transfers: Sit to/from Stand Sit to Stand: Min assist           General transfer comment: Min A to stand from recliner with tactile cues for anterior weight shift and trunk extension. Pt with  increased time to allow L foot weight acceptance. Pt able to take 5 steps forwards and backwards with Min A      Balance Overall balance assessment: Needs assistance Sitting-balance support: No upper extremity supported, Feet supported Sitting balance-Leahy Scale: Fair     Standing balance support: Bilateral upper extremity supported, During functional activity, Reliant on assistive device for balance Standing balance-Leahy Scale: Poor Standing balance comment: Dependent on RW and external support                           ADL either performed or assessed with clinical judgement   ADL Overall ADL's : Needs assistance/impaired Eating/Feeding: Independent   Grooming: Set up;Sitting   Upper Body Bathing: Set up;Sitting   Lower Body Bathing: Maximal assistance   Upper Body Dressing : Set up;Sitting   Lower Body Dressing: Maximal assistance   Toilet Transfer: Minimal assistance;BSC/3in1;Ambulation;Rolling walker (2 wheels)   Toileting- Clothing Manipulation and Hygiene: Moderate assistance               Vision Patient Visual Report: No change from baseline Vision  Assessment?: No apparent visual deficits     Perception         Praxis         Pertinent Vitals/Pain Pain Assessment Pain Assessment: No/denies pain Pain Intervention(s): Monitored during session     Extremity/Trunk  Assessment Upper Extremity Assessment Upper Extremity Assessment: Generalized weakness   Lower Extremity Assessment Lower Extremity Assessment: Defer to PT evaluation   Cervical / Trunk Assessment Cervical / Trunk Assessment: Normal   Communication Communication Communication: Impaired Factors Affecting Communication: Hearing impaired (hearing aids)   Cognition Arousal: Alert Behavior During Therapy: WFL for tasks assessed/performed Cognition: No apparent impairments             OT - Cognition Comments: Pt is pleasant and eager to return to independence                 Following commands: Intact       Cueing  General Comments   Cueing Techniques: Verbal cues  VSS on RA. Pt eager to engage in therapy   Exercises     Shoulder Instructions      Home Living Family/patient expects to be discharged to:: Private residence Living Arrangements: Alone Available Help at Discharge: Family;Available 24 hours/day (niece 24/7) Type of Home: House (tiny home on niece property)   Secretary/administrator of Steps: 2 Entrance Stairs-Rails: None Home Layout: One level     Bathroom Shower/Tub: Producer, Television/film/video: Standard Bathroom Accessibility: No (thinks he could fit a RW but not w/c)   Home Equipment: Agricultural Consultant (2 wheels);Other (comment);Grab bars - toilet (treking poles; urinal)   Additional Comments: Niece lives very close by, house on the same property.      Prior Functioning/Environment Prior Level of Function : Independent/Modified Independent;Driving             Mobility Comments: Indep without AD. Uses treking poles on his morning walks across the side walks going ~2 miles. 1 fall leading to current admission. ADLs Comments: Indep with self-care. 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    OT Problem List: Decreased strength;Decreased activity tolerance;Impaired balance (sitting and/or  standing);Decreased knowledge of use of DME or AE;Decreased knowledge of precautions;Pain   OT Treatment/Interventions: Self-care/ADL training;Therapeutic exercise;Energy conservation;DME and/or AE instruction;Therapeutic activities;Patient/family education;Balance training      OT Goals(Current goals can be found in the care plan section)   Acute Rehab OT Goals Patient Stated Goal: to be independent again OT Goal Formulation: With patient Time For Goal Achievement: 06/12/24 Potential to Achieve Goals: Good ADL Goals Pt Will Perform Grooming: with contact guard assist;standing Pt Will Perform Lower Body Dressing: with min assist;with adaptive equipment Pt Will Transfer to Toilet: with supervision;bedside commode;ambulating Pt/caregiver will Perform Home Exercise Program: Increased strength;Both right and left upper extremity;With written HEP provided   OT Frequency:  Min 2X/week    Co-evaluation              AM-PAC OT 6 Clicks Daily Activity     Outcome Measure Help from another person eating meals?: None Help from another person taking care of personal grooming?: A Little Help from another person toileting, which includes using toliet, bedpan, or urinal?: A Lot Help from another person bathing (including washing, rinsing, drying)?: A Lot Help from another person to put on and taking off regular upper body clothing?: A Little Help from another person to put on and taking off regular lower body clothing?: A Lot 6 Click  Score: 16   End of Session Equipment Utilized During Treatment: Gait belt;Rolling walker (2 wheels) Nurse Communication: Mobility status  Activity Tolerance: Patient tolerated treatment well Patient left: in chair;with call bell/phone within reach;with chair alarm set  OT Visit Diagnosis: Unsteadiness on feet (R26.81);Muscle weakness (generalized) (M62.81);Pain                Time: 1313-1330 OT Time Calculation (min): 17 min Charges:  OT General  Charges $OT Visit: 1 Visit OT Evaluation $OT Eval Low Complexity: 1 Low  Maurilio CROME, OTR/L.  [facility] Acute Rehabilitation  Office: [phone]   Maurilio PARAS Jaydin Boniface 05/29/2024, 2:14 PM

## 2024-05-29 NOTE — Plan of Care (Signed)
  Problem: Pain Managment: Goal: General experience of comfort will improve and/or be controlled Outcome: Progressing   Problem: Safety: Goal: Ability to remain free from injury will improve Outcome: Progressing   Problem: Skin Integrity: Goal: Risk for impaired skin integrity will decrease Outcome: Progressing

## 2024-05-29 NOTE — Progress Notes (Signed)
 "  PROGRESS NOTE    Terry [name]  FMW:[id number] DOB: Nov 04, 1935 DOA: 05/20/2024 PCP: Shayne Anes, MD   Brief Narrative: Terry [name] is a 89 y.o. male with a history of mitral valve replacement with mechanical valve, primary hypertension, hyperlipidemia, BPH.  Patient presented secondary to worsening leg swelling related to leg trauma and complicated by Coumadin  use, found to have a hematoma and evidence of compartment syndrome. Orthopedic surgery with recommendation for I&D. Initial debridement on 1/14 with application of a wound vac.   Assessment and Plan:  Left lower extremity hematoma Secondary to injury and complicated by Coumadin  use. Significant symptoms. Empiric antibiotics started. Orthopedic surgery consulted and performed debridement for the hematoma and compartment syndrome on 1/14 and 1/16. Per orthopedic surgery, patient is weight bearing as tolerated with a walker. PT/OT recommending home health services however, after second surgery, orthopedic surgery recommends SNF. - Orthopedic surgery recommendations: plan for repeat debridement on 1/16 - Continue Ceftriaxone  and linezolid   Primary hypertension - Continue Toprol  XL  History of mitral valve replacement Patient is on Coumadin  as an outpatient. Goal INR of 2.5 to 3.5; no evidence of supratherapeutic INR on admission, but was supratherapeutic around time of his injury. Coumadin  held on admission and started on Heparin  IV bridge. - Continue Heparin  IV bridge; transition to Lovenox  when ready for discharge to complete bridge to Coumadin  - Restart Coumadin  (goal INR 2.5 - 3.5)  Hyperlipidemia Prior to arrival medication(s) include Crestor  which was held secondary to elevated AST/ALT; AST/ALT trending down.  Acute blood loss anemia Chronic normocytic anemia Baseline hemoglobin of about 9-11 but decreased to 7.4 secondary to hematoma. Patient received  a total of 1 unit of PRBC. Hemoglobin is stable.   DVT  prophylaxis: Heparin  IV Code Status:   Code Status: Full Code Family Communication: None at bedside Disposition Plan: Discharge pending ongoing orthopedic surgery recommendations, stable hemoglobin, transition back to Coumadin ; Orthopedic surgery expects likely need to discharge to SNF.   Consultants:  Orthopedic surgery  Procedures:  Orthopedic surgery (1/14) Excisional debridement of the hematoma left leg. Compartment release of the anterior lateral compartment left leg with fasciectomy. Tissue sent for cultures. Application of Kerecis micro graft 95 cm to cover wound surface area greater than 200 cm. Application vancomycin  powder 1 g and tobramycin  powder 1.2 g. Application of the cleanse choice wound VAC sponge in the peel and place wound VAC sponge x 2. Orthopedic surgery (1/16) Excisional debridement skin and soft tissue muscle and fascia left leg. Application of Kerecis tissue graft 7 x 10 cm x 4 and application of Kerecis micro graft 95 cm x 2 to cover a wound surface area of 900 cm  Antimicrobials: Ceftriaxone     Subjective: Patient states his leg feels great. No issues overnight.  Objective: BP (!) 123/57 (BP Location: Right Arm)   Pulse 86   Temp 97.6 F (36.4 C) (Oral)   Resp 16   Ht 5' 11 (1.803 m)   Wt 85 kg   SpO2 96%   BMI 26.14 kg/m   Examination:  General exam: Appears calm and comfortable. Respiratory system: Clear to auscultation. Respiratory effort normal. Cardiovascular system: S1 & S2 heard, RRR. No murmur. Gastrointestinal system: Abdomen is nondistended, soft and nontender. Normal bowel sounds heard. Central nervous system: Alert. Musculoskeletal: Left leg with wound vac applied.    Data Reviewed: I have personally reviewed following labs and imaging studies  CBC Lab Results  Component Value Date   WBC 10.1 05/29/2024  RBC 2.68 (L) 05/29/2024   HGB 8.4 (L) 05/29/2024   HCT 26.0 (L) 05/29/2024   MCV 97.0 05/29/2024   MCH 31.3  05/29/2024   PLT 437 (H) 05/29/2024   MCHC 32.3 05/29/2024   RDW 15.2 05/29/2024   LYMPHSABS 0.5 (L) 05/20/2024   MONOABS 1.1 (H) 05/20/2024   EOSABS 0.2 05/20/2024   BASOSABS 0.0 05/20/2024     Last metabolic panel Lab Results  Component Value Date   NA 136 05/28/2024   K 4.1 05/28/2024   CL 103 05/28/2024   CO2 24 05/28/2024   BUN 20 05/28/2024   CREATININE 0.76 05/28/2024   GLUCOSE 108 (H) 05/28/2024   GFRNONAA >60 05/28/2024   CALCIUM  8.1 (L) 05/28/2024   PROT 5.6 (L) 05/28/2024   ALBUMIN 2.9 (L) 05/28/2024   BILITOT 0.4 05/28/2024   ALKPHOS 66 05/28/2024   AST 73 (H) 05/28/2024   ALT 90 (H) 05/28/2024   ANIONGAP 9 05/28/2024    GFR: Estimated Creatinine Clearance: 70.6 mL/min (by C-G formula based on SCr of 0.76 mg/dL).  Recent Results (from the past 240 hours)  Blood culture (routine x 2)     Status: None   Collection Time: 05/20/24  3:55 PM   Specimen: BLOOD  Result Value Ref Range Status   Specimen Description BLOOD RIGHT ANTECUBITAL  Final   Special Requests   Final    BOTTLES DRAWN AEROBIC AND ANAEROBIC Blood Culture adequate volume   Culture   Final    NO GROWTH 5 DAYS Performed at [facility], [address] [place], [place] [address]    Report Status 05/25/2024 FINAL  Final  Blood culture (routine x 2)     Status: None   Collection Time: 05/20/24  6:19 PM   Specimen: BLOOD  Result Value Ref Range Status   Specimen Description BLOOD SITE NOT SPECIFIED  Final   Special Requests   Final    BOTTLES DRAWN AEROBIC AND ANAEROBIC Blood Culture adequate volume   Culture   Final    NO GROWTH 5 DAYS Performed at [facility], [address] [place] [address], [place], [place] [address]    Report Status 05/25/2024 FINAL  Final  Aerobic Culture w Gram Stain (superficial specimen)     Status: None   Collection Time: 05/20/24  6:19 PM   Specimen: Wound  Result Value Ref Range Status   Specimen Description WOUND  Final   Special Requests NONE  Final   Gram  Stain NO WBC SEEN NO ORGANISMS SEEN   Final   Culture   Final    NO GROWTH 2 DAYS Performed at [facility], [address], [place], [place] [address]    Report Status 05/25/2024 FINAL  Final  MRSA Next Gen by PCR, Nasal     Status: None   Collection Time: 05/24/24 11:01 AM   Specimen: Nasal Mucosa; Nasal Swab  Result Value Ref Range Status   MRSA by PCR Next Gen NOT DETECTED NOT DETECTED Final    Comment: (NOTE) The GeneXpert MRSA Assay (FDA approved for NASAL specimens only), is one component of a comprehensive MRSA colonization surveillance program. It is not intended to diagnose MRSA infection nor to guide or monitor treatment for MRSA infections. Test performance is not FDA approved in patients less than 36 years old. Performed at [facility], [address] [place], [place] [address]   MRSA Next Gen by PCR, Nasal     Status: None  Collection Time: 05/27/24  4:51 PM   Specimen: Nasal Mucosa; Nasal Swab  Result Value Ref Range Status   MRSA by PCR Next Gen NOT DETECTED NOT DETECTED Final    Comment: (NOTE) The GeneXpert MRSA Assay (FDA approved for NASAL specimens only), is one component of a comprehensive MRSA colonization surveillance program. It is not intended to diagnose MRSA infection nor to guide or monitor treatment for MRSA infections. Test performance is not FDA approved in patients less than 61 years old. Performed at [facility], [address], [place], [place] [address]       Radiology Studies: No results found.    LOS: 9 days    Elgin Lam, MD [facility] 05/29/2024, 1:52 PM   If 7PM-7AM, please contact night-coverage [email or web address]  "

## 2024-05-30 DIAGNOSIS — T148XXA Other injury of unspecified body region, initial encounter: Secondary | ICD-10-CM | POA: Diagnosis not present

## 2024-05-30 DIAGNOSIS — T79A22A Traumatic compartment syndrome of left lower extremity, initial encounter: Secondary | ICD-10-CM | POA: Diagnosis not present

## 2024-05-30 LAB — CBC
HCT: 26.5 % — ABNORMAL LOW (ref 39.0–52.0)
Hemoglobin: 8.7 g/dL — ABNORMAL LOW (ref 13.0–17.0)
MCH: 32.2 pg (ref 26.0–34.0)
MCHC: 32.8 g/dL (ref 30.0–36.0)
MCV: 98.1 fL (ref 80.0–100.0)
Platelets: 462 K/uL — ABNORMAL HIGH (ref 150–400)
RBC: 2.7 MIL/uL — ABNORMAL LOW (ref 4.22–5.81)
RDW: 15.2 % (ref 11.5–15.5)
WBC: 9.7 K/uL (ref 4.0–10.5)
nRBC: 0 % (ref 0.0–0.2)

## 2024-05-30 LAB — HEPARIN LEVEL (UNFRACTIONATED): Heparin Unfractionated: 0.47 [IU]/mL (ref 0.30–0.70)

## 2024-05-30 LAB — PROTIME-INR
INR: 1.1 (ref 0.8–1.2)
Prothrombin Time: 15 s (ref 11.4–15.2)

## 2024-05-30 MED ORDER — WARFARIN SODIUM 7.5 MG PO TABS
7.5000 mg | ORAL_TABLET | Freq: Once | ORAL | Status: AC
  Administered 2024-05-30: 7.5 mg via ORAL
  Filled 2024-05-30: qty 1

## 2024-05-30 MED ORDER — DOCUSATE SODIUM 50 MG PO CAPS
50.0000 mg | ORAL_CAPSULE | Freq: Once | ORAL | Status: AC
  Administered 2024-05-31: 50 mg via ORAL
  Filled 2024-05-30: qty 1

## 2024-05-30 NOTE — Progress Notes (Signed)
 "  PROGRESS NOTE    Terry [name]  FMW:[id number] DOB: 02-06-1936 DOA: 05/20/2024 PCP: Shayne Anes, MD   Brief Narrative: Terry [name] is a 89 y.o. male with a history of mitral valve replacement with mechanical valve, primary hypertension, hyperlipidemia, BPH.  Patient presented secondary to worsening leg swelling related to leg trauma and complicated by Coumadin  use, found to have a hematoma and evidence of compartment syndrome. Orthopedic surgery with recommendation for I&D. Initial debridement on 1/14 with application of a wound vac.   Assessment and Plan:  Left lower extremity hematoma Secondary to injury and complicated by Coumadin  use. Significant symptoms. Empiric antibiotics started. Orthopedic surgery consulted and performed debridement for the hematoma and compartment syndrome on 1/14 and 1/16. Per orthopedic surgery, patient is weight bearing as tolerated with a walker. PT/OT recommending home health services however, after second surgery, orthopedic surgery recommends SNF. Wound cultures with no growth to date. - Orthopedic surgery recommendations: plan for repeat debridement on 1/16 - Continue Ceftriaxone  and linezolid   Primary hypertension - Continue Toprol  XL  History of mitral valve replacement Patient is on Coumadin  as an outpatient. Goal INR of 2.5 to 3.5; no evidence of supratherapeutic INR on admission, but was supratherapeutic around time of his injury. Coumadin  held on admission and started on Heparin  IV bridge. - Continue Heparin  IV bridge; transition to Lovenox  when ready for discharge to complete bridge to Coumadin  - [place] Coumadin  (goal INR 2.5 - 3.5)  Hyperlipidemia Prior to arrival medication(s) include Crestor  which was held secondary to elevated AST/ALT; AST/ALT trending down.  Acute blood loss anemia Chronic normocytic anemia Baseline hemoglobin of about 9-11 but decreased to 7.4 secondary to hematoma. Patient received  a total of 1 unit of  PRBC. Hemoglobin is stable.   DVT prophylaxis: Heparin  IV Code Status:   Code Status: Full Code Family Communication: None at bedside Disposition Plan: Discharge pending ongoing orthopedic surgery recommendations; Orthopedic surgery expects likely need to discharge to SNF. Anticipate likely medically stable for discharge in 24 hours.   Consultants:  Orthopedic surgery  Procedures:  Orthopedic surgery (1/14) Excisional debridement of the hematoma left leg. Compartment release of the anterior lateral compartment left leg with fasciectomy. Tissue sent for cultures. Application of Kerecis micro graft 95 cm to cover wound surface area greater than 200 cm. Application vancomycin  powder 1 g and tobramycin  powder 1.2 g. Application of the cleanse choice wound VAC sponge in the peel and place wound VAC sponge x 2. Orthopedic surgery (1/16) Excisional debridement skin and soft tissue muscle and fascia left leg. Application of Kerecis tissue graft 7 x 10 cm x 4 and application of Kerecis micro graft 95 cm x 2 to cover a wound surface area of 900 cm  Antimicrobials: Ceftriaxone     Subjective: Patient reports no issues this morning. Feels well. No significant pain of left leg.  Objective: BP (!) 124/56 (BP Location: Right Arm)   Pulse 82   Temp (!) 97.5 F (36.4 C) (Oral)   Resp 16   Ht 5' 11 (1.803 m)   Wt 85 kg   SpO2 95%   BMI 26.14 kg/m   Examination:  General exam: Appears calm and comfortable. Respiratory system: Clear to auscultation. Respiratory effort normal. Cardiovascular system: S1 & S2 heard, RRR. No murmur. Gastrointestinal system: Abdomen is nondistended, soft and nontender. Normal bowel sounds heard. Central nervous system: Alert. Musculoskeletal: Left leg with wound vac applied.    Data Reviewed: I have personally reviewed following labs and  imaging studies  CBC Lab Results  Component Value Date   WBC 9.7 05/30/2024   RBC 2.70 (L) 05/30/2024   HGB  8.7 (L) 05/30/2024   HCT 26.5 (L) 05/30/2024   MCV 98.1 05/30/2024   MCH 32.2 05/30/2024   PLT 462 (H) 05/30/2024   MCHC 32.8 05/30/2024   RDW 15.2 05/30/2024   LYMPHSABS 0.5 (L) 05/20/2024   MONOABS 1.1 (H) 05/20/2024   EOSABS 0.2 05/20/2024   BASOSABS 0.0 05/20/2024     Last metabolic panel Lab Results  Component Value Date   NA 136 05/28/2024   K 4.1 05/28/2024   CL 103 05/28/2024   CO2 24 05/28/2024   BUN 20 05/28/2024   CREATININE 0.76 05/28/2024   GLUCOSE 108 (H) 05/28/2024   GFRNONAA >60 05/28/2024   CALCIUM  8.1 (L) 05/28/2024   PROT 5.6 (L) 05/28/2024   ALBUMIN 2.9 (L) 05/28/2024   BILITOT 0.4 05/28/2024   ALKPHOS 66 05/28/2024   AST 73 (H) 05/28/2024   ALT 90 (H) 05/28/2024   ANIONGAP 9 05/28/2024    GFR: Estimated Creatinine Clearance: 70.6 mL/min (by C-G formula based on SCr of 0.76 mg/dL).  Recent Results (from the past 240 hours)  Blood culture (routine x 2)     Status: None   Collection Time: 05/20/24  3:55 PM   Specimen: BLOOD  Result Value Ref Range Status   Specimen Description BLOOD RIGHT ANTECUBITAL  Final   Special Requests   Final    BOTTLES DRAWN AEROBIC AND ANAEROBIC Blood Culture adequate volume   Culture   Final    NO GROWTH 5 DAYS Performed at [facility], [address], [place], [place] [address]    Report Status 05/25/2024 FINAL  Final  Blood culture (routine x 2)     Status: None   Collection Time: 05/20/24  6:19 PM   Specimen: BLOOD  Result Value Ref Range Status   Specimen Description BLOOD SITE NOT SPECIFIED  Final   Special Requests   Final    BOTTLES DRAWN AEROBIC AND ANAEROBIC Blood Culture adequate volume   Culture   Final    NO GROWTH 5 DAYS Performed at [facility], [address], [place], [place] [address]    Report Status 05/25/2024 FINAL  Final  Aerobic Culture w Gram Stain (superficial specimen)     Status: None   Collection Time: 05/20/24  6:19 PM   Specimen: Wound  Result Value Ref Range  Status   Specimen Description WOUND  Final   Special Requests NONE  Final   Gram Stain NO WBC SEEN NO ORGANISMS SEEN   Final   Culture   Final    NO GROWTH 2 DAYS Performed at [facility], [address] [place], [place] [address]    Report Status 05/25/2024 FINAL  Final  MRSA Next Gen by PCR, Nasal     Status: None   Collection Time: 05/24/24 11:01 AM   Specimen: Nasal Mucosa; Nasal Swab  Result Value Ref Range Status   MRSA by PCR Next Gen NOT DETECTED NOT DETECTED Final    Comment: (NOTE) The GeneXpert MRSA Assay (FDA approved for NASAL specimens only), is one component of a comprehensive MRSA colonization surveillance program. It is not intended to diagnose MRSA infection nor to guide or monitor treatment for MRSA infections. Test performance is not FDA approved in patients less than 46 years old. Performed at [facility], [address], [place],  [place] [address]   MRSA Next Gen by PCR, Nasal     Status: None   Collection Time: 05/27/24  4:51 PM   Specimen: Nasal Mucosa; Nasal Swab  Result Value Ref Range Status   MRSA by PCR Next Gen NOT DETECTED NOT DETECTED Final    Comment: (NOTE) The GeneXpert MRSA Assay (FDA approved for NASAL specimens only), is one component of a comprehensive MRSA colonization surveillance program. It is not intended to diagnose MRSA infection nor to guide or monitor treatment for MRSA infections. Test performance is not FDA approved in patients less than 24 years old. Performed at [facility], [address], [place], [place] [address]       Radiology Studies: No results found.    LOS: 10 days    Elgin Lam, MD [facility] 05/30/2024, 8:03 AM   If 7PM-7AM, please contact night-coverage [email or web address]  "

## 2024-05-30 NOTE — Progress Notes (Signed)
 PHARMACY - ANTICOAGULATION CONSULT NOTE  Pharmacy Consult for Heparin  and Warfarin Indication: mech MVR   Patient Measurements: Height: 5' 11 (180.3 cm) Weight: 85 kg (187 lb 6.3 oz) IBW/kg (Calculated) : 75.3 HEPARIN  DW (KG): 85  Vital Signs: Temp: 97.5 F (36.4 C) (01/18 0419) Temp Source: Oral (01/18 0419) BP: 124/56 (01/18 0419) Pulse Rate: 82 (01/18 0419)  Labs: Recent Labs    05/28/24 0521 05/28/24 1504 05/29/24 0339 05/29/24 1550 05/29/24 1654 05/30/24 0343  HGB 8.7*  --  8.4*  --   --  8.7*  HCT 26.9*  --  26.0*  --   --  26.5*  PLT 406*  --  437*  --   --  462*  LABPROT  --   --   --   --   --  15.0  INR  --   --   --   --   --  1.1  HEPARINUNFRC 0.50  --  <0.10* <0.10* 0.43 0.47  CREATININE  --  0.76  --   --   --   --   CKTOTAL  --  126  --   --   --   --     Estimated Creatinine Clearance: 70.6 mL/min (by C-G formula based on SCr of 0.76 mg/dL).  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 Warfarin reversed with vitamin K po 2.5mg  x1  05/20/24  Heparin  level therapeutic at 0.47  Goal of Therapy:  Heparin  level 0.3-0.5 units/ml INR 2.5- 3.5 Monitor platelets by anticoagulation protocol: Yes   Plan:  Decrease heparin  to 1250 units / hr Repeat Warfarin 7.5 mg po x 1 dose today Monitor daily heparin  level, CBC, INR  Thank you. Olam Monte, PharmD  Please check AMION for all [facility] phone numbers After 10:00 PM, call Main Pharmacy [phone]

## 2024-05-30 NOTE — Progress Notes (Signed)
 Mobility Specialist Progress Note:    05/30/24 1108  Mobility  Activity Pivoted/transferred from bed to chair  Level of Assistance Contact guard assist, steadying assist  Assistive Device Front wheel walker  Distance Ambulated (ft) 8 ft  LLE Weight Bearing Per Provider Order WBAT  Activity Response Tolerated well  Mobility Referral Yes  Mobility visit 1 Mobility  Mobility Specialist Start Time (ACUTE ONLY) 1048  Mobility Specialist Stop Time (ACUTE ONLY) 1104  Mobility Specialist Time Calculation (min) (ACUTE ONLY) 16 min   Received pt in bed and eager for OOB mobility. Pt required MinG for safety. No c/o. Left pt in chair with alarm on. Personal belongings and call light within reach.   Lavanda Pollack Mobility Specialist  Please contact via [employer] or  Rehab Office [phone]

## 2024-05-31 ENCOUNTER — Encounter (HOSPITAL_COMMUNITY): Payer: Self-pay | Admitting: Orthopedic Surgery

## 2024-05-31 LAB — CBC
HCT: 26.8 % — ABNORMAL LOW (ref 39.0–52.0)
Hemoglobin: 8.6 g/dL — ABNORMAL LOW (ref 13.0–17.0)
MCH: 32 pg (ref 26.0–34.0)
MCHC: 32.1 g/dL (ref 30.0–36.0)
MCV: 99.6 fL (ref 80.0–100.0)
Platelets: 489 K/uL — ABNORMAL HIGH (ref 150–400)
RBC: 2.69 MIL/uL — ABNORMAL LOW (ref 4.22–5.81)
RDW: 15.3 % (ref 11.5–15.5)
WBC: 8.5 K/uL (ref 4.0–10.5)
nRBC: 0 % (ref 0.0–0.2)

## 2024-05-31 LAB — PROTIME-INR
INR: 1.5 — ABNORMAL HIGH (ref 0.8–1.2)
Prothrombin Time: 18.9 s — ABNORMAL HIGH (ref 11.4–15.2)

## 2024-05-31 LAB — HEPARIN LEVEL (UNFRACTIONATED)
Heparin Unfractionated: 0.56 [IU]/mL (ref 0.30–0.70)
Heparin Unfractionated: 0.43 [IU]/mL (ref 0.30–0.70)

## 2024-05-31 MED ORDER — WARFARIN SODIUM 2.5 MG PO TABS
2.5000 mg | ORAL_TABLET | Freq: Once | ORAL | Status: AC
  Administered 2024-05-31: 2.5 mg via ORAL
  Filled 2024-05-31: qty 1

## 2024-05-31 MED ORDER — CEFADROXIL 500 MG PO CAPS: 500.0000 mg | ORAL_CAPSULE | Freq: Two times a day (BID) | ORAL | Status: DC

## 2024-05-31 MED ORDER — SODIUM CHLORIDE 0.9 % IV BOLUS
500.0000 mL | Freq: Once | INTRAVENOUS | Status: AC
  Administered 2024-05-31: 500 mL via INTRAVENOUS

## 2024-05-31 NOTE — Progress Notes (Signed)
 Pt has no BM for the past 72hrs. No PRN on board.On call provider notified. Ordered colace 50mg  and given. Will continue to monitor.

## 2024-05-31 NOTE — Progress Notes (Signed)
 Physical Therapy Treatment Patient Details Name: Terry [name] MRN: [id number] DOB: October 26, 1935 Today's Date: 05/31/2024   History of Present Illness 89 y.o. male presented 05/20/24 for worsening leg swelling related to leg trauma and complicated by Coumadin  use. Found to have a hematoma and evidence of compartment syndrome. Pt underwent excisional debridement, compartment release of the anterior and lateral compartment of LLE with fasciectomy, and application of wound vac 1/14. Repeat I&D and tissue graft (1/16). PMHx: mitral valve replacement with mechanical valve, primary HTN, HLD, and BPH.    PT Comments   Pt reassessed s/p LLE debridement and application of tissue graft 1/16. Pt premedicated prior to session and received sitting up in chair. He is pleasant and motivated to participate. Pt requiring min-modA for transfers to standing. Maintained static standing for PT to provide posterior peri care. Following seated rest break, attempted to progress gait, however pt reporting dizziness and cold sweat. Once dynamap was obtained, BP measured to be 85/50 (61), HR 98, then re-measured once reclined to be 102/50 (64), HR 94. RN and MD notified. Patient will benefit from intensive inpatient follow-up therapy, >3 hours/day in order to address strengthening, ROM, functional mobility. PTA, pt lives an active lifestyle and volunteers in guest services at [facility].    If plan is discharge home, recommend the following: A little help with walking and/or transfers;A little help with bathing/dressing/bathroom;Assistance with cooking/housework;Assist for transportation;Help with stairs or ramp for entrance   Can travel by private vehicle        Equipment Recommendations  BSC/3in1;Wheelchair (measurements PT);Wheelchair cushion (measurements PT)    Recommendations for Other Services       Precautions / Restrictions Precautions Precautions: Fall Recall of Precautions/Restrictions:  Intact Precaution/Restrictions Comments: Wound Vac LLE, watch orthostatics Restrictions Weight Bearing Restrictions Per Provider Order: Yes LLE Weight Bearing Per Provider Order: Weight bearing as tolerated     Mobility  Bed Mobility               General bed mobility comments: OOB in chair    Transfers Overall transfer level: Needs assistance Equipment used: Rolling walker (2 wheels) Transfers: Sit to/from Stand Sit to Stand: Min assist, Mod assist           General transfer comment: Min-modA to stand from chair, verbal cues for hand placement    Ambulation/Gait Ambulation/Gait assistance: Contact guard assist Gait Distance (Feet): 1 Feet Assistive device: Rolling walker (2 wheels) Gait Pattern/deviations: Step-to pattern, Decreased stance time - left, Decreased weight shift to left, Antalgic       General Gait Details: Verbal cues for sequencing/technique, upright posture   Stairs             Wheelchair Mobility     Tilt Bed    Modified Rankin (Stroke Patients Only)       Balance Overall balance assessment: Needs assistance Sitting-balance support: Feet supported Sitting balance-Leahy Scale: Fair     Standing balance support: Bilateral upper extremity supported, During functional activity, Reliant on assistive device for balance Standing balance-Leahy Scale: Poor Standing balance comment: Dependent on RW and external support                            Communication Communication Communication: Impaired  Cognition Arousal: Alert Behavior During Therapy: WFL for tasks assessed/performed   PT - Cognitive impairments: No apparent impairments  Following commands: Intact      Cueing Cueing Techniques: Verbal cues  Exercises General Exercises - Lower Extremity Ankle Circles/Pumps: PROM, Left, Seated, Other (comment) (1 min hold)    General Comments        Pertinent Vitals/Pain Pain  Assessment Pain Assessment: Faces Faces Pain Scale: Hurts even more Pain Location: LLE Pain Descriptors / Indicators: Grimacing, Discomfort, Aching Pain Intervention(s): Limited activity within patient's tolerance, Monitored during session    Home Living                          Prior Function            PT Goals (current goals can now be found in the care plan section) Acute Rehab PT Goals Patient Stated Goal: Return Home and regain independence Potential to Achieve Goals: Good Progress towards PT goals: Progressing toward goals    Frequency    Min 2X/week      PT Plan      Co-evaluation              AM-PAC PT 6 Clicks Mobility   Outcome Measure  Help needed turning from your back to your side while in a flat bed without using bedrails?: A Little Help needed moving from lying on your back to sitting on the side of a flat bed without using bedrails?: A Little Help needed moving to and from a bed to a chair (including a wheelchair)?: A Little Help needed standing up from a chair using your arms (e.g., wheelchair or bedside chair)?: A Lot Help needed to walk in hospital room?: Total Help needed climbing 3-5 steps with a railing? : Total 6 Click Score: 13    End of Session Equipment Utilized During Treatment: Gait belt Activity Tolerance: Other (comment) (limited due to dizziness) Patient left: in chair;with call bell/phone within reach;with chair alarm set Nurse Communication: Mobility status PT Visit Diagnosis: Difficulty in walking, not elsewhere classified (R26.2);Other abnormalities of gait and mobility (R26.89);Pain Pain - Right/Left: Left Pain - part of body: Leg     Time: 8966-8944 PT Time Calculation (min) (ACUTE ONLY): 22 min  Charges:    $Therapeutic Activity: 8-22 mins PT General Charges $$ ACUTE PT VISIT: 1 Visit                     Terry [name], PT, DPT Acute Rehabilitation Services Office [phone]    Terry [name] 05/31/2024, 11:49 AM

## 2024-05-31 NOTE — Progress Notes (Signed)
 PHARMACY - ANTICOAGULATION CONSULT NOTE  Pharmacy Consult for Heparin  and Warfarin Indication: mech MVR   Patient Measurements: Height: 5' 11 (180.3 cm) Weight: 85 kg (187 lb 6.3 oz) IBW/kg (Calculated) : 75.3 HEPARIN  DW (KG): 85  Vital Signs: Temp: 97.9 F (36.6 C) (01/19 0421) Temp Source: Oral (01/19 0421) BP: 132/67 (01/19 0421) Pulse Rate: 87 (01/19 0421)  Labs: Recent Labs     0000 05/28/24 1504 05/29/24 0339 05/29/24 1550 05/29/24 1654 05/30/24 0343 05/31/24 0428  HGB   < >  --  8.4*  --   --  8.7* 8.6*  HCT  --   --  26.0*  --   --  26.5* 26.8*  PLT  --   --  437*  --   --  462* 489*  LABPROT  --   --   --   --   --  15.0 18.9*  INR  --   --   --   --   --  1.1 1.5*  HEPARINUNFRC  --   --  <0.10*   < > 0.43 0.47 0.56  CREATININE  --  0.76  --   --   --   --   --   CKTOTAL  --  126  --   --   --   --   --    < > = values in this interval not displayed.    Estimated Creatinine Clearance: 70.6 mL/min (by C-G formula based on SCr of 0.76 mg/dL).  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 Warfarin reversed with vitamin K po 2.5mg  x1  05/20/24.  Home warfarin regimen = 2.5 mg Mon, Wed, Fri and 5 mg Tues, Thur, Sat, Sun (weekly dose = 27.5). INR 3.3 on admission on this regimen.   Heparin  level supratherapeutic at 0.56.  INR subtherapeutic at 1.5 (up from 1.1 yesterday, received 2 days of 7.5 mg). INR normally takes 3-4 days to respond, should continue to rise tomorrow.   Goal of Therapy:  Heparin  level 0.3-0.5 units/ml INR 2.5- 3.5 Monitor platelets by anticoagulation protocol: Yes   Plan:  Decrease heparin  to 1150 units / hr, recheck in 8 hours to ensure within goal  Warfarin 2.5 mg po x 1 dose today (for 17.5 mg of total weekly dose already within 3 days of therapy)  Monitor daily heparin  level, CBC, INR  Rankin Sams, PharmD, BCCCP Clinical Pharmacist

## 2024-05-31 NOTE — Progress Notes (Signed)
 "  PROGRESS NOTE    Terry [name]  FMW:[id number] DOB: 01-08-1936 DOA: 05/20/2024 PCP: Shayne Anes, MD   Brief Narrative: Terry [name] is a 89 y.o. male with a history of mitral valve replacement with mechanical valve, primary hypertension, hyperlipidemia, BPH.  Patient presented secondary to worsening leg swelling related to leg trauma and complicated by Coumadin  use, found to have a hematoma and evidence of compartment syndrome. Orthopedic surgery with recommendation for I&D. Initial debridement on 1/14 with application of a wound vac.   Assessment and Plan:  Left lower extremity hematoma Secondary to injury and complicated by Coumadin  use. Significant symptoms. Empiric antibiotics started. Orthopedic surgery consulted and performed debridement for the hematoma and compartment syndrome with evidence of necrosis on 1/14 and 1/16. Per orthopedic surgery, patient is weight bearing as tolerated with a walker. PT/OT recommending home health services however, after second surgery, orthopedic surgery recommends SNF. Wound cultures with no growth to date. - Orthopedic surgery recommendations: wound vac, SNF; pending today - Continue linezolid  - Discontinue ceftriaxone   Primary hypertension - Continue Toprol  XL  History of mitral valve replacement Patient is on Coumadin  as an outpatient. Goal INR of 2.5 to 3.5; no evidence of supratherapeutic INR on admission, but was supratherapeutic around time of his injury. Coumadin  held on admission and started on Heparin  IV bridge. Coumadin  restarted. - Continue Heparin  IV bridge; transition to Lovenox  when ready for discharge to complete bridge to Coumadin  - Contomie Coumadin  (goal INR 2.5 - 3.5)  Hyperlipidemia Prior to arrival medication(s) include Crestor  which was held secondary to elevated AST/ALT; AST/ALT trending down.  Acute blood loss anemia Chronic normocytic anemia Baseline hemoglobin of about 9-11 but decreased to 7.4 secondary to  hematoma. Patient received  a total of 1 unit of PRBC. Hemoglobin is stable.   DVT prophylaxis: Heparin  IV; Coumadin  Code Status:   Code Status: Full Code Family Communication: None at bedside Disposition Plan: Discharge pending ongoing orthopedic surgery recommendations; Orthopedic surgery expects likely need to discharge to SNF. Anticipate likely medically stable for discharge in 24 hours.   Consultants:  Orthopedic surgery  Procedures:  Orthopedic surgery (1/14) Excisional debridement of the hematoma left leg. Compartment release of the anterior lateral compartment left leg with fasciectomy. Tissue sent for cultures. Application of Kerecis micro graft 95 cm to cover wound surface area greater than 200 cm. Application vancomycin  powder 1 g and tobramycin  powder 1.2 g. Application of the cleanse choice wound VAC sponge in the peel and place wound VAC sponge x 2. Orthopedic surgery (1/16) Excisional debridement skin and soft tissue muscle and fascia left leg. Application of Kerecis tissue graft 7 x 10 cm x 4 and application of Kerecis micro graft 95 cm x 2 to cover a wound surface area of 900 cm  Antimicrobials: Ceftriaxone     Subjective: No concerns this morning except his ability to manage at home. He reports significant difficulties yesterday while working with therapy to get to the bedside chair. He lives at home alone.  Objective: BP (!) 105/53 (BP Location: Right Arm)   Pulse 97   Temp 98.2 F (36.8 C)   Resp 16   Ht 5' 11 (1.803 m)   Wt 85 kg   SpO2 98%   BMI 26.14 kg/m   Examination:  General exam: Appears calm and comfortable. Respiratory system: Clear to auscultation. Respiratory effort normal. Cardiovascular system: S1 & S2 heard, RRR. No murmur. Gastrointestinal system: Abdomen is nondistended, soft and nontender. Normal bowel sounds heard. Central  nervous system: Alert. Musculoskeletal: Left leg with wound vac applied.    Data Reviewed: I have  personally reviewed following labs and imaging studies  CBC Lab Results  Component Value Date   WBC 8.5 05/31/2024   RBC 2.69 (L) 05/31/2024   HGB 8.6 (L) 05/31/2024   HCT 26.8 (L) 05/31/2024   MCV 99.6 05/31/2024   MCH 32.0 05/31/2024   PLT 489 (H) 05/31/2024   MCHC 32.1 05/31/2024   RDW 15.3 05/31/2024   LYMPHSABS 0.5 (L) 05/20/2024   MONOABS 1.1 (H) 05/20/2024   EOSABS 0.2 05/20/2024   BASOSABS 0.0 05/20/2024     Last metabolic panel Lab Results  Component Value Date   NA 136 05/28/2024   K 4.1 05/28/2024   CL 103 05/28/2024   CO2 24 05/28/2024   BUN 20 05/28/2024   CREATININE 0.76 05/28/2024   GLUCOSE 108 (H) 05/28/2024   GFRNONAA >60 05/28/2024   CALCIUM  8.1 (L) 05/28/2024   PROT 5.6 (L) 05/28/2024   ALBUMIN 2.9 (L) 05/28/2024   BILITOT 0.4 05/28/2024   ALKPHOS 66 05/28/2024   AST 73 (H) 05/28/2024   ALT 90 (H) 05/28/2024   ANIONGAP 9 05/28/2024    GFR: Estimated Creatinine Clearance: 70.6 mL/min (by C-G formula based on SCr of 0.76 mg/dL).  Recent Results (from the past 240 hours)  MRSA Next Gen by PCR, Nasal     Status: None   Collection Time: 05/24/24 11:01 AM   Specimen: Nasal Mucosa; Nasal Swab  Result Value Ref Range Status   MRSA by PCR Next Gen NOT DETECTED NOT DETECTED Final    Comment: (NOTE) The GeneXpert MRSA Assay (FDA approved for NASAL specimens only), is one component of a comprehensive MRSA colonization surveillance program. It is not intended to diagnose MRSA infection nor to guide or monitor treatment for MRSA infections. Test performance is not FDA approved in patients less than 37 years old. Performed at [facility], [address], [place], [place] [address]   MRSA Next Gen by PCR, Nasal     Status: None   Collection Time: 05/27/24  4:51 PM   Specimen: Nasal Mucosa; Nasal Swab  Result Value Ref Range Status   MRSA by PCR Next Gen NOT DETECTED NOT DETECTED Final    Comment: (NOTE) The GeneXpert MRSA Assay (FDA approved  for NASAL specimens only), is one component of a comprehensive MRSA colonization surveillance program. It is not intended to diagnose MRSA infection nor to guide or monitor treatment for MRSA infections. Test performance is not FDA approved in patients less than 90 years old. Performed at [facility], [address] [place], [place] [address]       Radiology Studies: No results found.    LOS: 11 days    Elgin Lam, MD [facility] 05/31/2024, 10:40 AM   If 7PM-7AM, please contact night-coverage [email or web address]  "

## 2024-05-31 NOTE — Progress Notes (Signed)
 PHARMACY - ANTICOAGULATION CONSULT NOTE  Pharmacy Consult for Heparin  Indication: mech MVR   Patient Measurements: Height: 5' 11 (180.3 cm) Weight: 85 kg (187 lb 6.3 oz) IBW/kg (Calculated) : 75.3 HEPARIN  DW (KG): 85  Vital Signs: Temp: 98.2 F (36.8 C) (01/19 1017) BP: 105/53 (01/19 1017) Pulse Rate: 97 (01/19 1017)  Labs: Recent Labs    05/29/24 0339 05/29/24 1550 05/30/24 0343 05/31/24 0428 05/31/24 1545  HGB 8.4*  --  8.7* 8.6*  --   HCT 26.0*  --  26.5* 26.8*  --   PLT 437*  --  462* 489*  --   LABPROT  --   --  15.0 18.9*  --   INR  --   --  1.1 1.5*  --   HEPARINUNFRC <0.10*   < > 0.47 0.56 0.43   < > = values in this interval not displayed.    Estimated Creatinine Clearance: 70.6 mL/min (by C-G formula based on SCr of 0.76 mg/dL).  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 Warfarin reversed with vitamin K po 2.5mg  x1  05/20/24  PM Heparin  level therapeutic at 0.43  Goal of Therapy:  Heparin  level 0.3-0.5 units/ml INR 2.5- 3.5 Monitor platelets by anticoagulation protocol: Yes   Plan:  Continue heparin  at 1150 units / hr Monitor daily heparin  level, CBC, INR  Thank you. Olam Monte, PharmD  Please check AMION for all [facility] phone numbers After 10:00 PM, call Main Pharmacy [phone]

## 2024-05-31 NOTE — Progress Notes (Signed)
 Mobility Specialist Progress Note:    05/31/24 0950  Mobility  Activity Pivoted/transferred from bed to chair  Level of Assistance Contact guard assist, steadying assist  Assistive Device Front wheel walker  Distance Ambulated (ft) 8 ft  LLE Weight Bearing Per Provider Order WBAT  Activity Response Tolerated well  Mobility Referral Yes  Mobility visit 1 Mobility  Mobility Specialist Start Time (ACUTE ONLY) 0934  Mobility Specialist Stop Time (ACUTE ONLY) 0950  Mobility Specialist Time Calculation (min) (ACUTE ONLY) 16 min   Received pt in bed and eager for OOB mobility. Pt required MinG for safety. No c/o. Left pt in chair with alarm on. Personal belongings and call light within reach.   Lavanda Pollack Mobility Specialist  Please contact via [employer] or  Rehab Office [phone]

## 2024-05-31 NOTE — Progress Notes (Signed)
 Inpatient Rehab Admissions Coordinator Note:  Per PT recommendations patient was screened for CIR candidacy by Reche FORBES Lowers, PT. At this time, pt does not appear to demonstrate medical necessity to support a CIR admission.  We will not pursue a rehab consult at this time.  Recommend other rehab venues to be pursued.  Please contact me with any questions.  Gagandeep Kossman E Martie Muhlbauer, [place] [phone] 05/31/24 4:39 PM

## 2024-06-01 ENCOUNTER — Ambulatory Visit

## 2024-06-01 LAB — COMPREHENSIVE METABOLIC PANEL WITH GFR
ALT: 33 U/L (ref 0–44)
AST: 28 U/L (ref 15–41)
Albumin: 2.9 g/dL — ABNORMAL LOW (ref 3.5–5.0)
Alkaline Phosphatase: 57 U/L (ref 38–126)
Anion gap: 8 (ref 5–15)
BUN: 17 mg/dL (ref 8–23)
CO2: 23 mmol/L (ref 22–32)
Calcium: 8.1 mg/dL — ABNORMAL LOW (ref 8.9–10.3)
Chloride: 105 mmol/L (ref 98–111)
Creatinine, Ser: 0.84 mg/dL (ref 0.61–1.24)
GFR, Estimated: >60 mL/min
Glucose, Bld: 104 mg/dL — ABNORMAL HIGH (ref 70–99)
Potassium: 4 mmol/L (ref 3.5–5.1)
Sodium: 136 mmol/L (ref 135–145)
Total Bilirubin: 0.4 mg/dL (ref 0.0–1.2)
Total Protein: 5.5 g/dL — ABNORMAL LOW (ref 6.5–8.1)

## 2024-06-01 LAB — CBC
HCT: 25.1 % — ABNORMAL LOW (ref 39.0–52.0)
Hemoglobin: 8.1 g/dL — ABNORMAL LOW (ref 13.0–17.0)
MCH: 32.3 pg (ref 26.0–34.0)
MCHC: 32.3 g/dL (ref 30.0–36.0)
MCV: 100 fL (ref 80.0–100.0)
Platelets: 466 K/uL — ABNORMAL HIGH (ref 150–400)
RBC: 2.51 MIL/uL — ABNORMAL LOW (ref 4.22–5.81)
RDW: 16 % — ABNORMAL HIGH (ref 11.5–15.5)
WBC: 9.4 K/uL (ref 4.0–10.5)
nRBC: 0 % (ref 0.0–0.2)

## 2024-06-01 LAB — TYPE AND SCREEN
ABO/RH(D): A POS
Antibody Screen: NEGATIVE

## 2024-06-01 LAB — HEMOGLOBIN AND HEMATOCRIT, BLOOD
HCT: 23.8 % — ABNORMAL LOW (ref 39.0–52.0)
HCT: 24.3 % — ABNORMAL LOW (ref 39.0–52.0)
Hemoglobin: 7.5 g/dL — ABNORMAL LOW (ref 13.0–17.0)
Hemoglobin: 7.6 g/dL — ABNORMAL LOW (ref 13.0–17.0)

## 2024-06-01 LAB — PROTIME-INR
INR: 1.8 — ABNORMAL HIGH (ref 0.8–1.2)
Prothrombin Time: 21.8 s — ABNORMAL HIGH (ref 11.4–15.2)

## 2024-06-01 LAB — HEPARIN LEVEL (UNFRACTIONATED): Heparin Unfractionated: 0.29 [IU]/mL — ABNORMAL LOW (ref 0.30–0.70)

## 2024-06-01 MED ORDER — SODIUM CHLORIDE 0.9 % IV BOLUS
500.0000 mL | Freq: Once | INTRAVENOUS | Status: AC
  Administered 2024-06-01: 500 mL via INTRAVENOUS

## 2024-06-01 MED ORDER — WARFARIN SODIUM 5 MG PO TABS
5.0000 mg | ORAL_TABLET | Freq: Once | ORAL | Status: AC
  Administered 2024-06-01: 5 mg via ORAL
  Filled 2024-06-01: qty 1

## 2024-06-01 NOTE — Progress Notes (Signed)
" °   06/01/24 0850  Vitals  BP (!) 118/55  BP Location Left Arm  BP Method Automatic  Patient Position (if appropriate) Lying  Pulse Rate 85  Pulse Rate Source Dinamap  Resp 17  Level of Consciousness  Level of Consciousness Alert  MEWS COLOR  MEWS Score Color Green  Oxygen Therapy  SpO2 99 %  O2 Device Room Air  Pain Assessment  Pain Scale 0-10  Pain Score 0  MEWS Score  MEWS Temp 0  MEWS Systolic 0  MEWS Pulse 0  MEWS RR 0  MEWS LOC 0  MEWS Score 0    "

## 2024-06-01 NOTE — Progress Notes (Signed)
 pt went on walk with mobility tech and became very weak and passed out in chair. Pt came back arousal an started to make jokes nand laugh stating he became very weak. Took his BP and notified Nettey MD. New orders placed for [facility].

## 2024-06-01 NOTE — Progress Notes (Signed)
 Dressing change order: At time of discharge remove the wound VAC dressing and apply a dressing with 4 x 4 dampened with Vashe secured with an Ace wrap

## 2024-06-01 NOTE — Progress Notes (Addendum)
 PHARMACY - ANTICOAGULATION CONSULT NOTE  Pharmacy Consult for Heparin  and Warfarin Indication: mech MVR   Patient Measurements: Height: 5' 11 (180.3 cm) Weight: 85 kg (187 lb 6.3 oz) IBW/kg (Calculated) : 75.3 HEPARIN  DW (KG): 85  Vital Signs: Temp: 97.4 F (36.3 C) (01/20 0457) Temp Source: Oral (01/20 0457) BP: 121/51 (01/20 0457) Pulse Rate: 88 (01/20 0457)  Labs: Recent Labs    05/30/24 0343 05/31/24 0428 05/31/24 1545 06/01/24 0505  HGB 8.7* 8.6*  --  8.1*  HCT 26.5* 26.8*  --  25.1*  PLT 462* 489*  --  466*  LABPROT 15.0 18.9*  --  21.8*  INR 1.1 1.5*  --  1.8*  HEPARINUNFRC 0.47 0.56 0.43 0.29*  CREATININE  --   --   --  0.84    Estimated Creatinine Clearance: 67.2 mL/min (by C-G formula based on SCr of 0.84 mg/dL).  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 Warfarin reversed with vitamin K po 2.5mg  x1  05/20/24.  Home warfarin regimen = 2.5 mg Mon, Wed, Fri and 5 mg Tues, Thur, Sat, Sun (weekly dose = 27.5). INR 3.3 on admission on this regimen.   Heparin  level slightly subtherapeutic at 0.29.  INR subtherapeutic at 1.8 (up from 1.5 yesterday, received 2 days of 7.5 mg, one day of 2.5 mg). INR normally takes 3-4 days to respond, should continue to rise.   Goal of Therapy:  Heparin  level 0.3-0.5 units/ml INR 2.5- 3.5 Monitor platelets by anticoagulation protocol: Yes   Plan:  Small increase in heparin  to 1200 units / hr Warfarin 5 mg po x 1 dose today (for 22.5 mg of total weekly dose already within 4 days of therapy)  Monitor daily heparin  level, CBC, INR When ready for discharge, okay to resume home regimen   Rankin Sams, PharmD, BCCCP Clinical Pharmacist

## 2024-06-01 NOTE — Progress Notes (Addendum)
 "  PROGRESS NOTE    Terry [name]  FMW:[id number] DOB: 1936/03/14 DOA: 05/20/2024 PCP: Terry Anes, MD   Brief Narrative: Terry [name] is a 89 y.o. male with a history of mitral valve replacement with mechanical valve, primary hypertension, hyperlipidemia, BPH.  Patient presented secondary to worsening leg swelling related to leg trauma and complicated by Coumadin  use, found to have a hematoma and evidence of compartment syndrome. Orthopedic surgery with recommendation for I&D. Initial debridement on 1/14 with application of a wound vac.   Assessment and Plan:  Left lower extremity hematoma Secondary to injury and complicated by Coumadin  use. Significant symptoms. Empiric antibiotics started. Orthopedic surgery consulted and performed debridement for the hematoma and compartment syndrome with evidence of necrosis on 1/14 and 1/16. Per orthopedic surgery, patient is weight bearing as tolerated with a walker. PT/OT recommending home health services however, after second surgery, orthopedic surgery recommends SNF. Wound cultures with no growth to date. - Orthopedic surgery recommendations: continue wound vac with plan to transition to Vashe dressing changes at time of discharge  Primary hypertension - Continue Toprol  XL  History of mitral valve replacement Patient is on Coumadin  as an outpatient. Goal INR of 2.5 to 3.5; no evidence of supratherapeutic INR on admission, but was supratherapeutic around time of his injury. Coumadin  held on admission and started on Heparin  IV bridge. Coumadin  restarted. INR of 1.8 today. - Continue Heparin  IV bridge; transition to Lovenox  when ready for discharge to complete bridge to Coumadin  - Contomie Coumadin  (goal INR 2.5 - 3.5)  Hyperlipidemia Prior to arrival medication(s) include Crestor  which was held secondary to elevated AST/ALT; AST/ALT trending down.  Acute blood loss anemia Chronic normocytic anemia Baseline hemoglobin of about 9-11 but  decreased to 7.4 secondary to hematoma. Patient received  a total of 1 unit of PRBC. Hemoglobin is stable.   DVT prophylaxis: Heparin  IV; Coumadin  Code Status:   Code Status: Full Code Family Communication: None at bedside Disposition Plan: Discharge to SNF pending bed availability/insurance authorization. Medically stable for discharge if able to transition to Lovenox  on discharge to continue bridge   Consultants:  Orthopedic surgery  Procedures:  Orthopedic surgery (1/14) Excisional debridement of the hematoma left leg. Compartment release of the anterior lateral compartment left leg with fasciectomy. Tissue sent for cultures. Application of Kerecis micro graft 95 cm to cover wound surface area greater than 200 cm. Application vancomycin  powder 1 g and tobramycin  powder 1.2 g. Application of the cleanse choice wound VAC sponge in the peel and place wound VAC sponge x 2. Orthopedic surgery (1/16) Excisional debridement skin and soft tissue muscle and fascia left leg. Application of Kerecis tissue graft 7 x 10 cm x 4 and application of Kerecis micro graft 95 cm x 2 to cover a wound surface area of 900 cm  Antimicrobials: Ceftriaxone     Subjective: No concerns today. Mentioned that he saw Dr. Harden today. No questions.  Objective: BP (!) 118/55   Pulse 85   Temp (!) 97.4 F (36.3 C) (Oral)   Resp 17   Ht 5' 11 (1.803 m)   Wt 85 kg   SpO2 99%   BMI 26.14 kg/m   Examination:  General exam: Appears calm and comfortable. Respiratory system: Clear to auscultation. Respiratory effort normal. Cardiovascular system: S1 & S2 heard, RRR. No murmur. Mechanical click. Gastrointestinal system: Abdomen is nondistended, soft and nontender. Normal bowel sounds heard. Central nervous system: Alert. Musculoskeletal: Left leg with wound vac applied.  Data Reviewed: I have personally reviewed following labs and imaging studies  CBC Lab Results  Component Value Date   WBC  9.4 06/01/2024   RBC 2.51 (L) 06/01/2024   HGB 8.1 (L) 06/01/2024   HCT 25.1 (L) 06/01/2024   MCV 100.0 06/01/2024   MCH 32.3 06/01/2024   PLT 466 (H) 06/01/2024   MCHC 32.3 06/01/2024   RDW 16.0 (H) 06/01/2024   LYMPHSABS 0.5 (L) 05/20/2024   MONOABS 1.1 (H) 05/20/2024   EOSABS 0.2 05/20/2024   BASOSABS 0.0 05/20/2024     Last metabolic panel Lab Results  Component Value Date   NA 136 06/01/2024   K 4.0 06/01/2024   CL 105 06/01/2024   CO2 23 06/01/2024   BUN 17 06/01/2024   CREATININE 0.84 06/01/2024   GLUCOSE 104 (H) 06/01/2024   GFRNONAA >60 06/01/2024   CALCIUM  8.1 (L) 06/01/2024   PROT 5.5 (L) 06/01/2024   ALBUMIN 2.9 (L) 06/01/2024   BILITOT 0.4 06/01/2024   ALKPHOS 57 06/01/2024   AST 28 06/01/2024   ALT 33 06/01/2024   ANIONGAP 8 06/01/2024    GFR: Estimated Creatinine Clearance: 67.2 mL/min (by C-G formula based on SCr of 0.84 mg/dL).  Recent Results (from the past 240 hours)  MRSA Next Gen by PCR, Nasal     Status: None   Collection Time: 05/24/24 11:01 AM   Specimen: Nasal Mucosa; Nasal Swab  Result Value Ref Range Status   MRSA by PCR Next Gen NOT DETECTED NOT DETECTED Final    Comment: (NOTE) The GeneXpert MRSA Assay (FDA approved for NASAL specimens only), is one component of a comprehensive MRSA colonization surveillance program. It is not intended to diagnose MRSA infection nor to guide or monitor treatment for MRSA infections. Test performance is not FDA approved in patients less than 87 years old. Performed at [facility], [address] [place], [place] [address]   MRSA Next Gen by PCR, Nasal     Status: None   Collection Time: 05/27/24  4:51 PM   Specimen: Nasal Mucosa; Nasal Swab  Result Value Ref Range Status   MRSA by PCR Next Gen NOT DETECTED NOT DETECTED Final    Comment: (NOTE) The GeneXpert MRSA Assay (FDA approved for NASAL specimens only), is one component of a comprehensive MRSA colonization surveillance program. It  is not intended to diagnose MRSA infection nor to guide or monitor treatment for MRSA infections. Test performance is not FDA approved in patients less than 54 years old. Performed at [facility], [address], [place], [place] [address]       Radiology Studies: No results found.    LOS: 12 days    Elgin Lam, MD [facility] 06/01/2024, 10:28 AM   If 7PM-7AM, please contact night-coverage [email or web address]  "

## 2024-06-01 NOTE — Progress Notes (Addendum)
 Mobility Specialist: Progress Note   06/01/24 1500  Mobility  Activity Ambulated with assistance  Level of Assistance Contact guard assist, steadying assist  Assistive Device Front wheel walker  Distance Ambulated (ft) 8 ft  LLE Weight Bearing Per Provider Order WBAT  Activity Response RN notified;Tolerated poorly  Mobility Referral Yes  Mobility visit 1 Mobility  Mobility Specialist Start Time (ACUTE ONLY) 1335  Mobility Specialist Stop Time (ACUTE ONLY) 1354  Mobility Specialist Time Calculation (min) (ACUTE ONLY) 19 min    Pt received in chair, very pleasant and agreeable to mobility session. MinA for STS from chair with good hand placement. Ambulated 8' w/ CGA and a chair follow. Started to feel dizzy and needed to sit. Once back in the chair, pt became very lethargic and eventually stopped responding to verbal or physical cues and passed out. Emergency button pressed and staff entered. Pt aroused and alert, making jokes and stating that he just got really weak and ran out of energy. BP 86/49. Completed step pivot back to bed with minA+2 and returned to supine. Left in bed with all needs met, call bell in reach. RN aware.   Ileana Lute Mobility Specialist Please contact via [employer] or Rehab office at [phone]

## 2024-06-01 NOTE — [place] FL2 (Signed)
 "   MEDICAID [id number] LEVEL OF CARE FORM     IDENTIFICATION  Patient Name: Terry [name] Birthdate: Dec 04, 1935 Sex: male Admission Date (Current Location): 05/20/2024  [facility] and [place] Number:  Producer, Television/film/video and Address:  [facility] [place], [address] [place], [place] [address]      Provider Number: [id number]  Attending Physician Name and Address:  Briana Elgin LABOR, MD  Relative Name and Phone Number:  Marval (Niece)  [phone]    Current Level of Care:   Recommended Level of Care: Skilled Nursing Facility Prior Approval Number:    Date Approved/Denied:   PASRR Number: [id number]  Discharge Plan: SNF    Current Diagnoses: Patient Active Problem List   Diagnosis Date Noted   Nontraumatic compartment syndrome of left lower extremity 05/26/2024   Hematoma of left lower extremity 05/24/2024   Acute blood loss anemia 12/24/2021   Essential hypertension 12/24/2021   Hyperlipidemia 12/24/2021   Fall 12/08/2021   Multiple abrasions 12/08/2021   Hematoma 12/08/2021   Syncope 12/07/2021   Encounter for therapeutic drug monitoring 05/09/2017   Chronic anticoagulation 03/01/2016   S/P mitral valve replacement 11/09/2010   H/O mitral valve replacement with mechanical valve 08/03/2010    Orientation RESPIRATION BLADDER Height & Weight     Self, Time, Situation, Place  Normal Continent Weight: 187 lb 6.3 oz (85 kg) Height:  5' 11 (180.3 cm)  BEHAVIORAL SYMPTOMS/MOOD NEUROLOGICAL BOWEL NUTRITION STATUS      Continent Diet (See DC Summary)  AMBULATORY STATUS COMMUNICATION OF NEEDS Skin   Limited Assist Verbally Other (Comment) (wound on left pretibia; closed surgical incision on left leg)                       Personal Care Assistance Level of Assistance  Bathing, Dressing, Feeding Bathing Assistance: Limited assistance Feeding assistance: Independent Dressing Assistance: Limited assistance     Functional Limitations Info   Sight, Hearing, Speech Sight Info: Impaired Hearing Info: Impaired Speech Info: Adequate    SPECIAL CARE FACTORS FREQUENCY  PT (By licensed PT), OT (By licensed OT)     PT Frequency: 5x/week OT Frequency: 5x/week            Contractures Contractures Info: Not present    Additional Factors Info  Code Status, Allergies Code Status Info: Full Allergies Info: Alendronate; Aspirin; Cipro (Ciprofloxacin Hcl); Sulfa drugs cross reactors           Current Medications (06/01/2024):  This is the current hospital active medication list Current Facility-Administered Medications  Medication Dose Route Frequency Provider Last Rate Last Admin   acetaminophen  (TYLENOL ) tablet 1,000 mg  1,000 mg Oral Q8H Collins, Emma M, PA-C   1,000 mg at 06/01/24 9483   dorzolamide -timolol  (COSOPT ) 2-0.5 % ophthalmic solution 1 drop  1 drop Both Eyes BID Gerome Herring M, PA-C   1 drop at 06/01/24 9144   heparin  ADULT infusion 100 units/mL (25000 units/250mL)  1,200 Units/hr Intravenous Continuous Briana Elgin LABOR, MD 12 mL/hr at 06/01/24 0732 1,200 Units/hr at 06/01/24 0732   lidocaine  (LIDODERM ) 5 % 1 patch  1 patch Transdermal Q24H Gerome Herring HERO, PA-C   1 patch at 05/28/24 1641   linezolid  (ZYVOX ) tablet 600 mg  600 mg Oral Q12H Gerome Herring M, PA-C   600 mg at 06/01/24 9144   metoprolol  succinate (TOPROL -XL) 24 hr tablet 50 mg  50 mg Oral Daily Gerome Herring M, PA-C   50  mg at 06/01/24 0855   morphine  (PF) 2 MG/ML injection 2 mg  2 mg Intravenous Q3H PRN Gerome Maurilio HERO, PA-C   2 mg at 05/28/24 1442   ondansetron  (ZOFRAN ) tablet 4 mg  4 mg Oral Q6H PRN Gerome Maurilio HERO, PA-C       Or   ondansetron  (ZOFRAN ) injection 4 mg  4 mg Intravenous Q6H PRN Gerome Maurilio M, PA-C   4 mg at 05/28/24 1247   Oral care mouth rinse  15 mL Mouth Rinse Once Gerome Maurilio M, PA-C       oxyCODONE  (Oxy IR/ROXICODONE ) immediate release tablet 2.5 mg  2.5 mg Oral Q4H PRN Gerome Maurilio M, PA-C       oxyCODONE  (Oxy  IR/ROXICODONE ) immediate release tablet 5 mg  5 mg Oral Q4H PRN Gerome Maurilio M, PA-C   5 mg at 06/01/24 9063   pantoprazole  (PROTONIX ) EC tablet 40 mg  40 mg Oral QODAY Collins, Emma M, PA-C   40 mg at 06/01/24 9144   Tafluprost  (PF) 0.0015 % SOLN 1 drop  1 drop Both Eyes QHS Gerome Maurilio M, PA-C   1 drop at 05/31/24 2155   warfarin (COUMADIN ) tablet 5 mg  5 mg Oral ONCE-1600 Briana Elgin LABOR, MD       Warfarin - Pharmacist Dosing Inpatient   Does not apply q1600 Perri Olam [facility]   Given at 05/30/24 1611     Discharge Medications: Please see discharge summary for a list of discharge medications.  Relevant Imaging Results:  Relevant Lab Results:   Additional Information SSN: [id number]  [name] Moose, LCSWA     "

## 2024-06-01 NOTE — Progress Notes (Signed)
 sodium chloride  0.9 % bolus 500 mL  Started

## 2024-06-01 NOTE — Progress Notes (Signed)
 Bolus complete    06/01/24 1441  Vitals  BP (!) 122/48  BP Location Left Arm  BP Method Automatic  Patient Position (if appropriate) Lying  Pulse Rate 80  Pulse Rate Source Dinamap  Level of Consciousness  Level of Consciousness Alert  MEWS COLOR  MEWS Score Color Green  Oxygen Therapy  SpO2 100 %  O2 Device Room Air  Pain Assessment  Pain Scale 0-10  Pain Score 0  MEWS Score  MEWS Temp 0  MEWS Systolic 0  MEWS Pulse 0  MEWS RR 0  MEWS LOC 0  MEWS Score 0

## 2024-06-01 NOTE — Progress Notes (Signed)
" °   06/01/24 1345  Vitals  Temp 97.8 F (36.6 C)  BP (!) 86/49  MAP (mmHg) (!) 61  Pulse Rate 80  MEWS COLOR  MEWS Score Color Green  Oxygen Therapy  SpO2 100 %  O2 Device Room Air  MEWS Score  MEWS Temp 0  MEWS Systolic 1  MEWS Pulse 0  MEWS RR 0  MEWS LOC 0  MEWS Score 1    "

## 2024-06-01 NOTE — TOC Progression Note (Signed)
 Transition of Care [facility]) - Progression Note    Patient Details  Name: Terry [name] MRN: [id number] Date of Birth: August 01, 1935  Transition of [facility] CM/SW Contact  Samari Bittinger LITTIE Moose, [place] Phone Number: 06/01/2024, 12:06 PM  Clinical Narrative:    CSW received a message from PT, pt not a candidate for CIR. Pt currently Ox4- CSW spoke with pt at bedside regarding SNF following hospital DC. Pt is agreeable and states he has been to [facility] in the past. CSW completed Fl2 and sent out SNF referrals. CSW will follow up to provide bed offers.   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               Expected Discharge Plan and Services   Discharge Planning Services: CM Consult Post Acute Care Choice: NA Living arrangements for the past 2 months: Single Family Home                 DME Arranged:  (Await recommendations)         HH Arranged: NA           Social Drivers of Health (SDOH) Interventions SDOH Screenings   Food Insecurity: No Food Insecurity (05/20/2024)  Housing: Low Risk (05/20/2024)  Transportation Needs: No Transportation Needs (05/20/2024)  Utilities: Not At Risk (05/20/2024)  Social Connections: Socially Isolated (05/20/2024)  Tobacco Use: Medium Risk (05/28/2024)    Readmission Risk Interventions     No data to display

## 2024-06-01 NOTE — Progress Notes (Signed)
 Physical Therapy Treatment Patient Details Name: Terry [name] MRN: [id number] DOB: Sep 15, 1935 Today's Date: 06/01/2024   History of Present Illness 89 y.o. male presented 05/20/24 for worsening leg swelling related to leg trauma and complicated by Coumadin  use. Found to have a hematoma and evidence of compartment syndrome. Pt underwent excisional debridement, compartment release of the anterior and lateral compartment of LLE with fasciectomy, and application of wound vac 1/14. Repeat I&D and tissue graft (1/16). PMHx: mitral valve replacement with mechanical valve, primary HTN, HLD, and BPH.    PT Comments  Pt pleasant and motivated to participate today. Pt BP remained stable throughout session today; pt reporting mild dizziness sitting in chair post mobility. Pt with stool incontinence upon entry; able to tolerate static standing for PT to provide posterior peri care. Pt requiring minA for transfers and taking pivotal steps over to chair with RW and CGA. Patient will benefit from continued inpatient follow up therapy, <3 hours/day to address deficits and maximize functional mobility.  Vitals: BP supine: 115/64 (79), HR 79 BP sitting: 97/53 (66), HR 85 BP standing: failed to measure BP sitting in chair post mobility: 107/54 (69), HR 85    If plan is discharge home, recommend the following: A little help with walking and/or transfers;A little help with bathing/dressing/bathroom;Assistance with cooking/housework;Assist for transportation;Help with stairs or ramp for entrance   Can travel by private vehicle     Yes  Equipment Recommendations  BSC/3in1;Wheelchair (measurements PT);Wheelchair cushion (measurements PT)    Recommendations for Other Services       Precautions / Restrictions Precautions Precautions: Fall Recall of Precautions/Restrictions: Intact Precaution/Restrictions Comments: Wound Vac LLE, watch orthostatics Restrictions Weight Bearing Restrictions Per Provider  Order: Yes LLE Weight Bearing Per Provider Order: Weight bearing as tolerated     Mobility  Bed Mobility Overal bed mobility: Needs Assistance Bed Mobility: Supine to Sit     Supine to sit: Supervision     General bed mobility comments: Increased time, no physical assist    Transfers Overall transfer level: Needs assistance Equipment used: Rolling walker (2 wheels) Transfers: Sit to/from Stand Sit to Stand: Min assist           General transfer comment: MinA to power up to stand and steady from edge of bed    Ambulation/Gait Ambulation/Gait assistance: Contact guard assist Gait Distance (Feet): 3 Feet Assistive device: Rolling walker (2 wheels) Gait Pattern/deviations: Step-to pattern, Decreased stance time - left, Decreased weight shift to left, Antalgic       General Gait Details: Pivotal steps from bed to chair   Stairs             Wheelchair Mobility     Tilt Bed    Modified Rankin (Stroke Patients Only)       Balance Overall balance assessment: Needs assistance Sitting-balance support: Feet supported Sitting balance-Leahy Scale: Fair     Standing balance support: Bilateral upper extremity supported, During functional activity, Reliant on assistive device for balance Standing balance-Leahy Scale: Poor Standing balance comment: Dependent on RW and external support                            Communication Communication Communication: Impaired  Cognition Arousal: Alert Behavior During Therapy: WFL for tasks assessed/performed   PT - Cognitive impairments: Initiation, Problem solving, Attention  Following commands: Intact      Cueing Cueing Techniques: Verbal cues  Exercises General Exercises - Lower Extremity Quad Sets: AROM, Both, 10 reps, Supine    General Comments        Pertinent Vitals/Pain Pain Assessment Pain Assessment: Faces Faces Pain Scale: Hurts little more Pain  Location: LLE Pain Descriptors / Indicators: Grimacing, Discomfort, Aching Pain Intervention(s): Limited activity within patient's tolerance, Premedicated before session, Monitored during session    Home Living                          Prior Function            PT Goals (current goals can now be found in the care plan section) Acute Rehab PT Goals Patient Stated Goal: Return Home and regain independence Potential to Achieve Goals: Good Progress towards PT goals: Progressing toward goals    Frequency    Min 2X/week      PT Plan      Co-evaluation              AM-PAC PT 6 Clicks Mobility   Outcome Measure  Help needed turning from your back to your side while in a flat bed without using bedrails?: A Little Help needed moving from lying on your back to sitting on the side of a flat bed without using bedrails?: A Little Help needed moving to and from a bed to a chair (including a wheelchair)?: A Little Help needed standing up from a chair using your arms (e.g., wheelchair or bedside chair)?: A Little Help needed to walk in hospital room?: Total Help needed climbing 3-5 steps with a railing? : Total 6 Click Score: 14    End of Session Equipment Utilized During Treatment: Gait belt Activity Tolerance: Patient tolerated treatment well Patient left: in chair;with call bell/phone within reach;with chair alarm set Nurse Communication: Mobility status PT Visit Diagnosis: Difficulty in walking, not elsewhere classified (R26.2);Other abnormalities of gait and mobility (R26.89);Pain Pain - Right/Left: Left Pain - part of body: Leg     Time: 8944-8873 PT Time Calculation (min) (ACUTE ONLY): 31 min  Charges:    $Therapeutic Activity: 23-37 mins PT General Charges $$ ACUTE PT VISIT: 1 Visit                     Aleck Daring, PT, DPT Acute Rehabilitation Services Office [phone]    Aleck ONEIDA Daring 06/01/2024, 12:31 PM

## 2024-06-01 NOTE — Progress Notes (Signed)
 Patient ID: Terry [name], male   DOB: 1935-09-20, 89 y.o.   MRN: [id number] There is 150 cc in the wound VAC canister.  Plan to transition to Vashe dressing changes at time of discharge.

## 2024-06-01 NOTE — Plan of Care (Signed)

## 2024-06-02 DIAGNOSIS — T148XXA Other injury of unspecified body region, initial encounter: Secondary | ICD-10-CM | POA: Diagnosis not present

## 2024-06-02 LAB — CBC
HCT: 24.2 % — ABNORMAL LOW (ref 39.0–52.0)
Hemoglobin: 7.9 g/dL — ABNORMAL LOW (ref 13.0–17.0)
MCH: 33.1 pg (ref 26.0–34.0)
MCHC: 32.6 g/dL (ref 30.0–36.0)
MCV: 101.3 fL — ABNORMAL HIGH (ref 80.0–100.0)
Platelets: 441 K/uL — ABNORMAL HIGH (ref 150–400)
RBC: 2.39 MIL/uL — ABNORMAL LOW (ref 4.22–5.81)
RDW: 16.3 % — ABNORMAL HIGH (ref 11.5–15.5)
WBC: 8.7 K/uL (ref 4.0–10.5)
nRBC: 0 % (ref 0.0–0.2)

## 2024-06-02 LAB — PROTIME-INR
INR: 2.1 — ABNORMAL HIGH (ref 0.8–1.2)
Prothrombin Time: 24.8 s — ABNORMAL HIGH (ref 11.4–15.2)

## 2024-06-02 LAB — HEPARIN LEVEL (UNFRACTIONATED): Heparin Unfractionated: 0.45 [IU]/mL (ref 0.30–0.70)

## 2024-06-02 MED ORDER — WARFARIN SODIUM 2.5 MG PO TABS
2.5000 mg | ORAL_TABLET | Freq: Once | ORAL | Status: AC
  Administered 2024-06-02: 2.5 mg via ORAL
  Filled 2024-06-02: qty 1

## 2024-06-02 NOTE — Progress Notes (Signed)
 " PROGRESS NOTE    Terry [name]  FMW:[id number] DOB: 05-Mar-1936 DOA: 05/20/2024 PCP: Shayne Anes, MD   Brief Narrative:  89 y.o. male with a history of mitral valve replacement with mechanical valve, primary hypertension, hyperlipidemia, BPH presented with worsening leg swelling related to leg trauma and complicated by Coumadin  use, found to have a hematoma and evidence of compartment syndrome.  Orthopedics was consulted: Underwent debridement on 05/26/2024 with application of wound VAC.  He underwent debridement again on 05/28/2024.  PT recommended SNF placement.  Assessment & Plan:   Left lower extremity hematoma -Secondary injury and complicated by Coumadin  use.  Currently on empiric Zyvox  - Orthopedics following: Underwent debridement on 05/26/2024 with application of wound VAC.  He underwent debridement again on 05/28/2024.  Clear as per orthopedics recommendations.  Wound cultures no growth so far  History of mitral valve replacement -Currently on heparin  drip and Coumadin .  INR 2.1 today.  Goal INR is 2.5-3.5.  Essential hypertension Hyperlipidemia - Blood pressure stable.  Continue metoprolol  succinate.  Crestor  on hold because of elevated LFTs  Elevated LFTs - Resolved.  Resume Crestor  on discharge  Acute blood loss anemia Anemia of chronic disease - From chronic illnesses.  Baseline hemoglobin of 9-11.  Received 1 unit packed red cell during this hospitalization.  Hemoglobin stable.  Monitor recommended  Thrombocytosis - Possibly reactive.  Monitor intermittently  Physical deconditioning -PT recommending SNF placement.  TOC following  DVT prophylaxis: Heparin  drip and Coumadin  Code Status: Full Family Communication: None at bedside Disposition Plan: Status is: Inpatient Remains inpatient appropriate because: Of severity of illness    Consultants: Orthopedics  Procedures: As above  Antimicrobials:  Anti-infectives (From admission, onward)    Start      Dose/Rate Route Frequency Ordered Stop   05/31/24 1130  cefadroxil  (DURICEF) capsule 500 mg  Status:  Discontinued        500 mg Oral 2 times daily 05/31/24 1041 05/31/24 1042   05/26/24 1250  tobramycin  (NEBCIN ) powder  Status:  Discontinued          As needed 05/26/24 1250 05/26/24 1312   05/26/24 1250  vancomycin  (VANCOCIN ) powder  Status:  Discontinued          As needed 05/26/24 1251 05/26/24 1312   05/24/24 1700  cefTRIAXone  (ROCEPHIN ) 2 g in sodium chloride  0.9 % 100 mL IVPB  Status:  Discontinued        2 g 200 mL/hr over 30 Minutes Intravenous Daily 05/24/24 1100 05/31/24 1042   05/24/24 1200  linezolid  (ZYVOX ) tablet 600 mg        600 mg Oral Every 12 hours 05/24/24 1103     05/21/24 0200  piperacillin -tazobactam (ZOSYN ) IVPB 3.375 g  Status:  Discontinued       Placed in Followed by Linked Group   3.375 g 12.5 mL/hr over 240 Minutes Intravenous Every 8 hours 05/20/24 1822 05/24/24 1100   05/20/24 1830  metroNIDAZOLE  (FLAGYL ) IVPB 500 mg  Status:  Discontinued        500 mg 100 mL/hr over 60 Minutes Intravenous  Once 05/20/24 1819 05/20/24 1820   05/20/24 1830  metroNIDAZOLE  (FLAGYL ) IVPB 500 mg  Status:  Discontinued        500 mg 100 mL/hr over 60 Minutes Intravenous  Once 05/20/24 1820 05/20/24 1822   05/20/24 1830  vancomycin  (VANCOREADY) IVPB 1500 mg/300 mL        1,500 mg 150 mL/hr over 120 Minutes Intravenous  Once 05/20/24  1822 05/20/24 2151   05/20/24 1830  piperacillin -tazobactam (ZOSYN ) IVPB 3.375 g       Placed in Followed by Linked Group   3.375 g 100 mL/hr over 30 Minutes Intravenous  Once 05/20/24 1822 05/20/24 1917        Subjective: Patient seen and examined at bedside.  No fever, vomiting, worsening abdominal reported.  Objective: Vitals:   06/01/24 2132 06/02/24 0538 06/02/24 0811 06/02/24 1012  BP: (!) 103/51 (!) 120/55 129/66 (!) 108/55  Pulse: 85 85 86 80  Resp: 20 18 17 16   Temp:  97.6 F (36.4 C)  97.9 F (36.6 C)  TempSrc:  Oral   Oral  SpO2: 98% 98% 99% 100%  Weight:      Height:        Intake/Output Summary (Last 24 hours) at 06/02/2024 1238 Last data filed at 06/02/2024 0449 Gross per 24 hour  Intake 1207.19 ml  Output 225 ml  Net 982.19 ml   Filed Weights   05/20/24 2230 05/26/24 1050 05/28/24 1129  Weight: 85.1 kg 85 kg 85 kg    Examination:  General exam: No distress. Respiratory system: Bilateral decreased breath sounds at bases Cardiovascular system: S1 & S2 heard, Rate controlled Gastrointestinal system: Abdomen is mildly distended, soft and nontender. bowel sounds heard. Extremities: No cyanosis, clubbing, edema  Central nervous system: Alert and oriented. No focal neurological deficits. Moving extremities Skin: Left lower extremity with dressing and wound VAC present  psychiatry: Judgement and insight appear normal. Mood & affect appropriate.     Data Reviewed: I have personally reviewed following labs and imaging studies  CBC: Recent Labs  Lab 05/29/24 0339 05/30/24 0343 05/31/24 0428 06/01/24 0505 06/01/24 1439 06/01/24 2017 06/02/24 0512  WBC 10.1 9.7 8.5 9.4  --   --  8.7  HGB 8.4* 8.7* 8.6* 8.1* 7.5* 7.6* 7.9*  HCT 26.0* 26.5* 26.8* 25.1* 23.8* 24.3* 24.2*  MCV 97.0 98.1 99.6 100.0  --   --  101.3*  PLT 437* 462* 489* 466*  --   --  441*   Basic Metabolic Panel: Recent Labs  Lab 05/28/24 1504 06/01/24 0505  NA 136 136  K 4.1 4.0  CL 103 105  CO2 24 23  GLUCOSE 108* 104*  BUN 20 17  CREATININE 0.76 0.84  CALCIUM  8.1* 8.1*   GFR: Estimated Creatinine Clearance: 67.2 mL/min (by C-G formula based on SCr of 0.84 mg/dL). Liver Function Tests: Recent Labs  Lab 05/28/24 1504 06/01/24 0505  AST 73* 28  ALT 90* 33  ALKPHOS 66 57  BILITOT 0.4 0.4  PROT 5.6* 5.5*  ALBUMIN 2.9* 2.9*   No results for input(s): LIPASE, AMYLASE in the last 168 hours. No results for input(s): AMMONIA in the last 168 hours. Coagulation Profile: Recent Labs  Lab 05/30/24 0343  05/31/24 0428 06/01/24 0505 06/02/24 0512  INR 1.1 1.5* 1.8* 2.1*   Cardiac Enzymes: Recent Labs  Lab 05/28/24 1504  CKTOTAL 126   BNP (last 3 results) No results for input(s): PROBNP in the last 8760 hours. HbA1C: No results for input(s): HGBA1C in the last 72 hours. CBG: No results for input(s): GLUCAP in the last 168 hours. Lipid Profile: No results for input(s): CHOL, HDL, LDLCALC, TRIG, CHOLHDL, LDLDIRECT in the last 72 hours. Thyroid Function Tests: No results for input(s): TSH, T4TOTAL, FREET4, T3FREE, THYROIDAB in the last 72 hours. Anemia Panel: No results for input(s): VITAMINB12, FOLATE, FERRITIN, TIBC, IRON, RETICCTPCT in the last 72 hours. Sepsis Labs: No  results for input(s): PROCALCITON, LATICACIDVEN in the last 168 hours.  Recent Results (from the past 240 hours)  MRSA Next Gen by PCR, Nasal     Status: None   Collection Time: 05/24/24 11:01 AM   Specimen: Nasal Mucosa; Nasal Swab  Result Value Ref Range Status   MRSA by PCR Next Gen NOT DETECTED NOT DETECTED Final    Comment: (NOTE) The GeneXpert MRSA Assay (FDA approved for NASAL specimens only), is one component of a comprehensive MRSA colonization surveillance program. It is not intended to diagnose MRSA infection nor to guide or monitor treatment for MRSA infections. Test performance is not FDA approved in patients less than 56 years old. Performed at [facility], [address] [place], [place] [address]   MRSA Next Gen by PCR, Nasal     Status: None   Collection Time: 05/27/24  4:51 PM   Specimen: Nasal Mucosa; Nasal Swab  Result Value Ref Range Status   MRSA by PCR Next Gen NOT DETECTED NOT DETECTED Final    Comment: (NOTE) The GeneXpert MRSA Assay (FDA approved for NASAL specimens only), is one component of a comprehensive MRSA colonization surveillance program. It is not intended to diagnose MRSA infection nor to guide or monitor treatment  for MRSA infections. Test performance is not FDA approved in patients less than 91 years old. Performed at [facility], [address], [place], [place] [address]          Radiology Studies: No results found.      Scheduled Meds:  acetaminophen   1,000 mg Oral Q8H   dorzolamide -timolol   1 drop Both Eyes BID   lidocaine   1 patch Transdermal Q24H   linezolid   600 mg Oral Q12H   metoprolol  succinate  50 mg Oral Daily   mouth rinse  15 mL Mouth Rinse Once   pantoprazole   40 mg Oral QODAY   Tafluprost  (PF)  1 drop Both Eyes QHS   warfarin  2.5 mg Oral ONCE-1600   Warfarin - Pharmacist Dosing Inpatient   Does not apply q1600   Continuous Infusions:  heparin  1,200 Units/hr (06/02/24 0449)          Sophie Mao, MD [facility] 06/02/2024, 12:38 PM   "

## 2024-06-02 NOTE — Progress Notes (Signed)
 PHARMACY - ANTICOAGULATION CONSULT NOTE  Pharmacy Consult for Heparin  and Warfarin Indication: mech MVR   Patient Measurements: Height: 5' 11 (180.3 cm) Weight: 85 kg (187 lb 6.3 oz) IBW/kg (Calculated) : 75.3 HEPARIN  DW (KG): 85  Vital Signs: Temp: 97.6 F (36.4 C) (01/21 0538) Temp Source: Oral (01/21 0538) BP: 120/55 (01/21 0538) Pulse Rate: 85 (01/21 0538)  Labs: Recent Labs    05/31/24 0428 05/31/24 1545 06/01/24 0505 06/01/24 1439 06/01/24 2017 06/02/24 0512  HGB 8.6*  --  8.1* 7.5* 7.6* 7.9*  HCT 26.8*  --  25.1* 23.8* 24.3* 24.2*  PLT 489*  --  466*  --   --  441*  LABPROT 18.9*  --  21.8*  --   --  24.8*  INR 1.5*  --  1.8*  --   --  2.1*  HEPARINUNFRC 0.56 0.43 0.29*  --   --  0.45  CREATININE  --   --  0.84  --   --   --     Estimated Creatinine Clearance: 67.2 mL/min (by C-G formula based on SCr of 0.84 mg/dL).  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 Warfarin reversed with vitamin K po 2.5mg  x1  05/20/24.  Home warfarin regimen = 2.5 mg Mon, Wed, Fri and 5 mg Tues, Thur, Sat, Sun (weekly dose = 27.5). INR 3.3 on admission on this regimen.   Heparin  level therapeutic at 0.45.  INR subtherapeutic at 2.1 (up from 1.8 yesterday, received 2 days of 7.5 mg and then back to home regimen 2.5 mg / 5 mg). INR normally takes 3-4 days to respond, should continue to rise.   Goal of Therapy:  Heparin  level 0.3-0.5 units/ml INR 2.5- 3.5 Monitor platelets by anticoagulation protocol: Yes   Plan:  Continue heparin  1200 units / hr. Pending transition to lovenox  for continued bridge if needed at discharge. Waiting on SNF bed / insurance.  Once INR at least 2.5, can stop parenteral anticoagulant bridge therapy  Warfarin 2.5 mg po x 1 dose today (25 mg of total weekly dose given in past 5 days, anticipate ~35-37.5 mg weekly dose this week to overcome effects of 2.5 mg PO vitamin K given on  1/8)  Monitor daily heparin  level, CBC, INR When ready for discharge, okay to resume home regimen  Rankin Sams, PharmD, BCCCP Clinical Pharmacist

## 2024-06-02 NOTE — Plan of Care (Signed)

## 2024-06-02 NOTE — Progress Notes (Signed)
 Wound vac dress on left leg had a leak. Charge nurse and I reinforced the dressing now the machine is running again however the patient leg is still leaking. Duda,MD made aware and asked to come assess patients leg

## 2024-06-02 NOTE — Progress Notes (Signed)
" °   06/02/24 0811  Vitals  BP 129/66  BP Location Right Arm  BP Method Automatic  Patient Position (if appropriate) Lying  Pulse Rate 86  Pulse Rate Source Dinamap  Resp 17  Level of Consciousness  Level of Consciousness Alert  MEWS COLOR  MEWS Score Color Green  Oxygen Therapy  SpO2 99 %  O2 Device Room Air  Pain Assessment  Pain Scale 0-10  Pain Score 0  MEWS Score  MEWS Temp 0  MEWS Systolic 0  MEWS Pulse 0  MEWS RR 0  MEWS LOC 0  MEWS Score 0    "

## 2024-06-02 NOTE — Progress Notes (Signed)
 Wound vac removed per order by charge nurse and I.4 x 4 gauze dampened with Vashe and secured with an Ace wrap. Pt had a lot of pain during removal. 5mg  PRN oxy given to pt. Pic of wound post removal placed in media in chart

## 2024-06-02 NOTE — TOC Progression Note (Addendum)
 Transition of Care [facility]) - Progression Note    Patient Details  Name: ROYCE STEGMAN MRN: [id number] Date of Birth: [birth year]  [facility] CM/SW Contact  Romir Klimowicz LITTIE [place], [place] Phone Number: 06/02/2024, 3:04 PM  Clinical Narrative:    CSW spoke with pt at bedside and provided SNF bed offers. Pt chose [facility]. CSW initiated insurance auth process, shara has been approved for [facility]. Auth ID [id number], effective 1/22-1/26. CSW confirmed bed availability with facility. Pt can admit following hospital DC.   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               Expected Discharge Plan and Services   Discharge Planning Services: CM Consult Post Acute Care Choice: NA Living arrangements for the past 2 months: Single Family Home                 DME Arranged:  (Await recommendations)         HH Arranged: NA           Social Drivers of Health (SDOH) Interventions SDOH Screenings   Food Insecurity: No Food Insecurity (05/20/2024)  Housing: Low Risk (05/20/2024)  Transportation Needs: No Transportation Needs (05/20/2024)  Utilities: Not At Risk (05/20/2024)  Social Connections: Socially Isolated (05/20/2024)  Tobacco Use: Medium Risk (05/28/2024)    Readmission Risk Interventions     No data to display

## 2024-06-03 DIAGNOSIS — T148XXA Other injury of unspecified body region, initial encounter: Secondary | ICD-10-CM | POA: Diagnosis not present

## 2024-06-03 LAB — CBC
HCT: 23.2 % — ABNORMAL LOW (ref 39.0–52.0)
Hemoglobin: 7.4 g/dL — ABNORMAL LOW (ref 13.0–17.0)
MCH: 32.3 pg (ref 26.0–34.0)
MCHC: 31.9 g/dL (ref 30.0–36.0)
MCV: 101.3 fL — ABNORMAL HIGH (ref 80.0–100.0)
Platelets: 408 K/uL — ABNORMAL HIGH (ref 150–400)
RBC: 2.29 MIL/uL — ABNORMAL LOW (ref 4.22–5.81)
RDW: 16.7 % — ABNORMAL HIGH (ref 11.5–15.5)
WBC: 9.2 K/uL (ref 4.0–10.5)
nRBC: 0.2 % (ref 0.0–0.2)

## 2024-06-03 LAB — BASIC METABOLIC PANEL WITH GFR
Anion gap: 7 (ref 5–15)
BUN: 23 mg/dL (ref 8–23)
CO2: 24 mmol/L (ref 22–32)
Calcium: 8.3 mg/dL — ABNORMAL LOW (ref 8.9–10.3)
Chloride: 105 mmol/L (ref 98–111)
Creatinine, Ser: 0.84 mg/dL (ref 0.61–1.24)
GFR, Estimated: >60 mL/min
Glucose, Bld: 97 mg/dL (ref 70–99)
Potassium: 4 mmol/L (ref 3.5–5.1)
Sodium: 136 mmol/L (ref 135–145)

## 2024-06-03 LAB — PROTIME-INR
INR: 2.1 — ABNORMAL HIGH (ref 0.8–1.2)
Prothrombin Time: 24.8 s — ABNORMAL HIGH (ref 11.4–15.2)

## 2024-06-03 LAB — HEPARIN LEVEL (UNFRACTIONATED): Heparin Unfractionated: 0.33 [IU]/mL (ref 0.30–0.70)

## 2024-06-03 LAB — MAGNESIUM: Magnesium: 2.2 mg/dL (ref 1.7–2.4)

## 2024-06-03 MED ORDER — ENOXAPARIN SODIUM 100 MG/ML IJ SOSY
85.0000 mg | PREFILLED_SYRINGE | Freq: Two times a day (BID) | INTRAMUSCULAR | Status: DC
  Administered 2024-06-03: 85 mg via SUBCUTANEOUS
  Filled 2024-06-03 (×2): qty 0.85

## 2024-06-03 MED ORDER — OXYCODONE HCL 5 MG PO TABS: 5.0000 mg | ORAL_TABLET | Freq: Four times a day (QID) | ORAL | 0 refills | Status: DC | PRN

## 2024-06-03 MED ORDER — LIDOCAINE 5 % EX PTCH: 1.0000 | MEDICATED_PATCH | CUTANEOUS | 0 refills | Status: AC

## 2024-06-03 MED ORDER — POLYETHYLENE GLYCOL 3350 17 G PO PACK: 17.0000 g | PACK | Freq: Every day | ORAL | 0 refills | Status: AC

## 2024-06-03 MED ORDER — ENOXAPARIN SODIUM 100 MG/ML IJ SOSY: 85.0000 mg | PREFILLED_SYRINGE | Freq: Two times a day (BID) | INTRAMUSCULAR | 0 refills | Status: DC

## 2024-06-03 MED ORDER — WARFARIN SODIUM 5 MG PO TABS: 5.0000 mg | ORAL_TABLET | Freq: Every day | ORAL | Status: AC

## 2024-06-03 MED ORDER — WARFARIN SODIUM 5 MG PO TABS
5.0000 mg | ORAL_TABLET | Freq: Once | ORAL | Status: DC
  Filled 2024-06-03: qty 1

## 2024-06-03 MED ORDER — ACETAMINOPHEN 500 MG PO TABS: 1000.0000 mg | ORAL_TABLET | Freq: Three times a day (TID) | ORAL | Status: AC

## 2024-06-03 NOTE — Progress Notes (Addendum)
 PHARMACY - ANTICOAGULATION CONSULT NOTE  Pharmacy Consult for Heparin  and Warfarin Indication: mech MVR   Patient Measurements: Height: 5' 11 (180.3 cm) Weight: 85 kg (187 lb 6.3 oz) IBW/kg (Calculated) : 75.3 HEPARIN  DW (KG): 85  Vital Signs: Temp: 97.5 F (36.4 C) (01/21 2311) Temp Source: Oral (01/21 2311) BP: 121/56 (01/22 0600) Pulse Rate: 90 (01/22 0600)  Labs: Recent Labs    06/01/24 0505 06/01/24 1439 06/01/24 2017 06/02/24 0512 06/03/24 0512  HGB 8.1*   < > 7.6* 7.9* 7.4*  HCT 25.1*   < > 24.3* 24.2* 23.2*  PLT 466*  --   --  441* 408*  LABPROT 21.8*  --   --  24.8* 24.8*  INR 1.8*  --   --  2.1* 2.1*  HEPARINUNFRC 0.29*  --   --  0.45 0.33  CREATININE 0.84  --   --   --  0.84   < > = values in this interval not displayed.    Estimated Creatinine Clearance: 67.2 mL/min (by C-G formula based on SCr of 0.84 mg/dL).  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 Warfarin reversed with vitamin K po 2.5mg  x1  05/20/24.  Home warfarin regimen = 2.5 mg Mon, Wed, Fri and 5 mg Tues, Thur, Sat, Sun  > INR 3.3 on admission on this regimen  Heparin  level therapeutic at 0.45.  INR subtherapeutic at 2.1. INR normally takes 3-4 days to respond, should continue to rise.   Pending transition to lovenox  for continued bridge if needed at discharge. Waiting on SNF bed / insurance.   Goal of Therapy:  Heparin  level 0.3-0.5 units/ml INR 2.5- 3.5 Monitor platelets by anticoagulation protocol: Yes   Plan:  Give warfarin 5 mg PO x1 (25 mg of total weekly dose given in past 5 days, anticipate ~35-37.5 mg weekly dose this week to overcome effects of 2.5 mg PO vitamin K given on 1/8)  Continue heparin  infusion at 1200 units/hr  Check INR daily while on warfarin  Check heparin  level daily while on heparin  Continue to monitor H&H and platelets  When ready for discharge, recommend the following: continue  warfarin dose at higher end of home dosing (5mg  daily until follow-up with [facility] or at SNF within next 2-3 days) Enoxaparin  85mg  SQ Q12h until INR therapeutic > 2.5   Thank you for allowing pharmacy to be a part of this patients care.  Shelba Collier, PharmD, BCPS Clinical Pharmacist

## 2024-06-03 NOTE — Plan of Care (Signed)
  Problem: Education: Goal: Knowledge of General Education information will improve Description: Including pain rating scale, medication(s)/side effects and non-pharmacologic comfort measures Outcome: Progressing   Problem: Health Behavior/Discharge Planning: Goal: Ability to manage health-related needs will improve Outcome: Progressing   Problem: Coping: Goal: Level of anxiety will decrease Outcome: Progressing   Problem: Skin Integrity: Goal: Risk for impaired skin integrity will decrease Outcome: Progressing

## 2024-06-03 NOTE — Discharge Summary (Signed)
 Physician Discharge Summary  Terry [name] FMW:[id number] DOB: Feb 09, 1936 DOA: 05/20/2024  PCP: Shayne Anes, MD  Admit date: 05/20/2024 Discharge date: 06/03/2024  Admitted From: Home Disposition: SNF  Recommendations for Outpatient Follow-up:  Follow up with SNF provider at earliest convenience with repeat CBC/BMP/INR. Discharge wound care as per orthopedics/Dr. Harden.  Outpatient follow-up with Dr. Harden Follow up in ED if symptoms worsen or new appear   Home Health: No Equipment/Devices: None  Discharge Condition: Stable CODE STATUS: Full Diet recommendation: Heart healthy  Brief/Interim Summary: 89 y.o. male with a history of mitral valve replacement with mechanical valve, primary hypertension, hyperlipidemia, BPH presented with worsening leg swelling related to leg trauma and complicated by Coumadin  use, found to have a hematoma and evidence of compartment syndrome.  Orthopedics was consulted: Underwent debridement on 05/26/2024 with application of wound VAC.  He underwent debridement again on 05/28/2024.  PT recommended SNF placement.  Dr. Harden has cleared the patient for discharge.  Patient is currently hemodynamically stable on heparin  drip and Coumadin .  He will be discharged to SNF today on Lovenox  therapeutic dosing twice a day along with Coumadin  until INR reaches the goal.  Discharge Diagnoses:   Left lower extremity hematoma -Secondary injury and complicated by Coumadin  use.  Currently on empiric Zyvox  - Orthopedics following: Underwent debridement on 05/26/2024 with application of wound VAC.  He underwent debridement again on 05/28/2024.  Wound care as per orthopedics recommendations.  Wound cultures no growth so far.  Wound VAC has been removed. -Dr. Harden has cleared the patient for discharge.  Patient is currently hemodynamically stable  - Discharge patient to SNF today.  Outpatient follow-up with orthopedics.   History of mitral valve replacement -Currently on heparin   drip and Coumadin .  INR 2.1 today.  Goal INR is 2.5-3.5. -He will be discharged to SNF today on Lovenox  therapeutic dosing twice a day along with Coumadin  until INR reaches the goal.   Essential hypertension Hyperlipidemia - Blood pressure stable.  Continue metoprolol  succinate.  Resume Crestor   Elevated LFTs - Resolved.    Acute blood loss anemia Anemia of chronic disease - From chronic illnesses.  Baseline hemoglobin of 9-11.  Received 1 unit packed red cell during this hospitalization.  Hemoglobin 7.4 today.  Outpatient follow-up   thrombocytosis - Possibly reactive.  Monitor intermittently as an outpatient   Physical deconditioning -PT recommending SNF placement.   Discharge Instructions  Discharge Instructions     Change dressing   Complete by: As directed    Daily dressing changes to the left lower extremity with 4 x 4 gauze dampened with Vashe secured with Ace wrap   Diet - low sodium heart healthy   Complete by: As directed    Discharge wound care:   Complete by: As directed    As per orthopedics recommendations   Increase activity slowly   Complete by: As directed       Allergies as of 06/03/2024       Reactions   Alendronate Other (See Comments)   GERD   Aspirin    too much bruising when added to Coumadin    Cipro [ciprofloxacin Hcl] Other (See Comments)   Heartburn    Sulfa Drugs Cross Reactors Hives        Medication List     STOP taking these medications    aspirin EC 81 MG tablet   ofloxacin 0.3 % OTIC solution Commonly known as: FLOXIN       TAKE these medications  acetaminophen  500 MG tablet Commonly known as: TYLENOL  Take 2 tablets (1,000 mg total) by mouth every 8 (eight) hours. What changed:  how much to take when to take this reasons to take this   dorzolamide -timolol  2-0.5 % ophthalmic solution Commonly known as: COSOPT  Place 1 drop into both eyes 2 (two) times daily.   enoxaparin  100 MG/ML injection Commonly known  as: LOVENOX  Inject 0.85 mLs (85 mg total) into the skin every 12 (twelve) hours.   lidocaine  5 % Commonly known as: LIDODERM  Place 1 patch onto the skin daily. Remove & Discard patch within 12 hours or as directed by MD   metoprolol  succinate 50 MG 24 hr tablet Commonly known as: TOPROL -XL Take 50 mg by mouth daily.   multivitamin per tablet Take 1 tablet by mouth daily.   omeprazole 20 MG capsule Commonly known as: PRILOSEC Take 20 mg by mouth every other day.   oxyCODONE  5 MG immediate release tablet Commonly known as: Oxy IR/ROXICODONE  Take 1 tablet (5 mg total) by mouth every 6 (six) hours as needed for severe pain (pain score 7-10).   polyethylene glycol 17 g packet Commonly known as: MiraLax  Take 17 g by mouth daily.   rosuvastatin  20 MG tablet Commonly known as: CRESTOR  Take 20 mg by mouth daily.   Tafluprost  (PF) 0.0015 % Soln Place 1 drop into both eyes at bedtime.   warfarin 5 MG tablet Commonly known as: COUMADIN  Take as directed. If you are unsure how to take this medication, talk to your nurse or doctor. Original instructions: Take 1 tablet (5 mg total) by mouth daily at 4 PM. What changed:  how much to take how to take this when to take this additional instructions               Durable Medical Equipment  (From admission, onward)           Start     Ordered   05/28/24 1006  For home use only DME 3 n 1  Once        05/28/24 1005              Discharge Care Instructions  (From admission, onward)           Start     Ordered   06/03/24 0000  Discharge wound care:       Comments: As per orthopedics recommendations   06/03/24 1000   06/01/24 0000  Change dressing       Comments: Daily dressing changes to the left lower extremity with 4 x 4 gauze dampened with Vashe secured with Ace wrap   06/01/24 0857            Contact information for follow-up providers     Harden Jerona GAILS, MD Follow up in 1 week(s).   Specialty:  Orthopedic Surgery Contact information: [address] [place] [address] [phone]              Contact information for after-discharge care     Home Medical Care     [facility]) Follow up.   Service: Home Health Services Contact information: [address] [place]  [address] [phone]                    Allergies[1]  Consultations: Orthopedics   Procedures/Studies: CT ANGIO LOWER EXT BILAT W &/OR WO CONTRAST Result Date: 05/23/2024 EXAM: CTA BILATERAL LOWER EXTREMITY 05/23/2024 08:19:00 PM TECHNIQUE:  Contrast-enhanced computed tomography angiography of the lower extremity was performed with multiplanar reconstructions. Maximum intensity projection images were created on a separate workstation and reviewed. Automated exposure control, iterative reconstruction, and/or weight based adjustment of the mA/kV was utilized to reduce the radiation dose to as low as reasonably achievable. iohexol  (OMNIPAQUE ) 350 MG/ML injection was administered. COMPARISON: None available. CLINICAL HISTORY: Claudication or leg ischemia. FINDINGS: ARTERIAL: Limitations: Distal abdominal aorta, common iliac arteries, and proximal internal and external iliac arteries are excluded from view. Visualized internal iliac arteries are widely patent bilaterally. Right Lower Extremity: RIGHT COMMON ILIAC ARTERY: Not visualized. RIGHT EXTERNAL ILIAC ARTERY: Proximal portion not visualized. Visualized portions of the right external iliac artery are widely patent. COMMON FEMORAL ARTERY: Widely patent. SUPERFICIAL FEMORAL ARTERY: Widely patent. POPLITEAL ARTERY: Widely patent. TIBIOPERONEAL TRUNK: Widely patent. ANTERIOR TIBIAL ARTERY: Widely patent. Flow is identified in the anterior tibial artery to the ankle. The right dorsalis pedis artery is diminutive and patency is not confirmed on this examination. PERONEAL ARTERY: Widely patent. Flow is  identified to the ankle. POSTERIOR TIBIAL ARTERY: Widely patent. Posterior tibial artery flow is present into the hindfoot. The plantar arch is patent. Left Lower Extremity: LEFT COMMON ILIAC ARTERY: Not visualized. LEFT EXTERNAL ILIAC ARTERY: Proximal portion not visualized. Visualized portions of the left external iliac artery are widely patent. COMMON FEMORAL ARTERY: Widely patent. SUPERFICIAL FEMORAL ARTERY: Widely patent. POPLITEAL ARTERY: Widely patent. TIBIOPERONEAL TRUNK: Widely patent. ANTERIOR TIBIAL ARTERY: Widely patent. Flow is identified in the anterior tibial artery to the ankle. Dorsalis pedis is patent. PERONEAL ARTERY: Widely patent. Flow is identified to the ankle. POSTERIOR TIBIAL ARTERY: Widely patent. Posterior tibial artery flow is present into the hindfoot. Plantar arch is patent. BONES AND SOFT TISSUES: Mild subcutaneous edema noted within the posterolateral subcutaneous soft tissues of the left thigh in keeping with superficial contusion. Edema is seen within the posterior muscular compartment surrounding the flexor musculature and popliteal vasculature without mass-forming hematoma identified in this region. Within the infrapopliteal left lower extremity, there are subcutaneous hematoma seen anterolaterally measuring 2.6 x 7.2 x 14.9 cm (volume 144.5 cm\S\3) and posterolaterally measuring 4.2 x 10.2 x 17.5 cm (volume 388.9 cm\S\3). Hematoma demonstrate marked mass effect upon the superficial posterior and anterior compartment musculature but remained confined to the subcutaneous compartment. Moderate subcutaneous edema noted within the peripheral left lower extremity. Moderate right prepatellar subcutaneous edema. INCIDENTAL ABDOMINAL FINDINGS: Moderate sigmoid diverticulosis. Marked prostatic hypertrophy. IMPRESSION: 1. Right lower extremity arterial inflow is widely patent. 2. Right lower extremity arterial outflow is widely patent. 3. Right lower extremity runoff demonstrates 3-vessel  runoff to the ankle with diminutive dorsalis pedis artery and patency not confirmed, and a patent plantar arch. 4. Left lower extremity arterial inflow is widely patent. 5. Left lower extremity arterial outflow is widely patent. 6. Left lower extremity runoff demonstrates 3-vessel runoff to the ankle with patent dorsalis pedis artery and plantar arch, with large infrapopliteal subcutaneous hematomas and associated left lower extremity edema/contusion as described, with moderate sigmoid diverticulosis and marked prostatic hypertrophy. 7. Multiple hematomas within the left lower extremity as outlined above demonstrating significant mass effect upon the subjacent musculature of the superficial posterior and anterior compartments. Electronically signed by: Dorethia Molt MD MD 05/23/2024 08:53 PM EST RP Workstation: [id number]   CT Angio Aortobifemoral W and/or Wo Contrast Result Date: 05/20/2024 EXAM: CTA ABDOMEN AND PELVIS WITH CONTRAST AND RUNOFF CTA OF THE LOWER EXTREMITIES WITH CONTRAST 05/20/2024 05:13:59 PM TECHNIQUE: CTA images of the abdomen, pelvis  and lower extremities with intravenous contrast. 100 mL of iohexol  (OMNIPAQUE ) 350 MG/ML injection was administered. Three-dimensional MIP/volume rendered formations were performed. Automated exposure control, iterative reconstruction, and/or weight based adjustment of the mA/kV was utilized to reduce the radiation dose to as low as reasonably achievable. COMPARISON: CT virtual colonoscopy 11/01/2021. CLINICAL HISTORY: Claudication or leg ischemia; eval hematoma, ?active extrav LLE hematoma, abscess. FINDINGS: VASCULATURE: AORTA: Atherosclerotic calcifications of the aorta. No acute finding. No abdominal aortic aneurysm. No dissection. CELIAC TRUNK: No acute finding. No occlusion or significant stenosis. SUPERIOR MESENTERIC ARTERY: No acute finding. No occlusion or significant stenosis. INFERIOR MESENTERIC ARTERY: No acute finding. No occlusion or significant  stenosis. RENAL ARTERIES: No acute finding. No occlusion or significant stenosis. RIGHT ILIAC ARTERIES: Atherosclerotic calcifications of the common iliac arteries. No acute finding. No occlusion or significant stenosis. RIGHT FEMORAL ARTERIES: Atherosclerotic calcifications of the superficial femoral artery. No acute finding. No occlusion or significant stenosis. RIGHT POPLITEAL ARTERY: No acute finding. No occlusion or significant stenosis. RIGHT CALF ARTERIES: Atherosclerotic calcifications of calf vessels. No acute finding. No occlusion or significant stenosis. LEFT ILIAC ARTERIES: Atherosclerotic calcifications of the common iliac arteries. No acute finding. No occlusion or significant stenosis. LEFT FEMORAL ARTERIES: Atherosclerotic calcifications of the superficial femoral artery. No acute finding. No occlusion or significant stenosis. LEFT POPLITEAL ARTERY: No acute finding. No occlusion or significant stenosis. LEFT CALF ARTERIES: Atherosclerotic calcifications of calf vessels. No acute finding. No occlusion or significant stenosis. ABDOMEN AND PELVIS: LOWER CHEST: There is a 6 mm right middle lobe nodule. LIVER: The liver is unremarkable. GALLBLADDER AND BILE DUCTS: Gallbladder is unremarkable. No biliary ductal dilatation. SPLEEN: The spleen is unremarkable. PANCREAS: The pancreas is unremarkable. ADRENAL GLANDS: Bilateral adrenal glands demonstrate no acute abnormality. KIDNEYS, URETERS AND BLADDER: No stones in the kidneys or ureters. No hydronephrosis. No evidence of perinephric or periureteral stranding. Urinary bladder is unremarkable. GI AND Bowel: Stomach and duodenal sweep demonstrate no acute abnormality. There is sigmoid colon diverticulosis. The appendix is not visualized. There is no bowel obstruction. No abnormal bowel wall thickening or distension. REPRODUCTIVE: Prostate gland is enlarged measuring 6.9 cm in transverse dimension. PERITONEUM AND RETROPERITONEUM: No ascites or free air. LYMPH  NODES: Mildly enlarged left inguinal lymph nodes measuring up to 11 mm. BONES AND SOFT TISSUES: There is edema throughout the bilateral lower extremities below the level of the knees including feet and ankles, greater on the left. There is a subcutaneous collection anterior to the mid tibia measuring 2.9 x 5.9 x 12.7 cm with a hyperdense fluid level. There is a small amount of active extravasation identified in the mid and lower portions of the collection without a definitive feeder vessel seen. There is no soft tissue gas or foreign body identified. There is no acute osseous abnormality. IMPRESSION: 1. Left anterior mid tibial subcutaneous hematoma with a small focus of active extravasation and no definite feeder vessel identified. 2. Edema throughout the bilateral lower extremities below the level of the knees, greater on the left. 3. Atherosclerotic calcifications of the aorta, common iliac arteries, calf vessels, and superficial femoral artery bilaterally. 4. Solid 6 mm right middle lobe pulmonary nodule, recommend noncontrast chest CT at 6-12 months per Fleischner Society Guidelines. Electronically signed by: Greig Pique MD MD 05/20/2024 06:04 PM EST RP Workstation: [id number]      Subjective: Patient seen examined at bedside.  Feels better and feels okay to be discharged today.  No fever, vomiting, abdominal pain reported.  Discharge Exam: Vitals:  06/02/24 2311 06/03/24 0600  BP: (!) 124/59 (!) 121/56  Pulse: 93 90  Resp: 18 16  Temp: (!) 97.5 F (36.4 C)   SpO2: 98% 97%    General: Pt is alert, awake, not in acute distress.  Elderly male lying in bed.  On room air. Cardiovascular: rate controlled, S1/S2 + Respiratory: bilateral decreased breath sounds at bases with scattered crackles Abdominal: Soft, NT, ND, bowel sounds + Extremities: Left lower extremity dressing present   The results of significant diagnostics from this hospitalization (including imaging, microbiology,  ancillary and laboratory) are listed below for reference.     Microbiology: Recent Results (from the past 240 hours)  MRSA Next Gen by PCR, Nasal     Status: None   Collection Time: 05/24/24 11:01 AM   Specimen: Nasal Mucosa; Nasal Swab  Result Value Ref Range Status   MRSA by PCR Next Gen NOT DETECTED NOT DETECTED Final    Comment: (NOTE) The GeneXpert MRSA Assay (FDA approved for NASAL specimens only), is one component of a comprehensive MRSA colonization surveillance program. It is not intended to diagnose MRSA infection nor to guide or monitor treatment for MRSA infections. Test performance is not FDA approved in patients less than 46 years old. Performed at [facility], [address] [place], [place] [address]   MRSA Next Gen by PCR, Nasal     Status: None   Collection Time: 05/27/24  4:51 PM   Specimen: Nasal Mucosa; Nasal Swab  Result Value Ref Range Status   MRSA by PCR Next Gen NOT DETECTED NOT DETECTED Final    Comment: (NOTE) The GeneXpert MRSA Assay (FDA approved for NASAL specimens only), is one component of a comprehensive MRSA colonization surveillance program. It is not intended to diagnose MRSA infection nor to guide or monitor treatment for MRSA infections. Test performance is not FDA approved in patients less than 52 years old. Performed at [facility], [address] [place], [place] [address]      Labs: BNP (last 3 results) No results for input(s): BNP in the last 8760 hours. Basic Metabolic Panel: Recent Labs  Lab 05/28/24 1504 06/01/24 0505 06/03/24 0512  NA 136 136 136  K 4.1 4.0 4.0  CL 103 105 105  CO2 24 23 24   GLUCOSE 108* 104* 97  BUN 20 17 23   CREATININE 0.76 0.84 0.84  CALCIUM  8.1* 8.1* 8.3*  MG  --   --  2.2   Liver Function Tests: Recent Labs  Lab 05/28/24 1504 06/01/24 0505  AST 73* 28  ALT 90* 33  ALKPHOS 66 57  BILITOT 0.4 0.4  PROT 5.6* 5.5*  ALBUMIN 2.9* 2.9*   No results for input(s): LIPASE,  AMYLASE in the last 168 hours. No results for input(s): AMMONIA in the last 168 hours. CBC: Recent Labs  Lab 05/30/24 0343 05/31/24 0428 06/01/24 0505 06/01/24 1439 06/01/24 2017 06/02/24 0512 06/03/24 0512  WBC 9.7 8.5 9.4  --   --  8.7 9.2  HGB 8.7* 8.6* 8.1* 7.5* 7.6* 7.9* 7.4*  HCT 26.5* 26.8* 25.1* 23.8* 24.3* 24.2* 23.2*  MCV 98.1 99.6 100.0  --   --  101.3* 101.3*  PLT 462* 489* 466*  --   --  441* 408*   Cardiac Enzymes: Recent Labs  Lab 05/28/24 1504  CKTOTAL 126   BNP: Invalid input(s): POCBNP CBG: No results for input(s): GLUCAP in the last 168 hours. D-Dimer No results for input(s): DDIMER in the last  72 hours. Hgb A1c No results for input(s): HGBA1C in the last 72 hours. Lipid Profile No results for input(s): CHOL, HDL, LDLCALC, TRIG, CHOLHDL, LDLDIRECT in the last 72 hours. Thyroid function studies No results for input(s): TSH, T4TOTAL, T3FREE, THYROIDAB in the last 72 hours.  Invalid input(s): FREET3 Anemia work up No results for input(s): VITAMINB12, FOLATE, FERRITIN, TIBC, IRON, RETICCTPCT in the last 72 hours. Urinalysis No results found for: COLORURINE, APPEARANCEUR, LABSPEC, PHURINE, GLUCOSEU, HGBUR, BILIRUBINUR, KETONESUR, PROTEINUR, UROBILINOGEN, NITRITE, LEUKOCYTESUR Sepsis Labs Recent Labs  Lab 05/31/24 0428 06/01/24 0505 06/02/24 0512 06/03/24 0512  WBC 8.5 9.4 8.7 9.2   Microbiology Recent Results (from the past 240 hours)  MRSA Next Gen by PCR, Nasal     Status: None   Collection Time: 05/24/24 11:01 AM   Specimen: Nasal Mucosa; Nasal Swab  Result Value Ref Range Status   MRSA by PCR Next Gen NOT DETECTED NOT DETECTED Final    Comment: (NOTE) The GeneXpert MRSA Assay (FDA approved for NASAL specimens only), is one component of a comprehensive MRSA colonization surveillance program. It is not intended to diagnose MRSA infection nor to guide or monitor treatment  for MRSA infections. Test performance is not FDA approved in patients less than 34 years old. Performed at [facility], [address] [place], [place] [address]   MRSA Next Gen by PCR, Nasal     Status: None   Collection Time: 05/27/24  4:51 PM   Specimen: Nasal Mucosa; Nasal Swab  Result Value Ref Range Status   MRSA by PCR Next Gen NOT DETECTED NOT DETECTED Final    Comment: (NOTE) The GeneXpert MRSA Assay (FDA approved for NASAL specimens only), is one component of a comprehensive MRSA colonization surveillance program. It is not intended to diagnose MRSA infection nor to guide or monitor treatment for MRSA infections. Test performance is not FDA approved in patients less than 27 years old. Performed at [facility], [address] [place], [place] [address]      Time coordinating discharge: 35 minutes  SIGNED:   Sophie Mao, MD  [facility] 06/03/2024, 10:01 AM      [1]  Allergies Allergen Reactions   Alendronate Other (See Comments)    GERD   Aspirin     too much bruising when added to Coumadin    Cipro [Ciprofloxacin Hcl] Other (See Comments)    Heartburn    Sulfa Drugs Cross Reactors Hives

## 2024-06-03 NOTE — TOC Transition Note (Signed)
 Transition of Care [facility]) - Discharge Note   Patient Details  Name: Terry [name] MRN: [id number] Date of Birth: [birth year]  Transition of [facility]) CM/SW Contact:  [name] [name] Phone Number: 06/03/2024, 10:47 AM   Clinical Narrative:    Patient will DC to: [facility] Anticipated DC date: 06/03/24 Family notified: Yes Transport by: [facility]   Per MD patient ready for DC to [facility]. RN to call report prior to discharge [phone] room 104B. RN, patient, patient's family, and facility notified of DC. Discharge Summary and FL2 sent to facility. DC packet on chart. Ambulance transport requested for patient.   CSW will sign off for now as social work intervention is no longer needed. Please consult us  again if new needs arise.     Final next level of care: Skilled Nursing Facility Barriers to Discharge: Barriers Resolved   Patient Goals and CMS Choice Patient states their goals for this hospitalization and ongoing recovery are:: SNF CMS Medicare.gov Compare Post Acute Care list provided to::  (await recommendations)        Discharge Placement   Existing PASRR number confirmed : 06/03/24          Patient chooses bed at: [facility] Patient to be transferred to facility by: [facility] Name of family member notified: [name] Patient and family notified of of transfer: 06/03/24  Discharge Plan and Services Additional resources added to the After Visit Summary for     Discharge Planning Services: CM Consult Post Acute Care Choice: NA          DME Arranged:  (Await recommendations)         HH Arranged: NA          Social Drivers of Health (SDOH) Interventions SDOH Screenings   Food Insecurity: No Food Insecurity (05/20/2024)  Housing: Low Risk (05/20/2024)  Transportation Needs: No Transportation Needs (05/20/2024)  Utilities: Not At Risk (05/20/2024)  Social Connections: Socially Isolated (05/20/2024)  Tobacco Use: Medium  Risk (05/28/2024)     Readmission Risk Interventions     No data to display

## 2024-06-03 NOTE — Progress Notes (Signed)
 Home medication (Zioptan  eyedrops) returned to patient.

## 2024-06-04 ENCOUNTER — Inpatient Hospital Stay (HOSPITAL_COMMUNITY)
Admission: EM | Admit: 2024-06-04 | Discharge: 2024-06-16 | DRG: 920 | Disposition: A | Discharge status: Skilled Nursing Facility | Source: Skilled Nursing Facility | Attending: Internal Medicine | Admitting: Internal Medicine

## 2024-06-04 ENCOUNTER — Encounter (HOSPITAL_COMMUNITY): Payer: Self-pay

## 2024-06-04 ENCOUNTER — Other Ambulatory Visit: Payer: Self-pay

## 2024-06-04 DIAGNOSIS — S8012XA Contusion of left lower leg, initial encounter: Secondary | ICD-10-CM

## 2024-06-04 DIAGNOSIS — D62 Acute posthemorrhagic anemia: Secondary | ICD-10-CM | POA: Diagnosis not present

## 2024-06-04 DIAGNOSIS — Z955 Presence of coronary angioplasty implant and graft: Secondary | ICD-10-CM

## 2024-06-04 DIAGNOSIS — I951 Orthostatic hypotension: Secondary | ICD-10-CM | POA: Diagnosis not present

## 2024-06-04 DIAGNOSIS — Z882 Allergy status to sulfonamides status: Secondary | ICD-10-CM

## 2024-06-04 DIAGNOSIS — N400 Enlarged prostate without lower urinary tract symptoms: Secondary | ICD-10-CM | POA: Diagnosis present

## 2024-06-04 DIAGNOSIS — Z886 Allergy status to analgesic agent status: Secondary | ICD-10-CM

## 2024-06-04 DIAGNOSIS — D6832 Hemorrhagic disorder due to extrinsic circulating anticoagulants: Secondary | ICD-10-CM | POA: Diagnosis present

## 2024-06-04 DIAGNOSIS — R531 Weakness: Secondary | ICD-10-CM

## 2024-06-04 DIAGNOSIS — D649 Anemia, unspecified: Secondary | ICD-10-CM | POA: Diagnosis present

## 2024-06-04 DIAGNOSIS — T45515A Adverse effect of anticoagulants, initial encounter: Secondary | ICD-10-CM | POA: Diagnosis present

## 2024-06-04 DIAGNOSIS — Z952 Presence of prosthetic heart valve: Secondary | ICD-10-CM

## 2024-06-04 DIAGNOSIS — Z7901 Long term (current) use of anticoagulants: Secondary | ICD-10-CM

## 2024-06-04 DIAGNOSIS — Z87891 Personal history of nicotine dependence: Secondary | ICD-10-CM

## 2024-06-04 DIAGNOSIS — Z1152 Encounter for screening for COVID-19: Secondary | ICD-10-CM

## 2024-06-04 DIAGNOSIS — E785 Hyperlipidemia, unspecified: Secondary | ICD-10-CM | POA: Diagnosis present

## 2024-06-04 DIAGNOSIS — R55 Syncope and collapse: Principal | ICD-10-CM

## 2024-06-04 DIAGNOSIS — I10 Essential (primary) hypertension: Secondary | ICD-10-CM | POA: Diagnosis present

## 2024-06-04 DIAGNOSIS — Z79899 Other long term (current) drug therapy: Secondary | ICD-10-CM

## 2024-06-04 DIAGNOSIS — Z881 Allergy status to other antibiotic agents status: Secondary | ICD-10-CM

## 2024-06-04 DIAGNOSIS — L7632 Postprocedural hematoma of skin and subcutaneous tissue following other procedure: Principal | ICD-10-CM | POA: Diagnosis present

## 2024-06-04 LAB — COMPREHENSIVE METABOLIC PANEL WITH GFR
ALT: 33 U/L (ref 0–44)
AST: 25 U/L (ref 15–41)
Albumin: 3.3 g/dL — ABNORMAL LOW (ref 3.5–5.0)
Alkaline Phosphatase: 58 U/L (ref 38–126)
Anion gap: 8 (ref 5–15)
BUN: 20 mg/dL (ref 8–23)
CO2: 25 mmol/L (ref 22–32)
Calcium: 8.7 mg/dL — ABNORMAL LOW (ref 8.9–10.3)
Chloride: 106 mmol/L (ref 98–111)
Creatinine, Ser: 0.8 mg/dL (ref 0.61–1.24)
GFR, Estimated: >60 mL/min
Glucose, Bld: 127 mg/dL — ABNORMAL HIGH (ref 70–99)
Potassium: 4.5 mmol/L (ref 3.5–5.1)
Sodium: 138 mmol/L (ref 135–145)
Total Bilirubin: 0.4 mg/dL (ref 0.0–1.2)
Total Protein: 6 g/dL — ABNORMAL LOW (ref 6.5–8.1)

## 2024-06-04 LAB — CBC
HCT: 22.8 % — ABNORMAL LOW (ref 39.0–52.0)
Hemoglobin: 7.1 g/dL — ABNORMAL LOW (ref 13.0–17.0)
MCH: 32.6 pg (ref 26.0–34.0)
MCHC: 31.1 g/dL (ref 30.0–36.0)
MCV: 104.6 fL — ABNORMAL HIGH (ref 80.0–100.0)
Platelets: 420 K/uL — ABNORMAL HIGH (ref 150–400)
RBC: 2.18 MIL/uL — ABNORMAL LOW (ref 4.22–5.81)
RDW: 16.8 % — ABNORMAL HIGH (ref 11.5–15.5)
WBC: 13.8 K/uL — ABNORMAL HIGH (ref 4.0–10.5)
nRBC: 0.3 % — ABNORMAL HIGH (ref 0.0–0.2)

## 2024-06-04 LAB — PREPARE RBC (CROSSMATCH)

## 2024-06-04 LAB — PROTIME-INR
INR: 3.3 — ABNORMAL HIGH (ref 0.8–1.2)
Prothrombin Time: 35.2 s — ABNORMAL HIGH (ref 11.4–15.2)

## 2024-06-04 LAB — TROPONIN T, HIGH SENSITIVITY
Troponin T High Sensitivity: 29 ng/L — ABNORMAL HIGH (ref 0–19)
Troponin T High Sensitivity: 28 ng/L — ABNORMAL HIGH (ref 0–19)

## 2024-06-04 MED ORDER — ACETAMINOPHEN 500 MG PO TABS
1000.0000 mg | ORAL_TABLET | Freq: Three times a day (TID) | ORAL | Status: DC
  Administered 2024-06-04 – 2024-06-14 (×8): 1000 mg via ORAL
  Filled 2024-06-04 (×15): qty 2

## 2024-06-04 MED ORDER — PANTOPRAZOLE SODIUM 40 MG PO TBEC
40.0000 mg | DELAYED_RELEASE_TABLET | Freq: Every day | ORAL | Status: DC
  Administered 2024-06-06 – 2024-06-16 (×11): 40 mg via ORAL
  Filled 2024-06-04 (×12): qty 1

## 2024-06-04 MED ORDER — LIDOCAINE 5 % EX PTCH
1.0000 | MEDICATED_PATCH | CUTANEOUS | Status: DC
  Filled 2024-06-04 (×12): qty 1

## 2024-06-04 MED ORDER — WARFARIN - PHARMACIST DOSING INPATIENT: Freq: Every day | Status: DC

## 2024-06-04 MED ORDER — METOPROLOL SUCCINATE ER 50 MG PO TB24
50.0000 mg | ORAL_TABLET | Freq: Every day | ORAL | Status: DC
  Administered 2024-06-04: 50 mg via ORAL
  Filled 2024-06-04: qty 1

## 2024-06-04 MED ORDER — SODIUM CHLORIDE 0.9% IV SOLUTION: Freq: Once | INTRAVENOUS | Status: AC

## 2024-06-04 MED ORDER — OXYCODONE HCL 5 MG PO TABS: 5.0000 mg | ORAL_TABLET | Freq: Four times a day (QID) | ORAL | Status: DC | PRN

## 2024-06-04 MED ORDER — SODIUM CHLORIDE 0.9% IV SOLUTION: Freq: Once | INTRAVENOUS | Status: DC

## 2024-06-04 MED ORDER — DORZOLAMIDE HCL-TIMOLOL MAL 2-0.5 % OP SOLN
1.0000 [drp] | Freq: Two times a day (BID) | OPHTHALMIC | Status: DC
  Administered 2024-06-04 – 2024-06-16 (×23): 1 [drp] via OPHTHALMIC
  Filled 2024-06-04: qty 10

## 2024-06-04 MED ORDER — POLYETHYLENE GLYCOL 3350 17 G PO PACK
17.0000 g | PACK | Freq: Every day | ORAL | Status: DC
  Administered 2024-06-07: 17 g via ORAL
  Filled 2024-06-04 (×2): qty 1

## 2024-06-04 MED ORDER — ADULT MULTIVITAMIN W/MINERALS CH
1.0000 | ORAL_TABLET | Freq: Every day | ORAL | Status: DC
  Administered 2024-06-06 – 2024-06-16 (×11): 1 via ORAL
  Filled 2024-06-04 (×11): qty 1

## 2024-06-04 MED ORDER — TAFLUPROST (PF) 0.0015 % OP SOLN
1.0000 [drp] | Freq: Every day | OPHTHALMIC | Status: DC
  Administered 2024-06-14 – 2024-06-15 (×2): 1 [drp] via OPHTHALMIC
  Filled 2024-06-04 (×2): qty 5
  Filled 2024-06-04: qty 1

## 2024-06-04 MED ORDER — ROSUVASTATIN CALCIUM 20 MG PO TABS
20.0000 mg | ORAL_TABLET | Freq: Every day | ORAL | Status: DC
  Administered 2024-06-04 – 2024-06-15 (×12): 20 mg via ORAL
  Filled 2024-06-04 (×12): qty 1

## 2024-06-04 NOTE — Consult Note (Signed)
" °  CLINICAL SUPPORT TEAM - WOUND OSTOMY AND CONTINENCE TEAM  CONSULTATION SERVICES   WOC Nurse-Inpatient Note   WOC Nurse Consult Note: Reason for Consult: Requested by phone dressing recommendation on a skin graft. Wound type: surgical. Pressure Injury POA: NA  Measurement: see nursing flow sheet. Wound bed: 100% red, skin graft. Hematoma. Drainage (amount, consistency, odor) Copious blood drainage, syncope episode. Still bleeding when I visited the patient. Applied a non-adherent dressing, non-adherent pad, and wrapped with Coban as a tourniquet to stop the bleeding. Does not need to removed the Mepitel in the bleeding area when remove the Coban and pad. Periwound: intact, hematoma. Dressing procedure/placement/frequency: Cleanse with Vashe TI#[id number], not rinse. Apply Mepilex TI#[id number] to the skin graft/wound bed, top with non adherent pad and wrap with Kerlix. Change daily.  I gave the instruction to the bed nurse.  WOC team will not plan to follow further. Please reconsult if further assistance is needed. Thank-you,  Lela Holm MSN, RN, CWCN, CNS.  (Phone [phone])        "

## 2024-06-04 NOTE — ED Triage Notes (Signed)
 Pt bib EMS from [facility]. DC yesterday after week stay. Hematoma L leg that was drained. Bleeding through ace wrap. Coumadin  being taken. Syncopal episode weak lethargic and pale. Iron levels were trending down. Transfusion a week ago. 106/63 BP 90 HR 92% RA 18 RR A&Ox4.

## 2024-06-04 NOTE — Progress Notes (Signed)
 PHARMACY - ANTICOAGULATION CONSULT NOTE  Pharmacy Consult for warfarin Indication: mech MVR   Patient Measurements: Height: 5' 11 (180.3 cm) Weight: 85 kg (187 lb 6.3 oz) IBW/kg (Calculated) : 75.3 HEPARIN  DW (KG): 85  Vital Signs: Temp: 97.6 F (36.4 C) (01/23 1358) Temp Source: Oral (01/23 1401) BP: 112/48 (01/23 1358) Pulse Rate: 96 (01/23 1401)  Labs: Recent Labs    06/02/24 0512 06/03/24 0512 06/04/24 1208  HGB 7.9* 7.4* 7.1*  HCT 24.2* 23.2* 22.8*  PLT 441* 408* 420*  LABPROT 24.8* 24.8* 35.2*  INR 2.1* 2.1* 3.3*  HEPARINUNFRC 0.45 0.33  --   CREATININE  --  0.84 0.80    Estimated Creatinine Clearance: 70.6 mL/min (by C-G formula based on SCr of 0.8 mg/dL).  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 Warfarin reversed with vitamin K po 2.5mg  x1  05/20/24. Patient discharged from [facility] 1/22 to SNF and came back to ER today with significant bleeding through dressing  Home warfarin regimen = 2.5 mg Mon, Wed, Fri and 5 mg Tues, Thur, Sat, Sun  > but plan when discharged yesterday was to continue warfarin dose at higher end of dosing - 5mg  daily until follow up with [facility] clinic or at [facility] and full dose Lovenox  until INR > 2.5   INR 3.3 today Hgb 7.1  Goal of Therapy:  INR 2.5-3.5 Monitor platelets by anticoagulation protocol: Yes   Plan:   No warfarin today per Md due to continued bleeding  Check INR daily while on warfarin  Continue to monitor H&H and platelets   Eva CHRISTELLA Allis, PharmD, BCPS Secure Chat if ?s 06/04/2024 5:16 PM

## 2024-06-04 NOTE — H&P (Signed)
 " History and Physical    Terry [name] FMW:[id number] DOB: [birth year] DOA: 06/04/2024  PCP: Terry Anes, MD  Patient coming from: Skilled nursing facility  I have personally briefly reviewed patient's old medical records available.   Chief Complaint: Bleeding through the dressing, feels weak and tired.  Discharged from hospital yesterday.    HPI: Terry [name] is a 88 y.o. male with medical history significant of mitral valve replacement on Coumadin , hypertension, hyperlipidemia and BPH who had suffered from a traumatic hematoma left leg followed by compartment syndrome and was admitted to the hospital, multiple debridement, wound VAC placement, 1 unit of blood transfusion and then discharged to a skilled nursing facility yesterday after achieving therapeutic INR and also on Lovenox  and Coumadin  comes back today from SNF where he felt weak while transferring from bed to chair and began to feel faint as if he may pass out.  He does have recent poor appetite but denies any other complaints.  He has intermittent bleeding into his dressing and that has been ongoing since last 2 weeks. ED Course: Blood pressures are stable.  On room air.  Hemoglobin 7.1 with recent hemoglobin of 7.4 yesterday.  INR 3.3 on Coumadin  5 mg daily.  Dressing was changed by ER staff, saturated with blood and small hematoma on the superior aspect.  No brisk bleeding.  Patient was  started on 1 unit PRBC transfusion and admission requested.  Patient has ongoing oozing from the left leg wound, case was discussed with Dr. Harden who recommended admit to [facility] and he will consult.  Does not recommend further procedure.   Review of Systems: all systems are reviewed and pertinent positive as per HPI otherwise rest are negative.    Past Medical History:  Diagnosis Date   Benign prostatic hypertrophy    Dyslipidemia    Hyperlipidemia    Long-term (current) use of anticoagulants    Mitral valve prolapse     Osteoporosis    Ventricular tachycardia [facility])     Past Surgical History:  Procedure Laterality Date   APPENDICO-VESICOSTOMY CUTANEOUS     CARDIAC CATHETERIZATION  08/19/2001   Essentially smooth and normal coronary arteries   CORONARY STENT PLACEMENT  11/03/2001   Mitral valve replacement with #35 [employer] mechanical mitral valve prosthesis and closure of  left atrial appendage   INCISION AND DRAINAGE OF DEEP ABSCESS, CALF Left 05/26/2024   Procedure: INCISION AND DRAINAGE OF  LEFT LEG CALF EXCISIONAL DEBRIDEMENT;  Surgeon: Harden Jerona GAILS, MD;  Location: [facility] OR;  Service: Orthopedics;  Laterality: Left;  LEFT LEG EXCISIONAL DEBRIDEMENT   INCISION AND DRAINAGE OF DEEP ABSCESS, CALF Left 05/28/2024   Procedure: INCISION AND DRAINAGE OF DEEP ABSCESS, CALF;  Surgeon: Harden Jerona GAILS, MD;  Location: [facility] OR;  Service: Orthopedics;  Laterality: Left;  Excisional Debridement Left Leg    Social history   reports that he quit smoking about 60 years ago. His smoking use included cigarettes. He has never used smokeless tobacco. He reports that he does not drink alcohol and does not use drugs.  Allergies[1]  History reviewed. No pertinent family history.   Prior to Admission medications  Medication Sig Start Date End Date Taking? Authorizing Provider  acetaminophen  (TYLENOL ) 500 MG tablet Take 2 tablets (1,000 mg total) by mouth every 8 (eight) hours. 06/03/24   Cheryle Page, MD  dorzolamide -timolol  (COSOPT ) 2-0.5 % ophthalmic solution Place 1 drop into both eyes 2 (two) times daily.    [provider]  enoxaparin  (LOVENOX ) 100 MG/ML injection Inject 0.85 mLs (85 mg total) into the skin every 12 (twelve) hours. 06/03/24   Cheryle Page, MD  lidocaine  (LIDODERM ) 5 % Place 1 patch onto the skin daily. Remove & Discard patch within 12 hours or as directed by MD 06/03/24   Cheryle Page, MD  metoprolol  succinate (TOPROL -XL) 50 MG 24 hr tablet Take 50 mg by mouth daily.    [provider]   multivitamin [facility]) per tablet Take 1 tablet by mouth daily.    [provider]  omeprazole (PRILOSEC) 20 MG capsule Take 20 mg by mouth every other day.    [provider]  oxyCODONE  (OXY IR/ROXICODONE ) 5 MG immediate release tablet Take 1 tablet (5 mg total) by mouth every 6 (six) hours as needed for severe pain (pain score 7-10). 06/03/24   Cheryle Page, MD  polyethylene glycol (MIRALAX ) 17 g packet Take 17 g by mouth daily. 06/03/24   Cheryle Page, MD  rosuvastatin  (CRESTOR ) 20 MG tablet Take 20 mg by mouth daily.    [provider]  Tafluprost , PF, 0.0015 % SOLN Place 1 drop into both eyes at bedtime.    [provider]  warfarin (COUMADIN ) 5 MG tablet Take 1 tablet (5 mg total) by mouth daily at 4 PM. 06/03/24   Cheryle Page, MD    Physical Exam: Vitals:   06/04/24 1351 06/04/24 1355 06/04/24 1358 06/04/24 1401  BP:   (!) 112/48   Pulse: 93 93  96  Resp: 12 16  18   Temp:   97.6 F (36.4 C)   TempSrc:    Oral  SpO2: 100% 99%  100%  Weight:      Height:        Constitutional: NAD, calm, comfortable.  Pale looking. Vitals:   06/04/24 1351 06/04/24 1355 06/04/24 1358 06/04/24 1401  BP:   (!) 112/48   Pulse: 93 93  96  Resp: 12 16  18   Temp:   97.6 F (36.4 C)   TempSrc:    Oral  SpO2: 100% 99%  100%  Weight:      Height:       Eyes: PERRL, lids and conjunctivae normal ENMT: Mucous membranes are moist. Posterior pharynx clear of any exudate or lesions.Normal dentition.  Neck: normal, supple, no masses, no thyromegaly Respiratory: clear to auscultation bilaterally, no wheezing, no crackles. Normal respiratory effort. No accessory muscle use.  Cardiovascular: Regular rate and rhythm, no murmurs / rubs / gallops.  Mechanical heart sound present.  No extremity edema. 2+ pedal pulses. No carotid bruits.  Abdomen: no tenderness, no masses palpated. No hepatosplenomegaly. Bowel sounds positive.  Neurologic: CN 2-12 grossly intact.  Sensation intact, DTR normal. Strength 5/5 in all 4.  Psychiatric: Normal judgment and insight. Alert and oriented x 3. Normal mood.  Musculoskeletal: no clubbing / cyanosis.  Large open wound left lateral leg with a skin graft, hematoma underneath the graft, oozing present.  Picture in the chart.   Patient already has oozing through the dressing   Labs on Admission: I have personally reviewed following labs and imaging studies  CBC: Recent Labs  Lab 05/31/24 0428 06/01/24 0505 06/01/24 1439 06/01/24 2017 06/02/24 0512 06/03/24 0512 06/04/24 1208  WBC 8.5 9.4  --   --  8.7 9.2 13.8*  HGB 8.6* 8.1* 7.5* 7.6* 7.9* 7.4* 7.1*  HCT 26.8* 25.1* 23.8* 24.3* 24.2* 23.2* 22.8*  MCV 99.6 100.0  --   --  101.3* 101.3* 104.6*  PLT 489* 466*  --   --  441* 408* 420*   Basic Metabolic Panel: Recent Labs  Lab 06/01/24 0505 06/03/24 0512 06/04/24 1208  NA 136 136 138  K 4.0 4.0 4.5  CL 105 105 106  CO2 23 24 25   GLUCOSE 104* 97 127*  BUN 17 23 20   CREATININE 0.84 0.84 0.80  CALCIUM  8.1* 8.3* 8.7*  MG  --  2.2  --    GFR: Estimated Creatinine Clearance: 70.6 mL/min (by C-G formula based on SCr of 0.8 mg/dL). Liver Function Tests: Recent Labs  Lab 06/01/24 0505 06/04/24 1208  AST 28 25  ALT 33 33  ALKPHOS 57 58  BILITOT 0.4 0.4  PROT 5.5* 6.0*  ALBUMIN 2.9* 3.3*   No results for input(s): LIPASE, AMYLASE in the last 168 hours. No results for input(s): AMMONIA in the last 168 hours. Coagulation Profile: Recent Labs  Lab 05/31/24 0428 06/01/24 0505 06/02/24 0512 06/03/24 0512 06/04/24 1208  INR 1.5* 1.8* 2.1* 2.1* 3.3*   Cardiac Enzymes: No results for input(s): CKTOTAL, CKMB, CKMBINDEX, TROPONINI in the last 168 hours. BNP (last 3 results) No results for input(s): PROBNP in the last 8760 hours. HbA1C: No results for input(s): HGBA1C in the last 72 hours. CBG: No results for input(s): GLUCAP in the last 168 hours. Lipid Profile: No results  for input(s): CHOL, HDL, LDLCALC, TRIG, CHOLHDL, LDLDIRECT in the last 72 hours. Thyroid Function Tests: No results for input(s): TSH, T4TOTAL, FREET4, T3FREE, THYROIDAB in the last 72 hours. Anemia Panel: No results for input(s): VITAMINB12, FOLATE, FERRITIN, TIBC, IRON, RETICCTPCT in the last 72 hours. Urine analysis: No results found for: COLORURINE, APPEARANCEUR, LABSPEC, PHURINE, GLUCOSEU, HGBUR, BILIRUBINUR, KETONESUR, PROTEINUR, UROBILINOGEN, NITRITE, LEUKOCYTESUR  Radiological Exams on Admission: No results found.  EKG: Independently reviewed.  Sinus rhythm.  No ST-T wave changes.  Assessment/Plan Principal Problem:   Acute posthemorrhagic anemia Active Problems:   H/O mitral valve replacement with mechanical valve   Chronic anticoagulation   Essential hypertension   Symptomatic anemia     1.  Acute posthemorrhagic anemia, symptomatic blood loss anemia in a patient with chronic anemia, coagulopathy on Coumadin . Admit.   Started on 1 unit of PRBC, patient has ongoing bleeding.  Will transfuse another unit.  Total 2 units tonight.   Close monitoring every 12 hours.  Challenging situation because of patient in need of therapeutic Coumadin  levels.  May support with ongoing transfusion.  2.  History of mitral valve replacement with mechanical valve on chronic anticoagulation with Coumadin : Goal INR 2-3.  INR is 3.3 today.  Hold Coumadin  tonight.  Redose tomorrow morning.  Patient does remain high risk of thromboembolism if interruption in anticoagulation therapy so will need balanced approach with ongoing Coumadin  redosing.  Does not need further Lovenox .  3.  Essential hypertension the blood pressure is stable.  At risk of hypotension.  Holding antihypertensives today.  4.  Traumatic left leg hematoma, status post compartment syndrome and skin grafting with ongoing bleeding: Supportive care.  Discussing with Dr. Harden who  recommended conservative management.  He will see the patient.  Do not anticipate any surgery at this time. Reinforce dressing without removing tonight.  5.  Hyperlipidemia: On Crestor .    DVT prophylaxis: Therapeutic on Coumadin  Code Status: Full code Family Communication: None at the bedside Disposition Plan: Back to his SNF for rehab when he stabilized.  Consult PT OT. Consults called: Orthopedics, Dr. Harden Admission status: Inpatient due to significant symptoms, close monitoring  needed   Renato Applebaum MD [facility]       [1]  Allergies Allergen Reactions   Alendronate Other (See Comments)    GERD   Aspirin     too much bruising when added to Coumadin    Cipro [Ciprofloxacin Hcl] Other (See Comments)    Heartburn    Sulfa Drugs Cross Reactors Hives   "

## 2024-06-04 NOTE — ED Provider Notes (Addendum)
 " [place] EMERGENCY DEPARTMENT AT [facility] Provider Note   CSN: [id number] Arrival date & time: 06/04/24  1106     Patient presents with: Near Syncope   Terry [name] is a 89 y.o. male.   Patient with c/o generally feeling weak/faint today. Was being transferred from bed to chair and began to feel faint as if may pass out.  Recent inpatient admission with hematoma to left lower leg, had surgery for same, and was found to be anemic during hospital stay. Current dressing is saturated with bloody drainage.  Denies other abnormal bruising or bleeding, no rectal bleeding or melena. Is on coumadin , hx mitral valve replacement. Denies headache. No chest pain or discomfort. No sob. No cough or uri symptoms. No fever or chills. No abd pain or nvd. + recent relatively poor po intake, poor appetite. No dysuria or gu c/o. No focal extremity pain/swelling. Denies worsening pain to left leg.   The history is provided by the patient, medical records and the EMS personnel. The history is limited by the condition of the patient.  Near Syncope Pertinent negatives include no chest pain, no abdominal pain, no headaches and no shortness of breath.       Prior to Admission medications  Medication Sig Start Date End Date Taking? Authorizing Provider  acetaminophen  (TYLENOL ) 500 MG tablet Take 2 tablets (1,000 mg total) by mouth every 8 (eight) hours. 06/03/24   Cheryle Page, MD  dorzolamide -timolol  (COSOPT ) 2-0.5 % ophthalmic solution Place 1 drop into both eyes 2 (two) times daily.    [provider]  enoxaparin  (LOVENOX ) 100 MG/ML injection Inject 0.85 mLs (85 mg total) into the skin every 12 (twelve) hours. 06/03/24   Cheryle Page, MD  lidocaine  (LIDODERM ) 5 % Place 1 patch onto the skin daily. Remove & Discard patch within 12 hours or as directed by MD 06/03/24   Cheryle Page, MD  metoprolol  succinate (TOPROL -XL) 50 MG 24 hr tablet Take 50 mg by mouth daily.    [provider]  multivitamin [facility]) per tablet Take 1 tablet by mouth daily.    [provider]  omeprazole (PRILOSEC) 20 MG capsule Take 20 mg by mouth every other day.    [provider]  oxyCODONE  (OXY IR/ROXICODONE ) 5 MG immediate release tablet Take 1 tablet (5 mg total) by mouth every 6 (six) hours as needed for severe pain (pain score 7-10). 06/03/24   Cheryle Page, MD  polyethylene glycol (MIRALAX ) 17 g packet Take 17 g by mouth daily. 06/03/24   Cheryle Page, MD  rosuvastatin  (CRESTOR ) 20 MG tablet Take 20 mg by mouth daily.    [provider]  Tafluprost , PF, 0.0015 % SOLN Place 1 drop into both eyes at bedtime.    [provider]  warfarin (COUMADIN ) 5 MG tablet Take 1 tablet (5 mg total) by mouth daily at 4 PM. 06/03/24   Cheryle Page, MD    Allergies: Alendronate, Aspirin, Cipro [ciprofloxacin hcl], and Sulfa drugs cross reactors    Review of Systems  Constitutional:  Negative for chills, diaphoresis and fever.  HENT:  Negative for sore throat.   Eyes:  Negative for visual disturbance.  Respiratory:  Negative for cough and shortness of breath.   Cardiovascular:  Positive for near-syncope. Negative for chest pain and palpitations.  Gastrointestinal:  Negative for abdominal pain, blood in stool, diarrhea and vomiting.  Genitourinary:  Negative for dysuria, flank pain and hematuria.  Musculoskeletal:  Negative for back pain  and neck pain.  Neurological:  Positive for light-headedness. Negative for speech difficulty, weakness, numbness and headaches.    Updated Vital Signs BP (!) 112/48   Pulse 96   Temp 97.6 F (36.4 C)   Resp 18   Ht 1.803 m (5' 11)   Wt 85 kg   SpO2 100%   BMI 26.14 kg/m   Physical Exam Vitals and nursing note reviewed.  Constitutional:      Appearance: Normal appearance. He is well-developed.  HENT:     Head: Atraumatic.     Nose: Nose normal.     Mouth/Throat:     Mouth: Mucous membranes are moist.      Pharynx: Oropharynx is clear.  Eyes:     General: No scleral icterus.    Pupils: Pupils are equal, round, and reactive to light.  Neck:     Trachea: No tracheal deviation.  Cardiovascular:     Rate and Rhythm: Normal rate and regular rhythm.     Pulses: Normal pulses.     Heart sounds: Normal heart sounds. No murmur heard.    No friction rub. No gallop.  Pulmonary:     Effort: Pulmonary effort is normal. No accessory muscle usage or respiratory distress.     Breath sounds: Normal breath sounds.  Abdominal:     General: Bowel sounds are normal. There is no distension.     Palpations: Abdomen is soft. There is no mass.     Tenderness: There is no abdominal tenderness. There is no guarding.  Genitourinary:    Comments: No cva tenderness. Musculoskeletal:        General: No swelling.     Cervical back: Normal range of motion and neck supple. No rigidity.     Comments: Pts dressing to LLE, including gauze/kerlix wrap is saturated with blood. There is a hematoma towards superior aspect of wound which has ruptured/~1.5 cm opening, and drained dark blood onto dressing. Foot is of normal color and warmth with intact distal pulses. Compartments of LLE are soft, not tense.   Skin:    General: Skin is warm and dry.     Findings: No rash.  Neurological:     Mental Status: He is alert.     Comments: Alert, speech clear. Motor/sens grossly intact bil. LLE is nvi, with intact motor/sens fxn.   Psychiatric:        Mood and Affect: Mood normal.     (all labs ordered are listed, but only abnormal results are displayed) Results for orders placed or performed during the hospital encounter of 06/04/24  Comprehensive metabolic panel   Collection Time: 06/04/24 12:08 PM  Result Value Ref Range   Sodium 138 135 - 145 mmol/L   Potassium 4.5 3.5 - 5.1 mmol/L   Chloride 106 98 - 111 mmol/L   CO2 25 22 - 32 mmol/L   Glucose, Bld 127 (H) 70 - 99 mg/dL   BUN 20 8 - 23 mg/dL   Creatinine, Ser 9.19  0.61 - 1.24 mg/dL   Calcium  8.7 (L) 8.9 - 10.3 mg/dL   Total Protein 6.0 (L) 6.5 - 8.1 g/dL   Albumin 3.3 (L) 3.5 - 5.0 g/dL   AST 25 15 - 41 U/L   ALT 33 0 - 44 U/L   Alkaline Phosphatase 58 38 - 126 U/L   Total Bilirubin 0.4 0.0 - 1.2 mg/dL   GFR, Estimated >39 >39 mL/min   Anion gap 8 5 - 15  CBC  Collection Time: 06/04/24 12:08 PM  Result Value Ref Range   WBC 13.8 (H) 4.0 - 10.5 K/uL   RBC 2.18 (L) 4.22 - 5.81 MIL/uL   Hemoglobin 7.1 (L) 13.0 - 17.0 g/dL   HCT 77.1 (L) 60.9 - 47.9 %   MCV 104.6 (H) 80.0 - 100.0 fL   MCH 32.6 26.0 - 34.0 pg   MCHC 31.1 30.0 - 36.0 g/dL   RDW 83.1 (H) 88.4 - 84.4 %   Platelets 420 (H) 150 - 400 K/uL   nRBC 0.3 (H) 0.0 - 0.2 %  Protime-INR - (order if Patient is taking Coumadin  / Warfarin)   Collection Time: 06/04/24 12:08 PM  Result Value Ref Range   Prothrombin Time 35.2 (H) 11.4 - 15.2 seconds   INR 3.3 (H) 0.8 - 1.2  Type and screen [facility]   Collection Time: 06/04/24 12:08 PM  Result Value Ref Range   ABO/RH(D) A POS    Antibody Screen NEG    Sample Expiration 06/07/2024,2359    Unit Number [id number]    Blood Component Type RED CELLS,LR    Unit division 00    Status of Unit ISSUED    Transfusion Status OK TO TRANSFUSE    Crossmatch Result      Compatible Performed at [facility], [address] [place], [place] [address]   [facility]   Collection Time: 06/04/24 12:08 PM  Result Value Ref Range   ISSUE DATE / TIME [id number]    Blood Product Unit Number [id number]    PRODUCT CODE [id number]    Unit Type and Rh 6200    Blood Product Expiration Date 797397817640   Troponin T, High Sensitivity   Collection Time: 06/04/24 12:08 PM  Result Value Ref Range   Troponin T High Sensitivity 29 (H) 0 - 19 ng/L  Prepare RBC (crossmatch)   Collection Time: 06/04/24 12:49 PM  Result Value Ref Range   Order Confirmation      ORDER PROCESSED BY BLOOD BANK Performed at [facility], [address] [place], [place] [address]    CT ANGIO LOWER EXT BILAT W &/OR WO CONTRAST Result Date: 05/23/2024 EXAM: CTA BILATERAL LOWER EXTREMITY 05/23/2024 08:19:00 PM TECHNIQUE: Contrast-enhanced computed tomography angiography of the lower extremity was performed with multiplanar reconstructions. Maximum intensity projection images were created on a separate workstation and reviewed. Automated exposure control, iterative reconstruction, and/or weight based adjustment of the mA/kV was utilized to reduce the radiation dose to as low as reasonably achievable. 100mL iohexol  (OMNIPAQUE ) 350 MG/ML injection was administered. COMPARISON: None available. CLINICAL HISTORY: Claudication or leg ischemia. FINDINGS: ARTERIAL: Limitations: Distal abdominal aorta, common iliac arteries, and proximal internal and external iliac arteries are excluded from view. Visualized internal iliac arteries are widely patent bilaterally. Right Lower Extremity: RIGHT COMMON ILIAC ARTERY: Not visualized. RIGHT EXTERNAL ILIAC ARTERY: Proximal portion not visualized. Visualized portions of the right external iliac artery are widely patent. COMMON FEMORAL ARTERY: Widely patent. SUPERFICIAL FEMORAL ARTERY: Widely patent. POPLITEAL ARTERY: Widely patent. TIBIOPERONEAL TRUNK: Widely patent. ANTERIOR TIBIAL ARTERY: Widely patent. Flow is identified in the anterior tibial artery to the ankle. The right dorsalis pedis artery is diminutive and patency is not confirmed on this examination. PERONEAL ARTERY: Widely patent. Flow is identified to the ankle. POSTERIOR TIBIAL ARTERY: Widely patent. Posterior tibial artery flow is present into the hindfoot. The plantar arch is patent. Left Lower Extremity: LEFT COMMON ILIAC ARTERY: Not visualized. LEFT EXTERNAL ILIAC ARTERY:  Proximal portion not visualized. Visualized portions of the left external iliac artery are widely patent. COMMON FEMORAL ARTERY: Widely patent. SUPERFICIAL  FEMORAL ARTERY: Widely patent. POPLITEAL ARTERY: Widely patent. TIBIOPERONEAL TRUNK: Widely patent. ANTERIOR TIBIAL ARTERY: Widely patent. Flow is identified in the anterior tibial artery to the ankle. Dorsalis pedis is patent. PERONEAL ARTERY: Widely patent. Flow is identified to the ankle. POSTERIOR TIBIAL ARTERY: Widely patent. Posterior tibial artery flow is present into the hindfoot. Plantar arch is patent. BONES AND SOFT TISSUES: Mild subcutaneous edema noted within the posterolateral subcutaneous soft tissues of the left thigh in keeping with superficial contusion. Edema is seen within the posterior muscular compartment surrounding the flexor musculature and popliteal vasculature without mass-forming hematoma identified in this region. Within the infrapopliteal left lower extremity, there are subcutaneous hematoma seen anterolaterally measuring 2.6 x 7.2 x 14.9 cm (volume 144.5 cm\S\3) and posterolaterally measuring 4.2 x 10.2 x 17.5 cm (volume 388.9 cm\S\3). Hematoma demonstrate marked mass effect upon the superficial posterior and anterior compartment musculature but remained confined to the subcutaneous compartment. Moderate subcutaneous edema noted within the peripheral left lower extremity. Moderate right prepatellar subcutaneous edema. INCIDENTAL ABDOMINAL FINDINGS: Moderate sigmoid diverticulosis. Marked prostatic hypertrophy. IMPRESSION: 1. Right lower extremity arterial inflow is widely patent. 2. Right lower extremity arterial outflow is widely patent. 3. Right lower extremity runoff demonstrates 3-vessel runoff to the ankle with diminutive dorsalis pedis artery and patency not confirmed, and a patent plantar arch. 4. Left lower extremity arterial inflow is widely patent. 5. Left lower extremity arterial outflow is widely patent. 6. Left lower extremity runoff demonstrates 3-vessel runoff to the ankle with patent dorsalis pedis artery and plantar arch, with large infrapopliteal subcutaneous  hematomas and associated left lower extremity edema/contusion as described, with moderate sigmoid diverticulosis and marked prostatic hypertrophy. 7. Multiple hematomas within the left lower extremity as outlined above demonstrating significant mass effect upon the subjacent musculature of the superficial posterior and anterior compartments. Electronically signed by: Dorethia Molt MD MD 05/23/2024 08:53 PM EST RP Workstation: [id number]   CT Angio Aortobifemoral W and/or Wo Contrast Result Date: 05/20/2024 EXAM: CTA ABDOMEN AND PELVIS WITH CONTRAST AND RUNOFF CTA OF THE LOWER EXTREMITIES WITH CONTRAST 05/20/2024 05:13:59 PM TECHNIQUE: CTA images of the abdomen, pelvis and lower extremities with intravenous contrast. 100 mL of iohexol  (OMNIPAQUE ) 350 MG/ML injection was administered. Three-dimensional MIP/volume rendered formations were performed. Automated exposure control, iterative reconstruction, and/or weight based adjustment of the mA/kV was utilized to reduce the radiation dose to as low as reasonably achievable. COMPARISON: CT virtual colonoscopy 11/01/2021. CLINICAL HISTORY: Claudication or leg ischemia; eval hematoma, ?active extrav LLE hematoma, abscess. FINDINGS: VASCULATURE: AORTA: Atherosclerotic calcifications of the aorta. No acute finding. No abdominal aortic aneurysm. No dissection. CELIAC TRUNK: No acute finding. No occlusion or significant stenosis. SUPERIOR MESENTERIC ARTERY: No acute finding. No occlusion or significant stenosis. INFERIOR MESENTERIC ARTERY: No acute finding. No occlusion or significant stenosis. RENAL ARTERIES: No acute finding. No occlusion or significant stenosis. RIGHT ILIAC ARTERIES: Atherosclerotic calcifications of the common iliac arteries. No acute finding. No occlusion or significant stenosis. RIGHT FEMORAL ARTERIES: Atherosclerotic calcifications of the superficial femoral artery. No acute finding. No occlusion or significant stenosis. RIGHT POPLITEAL ARTERY: No  acute finding. No occlusion or significant stenosis. RIGHT CALF ARTERIES: Atherosclerotic calcifications of calf vessels. No acute finding. No occlusion or significant stenosis. LEFT ILIAC ARTERIES: Atherosclerotic calcifications of the common iliac arteries. No acute finding. No occlusion or significant stenosis. LEFT FEMORAL ARTERIES: Atherosclerotic calcifications of the superficial  femoral artery. No acute finding. No occlusion or significant stenosis. LEFT POPLITEAL ARTERY: No acute finding. No occlusion or significant stenosis. LEFT CALF ARTERIES: Atherosclerotic calcifications of calf vessels. No acute finding. No occlusion or significant stenosis. ABDOMEN AND PELVIS: LOWER CHEST: There is a 6 mm right middle lobe nodule. LIVER: The liver is unremarkable. GALLBLADDER AND BILE DUCTS: Gallbladder is unremarkable. No biliary ductal dilatation. SPLEEN: The spleen is unremarkable. PANCREAS: The pancreas is unremarkable. ADRENAL GLANDS: Bilateral adrenal glands demonstrate no acute abnormality. KIDNEYS, URETERS AND BLADDER: No stones in the kidneys or ureters. No hydronephrosis. No evidence of perinephric or periureteral stranding. Urinary bladder is unremarkable. GI AND Bowel: Stomach and duodenal sweep demonstrate no acute abnormality. There is sigmoid colon diverticulosis. The appendix is not visualized. There is no bowel obstruction. No abnormal bowel wall thickening or distension. REPRODUCTIVE: Prostate gland is enlarged measuring 6.9 cm in transverse dimension. PERITONEUM AND RETROPERITONEUM: No ascites or free air. LYMPH NODES: Mildly enlarged left inguinal lymph nodes measuring up to 11 mm. BONES AND SOFT TISSUES: There is edema throughout the bilateral lower extremities below the level of the knees including feet and ankles, greater on the left. There is a subcutaneous collection anterior to the mid tibia measuring 2.9 x 5.9 x 12.7 cm with a hyperdense fluid level. There is a small amount of active  extravasation identified in the mid and lower portions of the collection without a definitive feeder vessel seen. There is no soft tissue gas or foreign body identified. There is no acute osseous abnormality. IMPRESSION: 1. Left anterior mid tibial subcutaneous hematoma with a small focus of active extravasation and no definite feeder vessel identified. 2. Edema throughout the bilateral lower extremities below the level of the knees, greater on the left. 3. Atherosclerotic calcifications of the aorta, common iliac arteries, calf vessels, and superficial femoral artery bilaterally. 4. Solid 6 mm right middle lobe pulmonary nodule, recommend noncontrast chest CT at 6-12 months per Fleischner Society Guidelines. Electronically signed by: Greig Pique MD MD 05/20/2024 06:04 PM EST RP Workstation: [id number]    EKG: EKG Interpretation Date/Time:  Friday June 04 2024 11:25:32 EST Ventricular Rate:  92 PR Interval:  171 QRS Duration:  100 QT Interval:  398 QTC Calculation: 493 R Axis:   73  Text Interpretation: Sinus rhythm Non-specific ST-t changes Borderline prolonged QT interval Confirmed by Bernard Drivers ([id number]) on 06/04/2024 11:27:47 AM  Radiology: No results found.   Procedures   Medications Ordered in the ED  0.9 %  sodium chloride  infusion (Manually program via Guardrails IV Fluids) (0 mLs Intravenous Hold 06/04/24 1343)                                    Medical Decision Making Problems Addressed: Generalized weakness: acute illness or injury with systemic symptoms that poses a threat to life or bodily functions Hematoma of left lower leg: acute illness or injury    Details: Acute and subacute Near syncope: acute illness or injury with systemic symptoms that poses a threat to life or bodily functions Symptomatic anemia: acute illness or injury with systemic symptoms that poses a threat to life or bodily functions Warfarin-induced coagulopathy: acute illness or injury  Amount  and/or Complexity of Data Reviewed Independent Historian: EMS    Details: hx External Data Reviewed: notes. Labs: ordered. Decision-making details documented in ED Course. ECG/medicine tests: ordered and independent interpretation performed. Decision-making details documented in  ED Course. Discussion of management or test interpretation with external provider(s): medicine  Risk Prescription drug management. Decision regarding hospitalization.   Iv ns. Continuous pulse ox and cardiac monitoring. Labs ordered/sent.   Differential diagnosis includes symptomatic anemia, near syncope, infection, dehydration, etc. Dispo decision including potential need for admission considered - will get labs and reassess.   Reviewed nursing notes and prior charts for additional history. External reports reviewed. Additional history from: EMS.   Cardiac monitor: sinus rhythm, rate 88.  Labs reviewed/interpreted by me - wbc 13, hg 7, mildly lower than prior, pt appears to be very symptomatic - ivf, and will transfuse prbc.  INR 3.3  Given faint/syncopal when stands, hgb 7, recurrent slow oozing of blood/soiled dressings post recent discharge, will admit to medicine. Medicine team will need wound care/ Dr Harden to follow as consultants during inpatient stay.   New sterile dressing placed.   CRITICAL CARE RE: symptomatic anemia requiring emergent prbc transfusion Performed by: Hedi Barkan E Gilberte Gorley Total critical care time: 45 minutes Critical care time was exclusive of separately billable procedures and treating other patients. Critical care was necessary to treat or prevent imminent or life-threatening deterioration. Critical care was time spent personally by me on the following activities: development of treatment plan with patient and/or surrogate as well as nursing, discussions with consultants, evaluation of patient's response to treatment, examination of patient, obtaining history from patient or surrogate,  ordering and performing treatments and interventions, ordering and review of laboratory studies, ordering and review of radiographic studies, pulse oximetry and re-evaluation of patient's condition.  Consult placed to Dr Palma - hospitalists is also secure chatting.   Recheck, dressing w small amount blood since changing earlier, but only ~ 10 period saturated).   Discussed pt, hgb, inr, etc, with Dr Harden - he indicates he will see in consult, and will order dressings, indicates pt can stay at [facility], no immediate plan for any operative intervention.     Final diagnoses:  None    ED Discharge Orders     None           Bernard Drivers, MD 06/04/24 1633  "

## 2024-06-05 DIAGNOSIS — D62 Acute posthemorrhagic anemia: Secondary | ICD-10-CM | POA: Diagnosis not present

## 2024-06-05 LAB — URINALYSIS, ROUTINE W REFLEX MICROSCOPIC
Bilirubin Urine: NEGATIVE
Glucose, UA: NEGATIVE mg/dL
Ketones, ur: NEGATIVE mg/dL
Leukocytes,Ua: NEGATIVE
Nitrite: NEGATIVE
Protein, ur: 30 mg/dL — AB
RBC / HPF: >50 RBC/hpf (ref 0–5)
Specific Gravity, Urine: 1.023 (ref 1.005–1.030)
WBC, UA: >50 WBC/hpf (ref 0–5)
pH: 5 (ref 5.0–8.0)

## 2024-06-05 LAB — CBC
HCT: 24.2 % — ABNORMAL LOW (ref 39.0–52.0)
Hemoglobin: 8.2 g/dL — ABNORMAL LOW (ref 13.0–17.0)
MCH: 31.1 pg (ref 26.0–34.0)
MCHC: 33.9 g/dL (ref 30.0–36.0)
MCV: 91.7 fL (ref 80.0–100.0)
Platelets: 301 10*3/uL (ref 150–400)
RBC: 2.64 MIL/uL — ABNORMAL LOW (ref 4.22–5.81)
RDW: 18 % — ABNORMAL HIGH (ref 11.5–15.5)
WBC: 11.7 10*3/uL — ABNORMAL HIGH (ref 4.0–10.5)
nRBC: 0.7 % — ABNORMAL HIGH (ref 0.0–0.2)

## 2024-06-05 LAB — PROTIME-INR
INR: 2.9 — ABNORMAL HIGH (ref 0.8–1.2)
Prothrombin Time: 32.1 s — ABNORMAL HIGH (ref 11.4–15.2)

## 2024-06-05 LAB — HEMOGLOBIN AND HEMATOCRIT, BLOOD
HCT: 25.6 % — ABNORMAL LOW (ref 39.0–52.0)
HCT: 23 % — ABNORMAL LOW (ref 39.0–52.0)
Hemoglobin: 8.4 g/dL — ABNORMAL LOW (ref 13.0–17.0)
Hemoglobin: 7.5 g/dL — ABNORMAL LOW (ref 13.0–17.0)

## 2024-06-05 LAB — PREPARE RBC (CROSSMATCH)

## 2024-06-05 MED ORDER — METOPROLOL TARTRATE 25 MG PO TABS
25.0000 mg | ORAL_TABLET | Freq: Two times a day (BID) | ORAL | Status: DC
  Administered 2024-06-05 – 2024-06-16 (×22): 25 mg via ORAL
  Filled 2024-06-05 (×22): qty 1

## 2024-06-05 MED ORDER — ENSURE PLUS HIGH PROTEIN PO LIQD
237.0000 mL | Freq: Three times a day (TID) | ORAL | Status: DC
  Administered 2024-06-05 – 2024-06-09 (×7): 237 mL via ORAL
  Administered 2024-06-11: 330 mL via ORAL
  Administered 2024-06-11 – 2024-06-16 (×10): 237 mL via ORAL

## 2024-06-05 MED ORDER — SODIUM CHLORIDE 0.9% IV SOLUTION: Freq: Once | INTRAVENOUS | Status: AC

## 2024-06-05 MED ORDER — SODIUM CHLORIDE 0.9 % IV BOLUS
500.0000 mL | Freq: Once | INTRAVENOUS | Status: AC
  Administered 2024-06-05: 500 mL via INTRAVENOUS

## 2024-06-05 NOTE — Progress Notes (Signed)
 PT Cancellation Note  Patient Details Name: EULON ALLNUTT MRN: [id number] DOB: 1935-10-26   Cancelled Treatment:    Reason Eval/Treat Not Completed: Medical issues which prohibited therapy Pt with hypotension this morning.  Checked in with RN who feels PT should hold evaluation today.  Will check back as schedule permits.   Tari CROME [place] 06/05/2024, 2:43 PM Tari KLEIN, DPT Physical Therapist Acute Rehabilitation Services Office: [phone]

## 2024-06-05 NOTE — Progress Notes (Addendum)
 " Progress Note   Patient: Terry [name] FMW:[id number] DOB: 1935-11-06 DOA: 06/04/2024     1 DOS: the patient was seen and examined on 06/05/2024    Brief hospital course: DEZMEN ALCOCK is a 89 y.o. man with PMH of mitral valve replacement on Coumadin  (INR goal 2.5-3.5), hypertension, hyperlipidemia and BPH who had suffered from a traumatic hematoma left leg followed by compartment syndrome and was admitted to the hospital.he underwent multiple debridement, wound VAC placement, 1 unit of blood transfusion and then discharged to a skilled nursing facility.   A day after discharge, he re- presented to the ED due to weakness.  Noted to be bleeding continually from his dressing.  Hemoglobin was 7.1 (7.4 at discharge).  INR was 3.3.  Surgery was consulted.  Assessment and Plan:  Acute blood loss anemia Symptomatic blood loss anemia in a patient with chronic anemia, coagulopathy on Coumadin . Patient has received 2 units PRBCs. Challenging situation because of need for therapeutic INR in the setting of MVR. -Will continue to monitor hemoglobin closely. - Transfuse as needed.  Hypotension Patient had a hypotensive episode on 1/24. May be related to blood loss. S/p fluid bolus. - Home metoprolol  reordered as short acting, with holding parameters.  Traumatic left leg hematoma, status post compartment syndrome and skin grafting with ongoing bleeding Surgery on consult. Surgery recommending conservative management. - Continue to reinforce dressing.  History of mitral valve replacement with mechanical valve on chronic anticoagulation with Coumadin : Goal INR 2.5-3.5.  INR  3.3 on presentation INR is 2.9 today. - Will continue to hold Coumadin  for now. - If patient becomes subtherapeutic, we will start him on a heparin  drip.    Essential hypertension  On metoprolol  at home. - Metoprolol  reordered with holding parameters.   Hyperlipidemia -Continue home Crestor .     Subjective:  Patient is feeling better.  He had an episode of hypotension this morning blood pressure as low as 68/49.  He had to be put into Trendelenburg.  He received a fluid bolus.  Blood pressure stabilized.  Patient's lower extremity continues to bleed.  Physical Exam: BP (!) 112/56 (BP Location: Right Arm)   Pulse 100   Temp (!) 97.3 F (36.3 C) (Oral)   Resp 14   Ht 5' 11 (1.803 m)   Wt 85 kg   SpO2 96%   BMI 26.14 kg/m    General: Alert, oriented X3  Eyes: Pupils equal, reactive  Oral cavity: moist mucous membranes  Head: Atraumatic, normocephalic  Neck: supple  Chest: clear to auscultation. No crackles, no wheezes  CVS: S1,S2 RRR. No murmurs  Abd: No distention, soft, non-tender. No masses palpable  Extr: No edema   MSK: Left lower extremity in dressing with strikethrough noted Neurological: Grossly intact.    Data Reviewed:    Latest Ref Rng & Units 06/05/2024    9:27 AM 06/05/2024    5:08 AM 06/04/2024   12:08 PM  CBC  WBC 4.0 - 10.5 K/uL 11.7   13.8   Hemoglobin 13.0 - 17.0 g/dL 8.2  8.4  7.1   Hematocrit 39.0 - 52.0 % 24.2  25.6  22.8   Platelets 150 - 400 K/uL 301   420       Latest Ref Rng & Units 06/04/2024   12:08 PM 06/03/2024    5:12 AM 06/01/2024    5:05 AM  BMP  Glucose 70 - 99 mg/dL 872  97  895   BUN 8 - 23 mg/dL  20  23  17    Creatinine 0.61 - 1.24 mg/dL 9.19  9.15  9.15   Sodium 135 - 145 mmol/L 138  136  136   Potassium 3.5 - 5.1 mmol/L 4.5  4.0  4.0   Chloride 98 - 111 mmol/L 106  105  105   CO2 22 - 32 mmol/L 25  24  23    Calcium  8.9 - 10.3 mg/dL 8.7  8.3  8.1      Family Communication: Spoke with niece at bedside  Disposition: Status is: Inpatient Patient with ongoing bleeding, hypotension requiring more than 2 midnights  DVT prophylaxis: On systemic  anticoagulation with Coumadin .      Author: MDALA-GAUSI, Brenin Heidelberger AGATHA, MD 06/05/2024 2:12 PM  For on call review [email or web address].    "

## 2024-06-05 NOTE — Plan of Care (Signed)
" °  Problem: Education: Goal: Knowledge of General Education information will improve Description: Including pain rating scale, medication(s)/side effects and non-pharmacologic comfort measures Outcome: Progressing   Problem: Health Behavior/Discharge Planning: Goal: Ability to manage health-related needs will improve Outcome: Progressing   Problem: Clinical Measurements: Goal: Ability to maintain clinical measurements within normal limits will improve Outcome: Progressing Goal: Will remain free from infection Outcome: Progressing Goal: Diagnostic test results will improve Outcome: Progressing Goal: Respiratory complications will improve Outcome: Progressing Goal: Cardiovascular complication will be avoided Outcome: Progressing   Problem: Activity: Goal: Risk for activity intolerance will decrease Outcome: Progressing   Problem: Nutrition: Goal: Adequate nutrition will be maintained Outcome: Progressing   Problem: Coping: Goal: Level of anxiety will decrease Outcome: Progressing   Problem: Elimination: Goal: Will not experience complications related to bowel motility Outcome: Completed/Met Goal: Will not experience complications related to urinary retention Outcome: Completed/Met   Problem: Pain Managment: Goal: General experience of comfort will improve and/or be controlled Outcome: Progressing   Problem: Skin Integrity: Goal: Risk for impaired skin integrity will decrease Outcome: Progressing   "

## 2024-06-05 NOTE — Plan of Care (Signed)
" °  Problem: Pain Managment: Goal: General experience of comfort will improve and/or be controlled Outcome: Progressing   Problem: Elimination: Goal: Will not experience complications related to urinary retention Outcome: Progressing   Problem: Elimination: Goal: Will not experience complications related to bowel motility Outcome: Progressing   Problem: Coping: Goal: Level of anxiety will decrease Outcome: Progressing   "

## 2024-06-05 NOTE — Progress Notes (Signed)
 PHARMACY - ANTICOAGULATION CONSULT NOTE  Pharmacy Consult for warfarin Indication: mech MVR   Patient Measurements: Height: 5' 11 (180.3 cm) Weight: 85 kg (187 lb 6.3 oz) IBW/kg (Calculated) : 75.3 HEPARIN  DW (KG): 85  Vital Signs: Temp: 97.3 F (36.3 C) (01/24 1015) Temp Source: Oral (01/24 1015) BP: 112/56 (01/24 1015) Pulse Rate: 100 (01/24 1015)  Labs: Recent Labs    06/03/24 0512 06/04/24 1208 06/05/24 0508 06/05/24 0927  HGB 7.4* 7.1* 8.4* 8.2*  HCT 23.2* 22.8* 25.6* 24.2*  PLT 408* 420*  --  301  LABPROT 24.8* 35.2* 32.1*  --   INR 2.1* 3.3* 2.9*  --   HEPARINUNFRC 0.33  --   --   --   CREATININE 0.84 0.80  --   --     Estimated Creatinine Clearance: 70.6 mL/min (by C-G formula based on SCr of 0.8 mg/dL).  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 Warfarin reversed with vitamin K po 2.5mg  x1  05/20/24. Patient discharged from [facility] 1/22 to SNF and came back to ER today with significant bleeding through dressing  Home warfarin regimen = 2.5 mg Mon, Wed, Fri and 5 mg Tues, Thur, Sat, Sun  > but plan when discharged yesterday was to continue warfarin dose at higher end of dosing - 5mg  daily until follow up with [facility] clinic or at [facility] and full dose Lovenox  until INR > 2.5    Today, 06/05/24 INR 2.9, therapeutic  Hgb 7.1 ( 1/23)   Goal of Therapy:  INR 2.5-3.5 Monitor platelets by anticoagulation protocol: Yes   Plan:   No warfarin today per MD after discussion  due to continued bleeding, will plan to start heparin  drip on 1/25 if INR falls outside of therapeutic range   Check INR daily while on warfarin  Continue to monitor H&H and platelets    Dolphus Roller, PharmD, BCPS 06/05/2024 12:00 PM

## 2024-06-05 NOTE — Progress Notes (Signed)
 Occupational Therapy Evaluation Patient Details Name: Terry [name] MRN: [id number] DOB: 11-13-1935 Today's Date: 06/05/2024   History of Present Illness   Terry [name] is a 89 y.o. male with medical history significant of mitral valve replacement,HTN, hyperlipidemia and BPH who was in hospital 1/8-1/23 for a traumatic hematoma left leg followed by compartment syndrome and was admitted to the hospital, multiple debridement, wound VAC placement, 1 unit of blood transfusion and then discharged to a SNF. He comes back today due to feeling weak while transferring from bed to chair and began to feel faint as if he may pass out. He has intermittent bleeding into his dressing and that has been ongoing since last 2 weeks.     Clinical Impressions This 89 yo male re-admitted with above presents to acute OT with PLOF of needing increased A ever since he fell ~2 weeks ago and injured his LLE. 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 Currently due to orthostatic HTN by is setup-total A for bed level ADLs. He was able to sit up on EOB with S (at which point he got dizzy). He will continue to benefit from acute OT with follow up at  inpatient follow up therapy, <3 hours/day where he had just discharged to the day before re-admission.   Could not get a BP in sitting before pt had to lay down. Laying HOB partially up 8:54  68/49 (56), HR 85 Laying HOB flat              8:55  98/57 (70) HR 97 Trendelenberg                8:58  99/43 (68)  HR 96 Trendlenberg                  9:03  111/55 (72) HR 99 Trendelenberg                9:05  111/60 (75) HR 98 HOB flat                          9:07  112.54 (71) HR 98      If plan is discharge home, recommend the following:   A lot of help with walking and/or transfers;A lot of help with bathing/dressing/bathroom;Assistance with cooking/housework;Help with stairs or ramp for  entrance;Assist for transportation     Functional Status Assessment   Patient has had a recent decline in their functional status and demonstrates the ability to make significant improvements in function in a reasonable and predictable amount of time.     Equipment Recommendations   BSC/3in1      Precautions/Restrictions   Precautions Precautions: Fall Recall of Precautions/Restrictions: Intact Precaution/Restrictions Comments: watch orthostatics Restrictions Weight Bearing Restrictions Per Provider Order: No LLE Weight Bearing Per Provider Order: Weight bearing as tolerated     Mobility Bed Mobility Overal bed mobility: Needs Assistance Bed Mobility: Supine to Sit, Sit to Supine     Supine to sit: Supervision, HOB elevated Sit to supine: Mod assist (due to orthostatic HTN)        Transfers Overall transfer level:  (unable to attempt transfer today due to orthostatic HTN)                        Balance Overall balance assessment: Needs assistance Sitting-balance support: No upper extremity supported,  Feet supported Sitting balance-Leahy Scale: Fair         Standing balance comment: unable to attempt transfer today due to orthostatic HTN                           ADL either performed or assessed with clinical judgement   ADL Overall ADL's : Needs assistance/impaired Eating/Feeding: Independent   Grooming: Set up;Bed level Grooming Details (indicate cue type and reason): HOB up Upper Body Bathing: Set up;Bed level Upper Body Bathing Details (indicate cue type and reason): HOB up Lower Body Bathing: Moderate assistance;Bed level Lower Body Bathing Details (indicate cue type and reason): HOB up Upper Body Dressing : Set up;Bed level Upper Body Dressing Details (indicate cue type and reason): HOB up Lower Body Dressing: Total assistance;Bed level Lower Body Dressing Details (indicate cue type and reason): HOB up                General ADL Comments: unable to attempt transfer today due to orthostatic HTN     Vision Baseline Vision/History: 1 Wears glasses Ability to See in Adequate Light: 0 Adequate Patient Visual Report: No change from baseline              Pertinent Vitals/Pain Pain Assessment Pain Assessment: No/denies pain     Extremity/Trunk Assessment Upper Extremity Assessment Upper Extremity Assessment: Generalized weakness           Communication Communication Communication: Impaired Factors Affecting Communication: Hearing impaired (hearing aids)   Cognition Arousal: Alert Behavior During Therapy: WFL for tasks assessed/performed Cognition: No apparent impairments                               Following commands: Intact       Cueing   Cueing Techniques: Verbal cues              Home Living Family/patient expects to be discharged to:: Skilled nursing facility Living Arrangements: Alone Available Help at Discharge: Family;Available 24 hours/day (niece that lives next door) Type of Home: House (tiny home on niece's property) Home Access: Stairs to enter Secretary/administrator of Steps: 2 Entrance Stairs-Rails: None Home Layout: One level     Bathroom Shower/Tub: Producer, Television/film/video: Standard     Home Equipment: Agricultural Consultant (2 wheels);Grab bars - toilet   Additional Comments: Niece lives very close by, house on the same property. 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 Uses treking poles on his morning walks across the side walks going ~2 miles. 1 fall leading to current admission. ADLs Comments: Indep with self-care. 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    OT Problem List:  Decreased activity tolerance;Impaired balance (sitting and/or standing)   OT Treatment/Interventions: Self-care/ADL training;DME and/or AE instruction;Balance training;Patient/family education      OT Goals(Current goals can be found in the care plan section)   Acute Rehab OT Goals Patient Stated Goal: to go back to rehab and get back home from there (in general); to be able to get up and not get dizzy (today) OT Goal Formulation: With patient Time For Goal Achievement: 06/05/24 Potential  to Achieve Goals: Good   OT Frequency:  Min 2X/week       AM-PAC OT 6 Clicks Daily Activity     Outcome Measure Help from another person eating meals?: None Help from another person taking care of personal grooming?: A Lot Help from another person toileting, which includes using toliet, bedpan, or urinal?: Total Help from another person bathing (including washing, rinsing, drying)?: A Lot Help from another person to put on and taking off regular upper body clothing?: A Lot Help from another person to put on and taking off regular lower body clothing?: A Lot 6 Click Score: 13   End of Session Nurse Communication:  (orthostatic HTN)  Activity Tolerance:  (unable to attempt transfer today due to orthostatic HTN) Patient left: in bed;with bed alarm set;with nursing/sitter in room (RN in room, pt laying flat in bed)  OT Visit Diagnosis: Other abnormalities of gait and mobility (R26.89);Muscle weakness (generalized) (M62.81);Dizziness and giddiness (R42)                Time: 9157-9088 OT Time Calculation (min): 29 min Charges:  OT General Charges $OT Visit: 1 Visit OT Evaluation $OT Eval Moderate Complexity: 1 Mod OT Treatments $Self Care/Home Management : 8-22 mins  [name] OT Acute Rehabilitation Services Office [phone]    Rodgers Dorothyann Distel 06/05/2024, 9:31 AM

## 2024-06-06 DIAGNOSIS — D62 Acute posthemorrhagic anemia: Secondary | ICD-10-CM | POA: Diagnosis not present

## 2024-06-06 LAB — HEMOGLOBIN AND HEMATOCRIT, BLOOD
HCT: 24.9 % — ABNORMAL LOW (ref 39.0–52.0)
Hemoglobin: 8.1 g/dL — ABNORMAL LOW (ref 13.0–17.0)

## 2024-06-06 LAB — CBC
HCT: 24.8 % — ABNORMAL LOW (ref 39.0–52.0)
Hemoglobin: 8.3 g/dL — ABNORMAL LOW (ref 13.0–17.0)
MCH: 31 pg (ref 26.0–34.0)
MCHC: 33.5 g/dL (ref 30.0–36.0)
MCV: 92.5 fL (ref 80.0–100.0)
Platelets: 273 10*3/uL (ref 150–400)
RBC: 2.68 MIL/uL — ABNORMAL LOW (ref 4.22–5.81)
RDW: 16.3 % — ABNORMAL HIGH (ref 11.5–15.5)
WBC: 15.9 10*3/uL — ABNORMAL HIGH (ref 4.0–10.5)
nRBC: 1.3 % — ABNORMAL HIGH (ref 0.0–0.2)

## 2024-06-06 LAB — PROTIME-INR
INR: 2.7 — ABNORMAL HIGH (ref 0.8–1.2)
INR: 2.1 — ABNORMAL HIGH (ref 0.8–1.2)
Prothrombin Time: 29.6 s — ABNORMAL HIGH (ref 11.4–15.2)
Prothrombin Time: 24.3 s — ABNORMAL HIGH (ref 11.4–15.2)

## 2024-06-06 MED ORDER — WARFARIN SODIUM 5 MG PO TABS: 5.0000 mg | ORAL_TABLET | Freq: Once | ORAL | Status: DC

## 2024-06-06 MED ORDER — HEPARIN BOLUS VIA INFUSION
4300.0000 [IU] | Freq: Once | INTRAVENOUS | Status: AC
  Administered 2024-06-06: 4300 [IU] via INTRAVENOUS
  Filled 2024-06-06: qty 4300

## 2024-06-06 MED ORDER — HEPARIN (PORCINE) 25000 UT/250ML-% IV SOLN
1300.0000 [IU]/h | INTRAVENOUS | Status: DC
  Administered 2024-06-06: 1300 [IU]/h via INTRAVENOUS
  Filled 2024-06-06: qty 250

## 2024-06-06 NOTE — Progress Notes (Signed)
 PHARMACY - ANTICOAGULATION CONSULT NOTE  Pharmacy Consult for warfarin >> heparin   Indication: mech MVR   Patient Measurements: Height: 5' 11 (180.3 cm) Weight: 85 kg (187 lb 6.3 oz) IBW/kg (Calculated) : 75.3 HEPARIN  DW (KG): 85  Vital Signs: Temp: 98.3 F (36.8 C) (01/25 1032) Temp Source: Oral (01/25 1032) BP: 119/51 (01/25 1032) Pulse Rate: 103 (01/25 1032)  Labs: Recent Labs    06/04/24 1208 06/05/24 0508 06/05/24 0927 06/05/24 1715 06/06/24 0516 06/06/24 1638  HGB 7.1* 8.4* 8.2* 7.5* 8.3* 8.1*  HCT 22.8* 25.6* 24.2* 23.0* 24.8* 24.9*  PLT 420*  --  301  --  273  --   LABPROT 35.2* 32.1*  --   --  29.6* 24.3*  INR 3.3* 2.9*  --   --  2.7* 2.1*  CREATININE 0.80  --   --   --   --   --     Estimated Creatinine Clearance: 70.6 mL/min (by C-G formula based on SCr of 0.8 mg/dL).  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 Warfarin reversed with vitamin K po 2.5mg  x1  05/20/24. Patient discharged from [facility] 1/22 to SNF and came back to ER today with significant bleeding through dressing  Home warfarin regimen = 2.5 mg Mon, Wed, Fri and 5 mg Tues, Thur, Sat, Sun  > but plan when discharged yesterday was to continue warfarin dose at higher end of dosing - 5mg  daily until follow up with [facility] or at [facility] and full dose Lovenox  until INR > 2.5    Today, 06/06/24 INR 2.7, therapeutic  Hgb 7.1 ( 1/23)  Discussed with MD Over secure chat on 1/25, ok to resume warfarin today   Goal of Therapy:  INR 2.5-3.5 Monitor platelets by anticoagulation protocol: Yes   Plan:  Warfarin 5 mg PO x 1  Check INR daily while on warfarin Continue to monitor H&H and platelets    Rhaya Coale, PharmD, BCPS 06/06/2024 5:38 PM    ADDENDUM Due to continued bleeding, decision made in discussion with MD to hold warfarin dose Repeat INR was ordered at 1600 which was 2.1, subtherapeutic  Ok to administer  boluses Start heparin  4300 unit bolus followed by heparin  1300 units/hr Obtain HL 8 hours after start of infusion  Daily CBC while on heparin   Monitor for signs and symptoms of bleeding Monitor for ability to resume warfarin   Dolphus Roller, PharmD, BCPS 06/06/2024 5:41 PM

## 2024-06-06 NOTE — Progress Notes (Signed)
 PHARMACY - ANTICOAGULATION CONSULT NOTE  Pharmacy Consult for warfarin Indication: mech MVR   Patient Measurements: Height: 5' 11 (180.3 cm) Weight: 85 kg (187 lb 6.3 oz) IBW/kg (Calculated) : 75.3 HEPARIN  DW (KG): 85  Vital Signs: Temp: 97.3 F (36.3 C) (01/25 0530) Temp Source: Oral (01/25 0530) BP: 121/64 (01/25 0530) Pulse Rate: 98 (01/25 0530)  Labs: Recent Labs    06/04/24 1208 06/05/24 0508 06/05/24 0927 06/05/24 1715 06/06/24 0516  HGB 7.1* 8.4* 8.2* 7.5* 8.3*  HCT 22.8* 25.6* 24.2* 23.0* 24.8*  PLT 420*  --  301  --  273  LABPROT 35.2* 32.1*  --   --  29.6*  INR 3.3* 2.9*  --   --  2.7*  CREATININE 0.80  --   --   --   --     Estimated Creatinine Clearance: 70.6 mL/min (by C-G formula based on SCr of 0.8 mg/dL).  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 Warfarin reversed with vitamin K po 2.5mg  x1  05/20/24. Patient discharged from [facility] 1/22 to SNF and came back to ER today with significant bleeding through dressing  Home warfarin regimen = 2.5 mg Mon, Wed, Fri and 5 mg Tues, Thur, Sat, Sun  > but plan when discharged yesterday was to continue warfarin dose at higher end of dosing - 5mg  daily until follow up with [facility] clinic or at [facility] and full dose Lovenox  until INR > 2.5    Today, 06/06/24 INR 2.7, therapeutic  Hgb 7.1 ( 1/23)  Discussed with MD Over secure chat on 1/25, ok to resume warfarin today   Goal of Therapy:  INR 2.5-3.5 Monitor platelets by anticoagulation protocol: Yes   Plan:  Warfarin 5 mg PO x 1  Check INR daily while on warfarin Continue to monitor H&H and platelets    Lilja Soland, PharmD, BCPS 06/06/2024 9:21 AM

## 2024-06-06 NOTE — Progress Notes (Signed)
 " Progress Note   Patient: Terry [name] FMW:[id number] DOB: 01/03/1936 DOA: 06/04/2024     2 DOS: the patient was seen and examined on 06/06/2024    Brief hospital course: Terry [name] is a 89 y.o. man with PMH of mitral valve replacement on Coumadin  (INR goal 2.5-3.5), hypertension, hyperlipidemia and BPH who had suffered from a traumatic hematoma left leg followed by compartment syndrome and was admitted to the hospital.he underwent multiple debridement, wound VAC placement, 1 unit of blood transfusion and then discharged to a skilled nursing facility.   A day after discharge, he re- presented to the ED due to weakness.  Noted to be bleeding continually from his dressing.  Hemoglobin was 7.1 (7.4 at discharge).  INR was 3.3.  Surgery was consulted.  Assessment and Plan:  Acute blood loss anemia Symptomatic blood loss anemia in a patient with chronic anemia, coagulopathy on Coumadin . Patient has received 3 units PRBCs so far. Challenging situation because of need for therapeutic INR in the setting of MVR. -Will continue to monitor hemoglobin closely. - Transfuse as needed.  Traumatic left leg hematoma, status post compartment syndrome and skin grafting with ongoing bleeding Surgery on consult. Surgery recommending conservative management. - Continue to reinforce dressing.  History of mitral valve replacement with mechanical valve on chronic anticoagulation with Coumadin : Goal INR 2.5-3.5.  INR  3.3 on presentation INR is 2.7 today. Of note, patient continues to bleed and ultimately may require surgical intervention.  - Will continue to hold warfarin, recheck INR in PM and start patient on heparin  drip if subtherapeutic.  - Will defer resuming warfarin until bleeding resolves. Will keep patient on heparin  drip till bleeding resolved.    Hypotension Patient had a hypotensive episode on 1/24. May be related to blood loss. S/p fluid bolus. - Home metoprolol  reordered as short  acting, with holding parameters.   Essential hypertension  On metoprolol  at home. - Metoprolol  reordered with holding parameters.   Hyperlipidemia -Continue home Crestor .     Subjective: Patient continues to bleed.   Physical Exam: BP (!) 119/51 (BP Location: Left Arm)   Pulse (!) 103   Temp 98.3 F (36.8 C) (Oral)   Resp 18   Ht 5' 11 (1.803 m)   Wt 85 kg   SpO2 100%   BMI 26.14 kg/m    General: Alert, oriented X3  Eyes: Pupils equal, reactive  Oral cavity: moist mucous membranes  Head: Atraumatic, normocephalic  Neck: supple  Chest: clear to auscultation. No crackles, no wheezes  CVS: S1,S2 RRR. No murmurs  Abd: No distention, soft, non-tender. No masses palpable  Extr: No edema   MSK: Left lower extremity in dressing with strikethrough noted Neurological: Grossly intact.    Data Reviewed:    Latest Ref Rng & Units 06/06/2024    5:16 AM 06/05/2024    5:15 PM 06/05/2024    9:27 AM  CBC  WBC 4.0 - 10.5 K/uL 15.9   11.7   Hemoglobin 13.0 - 17.0 g/dL 8.3  7.5  8.2   Hematocrit 39.0 - 52.0 % 24.8  23.0  24.2   Platelets 150 - 400 K/uL 273   301       Latest Ref Rng & Units 06/04/2024   12:08 PM 06/03/2024    5:12 AM 06/01/2024    5:05 AM  BMP  Glucose 70 - 99 mg/dL 872  97  895   BUN 8 - 23 mg/dL 20  23  17  Creatinine 0.61 - 1.24 mg/dL 9.19  9.15  9.15   Sodium 135 - 145 mmol/L 138  136  136   Potassium 3.5 - 5.1 mmol/L 4.5  4.0  4.0   Chloride 98 - 111 mmol/L 106  105  105   CO2 22 - 32 mmol/L 25  24  23    Calcium  8.9 - 10.3 mg/dL 8.7  8.3  8.1      Family Communication: n/a  Disposition: Status is: Inpatient Patient with ongoing bleeding, hypotension requiring more than 2 midnights  DVT prophylaxis: On systemic anticoagulation       Author: MDALA-GAUSI, Christophor Eick AGATHA, MD 06/06/2024 12:56 PM  For on call review [email or web address].    "

## 2024-06-07 DIAGNOSIS — D62 Acute posthemorrhagic anemia: Secondary | ICD-10-CM | POA: Diagnosis not present

## 2024-06-07 LAB — TYPE AND SCREEN
ABO/RH(D): A POS
Antibody Screen: NEGATIVE
Unit division: 0
Unit division: 0
Unit division: 0

## 2024-06-07 LAB — BPAM RBC
Blood Product Expiration Date: 202602152359
Blood Product Expiration Date: 202602182359
Blood Product Expiration Date: 202602182359
ISSUE DATE / TIME: 202601231331
ISSUE DATE / TIME: 202601231843
ISSUE DATE / TIME: 202601241948
Unit Type and Rh: 6200
Unit Type and Rh: 6200
Unit Type and Rh: 6200

## 2024-06-07 LAB — HEMOGLOBIN AND HEMATOCRIT, BLOOD
HCT: 25.7 % — ABNORMAL LOW (ref 39.0–52.0)
Hemoglobin: 8.6 g/dL — ABNORMAL LOW (ref 13.0–17.0)

## 2024-06-07 LAB — PROTIME-INR
INR: 1.8 — ABNORMAL HIGH (ref 0.8–1.2)
Prothrombin Time: 21.7 s — ABNORMAL HIGH (ref 11.4–15.2)

## 2024-06-07 LAB — BASIC METABOLIC PANEL WITH GFR
Anion gap: 8 (ref 5–15)
BUN: 37 mg/dL — ABNORMAL HIGH (ref 8–23)
CO2: 23 mmol/L (ref 22–32)
Calcium: 8.2 mg/dL — ABNORMAL LOW (ref 8.9–10.3)
Chloride: 105 mmol/L (ref 98–111)
Creatinine, Ser: 0.84 mg/dL (ref 0.61–1.24)
GFR, Estimated: >60 mL/min
Glucose, Bld: 114 mg/dL — ABNORMAL HIGH (ref 70–99)
Potassium: 4.2 mmol/L (ref 3.5–5.1)
Sodium: 137 mmol/L (ref 135–145)

## 2024-06-07 LAB — HEPARIN LEVEL (UNFRACTIONATED)
Heparin Unfractionated: 1.05 [IU]/mL — ABNORMAL HIGH (ref 0.30–0.70)
Heparin Unfractionated: 0.49 [IU]/mL (ref 0.30–0.70)

## 2024-06-07 LAB — CBC
HCT: 21.7 % — ABNORMAL LOW (ref 39.0–52.0)
Hemoglobin: 7.3 g/dL — ABNORMAL LOW (ref 13.0–17.0)
MCH: 31.5 pg (ref 26.0–34.0)
MCHC: 33.6 g/dL (ref 30.0–36.0)
MCV: 93.5 fL (ref 80.0–100.0)
Platelets: 285 10*3/uL (ref 150–400)
RBC: 2.32 MIL/uL — ABNORMAL LOW (ref 4.22–5.81)
RDW: 16.4 % — ABNORMAL HIGH (ref 11.5–15.5)
WBC: 22.3 10*3/uL — ABNORMAL HIGH (ref 4.0–10.5)
nRBC: 1.9 % — ABNORMAL HIGH (ref 0.0–0.2)

## 2024-06-07 LAB — PREPARE RBC (CROSSMATCH)

## 2024-06-07 MED ORDER — HEPARIN (PORCINE) 25000 UT/250ML-% IV SOLN
1000.0000 [IU]/h | INTRAVENOUS | Status: DC
  Administered 2024-06-07 – 2024-06-10 (×4): 1050 [IU]/h via INTRAVENOUS
  Filled 2024-06-07 (×4): qty 250

## 2024-06-07 MED ORDER — WARFARIN SODIUM 6 MG PO TABS
6.0000 mg | ORAL_TABLET | Freq: Once | ORAL | Status: AC
  Administered 2024-06-07: 6 mg via ORAL
  Filled 2024-06-07 (×2): qty 1

## 2024-06-07 MED ORDER — WARFARIN - PHARMACIST DOSING INPATIENT: Freq: Every day | Status: DC

## 2024-06-07 MED ORDER — SODIUM CHLORIDE 0.9% IV SOLUTION: Freq: Once | INTRAVENOUS | Status: AC

## 2024-06-07 NOTE — Progress Notes (Addendum)
 PHARMACY - ANTICOAGULATION CONSULT NOTE  Pharmacy Consult for warfarin + heparin   Indication: mech MVR   Patient Measurements: Height: 5' 11 (180.3 cm) Weight: 85 kg (187 lb 6.3 oz) IBW/kg (Calculated) : 75.3 HEPARIN  DW (KG): 85  Vital Signs: Temp: 97.8 F (36.6 C) (01/26 1520) Temp Source: Oral (01/26 1520) BP: 122/60 (01/26 1520) Pulse Rate: 107 (01/26 1520)  Labs: Recent Labs    06/05/24 0927 06/05/24 1715 06/06/24 0516 06/06/24 1638 06/07/24 0529 06/07/24 1617  HGB 8.2*   < > 8.3* 8.1* 7.3*  --   HCT 24.2*   < > 24.8* 24.9* 21.7*  --   PLT 301  --  273  --  285  --   LABPROT  --   --  29.6* 24.3* 21.7*  --   INR  --   --  2.7* 2.1* 1.8*  --   HEPARINUNFRC  --   --   --   --  1.05* 0.49  CREATININE  --   --   --   --  0.84  --    < > = values in this interval not displayed.    Estimated Creatinine Clearance: 67.2 mL/min (by C-G formula based on SCr of 0.84 mg/dL).  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 Warfarin reversed with vitamin K po 2.5mg  x1  05/20/24. Patient discharged from [facility] 1/22 to SNF and came back to ER today with significant bleeding through dressing  Home warfarin regimen = 2.5 mg Mon, Wed, Fri and 5 mg Tues, Thur, Sat, Sun  > but discharge plan (1/22) was to continue warfarin dose at higher end of dosing - 5mg  daily until follow up with [facility] clinic or at [facility] and full dose Lovenox  until INR > 2.5   Significant events: 1/25 PM: Warfarin held per MD due to continued bleeding from dressing. Heparin  resumed 1/26 warfarin resumed per CCS recommendations  Today, 06/07/24 Most recent heparin  level now therapeutic after rate reduced to 1050 units/hr Hgb down ~1g; PLTc WNL  INR now subtherapeutic after holding since 1/22 Per RN, leg hematoma which has been continually bleeding is now mostly resolved, with clean/dry dressing  Goal of Therapy:  INR 2.5-3.5 Heparin  goal  0.3-0.7 Monitor platelets by anticoagulation protocol: Yes   Plan:  Continue heparin  infusion at 1050 units/hr Check confirmatory heparin  level with AM labs  Daily CBC  Warfarin 6 mg x1 today Check INR daily while holding warfarin Monitor for signs and symptoms of bleeding   Thank you for allowing pharmacy to be a part of this patients care.  Bard Jeans, PharmD, BCPS [phone] 06/07/2024, 4:57 PM

## 2024-06-07 NOTE — Plan of Care (Signed)
" °  Problem: Education: Goal: Knowledge of General Education information will improve Description: Including pain rating scale, medication(s)/side effects and non-pharmacologic comfort measures Outcome: Progressing   Problem: Health Behavior/Discharge Planning: Goal: Ability to manage health-related needs will improve Outcome: Progressing   Problem: Clinical Measurements: Goal: Ability to maintain clinical measurements within normal limits will improve Outcome: Progressing Goal: Will remain free from infection Outcome: Progressing Goal: Diagnostic test results will improve Outcome: Progressing Goal: Respiratory complications will improve Outcome: Progressing Goal: Cardiovascular complication will be avoided Outcome: Progressing   Problem: Activity: Goal: Risk for activity intolerance will decrease Outcome: Progressing   Problem: Nutrition: Goal: Adequate nutrition will be maintained Outcome: Completed/Met   Problem: Coping: Goal: Level of anxiety will decrease Outcome: Progressing   Problem: Pain Managment: Goal: General experience of comfort will improve and/or be controlled Outcome: Progressing   Problem: Safety: Goal: Ability to remain free from injury will improve Outcome: Progressing   Problem: Skin Integrity: Goal: Risk for impaired skin integrity will decrease Outcome: Progressing   "

## 2024-06-07 NOTE — Evaluation (Addendum)
 Physical Therapy Evaluation Patient Details Name: Terry [name] MRN: [id number] DOB: [birth year] Today's Date: 06/07/2024  History of Present Illness  Terry [name] is a 89 y.o. male with medical history significant of mitral valve replacement,HTN, hyperlipidemia and BPH who was in hospital 1/8-1/23 for a traumatic hematoma left leg followed by compartment syndrome and was admitted to the hospital, multiple debridement, wound VAC placement, 1 unit of blood transfusion and then discharged to a SNF. He comes back today due to feeling weak while transferring from bed to chair and began to feel faint as if he may pass out. He has intermittent bleeding into his dressing and that has been ongoing since last 2 weeks.  Clinical Impression  Pt admitted with above diagnosis.  Pt agreeable with encouragement. Pt overall min assist this session, able to perform STS x2 and transfer to chair. Dizziness with initial standing attempt, resolved with seated therapeutic rest. HR 119 with activity  Plan is for return to SNF. Continue PT in acute setting. Pt currently with functional limitations due to the deficits listed below (see PT Problem List). Pt will benefit from acute skilled PT to increase their independence and safety with mobility to allow discharge.           If plan is discharge home, recommend the following: A little help with walking and/or transfers;A little help with bathing/dressing/bathroom;Assistance with cooking/housework;Assist for transportation;Help with stairs or ramp for entrance   Can travel by private vehicle   Yes    Equipment Recommendations BSC/3in1;Wheelchair (measurements PT);Wheelchair cushion (measurements PT)  Recommendations for Other Services       Functional Status Assessment Patient has had a recent decline in their functional status and demonstrates the ability to make significant improvements in function in a reasonable and predictable amount of time.      Precautions / Restrictions Precautions Precautions: Fall Precaution/Restrictions Comments: watch orthostatics Restrictions LLE Weight Bearing Per Provider Order: Weight bearing as tolerated      Mobility  Bed Mobility Overal bed mobility: Needs Assistance Bed Mobility: Supine to Sit     Supine to sit: Min assist     General bed mobility comments: increased time, assist with LLE    Transfers Overall transfer level: Needs assistance Equipment used: Rolling walker (2 wheels) Transfers: Sit to/from Stand, Bed to chair/wheelchair/BSC Sit to Stand: Min assist   Step pivot transfers: Min assist       General transfer comment: STS x2, assist to power up to stand. seated rest d/t dizziness upon standing, resolved in sitting. min assist to steady for SPT bed to chair. HR 119    Ambulation/Gait               General Gait Details: pt politly declined attempt d/t fatigue  Stairs            Wheelchair Mobility     Tilt Bed    Modified Rankin (Stroke Patients Only)       Balance Overall balance assessment: Needs assistance Sitting-balance support: Feet supported, No upper extremity supported Sitting balance-Leahy Scale: Fair Sitting balance - Comments: no challenged   Standing balance support: Bilateral upper extremity supported, During functional activity, Reliant on assistive device for balance Standing balance-Leahy Scale: Poor                               Pertinent Vitals/Pain Pain Assessment Pain Assessment: No/denies pain    Home Living Family/patient expects  to be discharged to:: Skilled nursing facility Living Arrangements: Alone Available Help at Discharge: Family;Available 24 hours/day   Home Access: Stairs to enter Entrance Stairs-Rails: None Entrance Stairs-Number of Steps: 2   Home Layout: One level Home Equipment: Agricultural Consultant (2 wheels);Grab bars - toilet Additional Comments: Niece lives very close by, house on the  same property. 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 Uses treking poles on his morning walks across the side walks going ~2 miles. 1 fall leading to current admission. ADLs Comments: Indep with self-care. 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     Extremity/Trunk Assessment   Upper Extremity Assessment Upper Extremity Assessment: Defer to OT evaluation;Generalized weakness    Lower Extremity Assessment Lower Extremity Assessment: Generalized weakness LLE Deficits / Details: bandaging LLE, small amount of bleed through, bulky dressing in place. positions L foot in inversion. able to  df/pf 4/5; knee flexion and extension 4/5, hip flexion 3+/5       Communication   Communication Communication: Impaired Factors Affecting Communication: Hearing impaired    Cognition Arousal: Alert Behavior During Therapy: WFL for tasks assessed/performed   PT - Cognitive impairments: No apparent impairments                         Following commands: Intact       Cueing Cueing Techniques: Verbal cues     General Comments      Exercises General Exercises - Lower Extremity Ankle Circles/Pumps: AROM, Both, 10 reps, Other (comment) (df hold 30 seconds, light achilles stretch)   Assessment/Plan    PT Assessment Patient needs continued PT services  PT Problem List Decreased strength;Decreased activity tolerance;Decreased balance;Decreased mobility;Decreased knowledge of use of DME;Decreased safety awareness;Decreased skin integrity;Pain       PT Treatment Interventions DME instruction;Gait training;Stair training;Functional mobility training;Therapeutic activities;Therapeutic exercise;Balance training;Patient/family education    PT Goals (Current  goals can be found in the Care Plan section)  Acute Rehab PT Goals Patient Stated Goal: Return Home and regain independence PT Goal Formulation: With patient Time For Goal Achievement: 06/21/24 Potential to Achieve Goals: Good    Frequency Min 3X/week     Co-evaluation               AM-PAC PT 6 Clicks Mobility  Outcome Measure Help needed turning from your back to your side while in a flat bed without using bedrails?: A Little Help needed moving from lying on your back to sitting on the side of a flat bed without using bedrails?: A Little Help needed moving to and from a bed to a chair (including a wheelchair)?: A Little Help needed standing up from a chair using your arms (e.g., wheelchair or bedside chair)?: A Little Help needed to walk in hospital room?: A Lot Help needed climbing 3-5 steps with a railing? : A Lot 6 Click Score: 16    End of Session Equipment Utilized During Treatment: Gait belt Activity Tolerance: Patient limited by fatigue Patient left: in chair;with call bell/phone within reach;with chair alarm set Nurse Communication: Mobility status PT Visit Diagnosis: Difficulty in walking, not elsewhere classified (R26.2);Other abnormalities of gait and mobility (R26.89)    Time: 8463-8396 PT Time Calculation (min) (  ACUTE ONLY): 27 min   Charges:   PT Evaluation $PT Eval Low Complexity: 1 Low PT Treatments $Therapeutic Activity: 8-22 mins PT General Charges $$ ACUTE PT VISIT: 1 Visit         Carles Florea, PT  Acute Rehab Dept [facility]) [phone]  06/07/2024   [facility] 06/07/2024, 4:06 PM

## 2024-06-07 NOTE — NC FL2 (Signed)
 " [place]  MEDICAID FL2 LEVEL OF CARE FORM     IDENTIFICATION  Patient Name: ATWOOD ADCOCK Birthdate: 1936/03/22 Sex: male Admission Date (Current Location): 06/04/2024  [facility] and [place] Number:  Producer, Television/film/video and Address:  [facility],  [address], [place] [address]      Provider Number: [id number]  Attending Physician Name and Address:  Jearlean, Masiku Agat*  Relative Name and Phone Number:  Neysa Perry  Niece, Emergency Contact  [phone] (Mobile)    Current Level of Care: Hospital Recommended Level of Care: Skilled Nursing Facility Prior Approval Number:    Date Approved/Denied:   PASRR Number: [id number]  Discharge Plan: SNF    Current Diagnoses: Patient Active Problem List   Diagnosis Date Noted   Symptomatic anemia 06/04/2024   Nontraumatic compartment syndrome of left lower extremity 05/26/2024   Hematoma of left lower extremity 05/24/2024   Acute posthemorrhagic anemia 12/24/2021   Essential hypertension 12/24/2021   Hyperlipidemia 12/24/2021   Fall 12/08/2021   Multiple abrasions 12/08/2021   Hematoma 12/08/2021   Syncope 12/07/2021   Encounter for therapeutic drug monitoring 05/09/2017   Chronic anticoagulation 03/01/2016   S/P mitral valve replacement 11/09/2010   H/O mitral valve replacement with mechanical valve 08/03/2010    Orientation RESPIRATION BLADDER Height & Weight     Self, Time, Situation, Place  Normal Continent Weight: 85 kg Height:  5' 11 (180.3 cm)  BEHAVIORAL SYMPTOMS/MOOD NEUROLOGICAL BOWEL NUTRITION STATUS      Continent Diet (regular)  AMBULATORY STATUS COMMUNICATION OF NEEDS Skin   Limited Assist Verbally Surgical wounds (L leg /wcompression wrapu; ecchymosis bilateral arms, erythema bilateral buttocks)                       Personal Care Assistance Level of Assistance  Bathing, Feeding, Dressing Bathing Assistance: Limited assistance Feeding assistance: Limited  assistance Dressing Assistance: Limited assistance     Functional Limitations Info  Sight, Hearing, Speech Sight Info: Impaired (eyeglasses) Hearing Info: Impaired (Hard of hearing- Hearing aid) Speech Info: Adequate    SPECIAL CARE FACTORS FREQUENCY  PT (By licensed PT), OT (By licensed OT)     PT Frequency: 5x/wk OT Frequency: 5x/wk            Contractures Contractures Info: Not present    Additional Factors Info  Code Status, Allergies, Psychotropic Code Status Info: Full Code Allergies Info: Alendronate, Aspirin, Cipro (Ciprofloxacin Hcl), Sulfa Drugs Cross Reactors Psychotropic Info: N/A         Current Medications (06/07/2024):  This is the current hospital active medication list Current Facility-Administered Medications  Medication Dose Route Frequency Provider Last Rate Last Admin   0.9 %  sodium chloride  infusion (Manually program via Guardrails IV Fluids)   Intravenous Once Bernard Drivers, MD   Held at 06/04/24 1343   acetaminophen  (TYLENOL ) tablet 1,000 mg  1,000 mg Oral Q8H Raenelle Coria, MD   1,000 mg at 06/06/24 2102   dorzolamide -timolol  (COSOPT ) 2-0.5 % ophthalmic solution 1 drop  1 drop Both Eyes BID Ghimire, Kuber, MD   1 drop at 06/07/24 0855   feeding supplement (ENSURE PLUS HIGH PROTEIN) liquid 237 mL  237 mL Oral TID BM Mdala-Gausi, Masiku Agatha, MD   237 mL at 06/07/24 0854   heparin  ADULT infusion 100 units/mL (25000 units/250mL)  1,050 Units/hr Intravenous Continuous Utomwen, Adesuwa, RPH 10.5 mL/hr at 06/07/24 0839 1,050 Units/hr at 06/07/24 0839   lidocaine  (LIDODERM ) 5 % 1 patch  1  patch Transdermal Q24H Ghimire, Kuber, MD       metoprolol  tartrate (LOPRESSOR ) tablet 25 mg  25 mg Oral BID Mdala-Gausi, Masiku Agatha, MD   25 mg at 06/07/24 0840   multivitamin with minerals tablet 1 tablet  1 tablet Oral Daily Raenelle Coria, MD   1 tablet at 06/07/24 0840   oxyCODONE  (Oxy IR/ROXICODONE ) immediate release tablet 5 mg  5 mg Oral Q6H PRN Ghimire,  Kuber, MD       pantoprazole  (PROTONIX ) EC tablet 40 mg  40 mg Oral Daily Ghimire, Kuber, MD   40 mg at 06/07/24 0840   polyethylene glycol (MIRALAX  / GLYCOLAX ) packet 17 g  17 g Oral Daily Ghimire, Kuber, MD   17 g at 06/07/24 0840   rosuvastatin  (CRESTOR ) tablet 20 mg  20 mg Oral QHS Ghimire, Kuber, MD   20 mg at 06/06/24 2102   Tafluprost  (PF) 0.0015 % SOLN 1 drop  1 drop Both Eyes QHS Ghimire, Kuber, MD       warfarin (COUMADIN ) tablet 6 mg  6 mg Oral ONCE-1600 Casimir Camelia RAMAN, [facility]       Warfarin - Pharmacist Dosing Inpatient   Does not apply q1600 Casimir Camelia RAMAN, [facility]         Discharge Medications: Please see discharge summary for a list of discharge medications.  Relevant Imaging Results:  Relevant Lab Results:   Additional Information SSN: [id number]  Alfonse JONELLE Rex, RN     "

## 2024-06-07 NOTE — Progress Notes (Signed)
 PHARMACY - ANTICOAGULATION CONSULT NOTE  Pharmacy Consult for Warfarin Indication: mechanical valve  Allergies[1]  Patient Measurements: Height: 5' 11 (180.3 cm) Weight: 85 kg (187 lb 6.3 oz) IBW/kg (Calculated) : 75.3 HEPARIN  DW (KG): 85  Vital Signs: Temp: 97.6 F (36.4 C) (01/26 1146) Temp Source: Oral (01/26 1131) BP: 110/50 (01/26 1146) Pulse Rate: 89 (01/26 1146)  Labs: Recent Labs    06/05/24 0927 06/05/24 1715 06/06/24 0516 06/06/24 1638 06/07/24 0529  HGB 8.2*   < > 8.3* 8.1* 7.3*  HCT 24.2*   < > 24.8* 24.9* 21.7*  PLT 301  --  273  --  285  LABPROT  --   --  29.6* 24.3* 21.7*  INR  --   --  2.7* 2.1* 1.8*  HEPARINUNFRC  --   --   --   --  1.05*  CREATININE  --   --   --   --  0.84   < > = values in this interval not displayed.    Estimated Creatinine Clearance: 67.2 mL/min (by C-G formula based on SCr of 0.84 mg/dL).   Medical History: Past Medical History:  Diagnosis Date   Benign prostatic hypertrophy    Dyslipidemia    Hyperlipidemia    Long-term (current) use of anticoagulants    Mitral valve prolapse    Osteoporosis    Ventricular tachycardia (HCC)     Assessment: AC/Heme: MVR on warfarin PTA - INR 3.3 (1/23), - Previous home regimen: 2.5 mg MWF and 5 mg TThSS (weekly dose = 27.5) but plan when discharged 1/22 was to continue warfarin dose at higher end of dosing - 5mg  daily until follow up with [facility] clinic or at [facility] and full dose Lovenox  until INR > 2.5  -1/26: INR 1.8. Start IV heparin  + warfarin (LD 1/22),  AM RN notes continued bleeding. Dr. Harden seeing patient. (dressing on L leg from hematoma)  Goal of Therapy:  INR 2.5-3.5 Monitor platelets by anticoagulation protocol: Yes   Plan:  Coumadin  6mg  po x 1 tonight Daily INR   Arhum Peeples Karoline Marina, PharmD, BCPS Clinical Staff Pharmacist Marina, Camelia Stillinger 06/07/2024,1:08 PM      [1]  Allergies Allergen Reactions   Alendronate Other (See Comments)    GERD  and allergic, per [facility]   Aspirin Other (See Comments)    Too much bruising when added to Coumadin ; listed as allergic, per MAR   Cipro [Ciprofloxacin Hcl] Other (See Comments)    Heartburn and allergic, per [facility]   Sulfa Drugs Cross Reactors Hives and Other (See Comments)    Allergic, per [facility]

## 2024-06-07 NOTE — Plan of Care (Signed)

## 2024-06-07 NOTE — TOC Initial Note (Signed)
 Transition of Care [facility]) - Initial/Assessment Note    Patient Details  Name: Terry [name] MRN: [id number] Date of Birth: 10/24/1935  Transition of [facility]) CM/SW Contact:    Alfonse JONELLE Rex, RN Phone Number: 06/07/2024, 4:23 PM  Clinical Narrative:   Admitted from [facility] with c/o bleeding through the dressing, feels weak and tired. Discharged from hospital yesterday.    PT eval completed, recommendation for short term rehab/SNF, FL2 updated, faxed out for bed offers. Randine w/[facility] PT notified pt admitted with probable return for short term rehab.                    Patient Goals and CMS Choice            Expected Discharge Plan and Services                                              Prior Living Arrangements/Services                       Activities of Daily Living   ADL Screening (condition at time of admission) Independently performs ADLs?: Yes (appropriate for developmental age) Is the patient deaf or have difficulty hearing?: Yes Does the patient have difficulty seeing, even when wearing glasses/contacts?: No Does the patient have difficulty concentrating, remembering, or making decisions?: No  Permission Sought/Granted                  Emotional Assessment              Admission diagnosis:  Warfarin-induced coagulopathy [D68.32, T45.515A] Generalized weakness [R53.1] Near syncope [R55] Symptomatic anemia [D64.9] Hematoma of left lower leg [S80.12XA] Patient Active Problem List   Diagnosis Date Noted   Symptomatic anemia 06/04/2024   Nontraumatic compartment syndrome of left lower extremity 05/26/2024   Hematoma of left lower extremity 05/24/2024   Acute posthemorrhagic anemia 12/24/2021   Essential hypertension 12/24/2021   Hyperlipidemia 12/24/2021   Fall 12/08/2021   Multiple abrasions 12/08/2021   Hematoma 12/08/2021   Syncope 12/07/2021   Encounter for therapeutic drug monitoring 05/09/2017    Chronic anticoagulation 03/01/2016   S/P mitral valve replacement 11/09/2010   H/O mitral valve replacement with mechanical valve 08/03/2010   PCP:  Shayne Anes, MD Pharmacy:   [facility] - [place], [place] - [address] [place] [address] Phone: [phone] Fax: [phone]     Social Drivers of Health (SDOH) Social History: SDOH Screenings   Food Insecurity: No Food Insecurity (06/04/2024)  Housing: Low Risk (06/04/2024)  Transportation Needs: No Transportation Needs (06/04/2024)  Utilities: Not At Risk (06/04/2024)  Social Connections: Unknown (06/04/2024)  Recent Concern: Social Connections - Socially Isolated (05/20/2024)  Tobacco Use: Medium Risk (06/04/2024)   SDOH Interventions:     Readmission Risk Interventions     No data to display

## 2024-06-07 NOTE — Progress Notes (Signed)
 Patient ID: Terry [name], male   DOB: 12-01-1935, 89 y.o.   MRN: [id number] Patient is seen in follow-up status post debridement hematoma left leg that involves essentially three quarters of the surface area of the left leg.  Patient has been on chronic anticoagulation therapy for a mechanical valve currently on heparin .  He has received transfusions recently.  His hemoglobin has run between 7 and 8 for the last several years.  The dressing was removed.  There was an old dressing that had not been removed and beneath this dressing was a large hematoma.  After removal of the hematoma there was no active bleeding there was no oozing.  A dry dressing was applied.    Orders placed for Vashe dressing changes daily.  Recommend resume Coumadin  at a lower dose and discontinue the heparin .  No surgical intervention indicated at this time.

## 2024-06-07 NOTE — Progress Notes (Signed)
 " Progress Note   Patient: Terry [name] FMW:[id number] DOB: 20-Apr-1936 DOA: 06/04/2024     3 DOS: the patient was seen and examined on 06/07/2024    Brief hospital course: Terry [name] is a 89 y.o. man with PMH of mitral valve replacement on Coumadin  (INR goal 2.5-3.5), hypertension, hyperlipidemia and BPH who had suffered from a traumatic hematoma left leg followed by compartment syndrome and was admitted to the hospital.he underwent multiple debridement, wound VAC placement, 1 unit of blood transfusion and then discharged to a skilled nursing facility.   A day after discharge, he re- presented to the ED due to weakness.  Noted to be bleeding continually from his dressing.  Hemoglobin was 7.1 (7.4 at discharge).  INR was 3.3.  Surgery was consulted.  Assessment and Plan:  Acute blood loss anemia Symptomatic blood loss anemia in a patient with chronic anemia, coagulopathy on Coumadin . Patient has received 4 units PRBCs so far. Challenging situation because of need for therapeutic INR in the setting of MVR. -Will continue to monitor hemoglobin closely. - Transfuse as needed.  Traumatic left leg hematoma, status post compartment syndrome and skin grafting with ongoing bleeding Surgery on consult. Surgery recommending conservative management. Patient seen again by surgery on 1/26.  Noted there was an old dressing that had not been removed and beneath this dressing was a large hematoma.  After removal of the hematoma, no active bleeding, no bruising. - Vashe dressing changes daily. - Surgery recommended resuming Coumadin .  History of mitral valve replacement with mechanical valve on chronic anticoagulation with Coumadin : Goal INR 2.5-3.5.  INR  3.3 on presentation INR is 1.8 today. Patient seen by surgery.  Recommending continued conservative management and resumption of Coumadin . -Will resume Coumadin  with heparin  bridge until patient is therapeutic.   Hypotension Patient had a  hypotensive episode on 1/24. May be related to blood loss. S/p fluid bolus. - Home metoprolol  reordered as short acting, with holding parameters.   Essential hypertension  On metoprolol  at home. - Metoprolol  reordered with holding parameters.   Hyperlipidemia -Continue home Crestor .     Subjective: Patient seen by surgery today. An old dressing and hematoma removed and no more bleeding was noted.  Physical Exam: BP (!) 110/50   Pulse 89   Temp 97.6 F (36.4 C)   Resp 18   Ht 5' 11 (1.803 m)   Wt 85 kg   SpO2 97%   BMI 26.14 kg/m    General: Alert, oriented X3  Eyes: Pupils equal, reactive  Oral cavity: moist mucous membranes  Head: Atraumatic, normocephalic  Neck: supple  Chest: clear to auscultation. No crackles, no wheezes  CVS: S1,S2 RRR. No murmurs  Abd: No distention, soft, non-tender. No masses palpable  Extr: No edema   MSK: Left lower extremity in dressing Neurological: Grossly intact.    Data Reviewed:    Latest Ref Rng & Units 06/07/2024    5:29 AM 06/06/2024    4:38 PM 06/06/2024    5:16 AM  CBC  WBC 4.0 - 10.5 K/uL 22.3   15.9   Hemoglobin 13.0 - 17.0 g/dL 7.3  8.1  8.3   Hematocrit 39.0 - 52.0 % 21.7  24.9  24.8   Platelets 150 - 400 K/uL 285   273       Latest Ref Rng & Units 06/07/2024    5:29 AM 06/04/2024   12:08 PM 06/03/2024    5:12 AM  BMP  Glucose 70 - 99 mg/dL  114  127  97   BUN 8 - 23 mg/dL 37  20  23   Creatinine 0.61 - 1.24 mg/dL 9.15  9.19  9.15   Sodium 135 - 145 mmol/L 137  138  136   Potassium 3.5 - 5.1 mmol/L 4.2  4.5  4.0   Chloride 98 - 111 mmol/L 105  106  105   CO2 22 - 32 mmol/L 23  25  24    Calcium  8.9 - 10.3 mg/dL 8.2  8.7  8.3      Family Communication: n/a  Disposition: Status is: Inpatient Patient with ongoing bleeding, hypotension, heparin  gtt requiring more than 2 midnights  DVT prophylaxis: On systemic anticoagulation       Author: MDALA-GAUSI, GOLDEN PILLOW, MD 06/07/2024 12:46 PM  For on call  review [email or web address].    "

## 2024-06-07 NOTE — Progress Notes (Addendum)
 PHARMACY - ANTICOAGULATION CONSULT NOTE  Pharmacy Consult for warfarin >> heparin   Indication: mech MVR   Patient Measurements: Height: 5' 11 (180.3 cm) Weight: 85 kg (187 lb 6.3 oz) IBW/kg (Calculated) : 75.3 HEPARIN  DW (KG): 85  Vital Signs: Temp: 97.9 F (36.6 C) (01/26 0535) Temp Source: Oral (01/26 0535) BP: 112/64 (01/26 0535) Pulse Rate: 94 (01/26 0535)  Labs: Recent Labs    06/04/24 1208 06/05/24 0508 06/05/24 0927 06/05/24 1715 06/06/24 0516 06/06/24 1638 06/07/24 0529  HGB 7.1*   < > 8.2*   < > 8.3* 8.1* 7.3*  HCT 22.8*   < > 24.2*   < > 24.8* 24.9* 21.7*  PLT 420*  --  301  --  273  --  285  LABPROT 35.2*   < >  --   --  29.6* 24.3* 21.7*  INR 3.3*   < >  --   --  2.7* 2.1* 1.8*  HEPARINUNFRC  --   --   --   --   --   --  1.05*  CREATININE 0.80  --   --   --   --   --  0.84   < > = values in this interval not displayed.    Estimated Creatinine Clearance: 67.2 mL/min (by C-G formula based on SCr of 0.84 mg/dL).  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 Warfarin reversed with vitamin K po 2.5mg  x1  05/20/24. Patient discharged from [facility] 1/22 to SNF and came back to ER today with significant bleeding through dressing  Home warfarin regimen = 2.5 mg Mon, Wed, Fri and 5 mg Tues, Thur, Sat, Sun  > but discharge plan (1/22) was to continue warfarin dose at higher end of dosing - 5mg  daily until follow up with [facility] clinic or at [facility] and full dose Lovenox  until INR > 2.5   Significant events: 1/25 PM: Warfarin held per MD due to continued bleeding from dressing. Heparin  resumed   Today, 06/07/24 AM Heparin  level = 1.05, supratherapeutic after restarting heparin  1/26 PM  Infusion currently running at 1300 units/hr  Hgb down to 7.3, PLTc WNL  INR down to 1.8, subtherapeutic after receiving 5mg  yesterday Continued reports of intermittent bleeding via dressing per RN     Goal of Therapy:   INR 2.5-3.5 Heparin  goal 0.3-0.7 Monitor platelets by anticoagulation protocol: Yes   Plan:  Hold Heparin  for 1 hour, then resume at reduced rate of 1050 units/hr Check heparin  level in 8 hours after resumption  Daily CBC  Check INR daily while holding warfarin Monitor for signs and symptoms of bleeding Monitor for ability to resume warfarin   Thank you for allowing pharmacy to be a part of this patients care.  Marget Hench, PharmD Clinical Pharmacist 06/07/2024 6:37 AM

## 2024-06-08 ENCOUNTER — Inpatient Hospital Stay (HOSPITAL_COMMUNITY)

## 2024-06-08 DIAGNOSIS — D62 Acute posthemorrhagic anemia: Secondary | ICD-10-CM | POA: Diagnosis not present

## 2024-06-08 LAB — CBC
HCT: 21.6 % — ABNORMAL LOW (ref 39.0–52.0)
Hemoglobin: 7.4 g/dL — ABNORMAL LOW (ref 13.0–17.0)
MCH: 31.8 pg (ref 26.0–34.0)
MCHC: 34.3 g/dL (ref 30.0–36.0)
MCV: 92.7 fL (ref 80.0–100.0)
Platelets: 297 10*3/uL (ref 150–400)
RBC: 2.33 MIL/uL — ABNORMAL LOW (ref 4.22–5.81)
RDW: 15 % (ref 11.5–15.5)
WBC: 25.3 10*3/uL — ABNORMAL HIGH (ref 4.0–10.5)
nRBC: 3.3 % — ABNORMAL HIGH (ref 0.0–0.2)

## 2024-06-08 LAB — HEPARIN LEVEL (UNFRACTIONATED): Heparin Unfractionated: 0.56 [IU]/mL (ref 0.30–0.70)

## 2024-06-08 LAB — URINALYSIS, ROUTINE W REFLEX MICROSCOPIC
Bilirubin Urine: NEGATIVE
Glucose, UA: NEGATIVE mg/dL
Hgb urine dipstick: NEGATIVE
Ketones, ur: NEGATIVE mg/dL
Leukocytes,Ua: NEGATIVE
Nitrite: NEGATIVE
Protein, ur: NEGATIVE mg/dL
Specific Gravity, Urine: 1.025 (ref 1.005–1.030)
pH: 5 (ref 5.0–8.0)

## 2024-06-08 LAB — PROTIME-INR
INR: 1.4 — ABNORMAL HIGH (ref 0.8–1.2)
Prothrombin Time: 18.3 s — ABNORMAL HIGH (ref 11.4–15.2)

## 2024-06-08 LAB — RESP PANEL BY RT-PCR (RSV, FLU A&B, COVID)  RVPGX2
Influenza A by PCR: NEGATIVE
Influenza B by PCR: NEGATIVE
Resp Syncytial Virus by PCR: NEGATIVE
SARS Coronavirus 2 by RT PCR: NEGATIVE

## 2024-06-08 MED ORDER — ORAL CARE MOUTH RINSE: 15.0000 mL | OROMUCOSAL | Status: DC | PRN

## 2024-06-08 MED ORDER — LACTATED RINGERS IV BOLUS
500.0000 mL | Freq: Once | INTRAVENOUS | Status: AC
  Administered 2024-06-08: 500 mL via INTRAVENOUS

## 2024-06-08 MED ORDER — TRANEXAMIC ACID FOR EPISTAXIS
2000.0000 mg | Freq: Once | TOPICAL | Status: AC
  Administered 2024-06-08: 2000 mg via TOPICAL
  Filled 2024-06-08: qty 20

## 2024-06-08 MED ORDER — POLYETHYLENE GLYCOL 3350 17 G PO PACK: 17.0000 g | PACK | Freq: Every day | ORAL | Status: DC | PRN

## 2024-06-08 MED ORDER — PIPERACILLIN-TAZOBACTAM 3.375 G IVPB
3.3750 g | Freq: Three times a day (TID) | INTRAVENOUS | Status: DC
  Administered 2024-06-08 – 2024-06-11 (×8): 3.375 g via INTRAVENOUS
  Filled 2024-06-08 (×8): qty 50

## 2024-06-08 MED ORDER — TRANEXAMIC ACID 1000 MG/10ML IV SOLN: 2000.0000 mg | Freq: Once | INTRAVENOUS | Status: DC

## 2024-06-08 MED ORDER — WARFARIN SODIUM 5 MG PO TABS
5.0000 mg | ORAL_TABLET | Freq: Once | ORAL | Status: AC
  Administered 2024-06-08: 5 mg via ORAL
  Filled 2024-06-08: qty 1

## 2024-06-08 NOTE — Consult Note (Signed)
 WOC Nurse Consult Note: Reason for Consult: Large open surgical wound LLE Open surgical wound, copious bleeding r/t heparin /coumadin . Edges epibole. Current care is Vashe soaked 4x4, abd, kerlix, ace wrap. Want to verify this is best practice for this wound. Surgeon agrees to consult  Wound type: Orthopedic surgeon wrote wound care orders on 1/27, WOC nursing would not usurp surgeons orders.    Re consult if needed, will not follow at this time. Thanks  Stepanie [employer]ver [employer], RN,CWOCN, CNS, [employer] [phone]

## 2024-06-08 NOTE — Plan of Care (Signed)
  Problem: Education: Goal: Knowledge of General Education information will improve Description: Including pain rating scale, medication(s)/side effects and non-pharmacologic comfort measures Outcome: Progressing   Problem: Health Behavior/Discharge Planning: Goal: Ability to manage health-related needs will improve Outcome: Progressing   Problem: Clinical Measurements: Goal: Ability to maintain clinical measurements within normal limits will improve Outcome: Progressing Goal: Will remain free from infection Outcome: Progressing Goal: Diagnostic test results will improve Outcome: Progressing Goal: Respiratory complications will improve Outcome: Progressing Goal: Cardiovascular complication will be avoided Outcome: Progressing   Problem: Activity: Goal: Risk for activity intolerance will decrease Outcome: Progressing   Problem: Coping: Goal: Level of anxiety will decrease Outcome: Progressing   Problem: Pain Managment: Goal: General experience of comfort will improve and/or be controlled Outcome: Progressing   Problem: Safety: Goal: Ability to remain free from injury will improve Outcome: Progressing   Problem: Skin Integrity: Goal: Risk for impaired skin integrity will decrease Outcome: Progressing

## 2024-06-08 NOTE — Progress Notes (Signed)
 Called to room by lab.  The patient had bled through his dressing and blankets.  ACE wrap, gauze and some ABDs removed.  When removing ABD near the ankle, a large amount of bright red bleeding was noted. Compression provided with sterile 4x4s until it appeared the bleeding had slowed considerably.  New sterile 4x4s moistened with Vashe were applied. An ABD pad was placed over the 4x4s.  On the wound nearest the knee, I was unable to remove the ABS. I saturated them with saline, and tried multiple times to pull them off without success.  On the lateral aspect of the wound, while trying to remove the ABD, it looked like a large piece of tissue was being pulled out and was giving the patient much discomfort. Even though the area was irrigated with NS, I was unable to remove this ABD. I saturated several 4x4s with Vashe and tucked them under the area of the ABD that could be pulled back. I also wet the ABD in the hope it could be removed more easily later. Sterile 4x4s and 2 more ABD pads were placed, wrapped with Kerlix, and an ACE wrap. Dressing is currently C,D,&I. The patient is now reporting no pain. Will continue to observe for additional bleeding/drainage.

## 2024-06-08 NOTE — Progress Notes (Signed)
 Patient ID: Terry [name], male   DOB: 10/15/1935, 89 y.o.   MRN: [id number] I placed an order to apply TXA topically to 4 x 4 gauze applied to the wound covered with ABD and an Ace wrap for a one-time treatment.  Resume daily Vashe changes after this.

## 2024-06-08 NOTE — Progress Notes (Addendum)
 " Progress Note   Patient: Terry [name] FMW:[id number] DOB: Apr 17, 1936 DOA: 06/04/2024     4 DOS: the patient was seen and examined on 06/08/2024    Brief hospital course: Terry [name] is a 89 y.o. man with PMH of mitral valve replacement on Coumadin  (INR goal 2.5-3.5), hypertension, hyperlipidemia and BPH who had suffered from a traumatic hematoma left leg followed by compartment syndrome and was admitted to the hospital.he underwent multiple debridement, wound VAC placement, 1 unit of blood transfusion and then discharged to a skilled nursing facility.   A day after discharge, he re- presented to the ED due to weakness.  Noted to be bleeding continually from his dressing.  Hemoglobin was 7.1 (7.4 at discharge).  INR was 3.3.  Surgery was consulted. Conservative management was pursued.  Coumadin  was held and then resumed with heparin  bridging ordered as appropriate.  Assessment and Plan:  Leukocytosis Sinus tachycardia Upward trend noted in white cell count. Chest x-ray ordered.  Shows no concerning findings. Blood cultures ordered. - Check UA. - Follow-up blood cultures. - Will start broad-spectrum antibiotics (Zosyn ).    Acute blood loss anemia Symptomatic blood loss anemia in a patient with chronic anemia, coagulopathy on Coumadin . Patient has received 4 units PRBCs so far. Challenging situation because of need for therapeutic INR in the setting of MVR. -Will continue to monitor hemoglobin closely. - Transfuse as needed.  Traumatic left leg hematoma, status post compartment syndrome and skin grafting with ongoing bleeding Surgery on consult. Surgery recommending conservative management. Patient seen again by surgery on 1/26.  Noted there was an old dressing that had not been removed and beneath this dressing was a large hematoma.  Hematoma was removed with some improvement in bleeding, but bleeding has recurred. - Surgery following. - Vashe dressing changes  daily. -Coumadin  resumed.  History of mitral valve replacement with mechanical valve on chronic anticoagulation with Coumadin : Goal INR 2.5-3.5.  INR  3.3 on presentation INR is 1.4 today. Patient seen by surgery.  Recommending continued conservative management and resumption of Coumadin . -Continue Coumadin  with heparin  bridge until patient is therapeutic.   Hypotension Patient had a hypotensive episode on 1/24. May be related to blood loss. S/p fluid bolus. - Home metoprolol  reordered as short acting, with holding parameters.   Essential hypertension  On metoprolol  at home. - Metoprolol  reordered with holding parameters.   Hyperlipidemia -Continue home Crestor .     Subjective: Patient mainly complains of weakness.  Noted to have a low-grade fever, but has no new complaints.  No new pain.  No headache, sore throat, upper respiratory symptoms.  Patient tachycardic this afternoon.  EKG shows sinus tachycardia  Physical Exam: BP (!) 108/57 (BP Location: Right Arm)   Pulse (!) 116   Temp 98.2 F (36.8 C) (Oral)   Resp 14   Ht 5' 11 (1.803 m)   Wt 85 kg   SpO2 100%   BMI 26.14 kg/m    General: Alert, oriented X3  Eyes: Pupils equal, reactive  Oral cavity: moist mucous membranes  Head: Atraumatic, normocephalic  Neck: supple  Chest: clear to auscultation. No crackles, no wheezes  CVS: S1,S2 RRR. No murmurs  Abd: No distention, soft, non-tender. No masses palpable  Extr: No edema   MSK: Left lower extremity in dressing Neurological: Grossly intact.    Data Reviewed:    Latest Ref Rng & Units 06/08/2024    3:50 AM 06/07/2024    7:15 PM 06/07/2024    5:29 AM  CBC  WBC 4.0 - 10.5 K/uL 25.3   22.3   Hemoglobin 13.0 - 17.0 g/dL 7.4  8.6  7.3   Hematocrit 39.0 - 52.0 % 21.6  25.7  21.7   Platelets 150 - 400 K/uL 297   285       Latest Ref Rng & Units 06/07/2024    5:29 AM 06/04/2024   12:08 PM 06/03/2024    5:12 AM  BMP  Glucose 70 - 99 mg/dL 885  872  97   BUN  8 - 23 mg/dL 37  20  23   Creatinine 0.61 - 1.24 mg/dL 9.15  9.19  9.15   Sodium 135 - 145 mmol/L 137  138  136   Potassium 3.5 - 5.1 mmol/L 4.2  4.5  4.0   Chloride 98 - 111 mmol/L 105  106  105   CO2 22 - 32 mmol/L 23  25  24    Calcium  8.9 - 10.3 mg/dL 8.2  8.7  8.3      Family Communication: n/a  Disposition: Status is: Inpatient Patient with ongoing bleeding, hypotension, heparin  gtt requiring more than 2 midnights  DVT prophylaxis: On systemic anticoagulation       Author: MDALA-GAUSI, GOLDEN PILLOW, MD 06/08/2024 2:58 PM  For on call review [email or web address].    "

## 2024-06-08 NOTE — Progress Notes (Signed)
 Physical Therapy Treatment Patient Details Name: Terry [name] MRN: [id number] DOB: [birth year] Today's Date: 06/08/2024   History of Present Illness GARTH DIFFLEY is a 89 y.o. male with medical history significant of mitral valve replacement,HTN, hyperlipidemia and BPH who was in hospital 1/8-1/23 for a traumatic hematoma left leg followed by compartment syndrome and was admitted to the hospital, multiple debridement, wound VAC placement, 1 unit of blood transfusion and then discharged to a SNF. He comes back today due to feeling weak while transferring from bed to chair and began to feel faint as if he may pass out. He has intermittent bleeding into his dressing and that has been ongoing since last 2 weeks.    PT Comments  Pt making limited progress d/t dizziness in sitting, orthostatic. Pt able to progress OOB prior session however unable to transition beyond EOB today. BP in sitting 113/59, HR 124, with return so supine 106/56, HR 119.  May benefit from abd binder and possibly TED hose on RLE-to assist with drop in BP. Will continue to follow in acute setting.   If plan is discharge home, recommend the following: A little help with walking and/or transfers;A little help with bathing/dressing/bathroom;Assistance with cooking/housework;Assist for transportation;Help with stairs or ramp for entrance   Can travel by private vehicle     No  Equipment Recommendations  BSC/3in1;Wheelchair (measurements PT);Wheelchair cushion (measurements PT)    Recommendations for Other Services       Precautions / Restrictions Precautions Precautions: Fall Recall of Precautions/Restrictions: Intact Precaution/Restrictions Comments: watch orthostatics Restrictions Weight Bearing Restrictions Per Provider Order: No     Mobility  Bed Mobility   Bed Mobility: Supine to Sit, Sit to Supine     Supine to sit: Contact guard Sit to supine: Mod assist, +2 for physical assistance, +2 for  safety/equipment   General bed mobility comments: increased time, assist with LLE; incr assist to return to supine d/t dizziness    Transfers                   General transfer comment:  (unable to attempt d/t dizziness. pt was able to perform 3 lateral scoots along EOB)    Ambulation/Gait                   Stairs             Wheelchair Mobility     Tilt Bed    Modified Rankin (Stroke Patients Only)       Balance   Sitting-balance support: Feet supported, No upper extremity supported Sitting balance-Leahy Scale: Fair Sitting balance - Comments: not challenged                                    Communication Communication Communication: Impaired Factors Affecting Communication: Hearing impaired  Cognition Arousal: Alert Behavior During Therapy: WFL for tasks assessed/performed   PT - Cognitive impairments: No apparent impairments                         Following commands: Intact      Cueing Cueing Techniques: Verbal cues  Exercises General Exercises - Lower Extremity Ankle Circles/Pumps: AROM, Both, 15 reps, Seated    General Comments        Pertinent Vitals/Pain Pain Assessment Pain Assessment: No/denies pain    Home Living  Prior Function            PT Goals (current goals can now be found in the care plan section) Acute Rehab PT Goals Patient Stated Goal: Return Home and regain independence PT Goal Formulation: With patient Time For Goal Achievement: 06/21/24 Potential to Achieve Goals: Good Progress towards PT goals: Not progressing toward goals - comment (orthostatic)    Frequency    Min 3X/week      PT Plan      Co-evaluation              AM-PAC PT 6 Clicks Mobility   Outcome Measure  Help needed turning from your back to your side while in a flat bed without using bedrails?: A Little Help needed moving from lying on your back to  sitting on the side of a flat bed without using bedrails?: A Little Help needed moving to and from a bed to a chair (including a wheelchair)?: A Lot Help needed standing up from a chair using your arms (e.g., wheelchair or bedside chair)?: A Lot Help needed to walk in hospital room?: Total Help needed climbing 3-5 steps with a railing? : Total 6 Click Score: 12    End of Session   Activity Tolerance: Patient limited by fatigue;Treatment limited secondary to medical complications (Comment) (orthostatic) Patient left: in bed;with call bell/phone within reach;with bed alarm set Nurse Communication: Mobility status PT Visit Diagnosis: Difficulty in walking, not elsewhere classified (R26.2);Other abnormalities of gait and mobility (R26.89)     Time: 8574-8544 PT Time Calculation (min) (ACUTE ONLY): 30 min  Charges:    $Therapeutic Activity: 23-37 mins PT General Charges $$ ACUTE PT VISIT: 1 Visit                     Darrien Belter, PT  Acute Rehab Dept [facility]) [phone]  06/08/2024    [facility] 06/08/2024, 3:11 PM

## 2024-06-08 NOTE — TOC Progression Note (Signed)
 Transition of Care [facility]) - Progression Note    Patient Details  Name: Terry [name] MRN: [id number] Date of Birth: 11-Oct-1935  [facility]) CM/SW Contact  Alfonse JONELLE Rex, RN Phone Number: 06/08/2024, 4:31 PM  Clinical Narrative:    Met with patient at bedside to introduce role of INPT CM and review for dc planning, PT recommendation for short term rehab/SNF, pt agreeable, reports he would like to return to [facility]. States he lives in a small house on his niece's property, reports he is independent with his ADLs and functional mobility, reports he does using treking sticks when he walks around the neighborhood. FL2 updated, Tracey w/[facility] notified patient would like to return at discharge.                       Expected Discharge Plan and Services                                               Social Drivers of Health (SDOH) Interventions SDOH Screenings   Food Insecurity: No Food Insecurity (06/08/2024)  Housing: Low Risk (06/08/2024)  Transportation Needs: No Transportation Needs (06/08/2024)  Utilities: Not At Risk (06/08/2024)  Social Connections: Unknown (06/08/2024)  Recent Concern: Social Connections - Socially Isolated (05/20/2024)  Tobacco Use: Medium Risk (06/04/2024)    Readmission Risk Interventions     No data to display

## 2024-06-08 NOTE — Progress Notes (Signed)
 PHARMACY - ANTICOAGULATION CONSULT NOTE  Pharmacy Consult for warfarin + heparin   Indication: mech MVR   Patient Measurements: Height: 5' 11 (180.3 cm) Weight: 85 kg (187 lb 6.3 oz) IBW/kg (Calculated) : 75.3 HEPARIN  DW (KG): 85  Vital Signs: Temp: 97.5 F (36.4 C) (01/26 2143) Temp Source: Oral (01/26 2143) BP: 121/63 (01/26 2143) Pulse Rate: 94 (01/26 2143)  Labs: Recent Labs    06/06/24 0516 06/06/24 1638 06/07/24 0529 06/07/24 1617 06/07/24 1915 06/08/24 0350  HGB 8.3* 8.1* 7.3*  --  8.6* 7.4*  HCT 24.8* 24.9* 21.7*  --  25.7* 21.6*  PLT 273  --  285  --   --  297  LABPROT 29.6* 24.3* 21.7*  --   --  18.3*  INR 2.7* 2.1* 1.8*  --   --  1.4*  HEPARINUNFRC  --   --  1.05* 0.49  --  0.56  CREATININE  --   --  0.84  --   --   --     Estimated Creatinine Clearance: 67.2 mL/min (by C-G formula based on SCr of 0.84 mg/dL).  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 Warfarin reversed with vitamin K po 2.5mg  x1  05/20/24. Patient discharged from [facility] 1/22 to SNF and came back to ER today with significant bleeding through dressing  Home warfarin regimen = 2.5 mg Mon, Wed, Fri and 5 mg Tues, Thur, Sat, Sun  > but discharge plan (1/22) was to continue warfarin dose at higher end of dosing - 5mg  daily until follow up with [facility] clinic or at [facility] and full dose Lovenox  until INR > 2.5   Significant events: 1/25 PM: Warfarin held per MD due to continued bleeding from dressing. Heparin  resumed 1/26 warfarin resumed per CCS recommendations  Today, 06/08/24 Heparin  level 0.56- remains therapeutic on IV heparin  1050 units/hr Hgb down ~1g; PLTc WNL/stable  INR 1.4- subtherapeutic after holding doses 1/22-1/25 Per RN, pt continues to bleed thru dressings which is unchanged & MD aware  Goal of Therapy:  INR 2.5-3.5 Heparin  goal 0.3-0.7 Monitor platelets by anticoagulation protocol: Yes   Plan:  Continue  heparin  infusion at 1050 units/hr Daily CBC & heparin  level while on heparin  Warfarin 5 mg x1 today Check INR daily while holding warfarin Monitor for signs and symptoms of bleeding   Thank you for allowing pharmacy to be a part of this patients care.  Rosaline Millet, PharmD, BCPS 06/08/2024, 5:03 AM

## 2024-06-09 LAB — HEMOGLOBIN AND HEMATOCRIT, BLOOD
HCT: 23.5 % — ABNORMAL LOW (ref 39.0–52.0)
Hemoglobin: 7.8 g/dL — ABNORMAL LOW (ref 13.0–17.0)

## 2024-06-09 LAB — CBC
HCT: 18.7 % — ABNORMAL LOW (ref 39.0–52.0)
Hemoglobin: 6.1 g/dL — CL (ref 13.0–17.0)
MCH: 32.1 pg (ref 26.0–34.0)
MCHC: 32.6 g/dL (ref 30.0–36.0)
MCV: 98.4 fL (ref 80.0–100.0)
Platelets: 286 10*3/uL (ref 150–400)
RBC: 1.9 MIL/uL — ABNORMAL LOW (ref 4.22–5.81)
RDW: 18.5 % — ABNORMAL HIGH (ref 11.5–15.5)
WBC: 18.3 10*3/uL — ABNORMAL HIGH (ref 4.0–10.5)
nRBC: 2.5 % — ABNORMAL HIGH (ref 0.0–0.2)

## 2024-06-09 LAB — PROTIME-INR
INR: 2.3 — ABNORMAL HIGH (ref 0.8–1.2)
Prothrombin Time: 26.3 s — ABNORMAL HIGH (ref 11.4–15.2)

## 2024-06-09 LAB — BASIC METABOLIC PANEL WITH GFR
Anion gap: 7 (ref 5–15)
BUN: 32 mg/dL — ABNORMAL HIGH (ref 8–23)
CO2: 26 mmol/L (ref 22–32)
Calcium: 7.9 mg/dL — ABNORMAL LOW (ref 8.9–10.3)
Chloride: 103 mmol/L (ref 98–111)
Creatinine, Ser: 0.93 mg/dL (ref 0.61–1.24)
GFR, Estimated: >60 mL/min
Glucose, Bld: 116 mg/dL — ABNORMAL HIGH (ref 70–99)
Potassium: 4.1 mmol/L (ref 3.5–5.1)
Sodium: 136 mmol/L (ref 135–145)

## 2024-06-09 LAB — HEPARIN LEVEL (UNFRACTIONATED): Heparin Unfractionated: 0.54 [IU]/mL (ref 0.30–0.70)

## 2024-06-09 LAB — PREPARE RBC (CROSSMATCH)

## 2024-06-09 MED ORDER — WARFARIN SODIUM 2 MG PO TABS
2.0000 mg | ORAL_TABLET | Freq: Once | ORAL | Status: AC
  Administered 2024-06-09: 2 mg via ORAL
  Filled 2024-06-09: qty 1

## 2024-06-09 MED ORDER — ONDANSETRON 4 MG PO TBDP: 4.0000 mg | ORAL_TABLET | Freq: Four times a day (QID) | ORAL | Status: DC | PRN

## 2024-06-09 MED ORDER — SODIUM CHLORIDE 0.9% IV SOLUTION: Freq: Once | INTRAVENOUS | Status: AC

## 2024-06-09 NOTE — Progress Notes (Signed)
 " Progress Note   Patient: Terry [name] FMW:[id number] DOB: 1936/04/24 DOA: 06/04/2024     5 DOS: the patient was seen and examined on 06/09/2024    Brief hospital course: CAI ANFINSON is a 89 y.o. man with PMH of mitral valve replacement on Coumadin  (INR goal 2.5-3.5), hypertension, hyperlipidemia and BPH who had suffered from a traumatic hematoma left leg followed by compartment syndrome and was admitted to the hospital.he underwent multiple debridement, wound VAC placement, 1 unit of blood transfusion and then discharged to a skilled nursing facility.   A day after discharge, he re- presented to the ED due to weakness.  Noted to be bleeding continually from his dressing.  Hemoglobin was 7.1 (7.4 at discharge).  INR was 3.3.  Surgery was consulted. Conservative management was pursued.  Coumadin  was held and then resumed with heparin  bridging ordered as appropriate.  Assessment and Plan:  Leukocytosis Sinus tachycardia Upward trend noted in white cell count from 1/23. Chest x-ray ordered.  Shows no concerning findings. UA unremarkable. Blood cultures ordered. - Follow-up blood cultures. - Continue broad-spectrum antibiotics (Zosyn ).    Acute blood loss anemia Symptomatic blood loss anemia in a patient with chronic anemia, coagulopathy on Coumadin . Patient has received 6 units PRBCs so far. Challenging situation because of need for therapeutic INR in the setting of MVR. -Will continue to monitor hemoglobin closely. - Transfuse as needed.  Traumatic left leg hematoma, status post compartment syndrome and skin grafting with ongoing bleeding Surgery on consult. Surgery recommending conservative management. Patient seen again by surgery on 1/26.  Noted there was an old dressing that had not been removed and beneath this dressing was a large hematoma.  Hematoma was removed with some improvement in bleeding, but bleeding has recurred. TXA applied on 1/27, but bleeding has  continued.  - Surgery following. - Vashe dressing changes daily. -Coumadin  resumed.  History of mitral valve replacement with mechanical valve on chronic anticoagulation with Coumadin : Goal INR 2.5-3.5.  INR  3.3 on presentation INR is 1.4 today. Patient seen by surgery.  Recommending continued conservative management and resumption of Coumadin . -Continue Coumadin  with heparin  bridge until patient is therapeutic.   Hypotension Patient had a hypotensive episode on 1/24. May be related to blood loss. S/p fluid bolus. - Home metoprolol  reordered as short acting, with holding parameters.   Essential hypertension  On metoprolol  at home. - Metoprolol  reordered with holding parameters.   Hyperlipidemia -Continue home Crestor .     Subjective: Patient has no new complaints. Hgb 6.1 this morning. Patient is being transfused.   Patient tachycardic this afternoon.  EKG shows sinus tachycardia  Physical Exam: BP (!) 108/51   Pulse (!) 103   Temp 97.6 F (36.4 C) (Oral)   Resp 18   Ht 5' 11 (1.803 m)   Wt 85 kg   SpO2 100%   BMI 26.14 kg/m    General: Alert, oriented X3  Eyes: Pupils equal, reactive  Oral cavity: moist mucous membranes  Head: Atraumatic, normocephalic  Neck: supple  Chest: clear to auscultation. No crackles, no wheezes  CVS: S1,S2 RRR. No murmurs  Abd: No distention, soft, non-tender. No masses palpable  Extr: No edema   MSK: Left lower extremity in dressing with strikethrough noted.  Neurological: Grossly intact.    Data Reviewed:    Latest Ref Rng & Units 06/09/2024    3:46 AM 06/08/2024    3:50 AM 06/07/2024    7:15 PM  CBC  WBC 4.0 -  10.5 K/uL 18.3  25.3    Hemoglobin 13.0 - 17.0 g/dL 6.1  7.4  8.6   Hematocrit 39.0 - 52.0 % 18.7  21.6  25.7   Platelets 150 - 400 K/uL 286  297        Latest Ref Rng & Units 06/09/2024    3:46 AM 06/07/2024    5:29 AM 06/04/2024   12:08 PM  BMP  Glucose 70 - 99 mg/dL 883  885  872   BUN 8 - 23 mg/dL 32  37   20   Creatinine 0.61 - 1.24 mg/dL 9.06  9.15  9.19   Sodium 135 - 145 mmol/L 136  137  138   Potassium 3.5 - 5.1 mmol/L 4.1  4.2  4.5   Chloride 98 - 111 mmol/L 103  105  106   CO2 22 - 32 mmol/L 26  23  25    Calcium  8.9 - 10.3 mg/dL 7.9  8.2  8.7      Family Communication: n/a  Disposition: Status is: Inpatient Patient with ongoing bleeding, hypotension, heparin  gtt requiring more than 2 midnights  DVT prophylaxis: On systemic anticoagulation       Author: MDALA-GAUSI, Elese Rane AGATHA, MD 06/09/2024 11:41 AM  For on call review [email or web address].    "

## 2024-06-09 NOTE — Progress Notes (Signed)
 Hemoglobin 6.1. Messaged Isaiah Lever.

## 2024-06-09 NOTE — Progress Notes (Signed)
 PHARMACY - ANTICOAGULATION CONSULT NOTE  Pharmacy Consult for warfarin + heparin   Indication: mech MVR   Patient Measurements: Height: 5' 11 (180.3 cm) Weight: 85 kg (187 lb 6.3 oz) IBW/kg (Calculated) : 75.3 HEPARIN  DW (KG): 85  Vital Signs: Temp: 97.6 F (36.4 C) (01/28 1100) Temp Source: Oral (01/28 1100) BP: 108/51 (01/28 1100) Pulse Rate: 103 (01/28 1100)  Labs: Recent Labs    06/07/24 0529 06/07/24 1617 06/07/24 1915 06/08/24 0350 06/09/24 0346  HGB 7.3*  --  8.6* 7.4* 6.1*  HCT 21.7*  --  25.7* 21.6* 18.7*  PLT 285  --   --  297 286  LABPROT 21.7*  --   --  18.3* 26.3*  INR 1.8*  --   --  1.4* 2.3*  HEPARINUNFRC 1.05* 0.49  --  0.56 0.54  CREATININE 0.84  --   --   --  0.93    Estimated Creatinine Clearance: 60.7 mL/min (by C-G formula based on SCr of 0.93 mg/dL).  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 Warfarin reversed with vitamin K po 2.5mg  x1  05/20/24. Patient discharged from [facility] 1/22 to SNF and came back to ER today with significant bleeding through dressing  Home warfarin regimen = 2.5 mg Mon, Wed, Fri and 5 mg Tues, Thur, Sat, Sun  > but discharge plan (1/22) was to continue warfarin dose at higher end of dosing - 5mg  daily until follow up with [facility] clinic or at [facility] and full dose Lovenox  until INR > 2.5   Significant events: 1/25 PM: Warfarin held per MD due to continued bleeding from dressing. Heparin  resumed 1/26 warfarin resumed per CCS recommendations  Today, 06/09/24 Heparin  level 0.54- remains therapeutic on IV heparin  1050 units/hr Hgb down to 6.1; 1 unit of PRBC ordered PLTc WNL/stable  INR 2.3 - subtherapeutic after holding doses 1/22-1/25. Big increase from 2.4 to 2.3  Per RN, pt continues to bleed thru dressings which is unchanged & MD aware. TXA applied topically on 1/27   Goal of Therapy:  INR 2.5-3.5 Heparin  goal 0.3-0.7 Monitor platelets by  anticoagulation protocol: Yes   Plan:  Continue heparin  infusion at 1050 units/hr Daily CBC & heparin  level while on heparin  Warfarin 2 mg x1 today Check INR daily   Monitor for signs and symptoms of bleeding    Dolphus Roller, PharmD, BCPS 06/09/2024 12:25 PM

## 2024-06-09 NOTE — Plan of Care (Signed)
  Problem: Pain Managment: Goal: General experience of comfort will improve and/or be controlled Outcome: Progressing   Problem: Safety: Goal: Ability to remain free from injury will improve Outcome: Progressing

## 2024-06-10 DIAGNOSIS — D62 Acute posthemorrhagic anemia: Secondary | ICD-10-CM | POA: Diagnosis not present

## 2024-06-10 LAB — HEMOGLOBIN AND HEMATOCRIT, BLOOD
HCT: 21.7 % — ABNORMAL LOW (ref 39.0–52.0)
Hemoglobin: 6.6 g/dL — CL (ref 13.0–17.0)

## 2024-06-10 LAB — BASIC METABOLIC PANEL WITH GFR
Anion gap: 7 (ref 5–15)
BUN: 31 mg/dL — ABNORMAL HIGH (ref 8–23)
CO2: 26 mmol/L (ref 22–32)
Calcium: 7.7 mg/dL — ABNORMAL LOW (ref 8.9–10.3)
Chloride: 105 mmol/L (ref 98–111)
Creatinine, Ser: 0.95 mg/dL (ref 0.61–1.24)
GFR, Estimated: >60 mL/min
Glucose, Bld: 117 mg/dL — ABNORMAL HIGH (ref 70–99)
Potassium: 4.5 mmol/L (ref 3.5–5.1)
Sodium: 138 mmol/L (ref 135–145)

## 2024-06-10 LAB — CBC
HCT: 23.2 % — ABNORMAL LOW (ref 39.0–52.0)
Hemoglobin: 7.4 g/dL — ABNORMAL LOW (ref 13.0–17.0)
MCH: 30.7 pg (ref 26.0–34.0)
MCHC: 31.9 g/dL (ref 30.0–36.0)
MCV: 96.3 fL (ref 80.0–100.0)
Platelets: 245 10*3/uL (ref 150–400)
RBC: 2.41 MIL/uL — ABNORMAL LOW (ref 4.22–5.81)
RDW: 18 % — ABNORMAL HIGH (ref 11.5–15.5)
WBC: 14.2 10*3/uL — ABNORMAL HIGH (ref 4.0–10.5)
nRBC: 0.8 % — ABNORMAL HIGH (ref 0.0–0.2)

## 2024-06-10 LAB — PREPARE RBC (CROSSMATCH)

## 2024-06-10 LAB — PROTIME-INR
INR: 2.6 — ABNORMAL HIGH (ref 0.8–1.2)
Prothrombin Time: 29.1 s — ABNORMAL HIGH (ref 11.4–15.2)

## 2024-06-10 LAB — HEPARIN LEVEL (UNFRACTIONATED): Heparin Unfractionated: 0.7 [IU]/mL (ref 0.30–0.70)

## 2024-06-10 MED ORDER — SODIUM CHLORIDE 0.9 % IV BOLUS
250.0000 mL | Freq: Once | INTRAVENOUS | Status: AC
  Administered 2024-06-10: 250 mL via INTRAVENOUS

## 2024-06-10 MED ORDER — SODIUM CHLORIDE 0.9% IV SOLUTION: Freq: Once | INTRAVENOUS | Status: AC

## 2024-06-10 MED ORDER — WARFARIN SODIUM 2.5 MG PO TABS
2.5000 mg | ORAL_TABLET | Freq: Once | ORAL | Status: AC
  Administered 2024-06-10: 2.5 mg via ORAL
  Filled 2024-06-10: qty 1

## 2024-06-10 NOTE — TOC Progression Note (Signed)
 Transition of Care [facility]) - Progression Note    Patient Details  Name: Terry [name] MRN: [id number] Date of Birth: Jan 20, 1936  [facility]) CM/SW Contact  Alfonse JONELLE Rex, RN Phone Number: 06/10/2024, 12:30 PM  Clinical Narrative:  Plan dc to [facility] for short term rehab when medically stable. Will need insurance auth initiated.                        Expected Discharge Plan and Services                                               Social Drivers of Health (SDOH) Interventions SDOH Screenings   Food Insecurity: No Food Insecurity (06/08/2024)  Housing: Low Risk (06/08/2024)  Transportation Needs: No Transportation Needs (06/08/2024)  Utilities: Not At Risk (06/08/2024)  Social Connections: Unknown (06/08/2024)  Recent Concern: Social Connections - Socially Isolated (05/20/2024)  Tobacco Use: Medium Risk (06/04/2024)    Readmission Risk Interventions     No data to display

## 2024-06-10 NOTE — Progress Notes (Signed)
 " Progress Note   Patient: Terry [name] FMW:[id number] DOB: 04-Jul-1935 DOA: 06/04/2024     6 DOS: the patient was seen and examined on 06/10/2024    Brief hospital course: Terry [name] is a 89 y.o. man with PMH of mitral valve replacement on Coumadin  (INR goal 2.5-3.5), hypertension, hyperlipidemia and BPH who had suffered from a traumatic hematoma left leg followed by compartment syndrome and was admitted to the hospital.he underwent multiple debridement, wound VAC placement, 1 unit of blood transfusion and then discharged to a skilled nursing facility.   A day after discharge, he re- presented to the ED due to weakness.  Noted to be bleeding continually from his dressing.  Hemoglobin was 7.1 (7.4 at discharge).  INR was 3.3.  Surgery was consulted. Conservative management was pursued.  Coumadin  was held and then resumed with heparin  bridging ordered as appropriate.  Assessment and Plan:  Leukocytosis Sinus tachycardia Upward trend noted in white cell count from 1/23. Chest x-ray ordered.  Shows no concerning findings. UA unremarkable. Blood cultures so far negative, continue IV Zosyn .  Acute blood loss anemia Symptomatic blood loss anemia in a patient with chronic anemia, coagulopathy on Coumadin . Patient has received 6 units PRBCs so far. Challenging situation because of need for therapeutic INR in the setting of MVR. -Will continue to monitor hemoglobin closely. - Transfuse as needed. - Hemoglobin 7.4 on 1/29.  Traumatic left leg hematoma, status post compartment syndrome and skin grafting with ongoing bleeding Surgery on consult. Surgery recommending conservative management. Patient seen again by surgery on 1/26.  Noted there was an old dressing that had not been removed and beneath this dressing was a large hematoma.  Hematoma was removed with some improvement in bleeding, but bleeding has recurred. TXA applied on 1/27, but bleeding has continued.  - Surgery  following. - Vashe dressing changes daily. -Coumadin  resumed.  History of mitral valve replacement with mechanical valve on chronic anticoagulation with Coumadin : Goal INR 2.5-3.5.  INR  3.3 on presentation INR is 1.4 ->2.3->2.6.  Continue heparin /Coumadin  bridge.  Pharmacy assisting.  Will DC heparin  once INR more than 2.5 in 2 consecutive days.   Hypotension -Secondary to blood loss. -Orthostatic hypotension noted 1/29.  Will give 250 cc bolus.   Essential hypertension  On metoprolol  at home. - Metoprolol  reordered with holding parameters.   Hyperlipidemia -Continue home Crestor .     Subjective: Patient was up with PT today, blood pressure 70s systolic in sitting position.  This improved to 90s after few minutes.  Patient feels weak in general.  No other complaints.  Continues to have bloodstained dressing.  No brisk bleeding noted.  Last blood transfusion 1/28.  Hemoglobin improved to 7.4 today  Physical Exam: BP 113/62 (BP Location: Left Arm)   Pulse 100   Temp 97.7 F (36.5 C) (Oral)   Resp 18   Ht 5' 11 (1.803 m)   Wt 85 kg   SpO2 100%   BMI 26.14 kg/m    General: Alert, oriented X3  Eyes: Pupils equal, reactive  Oral cavity: moist mucous membranes  Head: Atraumatic, normocephalic  Neck: supple  Chest: clear to auscultation. No crackles, no wheezes  CVS: S1,S2 RRR. No murmurs  Abd: No distention, soft, non-tender. No masses palpable  Extr: No edema   MSK: Left lower extremity in dressing with strikethrough noted.  Neurological: Grossly intact.    Data Reviewed:    Latest Ref Rng & Units 06/10/2024    5:33 AM 06/09/2024  9:02 PM 06/09/2024    3:46 AM  CBC  WBC 4.0 - 10.5 K/uL 14.2   18.3   Hemoglobin 13.0 - 17.0 g/dL 7.4  7.8  6.1   Hematocrit 39.0 - 52.0 % 23.2  23.5  18.7   Platelets 150 - 400 K/uL 245   286       Latest Ref Rng & Units 06/10/2024    5:33 AM 06/09/2024    3:46 AM 06/07/2024    5:29 AM  BMP  Glucose 70 - 99 mg/dL 882  883  885    BUN 8 - 23 mg/dL 31  32  37   Creatinine 0.61 - 1.24 mg/dL 9.04  9.06  9.15   Sodium 135 - 145 mmol/L 138  136  137   Potassium 3.5 - 5.1 mmol/L 4.5  4.1  4.2   Chloride 98 - 111 mmol/L 105  103  105   CO2 22 - 32 mmol/L 26  26  23    Calcium  8.9 - 10.3 mg/dL 7.7  7.9  8.2      Family Communication: n/a  Disposition: Status is: Inpatient Patient with ongoing bleeding, hypotension, heparin  gtt requiring more than 2 midnights  DVT prophylaxis: On systemic anticoagulation       Author: Derryl Duval, MD 06/10/2024 11:31 AM  For on call review [email or web address].    "

## 2024-06-10 NOTE — Plan of Care (Signed)

## 2024-06-10 NOTE — Progress Notes (Signed)
 PHARMACY - ANTICOAGULATION CONSULT NOTE  Pharmacy Consult for warfarin + heparin   Indication: mech MVR   Patient Measurements: Height: 5' 11 (180.3 cm) Weight: 85 kg (187 lb 6.3 oz) IBW/kg (Calculated) : 75.3 HEPARIN  DW (KG): 85  Vital Signs: Temp: 97.7 F (36.5 C) (01/29 0605) Temp Source: Oral (01/29 0605) BP: 113/62 (01/29 0605) Pulse Rate: 100 (01/29 0605)  Labs: Recent Labs    06/08/24 0350 06/09/24 0346 06/09/24 2102 06/10/24 0533  HGB 7.4* 6.1* 7.8* 7.4*  HCT 21.6* 18.7* 23.5* 23.2*  PLT 297 286  --  245  LABPROT 18.3* 26.3*  --  29.1*  INR 1.4* 2.3*  --  2.6*  HEPARINUNFRC 0.56 0.54  --  0.70  CREATININE  --  0.93  --  0.95    Estimated Creatinine Clearance: 59.4 mL/min (by C-G formula based on SCr of 0.95 mg/dL).  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 Warfarin reversed with vitamin K po 2.5mg  x1  05/20/24. Patient discharged from [facility] 1/22 to SNF and came back to ER today with significant bleeding through dressing  Home warfarin regimen = 2.5 mg Mon, Wed, Fri and 5 mg Tues, Thur, Sat, Sun  > but discharge plan (1/22) was to continue warfarin dose at higher end of dosing - 5mg  daily until follow up with [facility] clinic or at [facility] and full dose Lovenox  until INR > 2.5   Significant events: 1/25 PM: Warfarin held per MD due to continued bleeding from dressing. Heparin  resumed 1/26 warfarin resumed per CCS recommendations  Today, 06/10/24 Heparin  level 0.70- remains therapeutic on IV heparin  1050 units/hr Hgb up to 7.4 after 1 unit of PRBC transfused PLTc WNL/stable  INR 2.6 - therapeutic after holding doses 1/22-1/25.   Discussed with MD, continue heparin  through 1/29  Per RN, pt bled thru dressings which is unchanged & MD aware. TXA applied topically on 1/27   Goal of Therapy:  INR 2.5-3.5 Heparin  goal 0.3-0.7 Monitor platelets by anticoagulation protocol: Yes   Plan:  Continue  heparin  infusion at 1050 units/hr Daily CBC & heparin  level while on heparin  Warfarin 2.5 mg x1 today Check INR daily   Monitor for signs and symptoms of bleeding    Dolphus Roller, PharmD, BCPS 06/10/2024 9:56 AM

## 2024-06-10 NOTE — Progress Notes (Signed)
 Physical Therapy Treatment Patient Details Name: Terry [name] MRN: [id number] DOB: 1935-09-22 Today's Date: 06/10/2024   History of Present Illness Terry [name] is a 89 y.o. male with medical history significant of mitral valve replacement,HTN, hyperlipidemia and BPH who was in hospital 1/8-1/23 for a traumatic hematoma left leg followed by compartment syndrome and was admitted to the hospital, multiple debridement, wound VAC placement, 1 unit of blood transfusion and then discharged to a SNF. He comes back today due to feeling weak while transferring from bed to chair and began to feel faint as if he may pass out. He has intermittent bleeding into his dressing and that has been ongoing since last 2 weeks.    PT Comments  AxO x 3 pleasant and very motivated.  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 Assisted Nursing with peri care after Pt was found to be incont BM.  Assisted with side to side rolling and support to Pt's L LE.   Orthostatic BP's Supine             BP 100/51  HR 85  RA 100% EOB                 BP  77/44   HR 93  RA 100%  MAX c/o dizziness EOB  x 4 min   BP 90/54  HR 94 100% Semi reclined   BP 98/53  HR 96  RA 100% decreased c/o dizziness    Pt was unable to amb due to positive Orthostasis.  Positioned in recliner to comfort. LPT has rec Pt will need ST Rehab at SNF to address mobility and functional decline prior to safely returning home.    If plan is discharge home, recommend the following: A little help with walking and/or transfers;A little help with bathing/dressing/bathroom;Assistance with cooking/housework;Assist for transportation;Help with stairs or ramp for entrance   Can travel by private vehicle     No  Equipment Recommendations  BSC/3in1;Wheelchair (measurements PT);Wheelchair cushion (measurements PT)    Recommendations for Other Services       Precautions / Restrictions Precautions Precautions:  Fall Precaution/Restrictions Comments: orthostatic Restrictions Weight Bearing Restrictions Per Provider Order: No LLE Weight Bearing Per Provider Order: Weight bearing as tolerated     Mobility  Bed Mobility Overal bed mobility: Needs Assistance Bed Mobility: Supine to Sit     Supine to sit: Min assist     General bed mobility comments: increased time, assist with LLE; MAX c/o dizziness immediately with position change.    Transfers Overall transfer level: Needs assistance Equipment used: None Transfers: Bed to chair/wheelchair/BSC   Stand pivot transfers: Max assist, +2 physical assistance, +2 safety/equipment         General transfer comment: Quickly assisted from elevated bed to recliner due to positive Orthostatic BP symtomatic.    Ambulation/Gait               General Gait Details: transfer only due to positive Orhtostatic with MAZX c/oi dizziness   Stairs             Wheelchair Mobility     Tilt Bed    Modified Rankin (Stroke Patients Only)       Balance  Communication Communication Communication: Impaired Factors Affecting Communication: Hearing impaired  Cognition Arousal: Alert Behavior During Therapy: WFL for tasks assessed/performed   PT - Cognitive impairments: No apparent impairments                       PT - Cognition Comments: AxO x 3 pleasant and very motivated.  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 Following commands: Intact      Cueing Cueing Techniques: Verbal cues  Exercises      General Comments        Pertinent Vitals/Pain Pain Assessment Pain Assessment: No/denies pain    Home Living                          Prior Function            PT Goals (current goals can now be found in the care plan section) Progress towards PT goals: Progressing toward goals    Frequency    Min  3X/week      PT Plan      Co-evaluation              AM-PAC PT 6 Clicks Mobility   Outcome Measure  Help needed turning from your back to your side while in a flat bed without using bedrails?: A Little Help needed moving from lying on your back to sitting on the side of a flat bed without using bedrails?: A Little Help needed moving to and from a bed to a chair (including a wheelchair)?: A Lot Help needed standing up from a chair using your arms (e.g., wheelchair or bedside chair)?: A Lot Help needed to walk in hospital room?: Total Help needed climbing 3-5 steps with a railing? : Total 6 Click Score: 12    End of Session Equipment Utilized During Treatment: Gait belt Activity Tolerance: Treatment limited secondary to medical complications (Comment) Patient left: in chair;with call bell/phone within reach;with chair alarm set Nurse Communication: Mobility status PT Visit Diagnosis: Difficulty in walking, not elsewhere classified (R26.2);Other abnormalities of gait and mobility (R26.89)     Time: 0930-1010 PT Time Calculation (min) (ACUTE ONLY): 40 min  Charges:    $Therapeutic Activity: 38-52 mins PT General Charges $$ ACUTE PT VISIT: 1 Visit                     {Dashia Caldeira  PTA Acute  Rehabilitation Services Office M-F          [phone]

## 2024-06-11 DIAGNOSIS — D62 Acute posthemorrhagic anemia: Secondary | ICD-10-CM | POA: Diagnosis not present

## 2024-06-11 LAB — CBC
HCT: 22.7 % — ABNORMAL LOW (ref 39.0–52.0)
Hemoglobin: 7.4 g/dL — ABNORMAL LOW (ref 13.0–17.0)
MCH: 31.5 pg (ref 26.0–34.0)
MCHC: 32.6 g/dL (ref 30.0–36.0)
MCV: 96.6 fL (ref 80.0–100.0)
Platelets: 212 10*3/uL (ref 150–400)
RBC: 2.35 MIL/uL — ABNORMAL LOW (ref 4.22–5.81)
RDW: 16.4 % — ABNORMAL HIGH (ref 11.5–15.5)
WBC: 9.5 10*3/uL (ref 4.0–10.5)
nRBC: 0.5 % — ABNORMAL HIGH (ref 0.0–0.2)

## 2024-06-11 LAB — TYPE AND SCREEN
ABO/RH(D): A POS
Antibody Screen: NEGATIVE
Unit division: 0
Unit division: 0
Unit division: 0
Unit division: 0
Unit division: 0

## 2024-06-11 LAB — BPAM RBC
Blood Product Expiration Date: 202602202359
Blood Product Expiration Date: 202602222359
Blood Product Expiration Date: 202602222359
Blood Product Expiration Date: 202602272359
Blood Product Expiration Date: 202602272359
ISSUE DATE / TIME: 202601261125
ISSUE DATE / TIME: 202601280752
ISSUE DATE / TIME: 202601281442
ISSUE DATE / TIME: 202601292139
ISSUE DATE / TIME: 202601300400
Unit Type and Rh: 6200
Unit Type and Rh: 6200
Unit Type and Rh: 6200
Unit Type and Rh: 6200
Unit Type and Rh: 6200

## 2024-06-11 LAB — BASIC METABOLIC PANEL WITH GFR
Anion gap: 6 (ref 5–15)
BUN: 26 mg/dL — ABNORMAL HIGH (ref 8–23)
CO2: 27 mmol/L (ref 22–32)
Calcium: 7.6 mg/dL — ABNORMAL LOW (ref 8.9–10.3)
Chloride: 103 mmol/L (ref 98–111)
Creatinine, Ser: 0.91 mg/dL (ref 0.61–1.24)
GFR, Estimated: >60 mL/min
Glucose, Bld: 97 mg/dL (ref 70–99)
Potassium: 4.1 mmol/L (ref 3.5–5.1)
Sodium: 136 mmol/L (ref 135–145)

## 2024-06-11 LAB — PROTIME-INR
INR: 3.2 — ABNORMAL HIGH (ref 0.8–1.2)
Prothrombin Time: 34.1 s — ABNORMAL HIGH (ref 11.4–15.2)

## 2024-06-11 LAB — PREPARE RBC (CROSSMATCH)

## 2024-06-11 LAB — HEPARIN LEVEL (UNFRACTIONATED): Heparin Unfractionated: 0.71 [IU]/mL — ABNORMAL HIGH (ref 0.30–0.70)

## 2024-06-11 LAB — HEMOGLOBIN AND HEMATOCRIT, BLOOD
HCT: 27.7 % — ABNORMAL LOW (ref 39.0–52.0)
Hemoglobin: 9.2 g/dL — ABNORMAL LOW (ref 13.0–17.0)

## 2024-06-11 MED ORDER — SODIUM CHLORIDE 0.9% IV SOLUTION: Freq: Once | INTRAVENOUS | Status: AC

## 2024-06-11 MED ORDER — WARFARIN SODIUM 1 MG PO TABS
1.0000 mg | ORAL_TABLET | Freq: Once | ORAL | Status: AC
  Administered 2024-06-11: 1 mg via ORAL
  Filled 2024-06-11: qty 1

## 2024-06-11 NOTE — Plan of Care (Signed)
" °  Problem: Education: Goal: Knowledge of General Education information will improve Description: Including pain rating scale, medication(s)/side effects and non-pharmacologic comfort measures Outcome: Adequate for Discharge   Problem: Health Behavior/Discharge Planning: Goal: Ability to manage health-related needs will improve Outcome: Progressing   Problem: Clinical Measurements: Goal: Ability to maintain clinical measurements within normal limits will improve Outcome: Progressing Goal: Will remain free from infection Outcome: Progressing Goal: Diagnostic test results will improve Outcome: Progressing Goal: Respiratory complications will improve Outcome: Progressing Goal: Cardiovascular complication will be avoided Outcome: Progressing   Problem: Activity: Goal: Risk for activity intolerance will decrease Outcome: Progressing   Problem: Coping: Goal: Level of anxiety will decrease Outcome: Progressing   Problem: Pain Managment: Goal: General experience of comfort will improve and/or be controlled Outcome: Progressing   Problem: Safety: Goal: Ability to remain free from injury will improve Outcome: Progressing   Problem: Skin Integrity: Goal: Risk for impaired skin integrity will decrease Outcome: Progressing   "

## 2024-06-11 NOTE — Progress Notes (Signed)
" °   06/11/24 1300  Spiritual Encounters  Type of Visit Initial  Care provided to: Patient  Referral source Nurse (RN/NT/LPN)  Reason for visit Routine spiritual support  OnCall Visit No   I responded to a spiritual care consult request by Dunzweiler, Ronal POUR, RN on patient's behalf.  Mr Swor welcomed my visit. He was affable and positive in affect. He debriefed around his current condition. He reflected on friends and engaged in some life review. He named that he is the last surviving member of his family. He named a belief in God and a expressed a hopeful outlook but a realistic one, expressed satisfaction with his life.  I provided relational support and active and reflective listening. I facilitated some story sharing (a meaningful trip to [place]) and explored sources of meaning and support. I celebrated ongoing connections with friends and the significance of connection. I offered prayer with consent and words of encouragement.  Will continue to follow, Banks Chaikin L. Delores HERO.Div "

## 2024-06-11 NOTE — Progress Notes (Signed)
 If another unit is needed to be transfused, we will need another Transfuse 1 unit order.  Thank you.

## 2024-06-11 NOTE — Plan of Care (Deleted)
 Patient received 1 unit PRBC this shift. Blood pressure is still soft, but patient was able to transfer to and from the bed/chair without any dizziness or difficulty. Patient was a little bit emotional and tearful today stating that he is overcome with emotions and thankful for the care he has been receiving here. Chaplain came to visit and was able to pray with the patient, he said this helped him greatly.

## 2024-06-11 NOTE — Progress Notes (Signed)
 Occupational Therapy Treatment Patient Details Name: Terry [name] MRN: [id number] DOB: January 01, 1936 Today's Date: 06/11/2024   History of present illness Terry [name] is a 89 yr old male with medical history significant of mitral valve replacement,HTN, hyperlipidemia and BPH who was in hospital 1/8-1/23 for a traumatic hematoma left leg followed by compartment syndrome and was admitted to the hospital, multiple debridement, wound VAC placement, 1 unit of blood transfusion and then discharged to a SNF. He comes back today due to feeling weak while transferring from bed to chair and began to feel faint as if he may pass out. He has intermittent bleeding into his dressing and that has been ongoing since last 2 weeks.   OT comments  The pt required min assist for supine to sit. He presented with good static sitting EOB and fair+ dynamic sitting balance. OT donned his abdominal binder and R ted hose with him seated EOB. He then required mod assist to stand and to step-pivot to the chair using a RW. Once seated upright in the chair, he performed upper body grooming with set-up/supervision. He denied feelings of dizziness throughout the entirety of the session. His blood pressure was taken and noted to be 108/55 seated EOB & 103/50 seated in the chair. He presented with good effort and participation. Continue OT plan of care to address his functional limitations and decreased independence with self care tasks. Patient will benefit from continued inpatient follow up therapy, <3 hours/day.       If plan is discharge home, recommend the following:  A lot of help with walking and/or transfers;A lot of help with bathing/dressing/bathroom;Assistance with cooking/housework;Help with stairs or ramp for entrance;Assist for transportation   Equipment Recommendations  BSC/3in1    Recommendations for Other Services      Precautions / Restrictions Precautions Precautions: Fall Restrictions Weight  Bearing Restrictions Per Provider Order: No LLE Weight Bearing Per Provider Order: Weight bearing as tolerated       Mobility Bed Mobility Overal bed mobility: Needs Assistance Bed Mobility: Supine to Sit     Supine to sit: Min assist, HOB elevated, Used rails          Transfers Overall transfer level: Needs assistance Equipment used: Rolling walker (2 wheels) Transfers: Sit to/from Stand, Bed to chair/wheelchair/BSC Sit to Stand: Mod assist Stand pivot transfers: Mod assist         General transfer comment: pt required cues to push up from the bed with at least 1 upper extremity and to correct forward flexed posture in standing; cues also needed for walker placement during transfer to the chair     Balance     Sitting balance-Leahy Scale: Fair         Standing balance comment: Min to mod assist with RW                ADL either performed or assessed with clinical judgement   ADL Overall ADL's : Needs assistance/impaired     Grooming: Set up;Sitting Grooming Details (indicate cue type and reason): He performed face washing and teeth brushing seated in the bedside chair.           Upper Body Dressing Details (indicate cue type and reason): He required max assist to donn abdominal binder seated EOB. Lower Body Dressing: Maximal assistance;Sitting/lateral leans Lower Body Dressing Details (indicate cue type and reason): He required total assist to donn his R ted hose seated EOB.  Communication Communication Factors Affecting Communication: Hearing impaired   Cognition Arousal: Alert Behavior During Therapy: WFL for tasks assessed/performed Cognition: No apparent impairments        Following commands: Intact        Cueing   Cueing Techniques: Verbal cues             Pertinent Vitals/ Pain       Pain Assessment Pain Assessment: No/denies pain   Frequency  Min 2X/week        Progress Toward Goals  OT  Goals(current goals can now be found in the care plan section)  Progress towards OT goals: Progressing toward goals  Acute Rehab OT Goals Patient Stated Goal: to get better OT Goal Formulation: With patient Time For Goal Achievement: 06/25/24 Potential to Achieve Goals: Good  Plan         AM-PAC OT 6 Clicks Daily Activity     Outcome Measure   Help from another person eating meals?: None Help from another person taking care of personal grooming?: A Little Help from another person toileting, which includes using toliet, bedpan, or urinal?: A Lot Help from another person bathing (including washing, rinsing, drying)?: A Lot Help from another person to put on and taking off regular upper body clothing?: A Little Help from another person to put on and taking off regular lower body clothing?: A Lot 6 Click Score: 16    End of Session Equipment Utilized During Treatment: Gait belt;Rolling walker (2 wheels)  OT Visit Diagnosis: Other abnormalities of gait and mobility (R26.89);Muscle weakness (generalized) (M62.81);Unsteadiness on feet (R26.81)   Activity Tolerance Patient tolerated treatment well   Patient Left with chair alarm set;in chair;with call bell/phone within reach   Nurse Communication Mobility status        Time: 8554-8482 OT Time Calculation (min): 32 min  Charges: OT General Charges $OT Visit: 1 Visit OT Treatments $Self Care/Home Management : 8-22 mins $Therapeutic Activity: 8-22 mins    Delanna JINNY Lesches, OTR/L 06/11/2024, 6:03 PM

## 2024-06-11 NOTE — Progress Notes (Signed)
 PHARMACY - ANTICOAGULATION CONSULT NOTE  Pharmacy Consult for warfarin + heparin   Indication: mech MVR   Patient Measurements: Height: 5' 11 (180.3 cm) Weight: 85 kg (187 lb 6.3 oz) IBW/kg (Calculated) : 75.3 HEPARIN  DW (KG): 85  Vital Signs: Temp: 97.9 F (36.6 C) (01/30 0540) Temp Source: Oral (01/30 0540) BP: 110/55 (01/30 0540) Pulse Rate: 84 (01/30 0540)  Labs: Recent Labs    06/09/24 0346 06/09/24 2102 06/10/24 0533 06/10/24 1726 06/11/24 0352  HGB 6.1*   < > 7.4* 6.6* 7.4*  HCT 18.7*   < > 23.2* 21.7* 22.7*  PLT 286  --  245  --  212  LABPROT 26.3*  --  29.1*  --  34.1*  INR 2.3*  --  2.6*  --  3.2*  HEPARINUNFRC 0.54  --  0.70  --  0.71*  CREATININE 0.93  --  0.95  --  0.91   < > = values in this interval not displayed.    Estimated Creatinine Clearance: 62.1 mL/min (by C-G formula based on SCr of 0.91 mg/dL).  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 Warfarin reversed with vitamin K po 2.5mg  x1  05/20/24. Patient discharged from [facility] to SNF and came back to ER today with significant bleeding through dressing  Home warfarin regimen = 2.5 mg Mon, Wed, Fri and 5 mg Tues, Thur, Sat, Sun  > but discharge plan (1/22) was to continue warfarin dose at higher end of dosing - 5mg  daily until follow up with [facility] clinic or at [facility] and full dose Lovenox  until INR > 2.5   Significant events: 1/25 PM: Warfarin held per MD due to continued bleeding from dressing. Heparin  resumed 1/26 warfarin resumed per [facility] recommendations 1/27: Per RN, pt bled thru dressings which is unchanged & MD aware. TXA applied topically.  Today, 06/11/24 Heparin  level 0.71- slightly supratherapeutic on IV heparin  1050 units/hr Hgb remains 7.4 (2 units PRBC ordered - 1 unit given as of this time) PLTc WNL/stable  INR 3.2 - therapeutic after holding doses 1/22-1/25.   Discussed with MD, continue heparin  through 1/30   Per RN, pt bled thru dressings which is unchanged & MD aware. TXA applied topically on 1/27   Goal of Therapy:  INR 2.5-3.5 Heparin  goal 0.3-0.7 Monitor platelets by anticoagulation protocol: Yes   Plan:  Decrease heparin  infusion to 1000 units/hr Daily CBC & heparin  level while on heparin  Warfarin 1 mg x1 today Check INR daily   Monitor for signs and symptoms of bleeding  Arvin Gauss, PharmD 06/11/2024 5:45 AM

## 2024-06-11 NOTE — Progress Notes (Signed)
 Order noted to transfuse 2 units of PRBCs.  The patient has 2 IV sites, a Heparin  gtt, and the other had IV Zosyn  infusing.  Attempt to obtain 3rd IV site was unsuccessful and an order was placed for an IV consult. Shortly after the failed IV attempt, the Zosyn  completed and the blood was started through that site. Once the blood finished, my plan was to push back the time of the Zosyn  and run the next unit of blood. The blood slip was printed and taken to the blood bank @ 0115.  When the lab worker was verifying the blood, she noted the patient's Type & cross sample had expired at [facility] and therefore could not release the unit. She explained he would need a new type & cross sample before another unit could be released.  Lavanda Horns, NP was notified for new orders. (See MD orders)  The next dose of Zosyn  was then given while having to wait on the labs to be drawn and the next unit prepared. Lavanda Horns, NP did suggest that if the Hgb was adequate, we may wait to transfuse the next unit.

## 2024-06-11 NOTE — Plan of Care (Signed)
 Patient received 1 unit PRBC this shift. Blood pressure is still soft, but patient was able to transfer to and from the bed/chair without any dizziness or difficulty. Patient was a little bit emotional and tearful today stating that he is overcome with emotions and thankful for the care he has been receiving here. Chaplain came to visit and was able to pray with the patient, he said this helped him greatly.   Problem: Education: Goal: Knowledge of General Education information will improve Description: Including pain rating scale, medication(s)/side effects and non-pharmacologic comfort measures Outcome: Progressing   Problem: Activity: Goal: Risk for activity intolerance will decrease Outcome: Progressing   Problem: Coping: Goal: Level of anxiety will decrease Outcome: Progressing

## 2024-06-12 DIAGNOSIS — D62 Acute posthemorrhagic anemia: Secondary | ICD-10-CM | POA: Diagnosis not present

## 2024-06-12 LAB — CBC
HCT: 29 % — ABNORMAL LOW (ref 39.0–52.0)
Hemoglobin: 9.1 g/dL — ABNORMAL LOW (ref 13.0–17.0)
MCH: 30.6 pg (ref 26.0–34.0)
MCHC: 31.4 g/dL (ref 30.0–36.0)
MCV: 97.6 fL (ref 80.0–100.0)
Platelets: 227 10*3/uL (ref 150–400)
RBC: 2.97 MIL/uL — ABNORMAL LOW (ref 4.22–5.81)
RDW: 16.6 % — ABNORMAL HIGH (ref 11.5–15.5)
WBC: 7.7 10*3/uL (ref 4.0–10.5)
nRBC: 0 % (ref 0.0–0.2)

## 2024-06-12 LAB — PROTIME-INR
INR: 2.4 — ABNORMAL HIGH (ref 0.8–1.2)
Prothrombin Time: 27.2 s — ABNORMAL HIGH (ref 11.4–15.2)

## 2024-06-12 MED ORDER — WARFARIN SODIUM 4 MG PO TABS
4.0000 mg | ORAL_TABLET | Freq: Once | ORAL | Status: AC
  Administered 2024-06-12: 4 mg via ORAL
  Filled 2024-06-12: qty 1

## 2024-06-12 NOTE — Progress Notes (Signed)
 " PROGRESS NOTE  Terry ZWAHLEN FMW:[id number] DOB: 1935-07-04 DOA: 06/04/2024 PCP: Shayne Anes, MD   LOS: 8 days   Brief Narrative / Interim history: 89 year old male with history of mechanical mitral valve on Coumadin  with goal INR 2.5-3.5, HTN, HLD, BPH who suffered a traumatic hematoma to the left leg followed by compartment syndrome status post multiple debridements, wound VAC placement, eventually discharged to an SNF.  He presented a day after discharge with weakness, found to have anemia with a hemoglobin in the 7 range.  Orthopedic surgery was consulted and conservative management has been pursued.  He has continued to slowly ooze from the wound and required a total of 8 units of packed red blood cells by the time I picked up the patient's care on 1/31  Subjective / 24h Interval events: He is doing well this morning, reports that he slept really well overnight, denies any chest pain, denies any leg pain.  He has no shortness of breath, no abdominal discomfort, no nausea or vomiting  Assesement and Plan: Principal problem Left lower extremity wound -he had trauma to the left leg, developed hematoma and compartment syndrome status post multiple debridements and eventually underwent skin grafting.  Orthopedic surgery consulted, he is status post TXA 1/27 [facility] course complicated by persistent bleeding in the setting of having to be on anticoagulation for his mechanical mitral valve - Status post 8 units of packed red blood cells today, last transfusion 1/30 - Continue to monitor hemoglobin.  Query need for amputation if this continues to be a problem, defer to orthopedics  Active problems Acute blood loss anemia-in the setting of #1, continue to monitor hemoglobin and transfuse as needed  Sinus tachycardia, leukocytosis -initially placed empirically on Zosyn  but source is not clear and antibiotics have been discontinued on 1/30.  He has been having soft BPs at times but this is  probably due to his problems #1 and #2. - He is afebrile, white count has normalized, Zosyn  discontinued on 1/30.  Continue to monitor off antibiotics  Mechanical mitral valve, chronic anticoagulation -continue Coumadin  per pharmacy.  INR 2.4 today.  Hemoglobin overall stable now at 9.1  Essential hypertension-continue metoprolol   Hyperlipidemia-continue statin  Scheduled Meds:  acetaminophen   1,000 mg Oral Q8H   dorzolamide -timolol   1 drop Both Eyes BID   feeding supplement  237 mL Oral TID BM   lidocaine   1 patch Transdermal Q24H   metoprolol  tartrate  25 mg Oral BID   multivitamin with minerals  1 tablet Oral Daily   pantoprazole   40 mg Oral Daily   rosuvastatin   20 mg Oral QHS   Tafluprost  (PF)  1 drop Both Eyes QHS   warfarin  4 mg Oral ONCE-1600   Warfarin - Pharmacist Dosing Inpatient   Does not apply q1600   Continuous Infusions: PRN Meds:.ondansetron , mouth rinse, oxyCODONE , polyethylene glycol  Current Outpatient Medications  Medication Instructions   acetaminophen  (TYLENOL ) 1,000 mg, Oral, Every 8 hours   barrier cream (NON-SPECIFIED) CREA 1 Application, Topical, 3 times daily   dorzolamide -timolol  (COSOPT ) 2-0.5 % ophthalmic solution 1 drop, 2 times daily   enoxaparin  (LOVENOX ) 85 mg, Subcutaneous, Every 12 hours   enoxaparin  (LOVENOX ) 80 mg, Subcutaneous, 2 times daily   lidocaine  (LIDODERM ) 5 % 1 patch, Transdermal, Every 24 hours, Remove & Discard patch within 12 hours or as directed by MD   metoprolol  succinate (TOPROL -XL) 50 mg, Oral, Daily   multivitamin (THERAGRAN) per tablet 1 tablet, Oral, Daily with breakfast  omeprazole (PRILOSEC) 20 mg, Oral, See admin instructions, Take 20 mg by mouth every other morning at 6:30 AM- 30 minutes prior to breakfast   Ostomy Supplies (SKIN PREP SPRAY) MISC 1 application , Topical, See admin instructions, Apply to bilateral heels in the morning and evening   oxyCODONE  (OXY IR/ROXICODONE ) 5 mg, Oral, Every 6 hours PRN    polyethylene glycol (MIRALAX ) 17 g, Oral, Daily   rosuvastatin  (CRESTOR ) 20 mg, Oral, Daily at bedtime   Tafluprost , PF, 0.0015 % SOLN 1 drop, Both Eyes, Daily at bedtime   warfarin (COUMADIN ) 5 mg, Oral, Daily-1600    Diet Orders (From admission, onward)     Start     Ordered   06/04/24 1642  Diet regular Room service appropriate? Yes; Fluid consistency: Thin  Diet effective now       Question Answer Comment  Room service appropriate? Yes   Fluid consistency: Thin      06/04/24 1641            DVT prophylaxis:  warfarin (COUMADIN ) tablet 4 mg   Lab Results  Component Value Date   PLT 227 06/12/2024      Code Status: Full Code  Family Communication: No family at bedside  Status is: Inpatient Remains inpatient appropriate because: Severity of illness   Level of care: Telemetry  Consultants:  Orthopedic surgery  Objective: Vitals:   06/11/24 1329 06/11/24 1524 06/11/24 2114 06/12/24 0620  BP: (!) 90/51 (!) 97/51 (!) 121/55 (!) 133/56  Pulse: 80 87 93 97  Resp: 18 20 18 18   Temp: 97.9 F (36.6 C) 97.6 F (36.4 C) 98.6 F (37 C) 97.6 F (36.4 C)  TempSrc: Oral Oral Oral Oral  SpO2: 99% 99% 98% 96%  Weight:      Height:        Intake/Output Summary (Last 24 hours) at 06/12/2024 1002 Last data filed at 06/12/2024 1002 Gross per 24 hour  Intake 1012 ml  Output 950 ml  Net 62 ml   Wt Readings from Last 3 Encounters:  06/04/24 85 kg  05/28/24 85 kg  08/25/23 77.2 kg    Examination:  Constitutional: NAD Eyes: no scleral icterus ENMT: Mucous membranes are moist.  Neck: normal, supple Respiratory: clear to auscultation bilaterally, no wheezing, no crackles. Normal respiratory effort.  Cardiovascular: Regular rate and rhythm, no murmurs / rubs / gallops. No LE edema.  Abdomen: non distended, no tenderness. Bowel sounds positive.  Musculoskeletal: no clubbing / cyanosis.   Data Reviewed: I have independently reviewed following labs and imaging  studies   CBC Recent Labs  Lab 06/08/24 0350 06/09/24 0346 06/09/24 2102 06/10/24 0533 06/10/24 1726 06/11/24 0352 06/11/24 1635 06/12/24 0824  WBC 25.3* 18.3*  --  14.2*  --  9.5  --  7.7  HGB 7.4* 6.1*   < > 7.4* 6.6* 7.4* 9.2* 9.1*  HCT 21.6* 18.7*   < > 23.2* 21.7* 22.7* 27.7* 29.0*  PLT 297 286  --  245  --  212  --  227  MCV 92.7 98.4  --  96.3  --  96.6  --  97.6  MCH 31.8 32.1  --  30.7  --  31.5  --  30.6  MCHC 34.3 32.6  --  31.9  --  32.6  --  31.4  RDW 15.0 18.5*  --  18.0*  --  16.4*  --  16.6*   < > = values in this interval not displayed.    Recent  Labs  Lab 06/07/24 0529 06/08/24 0350 06/09/24 0346 06/10/24 0533 06/11/24 0352 06/12/24 0327  NA 137  --  136 138 136  --   K 4.2  --  4.1 4.5 4.1  --   CL 105  --  103 105 103  --   CO2 23  --  26 26 27   --   GLUCOSE 114*  --  116* 117* 97  --   BUN 37*  --  32* 31* 26*  --   CREATININE 0.84  --  0.93 0.95 0.91  --   CALCIUM  8.2*  --  7.9* 7.7* 7.6*  --   INR 1.8* 1.4* 2.3* 2.6* 3.2* 2.4*    ------------------------------------------------------------------------------------------------------------------ No results for input(s): CHOL, HDL, LDLCALC, TRIG, CHOLHDL, LDLDIRECT in the last 72 hours.  No results found for: HGBA1C ------------------------------------------------------------------------------------------------------------------ No results for input(s): TSH, T4TOTAL, T3FREE, THYROIDAB in the last 72 hours.  Invalid input(s): FREET3  Cardiac Enzymes No results for input(s): CKMB, TROPONINI, MYOGLOBIN in the last 168 hours.  Invalid input(s): CK ------------------------------------------------------------------------------------------------------------------    Component Value Date/Time   BNP 165.0 (H) 12/13/2021 0603    CBG: No results for input(s): GLUCAP in the last 168 hours.  Recent Results (from the past 240 hours)  Culture, blood (Routine X 2)  w Reflex to ID Panel     Status: None (Preliminary result)   Collection Time: 06/08/24  8:41 AM   Specimen: BLOOD  Result Value Ref Range Status   Specimen Description   Final    BLOOD LEFT ANTECUBITAL Performed at [facility], [address] [place], [place] [address]    Special Requests   Final    BOTTLES DRAWN AEROBIC AND ANAEROBIC Blood Culture results may not be optimal due to an inadequate volume of blood received in culture bottles Performed at [facility], [address] [place], [place] [address]    Culture   Final    NO GROWTH 4 DAYS Performed at [facility], [address], [place], [place] [address]    Report Status PENDING  Incomplete  Culture, blood (Routine X 2) w Reflex to ID Panel     Status: None (Preliminary result)   Collection Time: 06/08/24  8:43 AM   Specimen: BLOOD  Result Value Ref Range Status   Specimen Description   Final    BLOOD LEFT ANTECUBITAL Performed at [facility], [address], [place], [place] [address]    Special Requests   Final    BOTTLES DRAWN AEROBIC AND ANAEROBIC Blood Culture results may not be optimal due to an inadequate volume of blood received in culture bottles Performed at [facility], [address] [place], [place] [address]    Culture   Final    NO GROWTH 4 DAYS Performed at [facility], [address] [place], [place] [address]    Report Status PENDING  Incomplete  Resp panel by RT-PCR (RSV, Flu A&B, Covid) Anterior Nasal Swab     Status: None   Collection Time: 06/08/24  4:09 PM   Specimen: Anterior Nasal Swab  Result Value Ref Range Status   SARS Coronavirus 2 by RT PCR NEGATIVE NEGATIVE Final    Comment: (NOTE) SARS-CoV-2 target nucleic acids are NOT DETECTED.  The SARS-CoV-2 RNA is generally detectable in upper respiratory specimens during the acute phase of infection. The lowest concentration of SARS-CoV-2 viral  copies this assay can detect is 138  copies/mL. A negative result does not preclude SARS-Cov-2 infection and should not be used as the sole basis for treatment or other patient management decisions. A negative result may occur with  improper specimen collection/handling, submission of specimen other than nasopharyngeal swab, presence of viral mutation(s) within the areas targeted by this assay, and inadequate number of viral copies(<138 copies/mL). A negative result must be combined with clinical observations, patient history, and epidemiological information. The expected result is Negative.  Fact Sheet for Patients:  bloggercourse.com  Fact Sheet for Healthcare Providers:  seriousbroker.it  This test is no t yet approved or cleared by the [place]  FDA and  has been authorized for detection and/or diagnosis of SARS-CoV-2 by FDA under an Emergency Use Authorization (EUA). This EUA will remain  in effect (meaning this test can be used) for the duration of the COVID-19 declaration under Section 564(b)(1) of the Act, 21 U.S.C.section 360bbb-3(b)(1), unless the authorization is terminated  or revoked sooner.       Influenza A by PCR NEGATIVE NEGATIVE Final   Influenza B by PCR NEGATIVE NEGATIVE Final    Comment: (NOTE) The Xpert Xpress SARS-CoV-2/FLU/RSV plus assay is intended as an aid in the diagnosis of influenza from Nasopharyngeal swab specimens and should not be used as a sole basis for treatment. Nasal washings and aspirates are unacceptable for Xpert Xpress SARS-CoV-2/FLU/RSV testing.  Fact Sheet for Patients: bloggercourse.com  Fact Sheet for Healthcare Providers: seriousbroker.it  This test is not yet approved or cleared by the [place]  FDA and has been authorized for detection and/or diagnosis of SARS-CoV-2 by FDA under an Emergency Use Authorization (EUA). This  EUA will remain in effect (meaning this test can be used) for the duration of the COVID-19 declaration under Section 564(b)(1) of the Act, 21 U.S.C. section 360bbb-3(b)(1), unless the authorization is terminated or revoked.     Resp Syncytial Virus by PCR NEGATIVE NEGATIVE Final    Comment: (NOTE) Fact Sheet for Patients: bloggercourse.com  Fact Sheet for Healthcare Providers: seriousbroker.it  This test is not yet approved or cleared by the [place]  FDA and has been authorized for detection and/or diagnosis of SARS-CoV-2 by FDA under an Emergency Use Authorization (EUA). This EUA will remain in effect (meaning this test can be used) for the duration of the COVID-19 declaration under Section 564(b)(1) of the Act, 21 U.S.C. section 360bbb-3(b)(1), unless the authorization is terminated or revoked.  Performed at [facility], [address], [place], [place] [address]      Radiology Studies: No results found.   Nilda Fendt, MD, PhD [facility]  Between 7 am - 7 pm I am available, please contact me via Amion (for emergencies) or Securechat (non urgent messages)  Between 7 pm - 7 am I am not available, please contact night coverage MD/APP via Amion  "

## 2024-06-12 NOTE — Progress Notes (Signed)
 PHARMACY - ANTICOAGULATION CONSULT NOTE  Pharmacy Consult for Warfarin Indication: mechanical valve  Allergies[1]  Patient Measurements: Height: 5' 11 (180.3 cm) Weight: 85 kg (187 lb 6.3 oz) IBW/kg (Calculated) : 75.3 HEPARIN  DW (KG): 85  Vital Signs: Temp: 97.6 F (36.4 C) (01/31 0620) Temp Source: Oral (01/31 0620) BP: 133/56 (01/31 0620) Pulse Rate: 97 (01/31 0620)  Labs: Recent Labs    06/10/24 0533 06/10/24 1726 06/11/24 0352 06/11/24 1635 06/12/24 0327 06/12/24 0824  HGB 7.4*   < > 7.4* 9.2*  --  9.1*  HCT 23.2*   < > 22.7* 27.7*  --  29.0*  PLT 245  --  212  --   --  227  LABPROT 29.1*  --  34.1*  --  27.2*  --   INR 2.6*  --  3.2*  --  2.4*  --   HEPARINUNFRC 0.70  --  0.71*  --   --   --   CREATININE 0.95  --  0.91  --   --   --    < > = values in this interval not displayed.    Estimated Creatinine Clearance: 62.1 mL/min (by C-G formula based on SCr of 0.91 mg/dL).   Medical History: Past Medical History:  Diagnosis Date   Benign prostatic hypertrophy    Dyslipidemia    Hyperlipidemia    Long-term (current) use of anticoagulants    Mitral valve prolapse    Osteoporosis    Ventricular tachycardia (HCC)     Assessment: AC/Heme: MVR on warfarin PTA - INR 3.3 (1/23), - Previous home regimen: 2.5 mg MWF and 5 mg TThSS (weekly dose = 27.5) but plan when discharged 1/22 was to continue warfarin dose at higher end of dosing - 5mg  daily until follow up with [facility] clinic or at [facility] and full dose Lovenox  until INR > 2.5 - Doses held 1/22-1/25  -1/26: INR 1.8. Start IV heparin  + warfarin (LD 1/22),  AM RN notes continued bleeding. Dr. Harden seeing patient. (dressing on L leg from hematoma) - 1/30 MD stopped Zosyn  and heparin . Transfuse - INR down to 2.4 Hgb 9.1   Goal of Therapy:  INR 2.5-3.5 Monitor platelets by anticoagulation protocol: Yes   Plan:  Coumadin  4mg  po x 1 today Daily INR   Sameria Morss Karoline Marina, PharmD, BCPS Clinical Staff  Pharmacist Marina, Victor Wenzlick Stillinger 06/12/2024,9:17 AM      [1]  Allergies Allergen Reactions   Alendronate Other (See Comments)    GERD and allergic, per [facility]   Aspirin Other (See Comments)    Too much bruising when added to Coumadin ; listed as allergic, per MAR   Cipro [Ciprofloxacin Hcl] Other (See Comments)    Heartburn and allergic, per [facility]   Sulfa Drugs Cross Reactors Hives and Other (See Comments)    Allergic, per [facility]

## 2024-06-13 DIAGNOSIS — D62 Acute posthemorrhagic anemia: Secondary | ICD-10-CM | POA: Diagnosis not present

## 2024-06-13 LAB — COMPREHENSIVE METABOLIC PANEL WITH GFR
ALT: 32 U/L (ref 0–44)
AST: 32 U/L (ref 15–41)
Albumin: 2.7 g/dL — ABNORMAL LOW (ref 3.5–5.0)
Alkaline Phosphatase: 66 U/L (ref 38–126)
Anion gap: 6 (ref 5–15)
BUN: 11 mg/dL (ref 8–23)
CO2: 25 mmol/L (ref 22–32)
Calcium: 8.1 mg/dL — ABNORMAL LOW (ref 8.9–10.3)
Chloride: 107 mmol/L (ref 98–111)
Creatinine, Ser: 0.77 mg/dL (ref 0.61–1.24)
GFR, Estimated: >60 mL/min
Glucose, Bld: 92 mg/dL (ref 70–99)
Potassium: 4.2 mmol/L (ref 3.5–5.1)
Sodium: 138 mmol/L (ref 135–145)
Total Bilirubin: 0.5 mg/dL (ref 0.0–1.2)
Total Protein: 4.7 g/dL — ABNORMAL LOW (ref 6.5–8.1)

## 2024-06-13 LAB — MAGNESIUM: Magnesium: 2.2 mg/dL (ref 1.7–2.4)

## 2024-06-13 LAB — CBC
HCT: 28.1 % — ABNORMAL LOW (ref 39.0–52.0)
Hemoglobin: 9.1 g/dL — ABNORMAL LOW (ref 13.0–17.0)
MCH: 30.7 pg (ref 26.0–34.0)
MCHC: 32.4 g/dL (ref 30.0–36.0)
MCV: 94.9 fL (ref 80.0–100.0)
Platelets: 244 10*3/uL (ref 150–400)
RBC: 2.96 MIL/uL — ABNORMAL LOW (ref 4.22–5.81)
RDW: 16 % — ABNORMAL HIGH (ref 11.5–15.5)
WBC: 7.6 10*3/uL (ref 4.0–10.5)
nRBC: 0 % (ref 0.0–0.2)

## 2024-06-13 LAB — CULTURE, BLOOD (ROUTINE X 2)
Culture: NO GROWTH
Culture: NO GROWTH

## 2024-06-13 LAB — PROTIME-INR
INR: 2 — ABNORMAL HIGH (ref 0.8–1.2)
Prothrombin Time: 23.7 s — ABNORMAL HIGH (ref 11.4–15.2)

## 2024-06-13 MED ORDER — WARFARIN SODIUM 4 MG PO TABS
4.0000 mg | ORAL_TABLET | ORAL | Status: AC
  Administered 2024-06-13: 4 mg via ORAL
  Filled 2024-06-13: qty 1

## 2024-06-13 MED ORDER — WARFARIN SODIUM 4 MG PO TABS
4.0000 mg | ORAL_TABLET | Freq: Once | ORAL | Status: DC
  Filled 2024-06-13: qty 1

## 2024-06-13 NOTE — Progress Notes (Signed)
 " PROGRESS NOTE  Terry [name] FMW:[id number] DOB: 08/07/1935 DOA: 06/04/2024 PCP: Shayne Anes, MD   LOS: 9 days   Brief Narrative / Interim history: 89 year old male with history of mechanical mitral valve on Coumadin  with goal INR 2.5-3.5, HTN, HLD, BPH who suffered a traumatic hematoma to the left leg followed by compartment syndrome status post multiple debridements, wound VAC placement, eventually discharged to an SNF.  He presented a day after discharge with weakness, found to have anemia with a hemoglobin in the 7 range.  Orthopedic surgery was consulted and conservative management has been pursued.  He has continued to slowly ooze from the wound and required a total of 8 units of packed red blood cells by the time I picked up the patient's care on 1/31  Subjective / 24h Interval events: The well today, denies any chest pain, no shortness of breath.  No abdominal pain, no nausea or vomiting  Assesement and Plan: Principal problem Left lower extremity wound -he had trauma to the left leg, developed hematoma and compartment syndrome status post multiple debridements and eventually underwent skin grafting.  Orthopedic surgery consulted, he is status post TXA 1/27 [facility] course complicated by persistent bleeding in the setting of having to be on anticoagulation for his mechanical mitral valve - Status post 8 units of packed red blood cells today, last transfusion 1/30 - Continue to monitor hemoglobin.  Query need for amputation if this continues to be a problem, defer to orthopedics.  Hemoglobin this morning is stable, 9.1, identical to yesterday.  Active problems Acute blood loss anemia-in the setting of #1, continue to monitor hemoglobin and transfuse as needed  Sinus tachycardia, leukocytosis -initially placed empirically on Zosyn  but source is not clear and antibiotics have been discontinued on 1/30.  He has been having soft BPs at times but this is probably due to his problems #1  and #2. - He is afebrile, white count has normalized, Zosyn  discontinued on 1/30.  Monitor off antibiotics  Mechanical mitral valve, chronic anticoagulation -continue Coumadin  per pharmacy.  INR 2.0 today  Essential hypertension-continue metoprolol , blood pressure is stable  Hyperlipidemia-continue statin  Scheduled Meds:  acetaminophen   1,000 mg Oral Q8H   dorzolamide -timolol   1 drop Both Eyes BID   feeding supplement  237 mL Oral TID BM   lidocaine   1 patch Transdermal Q24H   metoprolol  tartrate  25 mg Oral BID   multivitamin with minerals  1 tablet Oral Daily   pantoprazole   40 mg Oral Daily   rosuvastatin   20 mg Oral QHS   Tafluprost  (PF)  1 drop Both Eyes QHS   Warfarin - Pharmacist Dosing Inpatient   Does not apply q1600   Continuous Infusions: PRN Meds:.ondansetron , mouth rinse, oxyCODONE , polyethylene glycol  Current Outpatient Medications  Medication Instructions   acetaminophen  (TYLENOL ) 1,000 mg, Oral, Every 8 hours   barrier cream (NON-SPECIFIED) CREA 1 Application, Topical, 3 times daily   dorzolamide -timolol  (COSOPT ) 2-0.5 % ophthalmic solution 1 drop, 2 times daily   enoxaparin  (LOVENOX ) 85 mg, Subcutaneous, Every 12 hours   enoxaparin  (LOVENOX ) 80 mg, Subcutaneous, 2 times daily   lidocaine  (LIDODERM ) 5 % 1 patch, Transdermal, Every 24 hours, Remove & Discard patch within 12 hours or as directed by MD   metoprolol  succinate (TOPROL -XL) 50 mg, Oral, Daily   multivitamin (THERAGRAN) per tablet 1 tablet, Oral, Daily with breakfast   omeprazole (PRILOSEC) 20 mg, Oral, See admin instructions, Take 20 mg by mouth every other morning at 6:30  AM- 30 minutes prior to breakfast   Ostomy Supplies (SKIN PREP SPRAY) MISC 1 application , Topical, See admin instructions, Apply to bilateral heels in the morning and evening   oxyCODONE  (OXY IR/ROXICODONE ) 5 mg, Oral, Every 6 hours PRN   polyethylene glycol (MIRALAX ) 17 g, Oral, Daily   rosuvastatin  (CRESTOR ) 20 mg, Oral, Daily  at bedtime   Tafluprost , PF, 0.0015 % SOLN 1 drop, Both Eyes, Daily at bedtime   warfarin (COUMADIN ) 5 mg, Oral, Daily-1600    Diet Orders (From admission, onward)     Start     Ordered   06/04/24 1642  Diet regular Room service appropriate? Yes; Fluid consistency: Thin  Diet effective now       Question Answer Comment  Room service appropriate? Yes   Fluid consistency: Thin      06/04/24 1641            DVT prophylaxis:    Lab Results  Component Value Date   PLT 244 06/13/2024      Code Status: Full Code  Family Communication: No family at bedside  Status is: Inpatient Remains inpatient appropriate because: Severity of illness   Level of care: Telemetry  Consultants:  Orthopedic surgery  Objective: Vitals:   06/12/24 0620 06/12/24 1258 06/12/24 2059 06/13/24 0556  BP: (!) 133/56 (!) 108/57 117/61 (!) 109/54  Pulse: 97 94 (!) 102 95  Resp: 18 16 16 15   Temp: 97.6 F (36.4 C) (!) 97.4 F (36.3 C) (!) 97.5 F (36.4 C) 98.8 F (37.1 C)  TempSrc: Oral Oral Oral Oral  SpO2: 96% 99% 100% 99%  Weight:      Height:        Intake/Output Summary (Last 24 hours) at 06/13/2024 1118 Last data filed at 06/13/2024 1000 Gross per 24 hour  Intake 580 ml  Output 1950 ml  Net -1370 ml   Wt Readings from Last 3 Encounters:  06/04/24 85 kg  05/28/24 85 kg  08/25/23 77.2 kg    Examination:  Constitutional: NAD Eyes: lids and conjunctivae normal, no scleral icterus ENMT: mmm Neck: normal, supple Respiratory: clear to auscultation bilaterally, no wheezing, no crackles. Normal respiratory effort.  Cardiovascular: Regular rate and rhythm, no murmurs / rubs / gallops. No LE edema. Abdomen: soft, no distention, no tenderness. Bowel sounds positive.   Data Reviewed: I have independently reviewed following labs and imaging studies   CBC Recent Labs  Lab 06/09/24 0346 06/09/24 2102 06/10/24 0533 06/10/24 1726 06/11/24 0352 06/11/24 1635 06/12/24 0824  06/13/24 0345  WBC 18.3*  --  14.2*  --  9.5  --  7.7 7.6  HGB 6.1*   < > 7.4* 6.6* 7.4* 9.2* 9.1* 9.1*  HCT 18.7*   < > 23.2* 21.7* 22.7* 27.7* 29.0* 28.1*  PLT 286  --  245  --  212  --  227 244  MCV 98.4  --  96.3  --  96.6  --  97.6 94.9  MCH 32.1  --  30.7  --  31.5  --  30.6 30.7  MCHC 32.6  --  31.9  --  32.6  --  31.4 32.4  RDW 18.5*  --  18.0*  --  16.4*  --  16.6* 16.0*   < > = values in this interval not displayed.    Recent Labs  Lab 06/07/24 0529 06/08/24 0350 06/09/24 0346 06/10/24 0533 06/11/24 0352 06/12/24 0327 06/13/24 0345  NA 137  --  136 138 136  --  138  K 4.2  --  4.1 4.5 4.1  --  4.2  CL 105  --  103 105 103  --  107  CO2 23  --  26 26 27   --  25  GLUCOSE 114*  --  116* 117* 97  --  92  BUN 37*  --  32* 31* 26*  --  11  CREATININE 0.84  --  0.93 0.95 0.91  --  0.77  CALCIUM  8.2*  --  7.9* 7.7* 7.6*  --  8.1*  AST  --   --   --   --   --   --  32  ALT  --   --   --   --   --   --  32  ALKPHOS  --   --   --   --   --   --  66  BILITOT  --   --   --   --   --   --  0.5  ALBUMIN  --   --   --   --   --   --  2.7*  MG  --   --   --   --   --   --  2.2  INR 1.8*   < > 2.3* 2.6* 3.2* 2.4* 2.0*   < > = values in this interval not displayed.    ------------------------------------------------------------------------------------------------------------------ No results for input(s): CHOL, HDL, LDLCALC, TRIG, CHOLHDL, LDLDIRECT in the last 72 hours.  No results found for: HGBA1C ------------------------------------------------------------------------------------------------------------------ No results for input(s): TSH, T4TOTAL, T3FREE, THYROIDAB in the last 72 hours.  Invalid input(s): FREET3  Cardiac Enzymes No results for input(s): CKMB, TROPONINI, MYOGLOBIN in the last 168 hours.  Invalid input(s):  CK ------------------------------------------------------------------------------------------------------------------    Component Value Date/Time   BNP 165.0 (H) 12/13/2021 0603    CBG: No results for input(s): GLUCAP in the last 168 hours.  Recent Results (from the past 240 hours)  Culture, blood (Routine X 2) w Reflex to ID Panel     Status: None   Collection Time: 06/08/24  8:41 AM   Specimen: BLOOD  Result Value Ref Range Status   Specimen Description   Final    BLOOD LEFT ANTECUBITAL Performed at [facility], [address], [place], [place] [address]    Special Requests   Final    BOTTLES DRAWN AEROBIC AND ANAEROBIC Blood Culture results may not be optimal due to an inadequate volume of blood received in culture bottles Performed at [facility], [address], [place], [place] [address]    Culture   Final    NO GROWTH 5 DAYS Performed at [facility], [address] [place], [place] [address]    Report Status 06/13/2024 FINAL  Final  Culture, blood (Routine X 2) w Reflex to ID Panel     Status: None   Collection Time: 06/08/24  8:43 AM   Specimen: BLOOD  Result Value Ref Range Status   Specimen Description   Final    BLOOD LEFT ANTECUBITAL Performed at [facility], [address], [place], [place] [address]    Special Requests   Final    BOTTLES DRAWN AEROBIC AND ANAEROBIC Blood Culture results may not be optimal due to an inadequate volume of blood received in culture bottles Performed at [facility], [address], [place], [place] [address]    Culture   Final  NO GROWTH 5 DAYS Performed at [facility], [address] [place], [place] [address]    Report Status 06/13/2024 FINAL  Final  Resp panel by RT-PCR (RSV, Flu A&B, Covid) Anterior Nasal Swab     Status: None   Collection Time: 06/08/24  4:09 PM   Specimen: Anterior Nasal Swab  Result Value Ref Range  Status   SARS Coronavirus 2 by RT PCR NEGATIVE NEGATIVE Final    Comment: (NOTE) SARS-CoV-2 target nucleic acids are NOT DETECTED.  The SARS-CoV-2 RNA is generally detectable in upper respiratory specimens during the acute phase of infection. The lowest concentration of SARS-CoV-2 viral copies this assay can detect is 138 copies/mL. A negative result does not preclude SARS-Cov-2 infection and should not be used as the sole basis for treatment or other patient management decisions. A negative result may occur with  improper specimen collection/handling, submission of specimen other than nasopharyngeal swab, presence of viral mutation(s) within the areas targeted by this assay, and inadequate number of viral copies(<138 copies/mL). A negative result must be combined with clinical observations, patient history, and epidemiological information. The expected result is Negative.  Fact Sheet for Patients:  bloggercourse.com  Fact Sheet for Healthcare Providers:  seriousbroker.it  This test is no t yet approved or cleared by the [place]  FDA and  has been authorized for detection and/or diagnosis of SARS-CoV-2 by FDA under an Emergency Use Authorization (EUA). This EUA will remain  in effect (meaning this test can be used) for the duration of the COVID-19 declaration under Section 564(b)(1) of the Act, 21 U.S.C.section 360bbb-3(b)(1), unless the authorization is terminated  or revoked sooner.       Influenza A by PCR NEGATIVE NEGATIVE Final   Influenza B by PCR NEGATIVE NEGATIVE Final    Comment: (NOTE) The Xpert Xpress SARS-CoV-2/FLU/RSV plus assay is intended as an aid in the diagnosis of influenza from Nasopharyngeal swab specimens and should not be used as a sole basis for treatment. Nasal washings and aspirates are unacceptable for Xpert Xpress SARS-CoV-2/FLU/RSV testing.  Fact Sheet for  Patients: bloggercourse.com  Fact Sheet for Healthcare Providers: seriousbroker.it  This test is not yet approved or cleared by the [place]  FDA and has been authorized for detection and/or diagnosis of SARS-CoV-2 by FDA under an Emergency Use Authorization (EUA). This EUA will remain in effect (meaning this test can be used) for the duration of the COVID-19 declaration under Section 564(b)(1) of the Act, 21 U.S.C. section 360bbb-3(b)(1), unless the authorization is terminated or revoked.     Resp Syncytial Virus by PCR NEGATIVE NEGATIVE Final    Comment: (NOTE) Fact Sheet for Patients: bloggercourse.com  Fact Sheet for Healthcare Providers: seriousbroker.it  This test is not yet approved or cleared by the [place]  FDA and has been authorized for detection and/or diagnosis of SARS-CoV-2 by FDA under an Emergency Use Authorization (EUA). This EUA will remain in effect (meaning this test can be used) for the duration of the COVID-19 declaration under Section 564(b)(1) of the Act, 21 U.S.C. section 360bbb-3(b)(1), unless the authorization is terminated or revoked.  Performed at [facility], [address] [place], [place] [address]      Radiology Studies: No results found.   Nilda Fendt, MD, PhD [facility]  Between 7 am - 7 pm I am available, please contact me via Amion (for emergencies) or Securechat (non urgent messages)  Between 7 pm - 7 am I am not available, please contact night  coverage MD/APP via Amion  "

## 2024-06-13 NOTE — Plan of Care (Signed)
   Problem: Coping: Goal: Level of anxiety will decrease Outcome: Progressing   Problem: Pain Managment: Goal: General experience of comfort will improve and/or be controlled Outcome: Progressing   Problem: Safety: Goal: Ability to remain free from injury will improve Outcome: Progressing

## 2024-06-13 NOTE — Plan of Care (Signed)
   Problem: Coping: Goal: Level of anxiety will decrease Outcome: Progressing   Problem: Pain Managment: Goal: General experience of comfort will improve and/or be controlled Outcome: Progressing

## 2024-06-13 NOTE — Progress Notes (Signed)
 PHARMACY - ANTICOAGULATION CONSULT NOTE  Pharmacy Consult for Warfarin Indication: mechanical valve  Allergies[1]  Patient Measurements: Height: 5' 11 (180.3 cm) Weight: 85 kg (187 lb 6.3 oz) IBW/kg (Calculated) : 75.3 HEPARIN  DW (KG): 85  Vital Signs: Temp: 98.8 F (37.1 C) (02/01 0556) Temp Source: Oral (02/01 0556) BP: 109/54 (02/01 0556) Pulse Rate: 95 (02/01 0556)  Labs: Recent Labs    06/11/24 0352 06/11/24 1635 06/12/24 0327 06/12/24 0824 06/13/24 0345  HGB 7.4* 9.2*  --  9.1* 9.1*  HCT 22.7* 27.7*  --  29.0* 28.1*  PLT 212  --   --  227 244  LABPROT 34.1*  --  27.2*  --  23.7*  INR 3.2*  --  2.4*  --  2.0*  HEPARINUNFRC 0.71*  --   --   --   --   CREATININE 0.91  --   --   --  0.77    Estimated Creatinine Clearance: 70.6 mL/min (by C-G formula based on SCr of 0.77 mg/dL).   Medical History: Past Medical History:  Diagnosis Date   Benign prostatic hypertrophy    Dyslipidemia    Hyperlipidemia    Long-term (current) use of anticoagulants    Mitral valve prolapse    Osteoporosis    Ventricular tachycardia (HCC)     Assessment: AC/Heme: MVR (goal 2.5-3.5) on warfarin PTA - INR 3.3 (1/23), - Previous home regimen: 2.5 mg MWF and 5 mg TThSS (weekly dose = 27.5) but plan when discharged 1/22 was to continue warfarin dose at higher end of dosing - 5mg  daily until follow up with [facility] clinic or at [facility] and full dose Lovenox  until INR > 2.5 - Doses held 1/22-1/25  -1/26: INR 1.8. Start IV heparin  + warfarin (LD 1/22),  AM RN notes continued bleeding. (dressing on L leg from hematoma) - 1/27: TXA - 1/30 MD stopped heparin . INR 3.2 Transfused - 2/1 INR down to 2 (from low doses 1/28-1/30). Hgb 9.1 stable   Goal of Therapy:  INR 2.5-3.5 Monitor platelets by anticoagulation protocol: Yes   Plan:  Repeat Coumadin  4mg  po x 1 now Daily INR May need to bridge if INR drops <2  Marleah Beever Karoline Marina, PharmD, BCPS Clinical Staff Pharmacist Marina,  Dave Mergen Stillinger 06/13/2024,9:23 AM       [1]  Allergies Allergen Reactions   Alendronate Other (See Comments)    GERD and allergic, per [facility]   Aspirin Other (See Comments)    Too much bruising when added to Coumadin ; listed as allergic, per MAR   Cipro [Ciprofloxacin Hcl] Other (See Comments)    Heartburn and allergic, per [facility]   Sulfa Drugs Cross Reactors Hives and Other (See Comments)    Allergic, per [facility]

## 2024-06-14 DIAGNOSIS — D62 Acute posthemorrhagic anemia: Secondary | ICD-10-CM | POA: Diagnosis not present

## 2024-06-14 LAB — TYPE AND SCREEN
ABO/RH(D): A POS
Antibody Screen: NEGATIVE
Unit division: 0

## 2024-06-14 LAB — CBC
HCT: 29.8 % — ABNORMAL LOW (ref 39.0–52.0)
Hemoglobin: 9.2 g/dL — ABNORMAL LOW (ref 13.0–17.0)
MCH: 30.4 pg (ref 26.0–34.0)
MCHC: 30.9 g/dL (ref 30.0–36.0)
MCV: 98.3 fL (ref 80.0–100.0)
Platelets: 286 10*3/uL (ref 150–400)
RBC: 3.03 MIL/uL — ABNORMAL LOW (ref 4.22–5.81)
RDW: 15.9 % — ABNORMAL HIGH (ref 11.5–15.5)
WBC: 8.3 10*3/uL (ref 4.0–10.5)
nRBC: 0 % (ref 0.0–0.2)

## 2024-06-14 LAB — BPAM RBC
Blood Product Expiration Date: 202602272359
ISSUE DATE / TIME: 202601301202
Unit Type and Rh: 6200

## 2024-06-14 LAB — PROTIME-INR
INR: 2.3 — ABNORMAL HIGH (ref 0.8–1.2)
Prothrombin Time: 26 s — ABNORMAL HIGH (ref 11.4–15.2)

## 2024-06-14 MED ORDER — WARFARIN SODIUM 3 MG PO TABS
3.0000 mg | ORAL_TABLET | Freq: Once | ORAL | Status: AC
  Administered 2024-06-14: 3 mg via ORAL
  Filled 2024-06-14: qty 1

## 2024-06-14 NOTE — Progress Notes (Signed)
 Received call from central telemetry that patient had an 8 beat run of non-sustained V-tach. Checked on patient, he is currently sleeping soundly, in no acute distress, respirations easy and unlabored, notified on-call provider A. Alto, NP via secure chat

## 2024-06-14 NOTE — Progress Notes (Addendum)
 PHARMACY - ANTICOAGULATION CONSULT NOTE  Pharmacy Consult for warfarin Indication: hx mechanical mitral valve  Allergies[1]  Patient Measurements: Height: 5' 11 (180.3 cm) Weight: 85 kg (187 lb 6.3 oz) IBW/kg (Calculated) : 75.3 HEPARIN  DW (KG): 85  Vital Signs: Temp: 97.8 F (36.6 C) (02/02 0444) Temp Source: Oral (02/02 0444) BP: 111/57 (02/02 0444) Pulse Rate: 87 (02/02 0444)  Labs: Recent Labs    06/12/24 0327 06/12/24 0824 06/13/24 0345 06/14/24 0320  HGB  --  9.1* 9.1* 9.2*  HCT  --  29.0* 28.1* 29.8*  PLT  --  227 244 286  LABPROT 27.2*  --  23.7* 26.0*  INR 2.4*  --  2.0* 2.3*  CREATININE  --   --  0.77  --     Estimated Creatinine Clearance: 70.6 mL/min (by C-G formula based on SCr of 0.77 mg/dL).   Medical History: Past Medical History:  Diagnosis Date   Benign prostatic hypertrophy    Dyslipidemia    Hyperlipidemia    Long-term (current) use of anticoagulants    Mitral valve prolapse    Osteoporosis    Ventricular tachycardia (HCC)     Medications:  -  PTA warfarin regimen per [facility] clinic note on 12/29:  5mg  daily except 2.5 mg on MWF for INR goal 2.5-3.5 - He was discharged from [facility] on 1/22 with pharmacist recom. to do 5mg  daily  until follow-up with [facility] clinic or at SNF within next 2-3 days  and to treat with LMWH 85 mg q12h until INR >2.5 - Patient came back to the ED on 1/23 from [place]  Assessment: Patient is an 89 yo M with hx mechanical mitral valve on warfarin PTA who was hospitalized at [facility] from 05/20/24 to 06/03/24 for left lower extremity hematoma. During this hospitalization, he underwent debridement of hematoma with wound VAC on 05/26/24 and additional debridement on 05/28/24. He was discharged from [facility] to SNF on 06/03/24 with warfarin and LMWH bridge.  He presented back to the ED on 06/04/24 due to bleeding at wound site and generalized weakness.  Warfarin resumed on admission and bridged with heparin  drip from 06/06/24 to 06/11/24.  Significant  events: - 1/23: 2 units PRBC - 1/24: 1 units PRBC - 1/26: 1 units PRBC - 1/27: TXA 2000 mg topical x1 - 1/28: 2 units PRBC - 1/30: 2 units PRBC   Today, 06/14/2024: - INR is sub-therapeutic but increased from 2 to 2.3  - hgb and plts stable - no significant drug-drug interactions - pt's RN reported that outer dressing at wound site remained dry this morning  Goal of Therapy:  INR goal 2.5-3.5 Monitor platelets by anticoagulation protocol: Yes   Plan:  - warfarin 3 mg PO x1 today  - daily INR - monitor for severity of  bleeding at wound site   Yuritzi Kamp P 06/14/2024,8:28 AM      [1]  Allergies Allergen Reactions   Alendronate Other (See Comments)    GERD and allergic, per [facility]   Aspirin Other (See Comments)    Too much bruising when added to Coumadin ; listed as allergic, per MAR   Cipro [Ciprofloxacin Hcl] Other (See Comments)    Heartburn and allergic, per [facility]   Sulfa Drugs Cross Reactors Hives and Other (See Comments)    Allergic, per [facility]

## 2024-06-14 NOTE — Plan of Care (Signed)
  Problem: Clinical Measurements: Goal: Ability to maintain clinical measurements within normal limits will improve Outcome: Progressing Goal: Will remain free from infection Outcome: Progressing Goal: Diagnostic test results will improve Outcome: Progressing Goal: Respiratory complications will improve Outcome: Progressing Goal: Cardiovascular complication will be avoided Outcome: Progressing   Problem: Activity: Goal: Risk for activity intolerance will decrease Outcome: Progressing   Problem: Coping: Goal: Level of anxiety will decrease Outcome: Progressing   Problem: Safety: Goal: Ability to remain free from injury will improve Outcome: Progressing   Problem: Skin Integrity: Goal: Risk for impaired skin integrity will decrease Outcome: Progressing

## 2024-06-14 NOTE — Progress Notes (Signed)
 " PROGRESS NOTE  Terry [name] FMW:[id number] DOB: Feb 07, 1936 DOA: 06/04/2024 PCP: Shayne Anes, MD   LOS: 10 days   Brief Narrative / Interim history: 89 year old male with history of mechanical mitral valve on Coumadin  with goal INR 2.5-3.5, HTN, HLD, BPH who suffered a traumatic hematoma to the left leg followed by compartment syndrome status post multiple debridements, wound VAC placement, eventually discharged to an SNF.  He presented a day after discharge with weakness, found to have anemia with a hemoglobin in the 7 range.  Orthopedic surgery was consulted and conservative management has been pursued.  He has continued to slowly ooze from the wound and required a total of 8 units of packed red blood cells by the time I picked up the patient's care on 1/31  Subjective / 24h Interval events: He is doing well today, feels good.  Denies any significant pain to his leg  Assesement and Plan: Principal problem Left lower extremity wound -he had trauma to the left leg, developed hematoma and compartment syndrome status post multiple debridements and eventually underwent skin grafting.  Orthopedic surgery consulted, he is status post TXA 1/27 [facility] course complicated by persistent bleeding in the setting of having to be on anticoagulation for his mechanical mitral valve - Status post 8 units of packed red blood cells today, last transfusion 1/30 - Continue to monitor hemoglobin.  Hemoglobin over the last 3 days has remained stable.  Will see and discussed with TOC about his return to SNF now that he has not required any further transfusions  Active problems Acute blood loss anemia-in the setting of #1, continue to monitor hemoglobin and transfuse as needed.  Hemoglobin is stable today  Sinus tachycardia, leukocytosis -initially placed empirically on Zosyn  but source is not clear and antibiotics have been discontinued on 1/30.  He has been having soft BPs at times but this is probably due  to his problems #1 and #2. - He is afebrile, white count has normalized, Zosyn  discontinued on 1/30.  He is stable off antibiotics  Mechanical mitral valve, chronic anticoagulation -continue Coumadin  per pharmacy.  INR 2.3  Essential hypertension-continue metoprolol , blood pressure is overall acceptable  Hyperlipidemia-continue statin  Scheduled Meds:  acetaminophen   1,000 mg Oral Q8H   dorzolamide -timolol   1 drop Both Eyes BID   feeding supplement  237 mL Oral TID BM   lidocaine   1 patch Transdermal Q24H   metoprolol  tartrate  25 mg Oral BID   multivitamin with minerals  1 tablet Oral Daily   pantoprazole   40 mg Oral Daily   rosuvastatin   20 mg Oral QHS   Tafluprost  (PF)  1 drop Both Eyes QHS   warfarin  3 mg Oral ONCE-1600   Warfarin - Pharmacist Dosing Inpatient   Does not apply q1600   Continuous Infusions: PRN Meds:.ondansetron , mouth rinse, oxyCODONE , polyethylene glycol  Current Outpatient Medications  Medication Instructions   acetaminophen  (TYLENOL ) 1,000 mg, Oral, Every 8 hours   barrier cream (NON-SPECIFIED) CREA 1 Application, Topical, 3 times daily   dorzolamide -timolol  (COSOPT ) 2-0.5 % ophthalmic solution 1 drop, 2 times daily   enoxaparin  (LOVENOX ) 85 mg, Subcutaneous, Every 12 hours   enoxaparin  (LOVENOX ) 80 mg, Subcutaneous, 2 times daily   lidocaine  (LIDODERM ) 5 % 1 patch, Transdermal, Every 24 hours, Remove & Discard patch within 12 hours or as directed by MD   metoprolol  succinate (TOPROL -XL) 50 mg, Oral, Daily   multivitamin (THERAGRAN) per tablet 1 tablet, Oral, Daily with breakfast   omeprazole (  PRILOSEC) 20 mg, Oral, See admin instructions, Take 20 mg by mouth every other morning at 6:30 AM- 30 minutes prior to breakfast   Ostomy Supplies (SKIN PREP SPRAY) MISC 1 application , Topical, See admin instructions, Apply to bilateral heels in the morning and evening   oxyCODONE  (OXY IR/ROXICODONE ) 5 mg, Oral, Every 6 hours PRN   polyethylene glycol (MIRALAX )  17 g, Oral, Daily   rosuvastatin  (CRESTOR ) 20 mg, Oral, Daily at bedtime   Tafluprost , PF, 0.0015 % SOLN 1 drop, Both Eyes, Daily at bedtime   warfarin (COUMADIN ) 5 mg, Oral, Daily-1600    Diet Orders (From admission, onward)     Start     Ordered   06/04/24 1642  Diet regular Room service appropriate? Yes; Fluid consistency: Thin  Diet effective now       Question Answer Comment  Room service appropriate? Yes   Fluid consistency: Thin      06/04/24 1641            DVT prophylaxis:  warfarin (COUMADIN ) tablet 3 mg   Lab Results  Component Value Date   PLT 286 06/14/2024      Code Status: Full Code  Family Communication: No family at bedside  Status is: Inpatient Remains inpatient appropriate because: Severity of illness   Level of care: Telemetry  Consultants:  Orthopedic surgery  Objective: Vitals:   06/13/24 0556 06/13/24 1257 06/13/24 2130 06/14/24 0444  BP: (!) 109/54 (!) 104/56 (!) 109/57 (!) 111/57  Pulse: 95 91 98 87  Resp: 15 16 18 18   Temp: 98.8 F (37.1 C)  98.4 F (36.9 C) 97.8 F (36.6 C)  TempSrc: Oral  Oral Oral  SpO2: 99% 100% 99% 98%  Weight:      Height:        Intake/Output Summary (Last 24 hours) at 06/14/2024 1026 Last data filed at 06/14/2024 0830 Gross per 24 hour  Intake 580 ml  Output 1190 ml  Net -610 ml   Wt Readings from Last 3 Encounters:  06/04/24 85 kg  05/28/24 85 kg  08/25/23 77.2 kg    Examination:  Constitutional: NAD Eyes: lids and conjunctivae normal, no scleral icterus ENMT: mmm Neck: normal, supple Respiratory: clear to auscultation bilaterally, no wheezing, no crackles. Normal respiratory effort.  Cardiovascular: Regular rate and rhythm, no murmurs / rubs / gallops. No LE edema. Abdomen: soft, no distention, no tenderness. Bowel sounds positive.   Data Reviewed: I have independently reviewed following labs and imaging studies   CBC Recent Labs  Lab 06/10/24 0533 06/10/24 1726 06/11/24 0352  06/11/24 1635 06/12/24 0824 06/13/24 0345 06/14/24 0320  WBC 14.2*  --  9.5  --  7.7 7.6 8.3  HGB 7.4*   < > 7.4* 9.2* 9.1* 9.1* 9.2*  HCT 23.2*   < > 22.7* 27.7* 29.0* 28.1* 29.8*  PLT 245  --  212  --  227 244 286  MCV 96.3  --  96.6  --  97.6 94.9 98.3  MCH 30.7  --  31.5  --  30.6 30.7 30.4  MCHC 31.9  --  32.6  --  31.4 32.4 30.9  RDW 18.0*  --  16.4*  --  16.6* 16.0* 15.9*   < > = values in this interval not displayed.    Recent Labs  Lab 06/09/24 0346 06/10/24 0533 06/11/24 0352 06/12/24 0327 06/13/24 0345 06/14/24 0320  NA 136 138 136  --  138  --   K 4.1 4.5 4.1  --  4.2  --   CL 103 105 103  --  107  --   CO2 26 26 27   --  25  --   GLUCOSE 116* 117* 97  --  92  --   BUN 32* 31* 26*  --  11  --   CREATININE 0.93 0.95 0.91  --  0.77  --   CALCIUM  7.9* 7.7* 7.6*  --  8.1*  --   AST  --   --   --   --  32  --   ALT  --   --   --   --  32  --   ALKPHOS  --   --   --   --  66  --   BILITOT  --   --   --   --  0.5  --   ALBUMIN  --   --   --   --  2.7*  --   MG  --   --   --   --  2.2  --   INR 2.3* 2.6* 3.2* 2.4* 2.0* 2.3*    ------------------------------------------------------------------------------------------------------------------ No results for input(s): CHOL, HDL, LDLCALC, TRIG, CHOLHDL, LDLDIRECT in the last 72 hours.  No results found for: HGBA1C ------------------------------------------------------------------------------------------------------------------ No results for input(s): TSH, T4TOTAL, T3FREE, THYROIDAB in the last 72 hours.  Invalid input(s): FREET3  Cardiac Enzymes No results for input(s): CKMB, TROPONINI, MYOGLOBIN in the last 168 hours.  Invalid input(s): CK ------------------------------------------------------------------------------------------------------------------    Component Value Date/Time   BNP 165.0 (H) 12/13/2021 0603    CBG: No results for input(s): GLUCAP in the last 168  hours.  Recent Results (from the past 240 hours)  Culture, blood (Routine X 2) w Reflex to ID Panel     Status: None   Collection Time: 06/08/24  8:41 AM   Specimen: BLOOD  Result Value Ref Range Status   Specimen Description   Final    BLOOD LEFT ANTECUBITAL Performed at [facility], [address] [place], [place] [address]    Special Requests   Final    BOTTLES DRAWN AEROBIC AND ANAEROBIC Blood Culture results may not be optimal due to an inadequate volume of blood received in culture bottles Performed at [facility], [address] [place], [place] [address]    Culture   Final    NO GROWTH 5 DAYS Performed at [facility], [address], [place], [place] [address]    Report Status 06/13/2024 FINAL  Final  Culture, blood (Routine X 2) w Reflex to ID Panel     Status: None   Collection Time: 06/08/24  8:43 AM   Specimen: BLOOD  Result Value Ref Range Status   Specimen Description   Final    BLOOD LEFT ANTECUBITAL Performed at [facility], [address] [place], [place] [address]    Special Requests   Final    BOTTLES DRAWN AEROBIC AND ANAEROBIC Blood Culture results may not be optimal due to an inadequate volume of blood received in culture bottles Performed at [facility], [address], [place], [place] [address]    Culture   Final    NO GROWTH 5 DAYS Performed at [facility], [address] [place], [place] [address]    Report Status 06/13/2024 FINAL  Final  Resp panel by RT-PCR (RSV, Flu A&B, Covid) Anterior Nasal Swab     Status: None   Collection  Time: 06/08/24  4:09 PM   Specimen: Anterior Nasal Swab  Result Value Ref Range Status   SARS Coronavirus 2 by RT PCR NEGATIVE NEGATIVE Final    Comment: (NOTE) SARS-CoV-2 target nucleic acids are NOT DETECTED.  The SARS-CoV-2 RNA is generally detectable in upper respiratory specimens during the acute phase of infection.  The lowest concentration of SARS-CoV-2 viral copies this assay can detect is 138 copies/mL. A negative result does not preclude SARS-Cov-2 infection and should not be used as the sole basis for treatment or other patient management decisions. A negative result may occur with  improper specimen collection/handling, submission of specimen other than nasopharyngeal swab, presence of viral mutation(s) within the areas targeted by this assay, and inadequate number of viral copies(<138 copies/mL). A negative result must be combined with clinical observations, patient history, and epidemiological information. The expected result is Negative.  Fact Sheet for Patients:  bloggercourse.com  Fact Sheet for Healthcare Providers:  seriousbroker.it  This test is no t yet approved or cleared by the [place]  FDA and  has been authorized for detection and/or diagnosis of SARS-CoV-2 by FDA under an Emergency Use Authorization (EUA). This EUA will remain  in effect (meaning this test can be used) for the duration of the COVID-19 declaration under Section 564(b)(1) of the Act, 21 U.S.C.section 360bbb-3(b)(1), unless the authorization is terminated  or revoked sooner.       Influenza A by PCR NEGATIVE NEGATIVE Final   Influenza B by PCR NEGATIVE NEGATIVE Final    Comment: (NOTE) The Xpert Xpress SARS-CoV-2/FLU/RSV plus assay is intended as an aid in the diagnosis of influenza from Nasopharyngeal swab specimens and should not be used as a sole basis for treatment. Nasal washings and aspirates are unacceptable for Xpert Xpress SARS-CoV-2/FLU/RSV testing.  Fact Sheet for Patients: bloggercourse.com  Fact Sheet for Healthcare Providers: seriousbroker.it  This test is not yet approved or cleared by the [place]  FDA and has been authorized for detection and/or diagnosis of SARS-CoV-2 by FDA  under an Emergency Use Authorization (EUA). This EUA will remain in effect (meaning this test can be used) for the duration of the COVID-19 declaration under Section 564(b)(1) of the Act, 21 U.S.C. section 360bbb-3(b)(1), unless the authorization is terminated or revoked.     Resp Syncytial Virus by PCR NEGATIVE NEGATIVE Final    Comment: (NOTE) Fact Sheet for Patients: bloggercourse.com  Fact Sheet for Healthcare Providers: seriousbroker.it  This test is not yet approved or cleared by the [place]  FDA and has been authorized for detection and/or diagnosis of SARS-CoV-2 by FDA under an Emergency Use Authorization (EUA). This EUA will remain in effect (meaning this test can be used) for the duration of the COVID-19 declaration under Section 564(b)(1) of the Act, 21 U.S.C. section 360bbb-3(b)(1), unless the authorization is terminated or revoked.  Performed at [facility], [address] [place], [place] [address]      Radiology Studies: No results found.   Nilda Fendt, MD, PhD [facility]  Between 7 am - 7 pm I am available, please contact me via Amion (for emergencies) or Securechat (non urgent messages)  Between 7 pm - 7 am I am not available, please contact night coverage MD/APP via Amion  "

## 2024-06-15 DIAGNOSIS — D62 Acute posthemorrhagic anemia: Secondary | ICD-10-CM | POA: Diagnosis not present

## 2024-06-15 LAB — CBC
HCT: 28.8 % — ABNORMAL LOW (ref 39.0–52.0)
Hemoglobin: 8.8 g/dL — ABNORMAL LOW (ref 13.0–17.0)
MCH: 29.8 pg (ref 26.0–34.0)
MCHC: 30.6 g/dL (ref 30.0–36.0)
MCV: 97.6 fL (ref 80.0–100.0)
Platelets: 299 10*3/uL (ref 150–400)
RBC: 2.95 MIL/uL — ABNORMAL LOW (ref 4.22–5.81)
RDW: 15.4 % (ref 11.5–15.5)
WBC: 8.9 10*3/uL (ref 4.0–10.5)
nRBC: 0 % (ref 0.0–0.2)

## 2024-06-15 LAB — PROTIME-INR
INR: 2.1 — ABNORMAL HIGH (ref 0.8–1.2)
Prothrombin Time: 24.6 s — ABNORMAL HIGH (ref 11.4–15.2)

## 2024-06-15 MED ORDER — OXYCODONE HCL 5 MG PO TABS: 5.0000 mg | ORAL_TABLET | Freq: Four times a day (QID) | ORAL | 0 refills | Status: AC | PRN

## 2024-06-15 MED ORDER — WARFARIN SODIUM 4 MG PO TABS
4.0000 mg | ORAL_TABLET | Freq: Once | ORAL | Status: AC
  Administered 2024-06-15: 4 mg via ORAL
  Filled 2024-06-15: qty 1

## 2024-06-15 NOTE — TOC Progression Note (Addendum)
 Transition of Care [facility]) - Progression Note    Patient Details  Name: Terry [name] MRN: [id number] Date of Birth: 11-15-1935  [facility] CM/SW Contact  Alfonse JONELLE Rex, RN Phone Number: 06/15/2024, 12:25 PM  Clinical Narrative:  Call received from [facility] with Peer to Peer offer for SNF request, due by 4:30pm EST time 06/15/24, Call [phone], opt 5, Attending notified   -4;39pm Peer to Peer completed. SNF request approved. Auth ID [id number], Auth ID [id number], days approved.  06/15/2024-06/17/2024. Team notified, plan for dc tomorrow.                        Expected Discharge Plan and Services                                               Social Drivers of Health (SDOH) Interventions SDOH Screenings   Food Insecurity: No Food Insecurity (06/08/2024)  Housing: Low Risk (06/08/2024)  Transportation Needs: No Transportation Needs (06/08/2024)  Utilities: Not At Risk (06/08/2024)  Social Connections: Unknown (06/08/2024)  Recent Concern: Social Connections - Socially Isolated (05/20/2024)  Tobacco Use: Medium Risk (06/04/2024)    Readmission Risk Interventions     No data to display

## 2024-06-15 NOTE — Plan of Care (Signed)
  Problem: Clinical Measurements: Goal: Ability to maintain clinical measurements within normal limits will improve Outcome: Progressing Goal: Will remain free from infection Outcome: Progressing Goal: Diagnostic test results will improve Outcome: Progressing Goal: Respiratory complications will improve Outcome: Progressing Goal: Cardiovascular complication will be avoided Outcome: Progressing   Problem: Activity: Goal: Risk for activity intolerance will decrease Outcome: Progressing   Problem: Coping: Goal: Level of anxiety will decrease Outcome: Progressing   Problem: Safety: Goal: Ability to remain free from injury will improve Outcome: Progressing   Problem: Skin Integrity: Goal: Risk for impaired skin integrity will decrease Outcome: Progressing

## 2024-06-15 NOTE — Progress Notes (Signed)
 PHARMACY - ANTICOAGULATION CONSULT NOTE  Pharmacy Consult for warfarin Indication: hx mechanical mitral valve  Allergies[1]  Patient Measurements: Height: 5' 11 (180.3 cm) Weight: 85 kg (187 lb 6.3 oz) IBW/kg (Calculated) : 75.3 HEPARIN  DW (KG): 85  Vital Signs: Temp: 98.5 F (36.9 C) (02/03 0635) Temp Source: Oral (02/03 0635) BP: 106/60 (02/03 0635) Pulse Rate: 95 (02/03 0635)  Labs: Recent Labs    06/13/24 0345 06/14/24 0320 06/15/24 0329  HGB 9.1* 9.2* 8.8*  HCT 28.1* 29.8* 28.8*  PLT 244 286 299  LABPROT 23.7* 26.0* 24.6*  INR 2.0* 2.3* 2.1*  CREATININE 0.77  --   --     Estimated Creatinine Clearance: 70.6 mL/min (by C-G formula based on SCr of 0.77 mg/dL).   Medical History: Past Medical History:  Diagnosis Date   Benign prostatic hypertrophy    Dyslipidemia    Hyperlipidemia    Long-term (current) use of anticoagulants    Mitral valve prolapse    Osteoporosis    Ventricular tachycardia (HCC)     Medications:  -  PTA warfarin regimen per [facility] clinic note on 12/29:  5mg  daily except 2.5 mg on MWF for INR goal 2.5-3.5 - He was discharged from [facility] on 1/22 with pharmacist recom. to do 5mg  daily  until follow-up with [facility] clinic or at SNF within next 2-3 days  and to treat with LMWH 85 mg q12h until INR >2.5 - Patient came back to the ED on 1/23 from [facility]  Assessment: Patient is an 89 yo M with hx mechanical mitral valve on warfarin PTA who was hospitalized at [facility] from 05/20/24 to 06/03/24 for left lower extremity hematoma. During this hospitalization, he underwent debridement of hematoma with wound VAC on 05/26/24 and additional debridement on 05/28/24. He was discharged from [facility] to SNF on 06/03/24 with warfarin and LMWH bridge.  He presented back to the ED on 06/04/24 due to bleeding at wound site and generalized weakness.  Warfarin resumed on admission and bridged with heparin  drip from 06/06/24 to 06/11/24.  Significant events: - 1/23: 2 units PRBC - 1/24: 1  units PRBC - 1/26: 1 units PRBC - 1/27: TXA 2000 mg topical x1 - 1/28: 2 units PRBC - 1/30: 2 units PRBC   Today, 06/15/2024: - INR is sub-therapeutic at 2.1 - hgb down slightly to 8.8, plts stable - no significant drug-drug interactions  Goal of Therapy:  INR goal 2.5-3.5 Monitor platelets by anticoagulation protocol: Yes   Plan:  - increase warfarin to 4 mg PO x1 today  - daily INR - monitor for severity of  bleeding at wound site   Breyah Akhter P 06/15/2024,9:09 AM       [1]  Allergies Allergen Reactions   Alendronate Other (See Comments)    GERD and allergic, per MAR   Aspirin Other (See Comments)    Too much bruising when added to Coumadin ; listed as allergic, per MAR   Cipro [Ciprofloxacin Hcl] Other (See Comments)    Heartburn and allergic, per [facility]   Sulfa Drugs Cross Reactors Hives and Other (See Comments)    Allergic, per [facility]

## 2024-06-16 LAB — PROTIME-INR
INR: 2 — ABNORMAL HIGH (ref 0.8–1.2)
Prothrombin Time: 23.6 s — ABNORMAL HIGH (ref 11.4–15.2)

## 2024-06-16 MED ORDER — WARFARIN SODIUM 5 MG PO TABS: 5.0000 mg | ORAL_TABLET | Freq: Once | ORAL | Status: DC

## 2024-06-16 NOTE — Progress Notes (Signed)
 PHARMACY - ANTICOAGULATION CONSULT NOTE  Pharmacy Consult for warfarin Indication: hx mechanical mitral valve  Allergies[1]  Patient Measurements: Height: 5' 11 (180.3 cm) Weight: 85 kg (187 lb 6.3 oz) IBW/kg (Calculated) : 75.3 HEPARIN  DW (KG): 85  Vital Signs: Temp: 97.7 F (36.5 C) (02/04 0523) Temp Source: Oral (02/03 2053) BP: 110/56 (02/04 0523) Pulse Rate: 93 (02/04 0523)  Labs: Recent Labs    06/14/24 0320 06/15/24 0329 06/16/24 0342  HGB 9.2* 8.8*  --   HCT 29.8* 28.8*  --   PLT 286 299  --   LABPROT 26.0* 24.6* 23.6*  INR 2.3* 2.1* 2.0*    Estimated Creatinine Clearance: 70.6 mL/min (by C-G formula based on SCr of 0.77 mg/dL).   Medical History: Past Medical History:  Diagnosis Date   Benign prostatic hypertrophy    Dyslipidemia    Hyperlipidemia    Long-term (current) use of anticoagulants    Mitral valve prolapse    Osteoporosis    Ventricular tachycardia (HCC)     Medications:  -  PTA warfarin regimen per [facility] clinic note on 12/29:  5mg  daily except 2.5 mg on MWF for INR goal 2.5-3.5 - He was discharged from [facility] on 1/22 with pharmacist recom. to do 5mg  daily  until follow-up with [facility] clinic or at [facility] within next 2-3 days  and to treat with LMWH 85 mg q12h until INR >2.5 - Patient came back to the ED on 1/23 from [facility]  Assessment: Patient is an 89 yo M with hx mechanical mitral valve on warfarin PTA who was hospitalized at [facility] from 05/20/24 to 06/03/24 for left lower extremity hematoma. During this hospitalization, he underwent debridement of hematoma with wound VAC on 05/26/24 and additional debridement on 05/28/24. He was discharged from [facility] to SNF on 06/03/24 with warfarin and LMWH bridge.  He presented back to the ED on 06/04/24 due to bleeding at wound site and generalized weakness.  Warfarin resumed on admission and bridged with heparin  drip from 06/06/24 to 06/11/24.  Significant events: - 1/23: 2 units PRBC - 1/24: 1 units PRBC - 1/26: 1 units  PRBC - 1/27: TXA 2000 mg topical x1 - 1/28: 2 units PRBC - 1/30: 2 units PRBC   Today, 06/16/2024: - INR is sub-therapeutic at 2 - no significant drug-drug interactions  Goal of Therapy:  INR goal 2.5-3.5 Monitor platelets by anticoagulation protocol: Yes   Plan:  - increase warfarin to 5 mg PO x1 today  - daily INR - monitor for severity of  bleeding at wound site   Tessah Patchen P 06/16/2024,8:20 AM        [1]  Allergies Allergen Reactions   Alendronate Other (See Comments)    GERD and allergic, per MAR   Aspirin Other (See Comments)    Too much bruising when added to Coumadin ; listed as allergic, per MAR   Cipro [Ciprofloxacin Hcl] Other (See Comments)    Heartburn and allergic, per [facility]   Sulfa Drugs Cross Reactors Hives and Other (See Comments)    Allergic, per [facility]

## 2024-06-16 NOTE — TOC Transition Note (Signed)
 Transition of Care [facility]) - Discharge Note   Patient Details  Name: Terry [name] MRN: [id number] Date of Birth: 01/02/1936  [facility] CM/SW Contact:  Alfonse JONELLE Rex, RN Phone Number: 06/16/2024, 12:47 PM   Clinical Narrative:   DC to SNF-[facility] [place], [address]. Pt notified, states he will notify his family once he arrives at the SNF, [facility] for transport. No further CM needs identified at this time.     Final next level of care: Skilled Nursing Facility Barriers to Discharge: Barriers Resolved   Patient Goals and CMS Choice            Discharge Placement              Patient chooses bed at: [facility] [place] Patient to be transferred to facility by: [facility] Name of family member notified: pt notified family Patient and family notified of of transfer: 06/16/24  Discharge Plan and Services Additional resources added to the After Visit Summary for                                       Social Drivers of Health (SDOH) Interventions SDOH Screenings   Food Insecurity: No Food Insecurity (06/08/2024)  Housing: Low Risk (06/08/2024)  Transportation Needs: No Transportation Needs (06/08/2024)  Utilities: Not At Risk (06/08/2024)  Social Connections: Unknown (06/08/2024)  Recent Concern: Social Connections - Socially Isolated (05/20/2024)  Tobacco Use: Medium Risk (06/04/2024)     Readmission Risk Interventions    06/16/2024   12:46 PM  Readmission Risk Prevention Plan  Transportation Screening Complete  PCP or Specialist Appt within 5-7 Days Complete  Home Care Screening Complete  Medication Review (RN CM) Complete

## 2024-06-16 NOTE — Discharge Summary (Addendum)
 "  Physician Discharge Summary  Terry [name] FMW:[id number] DOB: 12-22-1935 DOA: 06/04/2024  PCP: Shayne Anes, MD  Admit date: 06/04/2024 Discharge date: 06/16/2024  Admitted From: SNF Disposition:  SNF  Recommendations for Outpatient Follow-up:  Follow up with Orthopedics Dr Harden within 1 week Please obtain CBC in 5 days (Feb 9)    Home Health: none Equipment/Devices: none  Discharge Condition: stable CODE STATUS: Full code Diet Orders (From admission, onward)     Start     Ordered   06/04/24 1642  Diet regular Room service appropriate? Yes; Fluid consistency: Thin  Diet effective now       Question Answer Comment  Room service appropriate? Yes   Fluid consistency: Thin      06/04/24 1641           Brief Narrative / Interim history: 89 year old male with history of mechanical mitral valve on Coumadin  with goal INR 2.5-3.5, HTN, HLD, BPH who suffered a traumatic hematoma to the left leg followed by compartment syndrome status post multiple debridements, wound VAC placement, eventually discharged to an SNF.    He returned one day after discharge with weakness, found to have anemia with a hemoglobin ~7.  Orthopedic surgery was consulted and conservative management was recommended    Hospital Course / Discharge diagnoses: Acute blood loss anemia due to left leg wound and chronic anticoagulation with warfarin Left lower extremity wound  Had had a had trauma to the left leg, developed hematoma and compartment syndrome status post multiple debridements and eventually underwent skin grafting during his admission from Jan 8 to Jan 22.    During this admission, his anticoagulation was continued in the setting of mechanical mitral valve.  He did require 8 units of PRBCs total, treated with  TXA 1/27.  Last transfusion 1/30.    With wound care and time, the leg wound improved, and he has not had further bleeding, no transfusions since 1/30 and hemoglobin has remained stable  for the last 5 days.  Stable for discharge. - Check Hgb in 5 days.   Sinus tachycardia, leukocytosis -initially placed empirically on Zosyn  but source is not clear and antibiotics have been discontinued on 1/30.  He has been having soft BPs at times but this is probably due to his problems #1 and #2. He is afebrile, white count has normalized, Zosyn  discontinued on 1/30.  He is stable off antibiotics Mechanical mitral valve, chronic anticoagulation -continue Coumadin  per pharmacy.   Essential hypertension-continue metoprolol , blood pressure is overall acceptable Hyperlipidemia-continue statin  Sepsis ruled out   Discharge Instructions   Allergies as of 06/16/2024       Reactions   Alendronate Other (See Comments)   GERD and allergic, per [facility]   Aspirin Other (See Comments)   Too much bruising when added to Coumadin ; listed as allergic, per [facility]   Cipro [ciprofloxacin Hcl] Other (See Comments)   Heartburn and allergic, per [facility]   Sulfa Drugs [employer], Other (See Comments)   Allergic, per [facility]        Medication List     STOP taking these medications    enoxaparin  100 MG/ML injection Commonly known as: LOVENOX    enoxaparin  80 MG/0.8ML injection Commonly known as: LOVENOX        TAKE these medications    acetaminophen  500 MG tablet Commonly known as: TYLENOL  Take 2 tablets (1,000 mg total) by mouth every 8 (eight) hours.   barrier cream Crea Commonly known as: non-specified Apply 1  Application topically 3 (three) times daily.   dorzolamide -timolol  2-0.5 % ophthalmic solution Commonly known as: COSOPT  Place 1 drop into both eyes in the morning and at bedtime.   lidocaine  5 % Commonly known as: LIDODERM  Place 1 patch onto the skin daily. Remove & Discard patch within 12 hours or as directed by MD What changed:  when to take this additional instructions   metoprolol  succinate 50 MG 24 hr tablet Commonly known as: TOPROL -XL Take 50 mg by mouth  daily.   multivitamin per tablet Take 1 tablet by mouth daily with breakfast.   omeprazole 20 MG capsule Commonly known as: PRILOSEC Take 20 mg by mouth See admin instructions. Take 20 mg by mouth every other morning at 6:30 AM- 30 minutes prior to breakfast   oxyCODONE  5 MG immediate release tablet Commonly known as: Oxy IR/ROXICODONE  Take 1 tablet (5 mg total) by mouth every 6 (six) hours as needed for severe pain (pain score 7-10).   polyethylene glycol 17 g packet Commonly known as: MiraLax  Take 17 g by mouth daily. What changed:  when to take this additional instructions   rosuvastatin  20 MG tablet Commonly known as: CRESTOR  Take 20 mg by mouth at bedtime.   Skin Prep Spray Misc Apply 1 application  topically See admin instructions. Apply to bilateral heels in the morning and evening   Tafluprost  (PF) 0.0015 % Soln Place 1 drop into both eyes at bedtime.   warfarin 5 MG tablet Commonly known as: COUMADIN  Take as directed. If you are unsure how to take this medication, talk to your nurse or doctor. Original instructions: Take 1 tablet (5 mg total) by mouth daily at 4 PM.       Consultations: Orthopedic surgery  Procedures/Studies:  DG CHEST PORT 1 VIEW Result Date: 06/08/2024 EXAM: 1 VIEW(S) XRAY OF THE CHEST 06/08/2024 08:31:00 AM COMPARISON: Chest radiograph 12/09/2021 and CTA abdomen with runoff 05/13/2024. CLINICAL HISTORY: Leukocytosis. FINDINGS: LUNGS AND PLEURA: Overall similar bilateral pulmonary emphysematous and fibrotic changes. Redemonstrated biapical presumed postinflammatory changes. No consolidation. No pleural effusion. No pneumothorax. HEART AND MEDIASTINUM: Mitral annular ring. No acute abnormality of the cardiac and mediastinal silhouettes. BONES AND SOFT TISSUES: Median sternotomy. No acute osseous abnormality. IMPRESSION: 1. No acute findings. Electronically signed by: Prentice Spade MD 06/08/2024 08:40 AM EST RP Workstation: [id number]   CT  ANGIO LOWER EXT BILAT W &/OR WO CONTRAST Result Date: 05/23/2024 EXAM: CTA BILATERAL LOWER EXTREMITY 05/23/2024 08:19:00 PM TECHNIQUE: Contrast-enhanced computed tomography angiography of the lower extremity was performed with multiplanar reconstructions. Maximum intensity projection images were created on a separate workstation and reviewed. Automated exposure control, iterative reconstruction, and/or weight based adjustment of the mA/kV was utilized to reduce the radiation dose to as low as reasonably achievable. 100mL iohexol  (OMNIPAQUE ) 350 MG/ML injection was administered. COMPARISON: None available. CLINICAL HISTORY: Claudication or leg ischemia. FINDINGS: ARTERIAL: Limitations: Distal abdominal aorta, common iliac arteries, and proximal internal and external iliac arteries are excluded from view. Visualized internal iliac arteries are widely patent bilaterally. Right Lower Extremity: RIGHT COMMON ILIAC ARTERY: Not visualized. RIGHT EXTERNAL ILIAC ARTERY: Proximal portion not visualized. Visualized portions of the right external iliac artery are widely patent. COMMON FEMORAL ARTERY: Widely patent. SUPERFICIAL FEMORAL ARTERY: Widely patent. POPLITEAL ARTERY: Widely patent. TIBIOPERONEAL TRUNK: Widely patent. ANTERIOR TIBIAL ARTERY: Widely patent. Flow is identified in the anterior tibial artery to the ankle. The right dorsalis pedis artery is diminutive and patency is not confirmed on this examination. PERONEAL ARTERY: Widely patent. Flow  is identified to the ankle. POSTERIOR TIBIAL ARTERY: Widely patent. Posterior tibial artery flow is present into the hindfoot. The plantar arch is patent. Left Lower Extremity: LEFT COMMON ILIAC ARTERY: Not visualized. LEFT EXTERNAL ILIAC ARTERY: Proximal portion not visualized. Visualized portions of the left external iliac artery are widely patent. COMMON FEMORAL ARTERY: Widely patent. SUPERFICIAL FEMORAL ARTERY: Widely patent. POPLITEAL ARTERY: Widely patent. TIBIOPERONEAL  TRUNK: Widely patent. ANTERIOR TIBIAL ARTERY: Widely patent. Flow is identified in the anterior tibial artery to the ankle. Dorsalis pedis is patent. PERONEAL ARTERY: Widely patent. Flow is identified to the ankle. POSTERIOR TIBIAL ARTERY: Widely patent. Posterior tibial artery flow is present into the hindfoot. Plantar arch is patent. BONES AND SOFT TISSUES: Mild subcutaneous edema noted within the posterolateral subcutaneous soft tissues of the left thigh in keeping with superficial contusion. Edema is seen within the posterior muscular compartment surrounding the flexor musculature and popliteal vasculature without mass-forming hematoma identified in this region. Within the infrapopliteal left lower extremity, there are subcutaneous hematoma seen anterolaterally measuring 2.6 x 7.2 x 14.9 cm (volume 144.5 cm\S\3) and posterolaterally measuring 4.2 x 10.2 x 17.5 cm (volume 388.9 cm\S\3). Hematoma demonstrate marked mass effect upon the superficial posterior and anterior compartment musculature but remained confined to the subcutaneous compartment. Moderate subcutaneous edema noted within the peripheral left lower extremity. Moderate right prepatellar subcutaneous edema. INCIDENTAL ABDOMINAL FINDINGS: Moderate sigmoid diverticulosis. Marked prostatic hypertrophy. IMPRESSION: 1. Right lower extremity arterial inflow is widely patent. 2. Right lower extremity arterial outflow is widely patent. 3. Right lower extremity runoff demonstrates 3-vessel runoff to the ankle with diminutive dorsalis pedis artery and patency not confirmed, and a patent plantar arch. 4. Left lower extremity arterial inflow is widely patent. 5. Left lower extremity arterial outflow is widely patent. 6. Left lower extremity runoff demonstrates 3-vessel runoff to the ankle with patent dorsalis pedis artery and plantar arch, with large infrapopliteal subcutaneous hematomas and associated left lower extremity edema/contusion as described, with  moderate sigmoid diverticulosis and marked prostatic hypertrophy. 7. Multiple hematomas within the left lower extremity as outlined above demonstrating significant mass effect upon the subjacent musculature of the superficial posterior and anterior compartments. Electronically signed by: Dorethia Molt MD MD 05/23/2024 08:53 PM EST RP Workstation: [id number]   CT Angio Aortobifemoral W and/or Wo Contrast Result Date: 05/20/2024 EXAM: CTA ABDOMEN AND PELVIS WITH CONTRAST AND RUNOFF CTA OF THE LOWER EXTREMITIES WITH CONTRAST 05/20/2024 05:13:59 PM TECHNIQUE: CTA images of the abdomen, pelvis and lower extremities with intravenous contrast. 100 mL of iohexol  (OMNIPAQUE ) 350 MG/ML injection was administered. Three-dimensional MIP/volume rendered formations were performed. Automated exposure control, iterative reconstruction, and/or weight based adjustment of the mA/kV was utilized to reduce the radiation dose to as low as reasonably achievable. COMPARISON: CT virtual colonoscopy 11/01/2021. CLINICAL HISTORY: Claudication or leg ischemia; eval hematoma, ?active extrav LLE hematoma, abscess. FINDINGS: VASCULATURE: AORTA: Atherosclerotic calcifications of the aorta. No acute finding. No abdominal aortic aneurysm. No dissection. CELIAC TRUNK: No acute finding. No occlusion or significant stenosis. SUPERIOR MESENTERIC ARTERY: No acute finding. No occlusion or significant stenosis. INFERIOR MESENTERIC ARTERY: No acute finding. No occlusion or significant stenosis. RENAL ARTERIES: No acute finding. No occlusion or significant stenosis. RIGHT ILIAC ARTERIES: Atherosclerotic calcifications of the common iliac arteries. No acute finding. No occlusion or significant stenosis. RIGHT FEMORAL ARTERIES: Atherosclerotic calcifications of the superficial femoral artery. No acute finding. No occlusion or significant stenosis. RIGHT POPLITEAL ARTERY: No acute finding. No occlusion or significant stenosis. RIGHT CALF ARTERIES:  Atherosclerotic calcifications  of calf vessels. No acute finding. No occlusion or significant stenosis. LEFT ILIAC ARTERIES: Atherosclerotic calcifications of the common iliac arteries. No acute finding. No occlusion or significant stenosis. LEFT FEMORAL ARTERIES: Atherosclerotic calcifications of the superficial femoral artery. No acute finding. No occlusion or significant stenosis. LEFT POPLITEAL ARTERY: No acute finding. No occlusion or significant stenosis. LEFT CALF ARTERIES: Atherosclerotic calcifications of calf vessels. No acute finding. No occlusion or significant stenosis. ABDOMEN AND PELVIS: LOWER CHEST: There is a 6 mm right middle lobe nodule. LIVER: The liver is unremarkable. GALLBLADDER AND BILE DUCTS: Gallbladder is unremarkable. No biliary ductal dilatation. SPLEEN: The spleen is unremarkable. PANCREAS: The pancreas is unremarkable. ADRENAL GLANDS: Bilateral adrenal glands demonstrate no acute abnormality. KIDNEYS, URETERS AND BLADDER: No stones in the kidneys or ureters. No hydronephrosis. No evidence of perinephric or periureteral stranding. Urinary bladder is unremarkable. GI AND Bowel: Stomach and duodenal sweep demonstrate no acute abnormality. There is sigmoid colon diverticulosis. The appendix is not visualized. There is no bowel obstruction. No abnormal bowel wall thickening or distension. REPRODUCTIVE: Prostate gland is enlarged measuring 6.9 cm in transverse dimension. PERITONEUM AND RETROPERITONEUM: No ascites or free air. LYMPH NODES: Mildly enlarged left inguinal lymph nodes measuring up to 11 mm. BONES AND SOFT TISSUES: There is edema throughout the bilateral lower extremities below the level of the knees including feet and ankles, greater on the left. There is a subcutaneous collection anterior to the mid tibia measuring 2.9 x 5.9 x 12.7 cm with a hyperdense fluid level. There is a small amount of active extravasation identified in the mid and lower portions of the collection without  a definitive feeder vessel seen. There is no soft tissue gas or foreign body identified. There is no acute osseous abnormality. IMPRESSION: 1. Left anterior mid tibial subcutaneous hematoma with a small focus of active extravasation and no definite feeder vessel identified. 2. Edema throughout the bilateral lower extremities below the level of the knees, greater on the left. 3. Atherosclerotic calcifications of the aorta, common iliac arteries, calf vessels, and superficial femoral artery bilaterally. 4. Solid 6 mm right middle lobe pulmonary nodule, recommend noncontrast chest CT at 6-12 months per Fleischner Society Guidelines. Electronically signed by: Greig Pique MD MD 05/20/2024 06:04 PM EST RP Workstation: [id number]     Subjective: - no chest pain, shortness of breath, no abdominal pain, nausea or vomiting.   Discharge Exam: BP (!) 110/56 (BP Location: Left Arm)   Pulse 93   Temp 97.7 F (36.5 C)   Resp 16   Ht 5' 11 (1.803 m)   Wt 85 kg   SpO2 96%   BMI 26.14 kg/m   General: Pt is alert, awake, not in acute distress Cardiovascular: RRR no murmurs, no edema Respiratory: CTA bilaterally, no wheezing, no rhonchi Abdominal: Soft, NT, ND, bowel sounds + Extremities: no edema, no cyanosis, left DP pulse normal   The results of significant diagnostics from this hospitalization (including imaging, microbiology, ancillary and laboratory) are listed below for reference.     Microbiology: Recent Results (from the past 240 hours)  Culture, blood (Routine X 2) w Reflex to ID Panel     Status: None   Collection Time: 06/08/24  8:41 AM   Specimen: BLOOD  Result Value Ref Range Status   Specimen Description   Final    BLOOD LEFT ANTECUBITAL Performed at [facility], [address], [place], [place] [address]    Special Requests   Final    BOTTLES  DRAWN AEROBIC AND ANAEROBIC Blood Culture results may not be optimal due to an inadequate volume of blood received in  culture bottles Performed at [facility], [address], [place], [place] [address]    Culture   Final    NO GROWTH 5 DAYS Performed at [facility], [address], [place], [place] [address]    Report Status 06/13/2024 FINAL  Final  Culture, blood (Routine X 2) w Reflex to ID Panel     Status: None   Collection Time: 06/08/24  8:43 AM   Specimen: BLOOD  Result Value Ref Range Status   Specimen Description   Final    BLOOD LEFT ANTECUBITAL Performed at [facility], [address], [place], [place] [address]    Special Requests   Final    BOTTLES DRAWN AEROBIC AND ANAEROBIC Blood Culture results may not be optimal due to an inadequate volume of blood received in culture bottles Performed at [facility], [address] [place], [place] [address]    Culture   Final    NO GROWTH 5 DAYS Performed at [facility], [address] [place], [place] [address]    Report Status 06/13/2024 FINAL  Final  Resp panel by RT-PCR (RSV, Flu A&B, Covid) Anterior Nasal Swab     Status: None   Collection Time: 06/08/24  4:09 PM   Specimen: Anterior Nasal Swab  Result Value Ref Range Status   SARS Coronavirus 2 by RT PCR NEGATIVE NEGATIVE Final    Comment: (NOTE) SARS-CoV-2 target nucleic acids are NOT DETECTED.  The SARS-CoV-2 RNA is generally detectable in upper respiratory specimens during the acute phase of infection. The lowest concentration of SARS-CoV-2 viral copies this assay can detect is 138 copies/mL. A negative result does not preclude SARS-Cov-2 infection and should not be used as the sole basis for treatment or other patient management decisions. A negative result may occur with  improper specimen collection/handling, submission of specimen other than nasopharyngeal swab, presence of viral mutation(s) within the areas targeted by this assay, and inadequate number of viral copies(<138 copies/mL). A negative  result must be combined with clinical observations, patient history, and epidemiological information. The expected result is Negative.  Fact Sheet for Patients:  bloggercourse.com  Fact Sheet for Healthcare Providers:  seriousbroker.it  This test is no t yet approved or cleared by the [place]  FDA and  has been authorized for detection and/or diagnosis of SARS-CoV-2 by FDA under an Emergency Use Authorization (EUA). This EUA will remain  in effect (meaning this test can be used) for the duration of the COVID-19 declaration under Section 564(b)(1) of the Act, 21 U.S.C.section 360bbb-3(b)(1), unless the authorization is terminated  or revoked sooner.       Influenza A by PCR NEGATIVE NEGATIVE Final   Influenza B by PCR NEGATIVE NEGATIVE Final    Comment: (NOTE) The Xpert Xpress SARS-CoV-2/FLU/RSV plus assay is intended as an aid in the diagnosis of influenza from Nasopharyngeal swab specimens and should not be used as a sole basis for treatment. Nasal washings and aspirates are unacceptable for Xpert Xpress SARS-CoV-2/FLU/RSV testing.  Fact Sheet for Patients: bloggercourse.com  Fact Sheet for Healthcare Providers: seriousbroker.it  This test is not yet approved or cleared by the [place]  FDA and has been authorized for detection and/or diagnosis of SARS-CoV-2 by FDA under an Emergency Use Authorization (EUA). This EUA will remain in effect (meaning this test can be used)  for the duration of the COVID-19 declaration under Section 564(b)(1) of the Act, 21 U.S.C. section 360bbb-3(b)(1), unless the authorization is terminated or revoked.     Resp Syncytial Virus by PCR NEGATIVE NEGATIVE Final    Comment: (NOTE) Fact Sheet for Patients: bloggercourse.com  Fact Sheet for Healthcare Providers: seriousbroker.it  This  test is not yet approved or cleared by the [place]  FDA and has been authorized for detection and/or diagnosis of SARS-CoV-2 by FDA under an Emergency Use Authorization (EUA). This EUA will remain in effect (meaning this test can be used) for the duration of the COVID-19 declaration under Section 564(b)(1) of the Act, 21 U.S.C. section 360bbb-3(b)(1), unless the authorization is terminated or revoked.  Performed at [facility], [address], [place], [place] [address]      Labs: Basic Metabolic Panel: Recent Labs  Lab 06/10/24 0533 06/11/24 0352 06/13/24 0345  NA 138 136 138  K 4.5 4.1 4.2  CL 105 103 107  CO2 26 27 25   GLUCOSE 117* 97 92  BUN 31* 26* 11  CREATININE 0.95 0.91 0.77  CALCIUM  7.7* 7.6* 8.1*  MG  --   --  2.2   Liver Function Tests: Recent Labs  Lab 06/13/24 0345  AST 32  ALT 32  ALKPHOS 66  BILITOT 0.5  PROT 4.7*  ALBUMIN 2.7*   CBC: Recent Labs  Lab 06/11/24 0352 06/11/24 1635 06/12/24 0824 06/13/24 0345 06/14/24 0320 06/15/24 0329  WBC 9.5  --  7.7 7.6 8.3 8.9  HGB 7.4* 9.2* 9.1* 9.1* 9.2* 8.8*  HCT 22.7* 27.7* 29.0* 28.1* 29.8* 28.8*  MCV 96.6  --  97.6 94.9 98.3 97.6  PLT 212  --  227 244 286 299   CBG: No results for input(s): GLUCAP in the last 168 hours. Hgb A1c No results for input(s): HGBA1C in the last 72 hours. Lipid Profile No results for input(s): CHOL, HDL, LDLCALC, TRIG, CHOLHDL, LDLDIRECT in the last 72 hours. Thyroid function studies No results for input(s): TSH, T4TOTAL, T3FREE, THYROIDAB in the last 72 hours.  Invalid input(s): FREET3 Urinalysis    Component Value Date/Time   COLORURINE AMBER (A) 06/08/2024 2022   APPEARANCEUR CLOUDY (A) 06/08/2024 2022   LABSPEC 1.025 06/08/2024 2022   PHURINE 5.0 06/08/2024 2022   GLUCOSEU NEGATIVE 06/08/2024 2022   HGBUR NEGATIVE 06/08/2024 2022   BILIRUBINUR NEGATIVE 06/08/2024 2022   KETONESUR NEGATIVE 06/08/2024 2022    PROTEINUR NEGATIVE 06/08/2024 2022   NITRITE NEGATIVE 06/08/2024 2022   LEUKOCYTESUR NEGATIVE 06/08/2024 2022    FURTHER DISCHARGE INSTRUCTIONS:   Get Medicines reviewed and adjusted: Please take all your medications with you for your next visit with your Primary MD   Laboratory/radiological data: Please request your Primary MD to go over all hospital tests and procedure/radiological results at the follow up, please ask your Primary MD to get all Hospital records sent to his/her office.   In some cases, they will be blood work, cultures and biopsy results pending at the time of your discharge. Please request that your primary care M.D. goes through all the records of your hospital data and follows up on these results.   Also Note the following: If you experience worsening of your admission symptoms, develop shortness of breath, life threatening emergency, suicidal or homicidal thoughts you must seek medical attention immediately by calling 911 or calling your MD immediately  if symptoms less severe.   You must read complete instructions/literature along with all the possible adverse  reactions/side effects for all the Medicines you take and that have been prescribed to you. Take any new Medicines after you have completely understood and accpet all the possible adverse reactions/side effects.    Do not drive when taking Pain medications or sleeping medications (Benzodaizepines)   Do not take more than prescribed Pain, Sleep and Anxiety Medications. It is not advisable to combine anxiety,sleep and pain medications without talking with your primary care practitioner   Special Instructions: If you have smoked or chewed Tobacco  in the last 2 yrs please stop smoking, stop any regular Alcohol  and or any Recreational drug use.   Wear Seat belts while driving.   Please note: You were cared for by a hospitalist during your hospital stay. Once you are discharged, your primary care physician  will handle any further medical issues. Please note that NO REFILLS for any discharge medications will be authorized once you are discharged, as it is imperative that you return to your primary care physician (or establish a relationship with a primary care physician if you do not have one) for your post hospital discharge needs so that they can reassess your need for medications and monitor your lab values.  Time coordinating discharge: 25 minutes  SIGNED:  Lonni SHAUNNA Dalton, MD 06/16/2024, 10:26 AM   "

## 2024-06-16 NOTE — Care Management Important Message (Signed)
 Important Message  Patient Details IM Letter given. Name: DORSE LOCY MRN: [id number] Date of Birth: November 11, 1935   Important Message Given:  Yes - Medicare IM     Melba Ates 06/16/2024, 2:08 PM

## 2024-06-21 ENCOUNTER — Encounter: Admitting: Physician Assistant
